# Patient Record
Sex: Female | Born: 1950 | Race: White | Hispanic: No | Marital: Married | Smoking: Former smoker
Health system: Southern US, Community
[De-identification: ages and names within clinical notes are randomized; demographics above are authoritative.]

## PROBLEM LIST (undated history)

## (undated) DIAGNOSIS — T4145XA Adverse effect of unspecified anesthetic, initial encounter: Secondary | ICD-10-CM

## (undated) DIAGNOSIS — F419 Anxiety disorder, unspecified: Secondary | ICD-10-CM

## (undated) DIAGNOSIS — M7580 Other shoulder lesions, unspecified shoulder: Secondary | ICD-10-CM

## (undated) DIAGNOSIS — F32A Depression, unspecified: Secondary | ICD-10-CM

## (undated) DIAGNOSIS — M1990 Unspecified osteoarthritis, unspecified site: Secondary | ICD-10-CM

## (undated) DIAGNOSIS — N6019 Diffuse cystic mastopathy of unspecified breast: Secondary | ICD-10-CM

## (undated) DIAGNOSIS — G4730 Sleep apnea, unspecified: Secondary | ICD-10-CM

## (undated) DIAGNOSIS — H269 Unspecified cataract: Secondary | ICD-10-CM

## (undated) DIAGNOSIS — F329 Major depressive disorder, single episode, unspecified: Secondary | ICD-10-CM

## (undated) DIAGNOSIS — G4733 Obstructive sleep apnea (adult) (pediatric): Secondary | ICD-10-CM

## (undated) DIAGNOSIS — T7840XA Allergy, unspecified, initial encounter: Secondary | ICD-10-CM

## (undated) DIAGNOSIS — T8859XA Other complications of anesthesia, initial encounter: Secondary | ICD-10-CM

## (undated) DIAGNOSIS — K279 Peptic ulcer, site unspecified, unspecified as acute or chronic, without hemorrhage or perforation: Secondary | ICD-10-CM

## (undated) DIAGNOSIS — D649 Anemia, unspecified: Secondary | ICD-10-CM

## (undated) DIAGNOSIS — K219 Gastro-esophageal reflux disease without esophagitis: Secondary | ICD-10-CM

## (undated) DIAGNOSIS — M2669 Other specified disorders of temporomandibular joint: Secondary | ICD-10-CM

## (undated) HISTORY — DX: Unspecified osteoarthritis, unspecified site: M19.90

## (undated) HISTORY — PX: EYE SURGERY: SHX253

## (undated) HISTORY — PX: HAMMER TOE SURGERY: SHX385

## (undated) HISTORY — DX: Peptic ulcer, site unspecified, unspecified as acute or chronic, without hemorrhage or perforation: K27.9

## (undated) HISTORY — PX: SHOULDER SURGERY: SHX246

## (undated) HISTORY — DX: Unspecified cataract: H26.9

## (undated) HISTORY — DX: Major depressive disorder, single episode, unspecified: F32.9

## (undated) HISTORY — PX: DILATION AND CURETTAGE OF UTERUS: SHX78

## (undated) HISTORY — DX: Allergy, unspecified, initial encounter: T78.40XA

## (undated) HISTORY — PX: PUBOVAGINAL SLING: SHX1035

## (undated) HISTORY — DX: Anxiety disorder, unspecified: F41.9

## (undated) HISTORY — PX: WRIST SURGERY: SHX841

## (undated) HISTORY — DX: Other specified disorders of temporomandibular joint: M26.69

## (undated) HISTORY — DX: Obstructive sleep apnea (adult) (pediatric): G47.33

## (undated) HISTORY — PX: ADENOIDECTOMY: SUR15

## (undated) HISTORY — DX: Diffuse cystic mastopathy of unspecified breast: N60.19

## (undated) HISTORY — DX: Sleep apnea, unspecified: G47.30

## (undated) HISTORY — DX: Gastro-esophageal reflux disease without esophagitis: K21.9

## (undated) HISTORY — PX: TONSILLECTOMY: SUR1361

## (undated) HISTORY — PX: TUBAL LIGATION: SHX77

## (undated) HISTORY — DX: Depression, unspecified: F32.A

## (undated) HISTORY — PX: BREAST CYST ASPIRATION: SHX578

## (undated) HISTORY — DX: Other shoulder lesions, unspecified shoulder: M75.80

---

## 1982-04-21 HISTORY — PX: RHINOPLASTY: SUR1284

## 1995-04-22 HISTORY — PX: TURBINATE REDUCTION: SHX6157

## 2001-02-05 ENCOUNTER — Other Ambulatory Visit: Admission: RE | Admit: 2001-02-05 | Discharge: 2001-02-05 | Payer: Self-pay | Admitting: *Deleted

## 2001-04-01 ENCOUNTER — Ambulatory Visit (HOSPITAL_COMMUNITY): Admission: RE | Admit: 2001-04-01 | Discharge: 2001-04-01 | Payer: Self-pay | Admitting: *Deleted

## 2001-04-01 ENCOUNTER — Encounter (INDEPENDENT_AMBULATORY_CARE_PROVIDER_SITE_OTHER): Payer: Self-pay

## 2001-04-21 HISTORY — PX: ABDOMINAL HYSTERECTOMY: SHX81

## 2001-05-19 ENCOUNTER — Ambulatory Visit (HOSPITAL_COMMUNITY): Admission: RE | Admit: 2001-05-19 | Discharge: 2001-05-19 | Payer: Self-pay | Admitting: *Deleted

## 2001-05-19 ENCOUNTER — Encounter: Payer: Self-pay | Admitting: *Deleted

## 2001-10-27 ENCOUNTER — Observation Stay (HOSPITAL_COMMUNITY): Admission: RE | Admit: 2001-10-27 | Discharge: 2001-10-28 | Payer: Self-pay | Admitting: Obstetrics and Gynecology

## 2001-10-27 ENCOUNTER — Encounter (INDEPENDENT_AMBULATORY_CARE_PROVIDER_SITE_OTHER): Payer: Self-pay

## 2002-05-25 ENCOUNTER — Encounter: Payer: Self-pay | Admitting: Obstetrics and Gynecology

## 2002-05-25 ENCOUNTER — Ambulatory Visit (HOSPITAL_COMMUNITY): Admission: RE | Admit: 2002-05-25 | Discharge: 2002-05-25 | Payer: Self-pay | Admitting: Obstetrics and Gynecology

## 2003-01-29 ENCOUNTER — Emergency Department (HOSPITAL_COMMUNITY): Admission: EM | Admit: 2003-01-29 | Discharge: 2003-01-29 | Payer: Self-pay | Admitting: Emergency Medicine

## 2003-01-29 ENCOUNTER — Encounter: Payer: Self-pay | Admitting: Emergency Medicine

## 2003-04-05 ENCOUNTER — Encounter: Payer: Self-pay | Admitting: Gastroenterology

## 2003-05-29 ENCOUNTER — Ambulatory Visit (HOSPITAL_COMMUNITY): Admission: RE | Admit: 2003-05-29 | Discharge: 2003-05-29 | Payer: Self-pay | Admitting: Obstetrics and Gynecology

## 2003-06-02 ENCOUNTER — Encounter: Admission: RE | Admit: 2003-06-02 | Discharge: 2003-06-02 | Payer: Self-pay | Admitting: Obstetrics and Gynecology

## 2004-06-25 ENCOUNTER — Encounter: Admission: RE | Admit: 2004-06-25 | Discharge: 2004-06-25 | Payer: Self-pay | Admitting: Obstetrics and Gynecology

## 2004-12-09 ENCOUNTER — Encounter: Payer: Self-pay | Admitting: Internal Medicine

## 2005-01-20 ENCOUNTER — Encounter: Payer: Self-pay | Admitting: Internal Medicine

## 2005-03-11 ENCOUNTER — Encounter: Payer: Self-pay | Admitting: Internal Medicine

## 2005-05-05 ENCOUNTER — Encounter: Payer: Self-pay | Admitting: Internal Medicine

## 2005-05-09 ENCOUNTER — Encounter: Payer: Self-pay | Admitting: Internal Medicine

## 2005-05-26 ENCOUNTER — Encounter: Payer: Self-pay | Admitting: Internal Medicine

## 2005-10-06 ENCOUNTER — Encounter: Payer: Self-pay | Admitting: Internal Medicine

## 2006-02-17 ENCOUNTER — Encounter: Payer: Self-pay | Admitting: Internal Medicine

## 2006-07-31 ENCOUNTER — Encounter: Payer: Self-pay | Admitting: Internal Medicine

## 2006-08-04 ENCOUNTER — Encounter: Payer: Self-pay | Admitting: Internal Medicine

## 2006-08-06 ENCOUNTER — Encounter: Payer: Self-pay | Admitting: Internal Medicine

## 2006-08-19 ENCOUNTER — Encounter: Payer: Self-pay | Admitting: Internal Medicine

## 2006-08-26 ENCOUNTER — Encounter: Payer: Self-pay | Admitting: Internal Medicine

## 2006-10-22 ENCOUNTER — Encounter: Payer: Self-pay | Admitting: Internal Medicine

## 2006-12-25 ENCOUNTER — Ambulatory Visit: Payer: Self-pay | Admitting: Internal Medicine

## 2007-03-01 ENCOUNTER — Encounter: Payer: Self-pay | Admitting: Internal Medicine

## 2007-03-03 ENCOUNTER — Encounter: Payer: Self-pay | Admitting: Internal Medicine

## 2007-03-10 HISTORY — PX: COLONOSCOPY: SHX174

## 2007-04-05 ENCOUNTER — Encounter: Payer: Self-pay | Admitting: Internal Medicine

## 2007-04-12 ENCOUNTER — Encounter: Payer: Self-pay | Admitting: Internal Medicine

## 2007-05-07 ENCOUNTER — Encounter: Payer: Self-pay | Admitting: Internal Medicine

## 2007-05-13 ENCOUNTER — Encounter: Payer: Self-pay | Admitting: Internal Medicine

## 2007-06-09 ENCOUNTER — Encounter: Admission: RE | Admit: 2007-06-09 | Discharge: 2007-06-09 | Payer: Self-pay | Admitting: Obstetrics and Gynecology

## 2007-07-12 ENCOUNTER — Telehealth (INDEPENDENT_AMBULATORY_CARE_PROVIDER_SITE_OTHER): Payer: Self-pay | Admitting: *Deleted

## 2007-07-12 ENCOUNTER — Ambulatory Visit: Payer: Self-pay | Admitting: Endocrinology

## 2007-07-12 DIAGNOSIS — B029 Zoster without complications: Secondary | ICD-10-CM | POA: Insufficient documentation

## 2007-09-21 ENCOUNTER — Telehealth (INDEPENDENT_AMBULATORY_CARE_PROVIDER_SITE_OTHER): Payer: Self-pay | Admitting: *Deleted

## 2007-10-05 ENCOUNTER — Ambulatory Visit: Payer: Self-pay | Admitting: Internal Medicine

## 2007-10-05 DIAGNOSIS — F32A Depression, unspecified: Secondary | ICD-10-CM | POA: Insufficient documentation

## 2007-10-05 DIAGNOSIS — K279 Peptic ulcer, site unspecified, unspecified as acute or chronic, without hemorrhage or perforation: Secondary | ICD-10-CM | POA: Insufficient documentation

## 2007-10-05 DIAGNOSIS — F329 Major depressive disorder, single episode, unspecified: Secondary | ICD-10-CM | POA: Insufficient documentation

## 2007-10-05 DIAGNOSIS — J45909 Unspecified asthma, uncomplicated: Secondary | ICD-10-CM | POA: Insufficient documentation

## 2007-10-05 DIAGNOSIS — J301 Allergic rhinitis due to pollen: Secondary | ICD-10-CM | POA: Insufficient documentation

## 2007-10-05 DIAGNOSIS — F419 Anxiety disorder, unspecified: Secondary | ICD-10-CM | POA: Insufficient documentation

## 2007-12-26 ENCOUNTER — Encounter: Payer: Self-pay | Admitting: Internal Medicine

## 2008-06-12 ENCOUNTER — Encounter: Admission: RE | Admit: 2008-06-12 | Discharge: 2008-06-12 | Payer: Self-pay | Admitting: Obstetrics and Gynecology

## 2008-08-16 ENCOUNTER — Ambulatory Visit: Payer: Self-pay | Admitting: Internal Medicine

## 2008-08-23 ENCOUNTER — Telehealth (INDEPENDENT_AMBULATORY_CARE_PROVIDER_SITE_OTHER): Payer: Self-pay | Admitting: *Deleted

## 2008-08-24 ENCOUNTER — Ambulatory Visit: Payer: Self-pay | Admitting: Internal Medicine

## 2009-01-23 ENCOUNTER — Encounter: Payer: Self-pay | Admitting: Internal Medicine

## 2009-02-23 ENCOUNTER — Ambulatory Visit: Payer: Self-pay | Admitting: Internal Medicine

## 2009-02-23 DIAGNOSIS — G44219 Episodic tension-type headache, not intractable: Secondary | ICD-10-CM | POA: Insufficient documentation

## 2009-03-19 ENCOUNTER — Ambulatory Visit: Payer: Self-pay | Admitting: Internal Medicine

## 2009-03-26 ENCOUNTER — Ambulatory Visit: Payer: Self-pay | Admitting: Internal Medicine

## 2009-06-14 ENCOUNTER — Encounter: Admission: RE | Admit: 2009-06-14 | Discharge: 2009-06-14 | Payer: Self-pay | Admitting: Obstetrics and Gynecology

## 2009-06-16 ENCOUNTER — Encounter: Admission: RE | Admit: 2009-06-16 | Discharge: 2009-06-16 | Payer: Self-pay | Admitting: Orthopaedic Surgery

## 2009-06-20 ENCOUNTER — Encounter: Admission: RE | Admit: 2009-06-20 | Discharge: 2009-06-20 | Payer: Self-pay | Admitting: Orthopaedic Surgery

## 2009-10-10 ENCOUNTER — Telehealth (INDEPENDENT_AMBULATORY_CARE_PROVIDER_SITE_OTHER): Payer: Self-pay | Admitting: *Deleted

## 2009-10-31 ENCOUNTER — Ambulatory Visit: Payer: Self-pay | Admitting: Internal Medicine

## 2009-10-31 LAB — CONVERTED CEMR LAB
ALT: 21 units/L (ref 0–35)
Albumin: 4.1 g/dL (ref 3.5–5.2)
Basophils Absolute: 0 10*3/uL (ref 0.0–0.1)
Bilirubin Urine: NEGATIVE
Bilirubin, Direct: 0.1 mg/dL (ref 0.0–0.3)
CO2: 30 meq/L (ref 19–32)
Chloride: 105 meq/L (ref 96–112)
Creatinine, Ser: 0.7 mg/dL (ref 0.4–1.2)
Eosinophils Relative: 2 % (ref 0.0–5.0)
Glucose, Bld: 98 mg/dL (ref 70–99)
Hemoglobin, Urine: NEGATIVE
Iron: 50 ug/dL (ref 42–145)
Ketones, ur: NEGATIVE mg/dL
LDL Cholesterol: 133 mg/dL — ABNORMAL HIGH (ref 0–99)
Leukocytes, UA: NEGATIVE
MCV: 79.9 fL (ref 78.0–100.0)
Monocytes Relative: 7.4 % (ref 3.0–12.0)
Neutro Abs: 3.2 10*3/uL (ref 1.4–7.7)
Neutrophils Relative %: 58.6 % (ref 43.0–77.0)
Nitrite: NEGATIVE
Platelets: 285 10*3/uL (ref 150.0–400.0)
Potassium: 4.5 meq/L (ref 3.5–5.1)
Sodium: 143 meq/L (ref 135–145)
TSH: 2.07 microintl units/mL (ref 0.35–5.50)
Total Bilirubin: 0.7 mg/dL (ref 0.3–1.2)
Total CHOL/HDL Ratio: 5
Total Protein, Urine: NEGATIVE mg/dL
Transferrin: 293.9 mg/dL (ref 212.0–360.0)
Urine Glucose: NEGATIVE mg/dL
Urobilinogen, UA: 0.2 (ref 0.0–1.0)
VLDL: 24 mg/dL (ref 0.0–40.0)
Vitamin B-12: 557 pg/mL (ref 211–911)
WBC: 5.5 10*3/uL (ref 4.5–10.5)

## 2009-11-01 ENCOUNTER — Encounter: Payer: Self-pay | Admitting: Internal Medicine

## 2009-11-01 LAB — CONVERTED CEMR LAB: Retic Ct Pct: 1.1 % (ref 0.4–3.1)

## 2009-11-05 ENCOUNTER — Encounter: Payer: Self-pay | Admitting: Internal Medicine

## 2009-12-25 ENCOUNTER — Telehealth: Payer: Self-pay | Admitting: Internal Medicine

## 2010-01-10 ENCOUNTER — Ambulatory Visit: Payer: Self-pay | Admitting: Internal Medicine

## 2010-01-10 DIAGNOSIS — D649 Anemia, unspecified: Secondary | ICD-10-CM | POA: Insufficient documentation

## 2010-04-21 HISTORY — PX: ROTATOR CUFF REPAIR: SHX139

## 2010-05-20 ENCOUNTER — Other Ambulatory Visit: Payer: Self-pay | Admitting: Obstetrics & Gynecology

## 2010-05-20 DIAGNOSIS — Z1239 Encounter for other screening for malignant neoplasm of breast: Secondary | ICD-10-CM

## 2010-05-21 NOTE — Assessment & Plan Note (Signed)
Summary: PHYSICAL---STC   Vital Signs:  Patient profile:   60 year old female Height:      68 inches Weight:      195 pounds BMI:     29.76 O2 Sat:      98 % on Room air Temp:     98.5 degrees F oral Pulse rate:   70 / minute BP sitting:   128 / 88  (left arm) Cuff size:   large  Vitals Entered By: Bill Salinas CMA (January 10, 2010 1:50 PM)  O2 Flow:  Room air CC: cpx/ ab Comments Pt will get flu shot today/ ab   Primary Care Provider:  Norins  CC:  cpx/ ab.  History of Present Illness: Patient presents for general medical follow-up. She is having a problem with alopecia.   She recently lost her mother to small-cell cancer lung. Diagnosed in 04/05/23 and died in December 06, 2022. This has been very hard to bear.  She is current with her gynecologist and is up to date with mammograms and bone density studies. She has chronic anemia currently with hGB 11.4, % Fe sat of 14%. She is current with colonoscopy with last study in '08. she has intermittently taken iron but finds it constipating.   Current Medications (verified): 1)  Multivitamins   Tabs (Multiple Vitamin) .... Take 1 Tablet By Mouth Once A Day 2)  Caltrate 600+d 600-400 Mg-Unit  Tabs (Calcium Carbonate-Vitamin D) .... Take 1 Tablet By Mouth Once A Day 3)  Fluticasone Propionate 50 Mcg/act  Susp (Fluticasone Propionate) .Marland Kitchen.. 1 Spray/nares Once Daily 4)  Effexor Xr 75 Mg Xr24h-Cap (Venlafaxine Hcl) .... Take 3 By Mouth Qd 5)  Allegra 60 Mg Tabs (Fexofenadine Hcl) .Marland Kitchen.. 1 Tab Daily 6)  Ambien Cr 12.5 Mg Cr-Tabs (Zolpidem Tartrate) .Marland Kitchen.. 1 Tablet At Bedtime As Needed  Allergies (verified): 1)  ! Pcn  Past History:  Past Medical History: Last updated: 10/05/2007 UCD Allergic rhinitis Asthma Peptic ulcer disease shoulder spurs - left w/ chronic pain, w/ limitation ROM Anxiety/depression fibrocystic breast disease    Physician Roster:               Gyn - Dickstein               Ortho  - Whifield                GI -  Medoff               Psych - Plovsky, therapist - Ms. Newman Pies  Past Surgical History: Last updated: 10/05/2007 Tonsillectomy x 2 Adenoidectomy  x 3 Hysterectomy '03 due to fibroids Tubal ligation Pubo-vaginal sling for incontinence '90's Turbinate reductions '97 Rhinoplasty '84   G6 P2 2 SAB,2 TAB (one late due to [facility])  Family History: Last updated: 10/05/2007 father-1925: CAD, Lipids, Prostate cancer, testicular cancer, lipids mother - 1932: AVR '94, osteoporosis, fibroids Neg- breast cancer, colon cancer PGF, MGM - DM MGF, PGM - CAD  Social History: Last updated: 03/19/2009 [employer] 2 years married '82 2 daughters - '86, '92 work: Designer, 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    Physical Exam  General:  WNWD white female in no physical distress but emotionally distraught Head:  normocephalic, atraumatic, and no abnormalities observed.   Eyes:  vision grossly intact, pupils equal, pupils round, corneas and lenses clear, and no injection.   Ears:  External ear exam shows no significant lesions or deformities.  Otoscopic examination reveals clear canals, tympanic membranes are intact bilaterally without bulging, retraction, inflammation or discharge. Hearing is grossly normal bilaterally. Nose:  no external deformity and no external erythema.   Mouth:  Oral mucosa and oropharynx without lesions or exudates.  Teeth  in good repair. Neck:  supple, full ROM, no thyromegaly, and no carotid bruits.   Chest Wall:  no deformities.   Breasts:  deferred to gyn Lungs:  Normal respiratory effort, chest expands symmetrically. Lungs are clear to auscultation, no crackles or wheezes. Heart:  Normal rate and regular rhythm. S1 and S2 normal without gallop, murmur, click, rub or other extra sounds. Abdomen:  soft, non-tender, and normal bowel sounds.   Genitalia:  deferred to gyn Msk:  normal ROM, no joint tenderness, no joint swelling, no joint warmth, and no joint deformities.   Pulses:  2+ radial and DP pulses Extremities:  No clubbing, cyanosis, edema, or deformity noted with normal full range of motion of all joints.   Neurologic:  alert & oriented X3, cranial nerves II-XII intact, gait normal, and DTRs symmetrical and normal.   Skin:  turgor normal, color normal, and no suspicious lesions.   Cervical Nodes:  no anterior cervical adenopathy and no posterior cervical adenopathy.   Psych:  Oriented X3, memory intact for recent and remote, normally interactive, good eye contact, and tearful.     Impression & Recommendations:  Problem # 1:  ANXIETY DEPRESSION (ICD-300.4) Patient has recently lost her mother and was her primary care-give through a difficult illness and death. She is in the throes of a normal grief reaction. She reports that she is not seeing a therapist. She is continuing to take Effexor.  Plan - grief and loss counseling. She will contact me if she needs help finding a therapist to help her.   Problem # 2:  ASTHMA (ICD-493.90) [place].  Problem # 3:  ANEMIA (ICD-285.9) Mild anemia with Hgb 11.4. Retic count is normal. B12 is normal. Total iron is normal at 50 but percent iron saturation is low at 12.2% with normal being greater than 20%. She had a normal colonsocopy in '08  Plan - iron rich diet - provided reference to [email or web address].  Problem # 4:  Preventive Health Care  (ICD-V70.0) Current with her gynecologist. Her limited exam is normal. Lab results are normal except for mild anemia and and LDL 3 points above goal of 130. Cardiac risk calculator gives a 10 year risk of cardicac event of 2%. Current with colonoscopy and mammography. Immunizations are brought up to date.  In summary - a very nice woman who appears generally healthy. We did discuss weight management as a critical factor in her health. Reveiwed the principles of smart food choices, PORTION SIZE CONTROL, regular exercise. Set a target weight of 160 labs and a goal of loosing 1 lb/month. "Weight Watchers" is endorsed if she prefers a more structured but similar approach.  She will contact me if assistance is needed in locating a grief and loss counselor. She will otherwise return as needed or 1 year.   Complete Medication List: 1)  Multivitamins Tabs (  Multiple vitamin) .... Take 1 tablet by mouth once a day 2)  Caltrate 600+d 600-400 Mg-unit Tabs (Calcium carbonate-vitamin d) .... Take 1 tablet by mouth once a day 3)  Fluticasone Propionate 50 Mcg/act Susp (Fluticasone propionate) .Marland Kitchen.. 1 spray/nares once daily 4)  Effexor Xr 75 Mg Xr24h-cap (Venlafaxine hcl) .... Take 3 by mouth qd 5)  Allegra 60 Mg Tabs (Fexofenadine hcl) .Marland Kitchen.. 1 tab daily 6)  Ambien Cr 12.5 Mg Cr-tabs (Zolpidem tartrate) .Marland Kitchen.. 1 tablet at bedtime as needed  Other Orders: Admin 1st Vaccine (04540) Flu Vaccine 54yrs + (98119)   Patient: Chelsey Jackson Note: All result statuses are Final unless otherwise noted.  Tests: (1) BMP (METABOL)   Sodium                    143 mEq/L                   135-145   Potassium                 4.5 mEq/L                   3.5-5.1   Chloride                  105 mEq/L                   96-112   Carbon Dioxide            30 mEq/L                    19-32   Glucose                   98 mg/dL                    14-78   BUN                       21 mg/dL                    2-95   Creatinine                 0.7 mg/dL                   6.2-1.3   Calcium                   9.0 mg/dL                   0.8-65.7   GFR                       87.37 mL/min                >60  Tests: (2) Lipid Panel (LIPID)   Cholesterol               196 mg/dL                   8-469     ATP III Classification            Desirable:  < 200 mg/dL                    Borderline High:  200 - 239 mg/dL  High:  > = 240 mg/dL   Triglycerides             120.0 mg/dL                 4.7-829.5     Normal:  <150 mg/dL     Borderline High:  621 - 199 mg/dL   HDL                       30.86 mg/dL                 >57.84   VLDL Cholesterol          24.0 mg/dL                  6.9-62.9   LDL Cholesterol      [H]  528 mg/dL                   4-13  CHO/HDL Ratio:  CHD Risk                             5                    Men          Women     1/2 Average Risk     3.4          3.3     Average Risk          5.0          4.4     2X Average Risk          9.6          7.1     3X Average Risk          15.0          11.0                           Tests: (3) CBC Platelet w/Diff (CBCD)   White Cell Count          5.5 K/uL                    4.5-10.5   Red Cell Count            4.20 Mil/uL                 3.87-5.11   Hemoglobin           [L]  11.4 g/dL                   24.4-01.0   Hematocrit           [L]  33.5 %                      36.0-46.0   MCV                       79.9 fl                     78.0-100.0   MCHC                      34.0 g/dL  30.0-36.0   RDW                  [H]  14.8 %                      11.5-14.6   Platelet Count            285.0 K/uL                  150.0-400.0   Neutrophil %              58.6 %                      43.0-77.0   Lymphocyte %              31.8 %                      12.0-46.0   Monocyte %                7.4 %                       3.0-12.0   Eosinophils%              2.0 %                       0.0-5.0   Basophils %               0.2 %                        0.0-3.0   Neutrophill Absolute      3.2 K/uL                    1.4-7.7   Lymphocyte Absolute       1.7 K/uL                    0.7-4.0   Monocyte Absolute         0.4 K/uL                    0.1-1.0  Eosinophils, Absolute                             0.1 K/uL                    0.0-0.7   Basophils Absolute        0.0 K/uL                    0.0-0.1  Tests: (4) Hepatic/Liver Function Panel (HEPATIC)   Total Bilirubin           0.7 mg/dL                   1.6-1.0   Direct Bilirubin          0.1 mg/dL                   9.6-0.4   Alkaline Phosphatase      45 U/L                      39-117   AST  21 U/L                      0-37   ALT                       21 U/L                      0-35   Total Protein             6.7 g/dL                    8.1-1.9   Albumin                   4.1 g/dL                    1.4-7.8  Tests: (5) TSH (TSH)   FastTSH                   2.07 uIU/mL                 0.35-5.50  Tests: (6) UDip Only (UDIP)   Color                     YELLOW       RANGE:  Yellow;Lt. Yellow   Clarity                   CLEAR                       Clear   Specific Gravity          1.025                       1.000 - 1.030   Urine Ph                  6.5                         5.0-8.0   Protein                   NEGATIVE                    Negative   Urine Glucose             NEGATIVE                    Negative   Ketones                   NEGATIVE                    Negative   Urine Bilirubin           NEGATIVE                    Negative   Blood                     NEGATIVE                    Negative   Urobilinogen              0.2  0.0 - 1.0   Leukocyte Esterace        NEGATIVE                    Negative   Nitrite                   NEGATIVE                    Negative  Tests: (7) T4, Free (FT4R)   Free T4                   0.89 ng/dL                  0.60-1.60  Tests: (8) T3 Uptake (T3UP)   T3 Uptake                 36.4 %                       22.5-37.0  Tests: (9) IBC Panel (IBC)   Iron                      50 ug/dL                    16-109   Transferrin               293.9 mg/dL                 604.5-409.8   Iron Saturation      [L]  12.2 %                      20.0-50.0  Tests: (10) B12 Serum - Total ONLY (B12)   Vitamin B12               557 pg/mL                   211-911 Tests: (1) Reticulocyte Count (RETIC) (10050)   % Retic                   1.1 %                       0.4-3.1   RBC                       4.29 MIL/uL                 3.87-5.11   ABS Retic                 47.2 K/uL                   19.0-186.0 Prevention & Chronic Care Immunizations   Influenza vaccine: Fluvax 3+  (01/10/2010)    Tetanus booster: 03/26/2009: Tdap    Pneumococcal vaccine: Not documented  Colorectal Screening   Hemoccult: Not documented    Colonoscopy: Normal  (03/10/2007)  Other Screening   Pap smear: Normal  (05/07/2007)    Mammogram: Normal Bilateral  (06/09/2007)   Smoking status: never  (08/16/2008)  Lipids   Total Cholesterol: 196  (10/31/2009)   LDL: 133  (10/31/2009)   LDL Direct: Not documented   HDL: 39.10  (10/31/2009)   Triglycerides: 120.0  (10/31/2009)  Flu Vaccine Consent Questions     Do you have a history of  severe allergic reactions to this vaccine? no    Any prior history of allergic reactions to egg and/or gelatin? no    Do you have a sensitivity to the preservative Thimersol? no    Do you have a past history of Guillan-Barre Syndrome? no    Do you currently have an acute febrile illness? no    Have you ever had a severe reaction to latex? no    Vaccine information given and explained to patient? yes    Are you currently pregnant? no    Lot Number:[id number]   Exp Date:10/19/2010   Site Given  Left Deltoid IM.lbflu

## 2010-05-21 NOTE — Progress Notes (Signed)
Summary: LABS?   Phone Note Call from Patient Call back at Home Phone [phone] Call back at [facility] VM ON HM #   Summary of Call: Patient is requesting to know if she needs labs?  Initial call taken by: Lamar Sprinkles, CMA,  December 25, 2009 3:06 PM  Follow-up for Phone Call        Just had labs in July. She was supposed to have CPX in July. Was sent a letter about lab results. She was to reschedule cpx Follow-up by: Jacques Navy MD,  December 25, 2009 5:42 PM  Additional Follow-up for Phone Call Additional follow up Details #1::        Pt is scheduled for office visit 9/22 - will need to keep apt, no labs prior.  Additional Follow-up by: Lamar Sprinkles, CMA,  December 25, 2009 5:44 PM    Additional Follow-up for Phone Call Additional follow up Details #2::    Pt informed  Follow-up by: Lamar Sprinkles, CMA,  December 27, 2009 10:08 AM

## 2010-05-21 NOTE — Letter (Signed)
[facility] [address] [place], [place]  [address] Phone: [phone]      November 05, 2009   Chelsey Jackson [address] [place], [place] [address]  RE:  LAB RESULTS  Dear  Chelsey Jackson,  The following is an interpretation of your most recent lab tests.  Please take note of any instructions provided or changes to medications that have resulted from your lab work.  ELECTROLYTES:  Good - no changes needed  KIDNEY FUNCTION TESTS:  Good - no changes needed  LIVER FUNCTION TESTS:  Good - no changes needed  LIPID PANEL:  Stable - no changes needed Triglyceride: 120.0   Cholesterol: 196   LDL: 133   HDL: 39.10   Chol/HDL%:  5  THYROID STUDIES:  Thyroid studies normal TSH: 2.07     DIABETIC STUDIES:  Excellent - no changes needed Blood Glucose: 98    CBC:  Stable - no changes needed  B12 was normal. Retic count is normal.   Lab results look OK. I hope that you will reschedule your appointment for your medical exam.  Call or e-mail me if you have questions ([email or web address]).   Sincerely Yours,    Jacques Navy MD  Patient: Chelsey Jackson Note: All result statuses are Final unless otherwise noted.  Tests: (1) BMP (METABOL)   Sodium                    143 mEq/L                   135-145   Potassium                 4.5 mEq/L                   3.5-5.1   Chloride                  105 mEq/L                   96-112   Carbon Dioxide            30 mEq/L                    19-32   Glucose                   98 mg/dL                    46-96   BUN                       21 mg/dL                    2-95   Creatinine                0.7 mg/dL                   2.8-4.1   Calcium                   9.0 mg/dL                   3.2-44.0   GFR                       87.37 mL/min                >  60  Tests: (2) Lipid Panel (LIPID)   Cholesterol               196 mg/dL                   1-610     ATP III Classification            Desirable:  < 200 mg/dL                     Borderline High:  200 - 239 mg/dL               High:  > = 240 mg/dL   Triglycerides             120.0 mg/dL                 9.6-045.4     Normal:  <150 mg/dL     Borderline High:  098 - 199 mg/dL   HDL                       11.91 mg/dL                 >47.82   VLDL Cholesterol          24.0 mg/dL                  9.5-62.1   LDL Cholesterol      [H]  308 mg/dL                   6-57  CHO/HDL Ratio:  CHD Risk                             5                    Men          Women     1/2 Average Risk     3.4          3.3     Average Risk          5.0          4.4     2X Average Risk          9.6          7.1     3X Average Risk          15.0          11.0                           Tests: (3) CBC Platelet w/Diff (CBCD)   White Cell Count          5.5 K/uL                    4.5-10.5   Red Cell Count            4.20 Mil/uL                 3.87-5.11   Hemoglobin           [L]  11.4 g/dL                   84.6-96.2   Hematocrit           [L]  33.5 %  36.0-46.0   MCV                       79.9 fl                     78.0-100.0   MCHC                      34.0 g/dL                   19.1-47.8   RDW                  [H]  14.8 %                      11.5-14.6   Platelet Count            285.0 K/uL                  150.0-400.0   Neutrophil %              58.6 %                      43.0-77.0   Lymphocyte %              31.8 %                      12.0-46.0   Monocyte %                7.4 %                       3.0-12.0   Eosinophils%              2.0 %                       0.0-5.0   Basophils %               0.2 %                       0.0-3.0   Neutrophill Absolute      3.2 K/uL                    1.4-7.7   Lymphocyte Absolute       1.7 K/uL                    0.7-4.0   Monocyte Absolute         0.4 K/uL                    0.1-1.0  Eosinophils, Absolute                             0.1 K/uL                    0.0-0.7   Basophils Absolute        0.0 K/uL                     0.0-0.1  Tests: (4) Hepatic/Liver Function Panel (HEPATIC)   Total Bilirubin           0.7 mg/dL  0.3-1.2   Direct Bilirubin          0.1 mg/dL                   8.2-9.5   Alkaline Phosphatase      45 U/L                      39-117   AST                       21 U/L                      0-37   ALT                       21 U/L                      0-35   Total Protein             6.7 g/dL                    6.2-1.3   Albumin                   4.1 g/dL                    0.8-6.5  Tests: (5) TSH (TSH)   FastTSH                   2.07 uIU/mL                 0.35-5.50  Tests: (6) UDip Only (UDIP)   Color                     YELLOW       RANGE:  Yellow;Lt. Yellow   Clarity                   CLEAR                       Clear   Specific Gravity          1.025                       1.000 - 1.030   Urine Ph                  6.5                         5.0-8.0   Protein                   NEGATIVE                    Negative   Urine Glucose             NEGATIVE                    Negative   Ketones                   NEGATIVE                    Negative   Urine Bilirubin           NEGATIVE  Negative   Blood                     NEGATIVE                    Negative   Urobilinogen              0.2                         0.0 - 1.0   Leukocyte Esterace        NEGATIVE                    Negative   Nitrite                   NEGATIVE                    Negative  Tests: (7) T4, Free (FT4R)   Free T4                   0.89 ng/dL                  0.60-1.60  Tests: (8) T3 Uptake (T3UP)   T3 Uptake                 36.4 %                      22.5-37.0  Tests: (9) IBC Panel (IBC)   Iron                      50 ug/dL                    09-811   Transferrin               293.9 mg/dL                 914.7-829.5   Iron Saturation      [L]  12.2 %                      20.0-50.0  Tests: (10) B12 Serum - Total ONLY (B12)   Vitamin B12               557  pg/mL                   211-911 Tests: (1) Reticulocyte Count (RETIC) (10050)   % Retic                   1.1 %                       0.4-3.1   RBC                       4.29 MIL/uL                 3.87-5.11   ABS Retic                 47.2 K/uL                   19.0-186.0

## 2010-05-21 NOTE — Progress Notes (Signed)
Summary: Low iron/CPX needed  Phone Note Call from Patient Call back at Work Phone [phone]   Summary of Call: Spoke with patient and she has been spending quite some time with her mother and is afraid that some family issues are related to her issues, Patient also notes that she has a terribly low iron level per the [facility] and is due for physical. Patient was advised to schedule CPX and transferred to scheduling. Initial call taken by: Lucious Groves,  October 10, 2009 10:06 AM  Follow-up for Phone Call        Pt requesting labs "full" thyroid bloodwork, will CPX labs include all thyroid labs? Pt coming in July for CPX and labs, please advise. Follow-up by: Verdell Face,  October 10, 2009 10:08 AM  Additional Follow-up for Phone Call Additional follow up Details #1::        routine cpx labs plus: iron panel, B12, retic count 285.9; FT4, T3U - 780.9 Thanks Additional Follow-up by: Jacques Navy MD,  October 10, 2009 10:24 AM    Additional Follow-up for Phone Call Additional follow up Details #2::    labs stated above added to lab orders in IDX. Follow-up by: Verdell Face,  October 10, 2009 10:52 AM

## 2010-06-13 ENCOUNTER — Encounter: Payer: Self-pay | Admitting: Internal Medicine

## 2010-06-20 ENCOUNTER — Ambulatory Visit
Admission: RE | Admit: 2010-06-20 | Discharge: 2010-06-20 | Disposition: A | Discharge status: Home / Self Care | Payer: BC Managed Care – PPO | Source: Ambulatory Visit | Attending: Obstetrics & Gynecology | Admitting: Obstetrics & Gynecology

## 2010-06-20 DIAGNOSIS — Z1239 Encounter for other screening for malignant neoplasm of breast: Secondary | ICD-10-CM

## 2010-07-02 NOTE — Letter (Signed)
Summary: [facility]  [facility]   Imported By: Sherian Rein 06/25/2010 14:24:44  _____________________________________________________________________  External Attachment:    Type:   Image     Comment:   External Document

## 2010-07-15 ENCOUNTER — Ambulatory Visit (INDEPENDENT_AMBULATORY_CARE_PROVIDER_SITE_OTHER)
Admission: RE | Admit: 2010-07-15 | Discharge: 2010-07-15 | Disposition: A | Discharge status: Home / Self Care | Payer: BC Managed Care – PPO | Source: Ambulatory Visit | Attending: Internal Medicine | Admitting: Internal Medicine

## 2010-07-15 ENCOUNTER — Ambulatory Visit (INDEPENDENT_AMBULATORY_CARE_PROVIDER_SITE_OTHER): Payer: BC Managed Care – PPO | Admitting: Internal Medicine

## 2010-07-15 ENCOUNTER — Encounter: Payer: Self-pay | Admitting: Internal Medicine

## 2010-07-15 VITALS — BP 110/72 | HR 86 | Temp 97.9°F

## 2010-07-15 DIAGNOSIS — J209 Acute bronchitis, unspecified: Secondary | ICD-10-CM

## 2010-07-15 DIAGNOSIS — R05 Cough: Secondary | ICD-10-CM

## 2010-07-15 DIAGNOSIS — R059 Cough, unspecified: Secondary | ICD-10-CM

## 2010-07-15 MED ORDER — PSEUDOEPH-CHLORPHEN-HYDROCOD 60-4-5 MG/5ML PO SOLN: 5.0000 mL | Freq: Four times a day (QID) | ORAL | Status: DC | PRN

## 2010-07-15 MED ORDER — MOXIFLOXACIN HCL 400 MG PO TABS: 400.0000 mg | ORAL_TABLET | Freq: Every day | ORAL | Status: AC

## 2010-07-15 NOTE — Progress Notes (Signed)
Subjective:     Chelsey Jackson is a 60 y.o. female who presents for evaluation of symptoms of a URI, possible sinusitis. Symptoms include congestion, facial pain, low grade fever, productive cough with  yellow colored sputum, purulent nasal discharge, sinus pressure and sore throat. Onset of symptoms was 4 days ago, and has been unchanged since that time. Treatment to date: nasal steroids.  The following portions of the patient's history were reviewed and updated as appropriate: allergies, current medications, past family history, past medical history, past social history, past surgical history and problem list.  Review of Systems Constitutional: positive for chills, fevers and malaise, negative for night sweats and weight loss Ears, nose, mouth, throat, and face: positive for nasal congestion and sore mouth, negative for ear drainage, earaches, epistaxis, facial trauma, hearing loss, hoarseness and tinnitus Respiratory: positive for cough, negative for dyspnea on exertion, hemoptysis, pleurisy/chest pain, stridor and wheezing   Objective:    BP 110/72  Pulse 86  Temp(Src) 97.9 F (36.6 C) (Oral)  SpO2 97%  General Appearance:    Alert, cooperative, no distress, appears stated age  Head:    Normocephalic, without obvious abnormality, atraumatic  Eyes:    PERRL, conjunctiva/corneas clear, EOM's intact, fundi    benign, both eyes  Ears:    Normal TM's and external ear canals, both ears  Nose:   Nares normal, septum midline, mucosa normal, no drainage    or sinus tenderness  Throat:   Lips, mucosa, and tongue normal; teeth and gums normal  Neck:   Supple, symmetrical, trachea midline, no adenopathy;    thyroid:  no enlargement/tenderness/nodules; no carotid   bruit or JVD  Back:     Symmetric, no curvature, ROM normal, no CVA tenderness  Lungs:     Clear to auscultation bilaterally, respirations unlabored  Chest Wall:    No tenderness or deformity   Heart:    Regular rate and rhythm,  S1 and S2 normal, no murmur, rub   or gallop  Breast Exam:    No tenderness, masses, or nipple abnormality  Abdomen:     Soft, non-tender, bowel sounds active all four quadrants,    no masses, no organomegaly        Extremities:   Extremities normal, atraumatic, no cyanosis or edema  Pulses:   2+ and symmetric all extremities  Skin:   Skin color, texture, turgor normal, no rashes or lesions  Lymph nodes:   Cervical, supraclavicular, and axillary nodes normal  Neurologic:   CNII-XII intact, normal strength, sensation and reflexes    throughout     Assessment:    bronchitis and viral upper respiratory illness   Plan:  Start avelox, control symptoms, get rest, report any new or worsening symptoms

## 2010-07-15 NOTE — Patient Instructions (Signed)
Acute Bronchitis You have acute bronchitis. This means you have a chest cold. The airways in your lungs are inflamed (red and sore). Acute means it is sudden onset. Bronchitis is most often caused by a virus. In smokers, people with chronic lung problems, and elderly patients, treatment with antibiotics for bacterial infection may be needed. Exposure to cigarette smoke or irritating chemicals will make bronchitis worse. Allergies and asthma can also make bronchitis worse. Repeated episodes of bronchitis may cause long standing lung problems. Acute bronchitis is usually treated with rest, fluids, and medicines for relief of fever or cough. Bronchodilator medicines from metered inhalers or a nebulizer may be used to help open up the small airways. This reduces shortness of breath and helps control cough. Antibiotics can be prescribed if you are more seriously ill or at risk. A cool air vaporizer may help thin bronchial secretions and make it easier to clear your chest. Increased fluids may also help. You must avoid smoking, even second hand exposure. If you are a cigarette smoker, consider using nicotine gum or skin patches to help control withdrawal symptoms. Recovery from bronchitis is often slow, but you should start feeling better after 2-3 days. Cough from bronchitis frequently lasts for 3-4 weeks.  SEEK IMMEDIATE MEDICAL CARE IF YOU DEVELOP:  Increased fever, chills, or chest pain.   Severe shortness of breath or bloody sputum.   Dehydration, fainting, repeated vomiting, severe headache.   No improvement after one week of proper treatment.  MAKE SURE YOU:   Understand these instructions.   Will watch your condition.   Will get help right away if you are not doing well or get worse.  Document Released: 05/15/2004 Document Re-Released: 03/20/2008 [employer] Patient Information 2011 [employer]. 

## 2010-08-02 ENCOUNTER — Telehealth: Payer: Self-pay | Admitting: *Deleted

## 2010-08-02 DIAGNOSIS — J069 Acute upper respiratory infection, unspecified: Secondary | ICD-10-CM

## 2010-08-02 MED ORDER — MOXIFLOXACIN HCL 400 MG PO TABS: 400.0000 mg | ORAL_TABLET | Freq: Every day | ORAL | Status: AC

## 2010-08-02 NOTE — Telephone Encounter (Signed)
Pt was given abx recently and scheduled f/u ov next week. However, she feels that she is "slipping back" into getting sick again and is afraid to wait thru the weekend w/o additional antibiotics.

## 2010-08-02 NOTE — Telephone Encounter (Signed)
Pt aware of rx

## 2010-08-02 NOTE — Telephone Encounter (Signed)
done

## 2010-08-08 ENCOUNTER — Other Ambulatory Visit (INDEPENDENT_AMBULATORY_CARE_PROVIDER_SITE_OTHER): Payer: BC Managed Care – PPO

## 2010-08-08 ENCOUNTER — Ambulatory Visit (INDEPENDENT_AMBULATORY_CARE_PROVIDER_SITE_OTHER): Payer: BC Managed Care – PPO | Admitting: Internal Medicine

## 2010-08-08 VITALS — BP 110/72 | HR 79 | Temp 98.0°F

## 2010-08-08 DIAGNOSIS — R197 Diarrhea, unspecified: Secondary | ICD-10-CM

## 2010-08-08 LAB — CBC WITH DIFFERENTIAL/PLATELET
Basophils Relative: 0.1 % (ref 0.0–3.0)
HCT: 36.8 % (ref 36.0–46.0)
Hemoglobin: 12.6 g/dL (ref 12.0–15.0)
Monocytes Relative: 7.9 % (ref 3.0–12.0)
Neutro Abs: 2.9 10*3/uL (ref 1.4–7.7)
WBC: 4.6 10*3/uL (ref 4.5–10.5)

## 2010-08-08 NOTE — Progress Notes (Signed)
  Subjective:    Patient ID: Chelsey Jackson, female    DOB: 1950/11/02, 60 y.o.   MRN: [id number]  HPIPatient recently seen by Dr. Yetta Barre and diagnosed with bronchitis after negative chest x-ray. Notes and x-ray reviewed. She was treated with avelox 400mg  qd x 10, cod syrup. She felt better but then started to feel worse. Another round of avelox was called in but she continues to feel bad: feels feverish, has a cough, generalized sense of illness. ON questioning admits to loose watery stools, sometimes with mucus and mild abdominal pain.   PMH, FamHx and SocHx reviewed for any changes and relevance.     Review of Systems     Objective:   Physical Exam WNWD white female in no distress HEENT - TMs normal, throat clear. Chest - CTAP, no egophony or petriloquy, no wheezing or rales. Abd - hypoactive BS, tender to deep palpation LLQ.       Assessment & Plan:  1. Bronchitis - resolved. 2. Post-infectious cought  Plan - continue cough syrup and soothing things  3. Diarrhea - concern for anti-biotic related c.diff.  Plan - CBC, stool for C. Diff toxin. If positive - Flagyl.

## 2010-08-08 NOTE — Patient Instructions (Signed)
Respiratory infection - no evidence of on-going respiratory infection: Ears, throat and lungs all normal. No large lymph-nodes. Stop the Avelox. OK to use cough syrup of choice for a post-infectious cough. Also soothing things: hot teas, cough drops, etc.  Stool change, fever and abdominal tenderness all raise concern for possible anti-biotic associated colon infection = C. Diff colitis Plan - lab work and stool analysis. If positive for c. Diff will call in Rx for Flagyl.  Clostridium Difficile Diarrhea (CDD)   Clostridium difficile (C. diff) is a bacteria found in the intestinal tract (bowel). Under certain conditions, it causes diarrhea and sometimes severe disease. The severe form of the disease is known as pseudomembranous colitis (often called C.diff colitis).   CAUSES Your colon normally has many different bacteria, including C. diff. The balance of bacteria in your colon can change during illness. This is especially true when you take antibiotics. Taking antibiotics may allow the C. diff to grow, multiply excessively and make a toxin that then causes the illness.   SYMPTOMS  Watery diarrhea.   Fever.   Loss of appetite.   Nausea.   Cramping.   Abdominal pain/tenderness.   Sudden need to have a bowel movement.   Bloody diarrhea.  DIAGNOSIS   Your description of diarrhea may make your caregiver suspicious of a C. diff illness. This is especially true if you have used antibiotics in the preceding weeks. However, there are many causes of diarrhea, and treatments differ greatly. There are only 2 ways to know whether or not you have C.diff or C.diff colitis. Your caregiver will help determine if these tests are necessary:  A laboratory test that finds the toxin in your stool.   A specific appearance of an abnormality (pseudomembrane) in your colon. This can only be seen by doing a sigmoidoscopy or colonoscopy (an instrument is passed through your rectum to look at the inside of your  colon).  PREVENTION   C.diff and C.diff colitis are almost always related to antibiotic use. Careful hand washing by you and care providers is important to prevent the spread of infection. You will notice in the hospital that your caregivers may also put on gowns and gloves to prevent the spread of the C. diff bacteria. In addition, your room is regularly cleaned with a hospital grade disinfectant.   PROGNOSIS   Most people with C.diff recover with the right treatment. Relapses following treatment do occur.   This can be a deadly disease if untreated and if you also have other severe diseases at the same time.   RISKS AND COMPLICATIONS   Dehydration and electrolyte imbalance can occur with ongoing diarrhea. Rarely, the colon can develop a hole (perforate) with this disease. A condition called toxic megacolon is a very severe complication.   TREATMENT  Most people are successfully treated with 1 of 2 specific antibiotics usually given by mouth. Other antibiotics you are receiving are stopped if possible.   IV fluids and correction of electrolyte imbalance may be necessary.   Medications to slow down or stop the diarrhea are not used.   If this illness becomes persistent or very recurrent, treatment can be difficult. Rarely, bacteria from a healthy donor is inserted into the colon for treatment.  HOME CARE INSTRUCTIONS  Drink plenty of fluids. AVOID milk, caffeine, and alcohol.   Eat a BRAT diet to try to slow the diarrhea (banana, rice, applesauce, toast). Small, frequent meals may be better than large meals.   Take antibiotic medication  as prescribed. DO NOT use medications to slow the diarrhea. This could delay healing or cause complications.   Wash your hands thoroughly after using the toilet.  SEEK MEDICAL CARE IF:  Diarrhea persists longer than expected or recurs after completing your course of antibiotic treatment.   You have trouble staying hydrated.  SEEK IMMEDIATE MEDICAL CARE  IF:  You develop a new fever.   You develop increasing abdominal pain or tenderness.   You develop blood in the stool, or the stool is dark black and tar-like.   You cannot hold down food or liquids.  MAKE SURE YOU:    Understand these instructions.   Will watch your condition.   Will get help right away if you are not doing well or get worse.  Document Released: 01/15/2005 Document Re-Released: 08/24/2008 [facility] Patient Information 2011 [place], [place].

## 2010-08-09 ENCOUNTER — Telehealth: Payer: Self-pay | Admitting: Internal Medicine

## 2010-08-09 NOTE — Telephone Encounter (Signed)
Called pt and informed her of results. Pt states she feel much better since stopping the antibiotic. She has not done a stool specimen because she has been unable to use the restroom

## 2010-08-09 NOTE — Telephone Encounter (Signed)
Please call patient - CBC is normal, no evidence of active respiratory infection. Stool for c. Diff is still pending. I hope she feels better.

## 2010-08-12 ENCOUNTER — Other Ambulatory Visit: Payer: BC Managed Care – PPO

## 2010-08-12 DIAGNOSIS — R197 Diarrhea, unspecified: Secondary | ICD-10-CM

## 2010-08-16 ENCOUNTER — Telehealth: Payer: Self-pay | Admitting: Internal Medicine

## 2010-08-16 DIAGNOSIS — R197 Diarrhea, unspecified: Secondary | ICD-10-CM

## 2010-08-16 NOTE — Telephone Encounter (Signed)
Please call patient - stool negative for c. Diff.

## 2010-08-19 NOTE — Telephone Encounter (Signed)
Pt states she still has diarrhea, she is going to the bathroom 3 to 4 times a day. She states she gets bouts of chill and feels very fatigued.

## 2010-08-20 ENCOUNTER — Ambulatory Visit (INDEPENDENT_AMBULATORY_CARE_PROVIDER_SITE_OTHER): Payer: BC Managed Care – PPO | Admitting: Internal Medicine

## 2010-08-20 ENCOUNTER — Other Ambulatory Visit: Payer: BC Managed Care – PPO

## 2010-08-20 VITALS — BP 110/72 | HR 67 | Temp 98.7°F

## 2010-08-20 DIAGNOSIS — R197 Diarrhea, unspecified: Secondary | ICD-10-CM

## 2010-08-20 MED ORDER — METRONIDAZOLE 500 MG PO TABS: 500.0000 mg | ORAL_TABLET | Freq: Three times a day (TID) | ORAL | Status: AC

## 2010-08-20 NOTE — Telephone Encounter (Signed)
Repeat stool culutre for C.Diff.

## 2010-08-20 NOTE — Progress Notes (Signed)
  Subjective:    Patient ID: Chelsey Jackson, female    DOB: 1950-08-10, 60 y.o.   MRN: [id number]  HPI Chelsey Jackson continues to have 3-4 loose, greenish stools per day. She has a heightened gastro-colic reflex and subsequently has cut way back on eating. She is have light-headness that comes in waves and she is having sweats. No documented fever, no significant abdominal pain. NO  Blood or mucus in the stool. She had one stool specimen negative for c.diff. She did have two courses of Avelox prior to diarrhea.  I have reviewed the patient's medical history in detail and updated the computerized patient record.  Review of Systems Review of Systems  Constitutional:  Negative for fever, chills, activity change and unexpected weight change.  HENT:  Negative for hearing loss, ear pain, congestion, neck stiffness and postnasal drip.   Eyes: Negative for pain, discharge and visual disturbance.  Respiratory: Negative for chest tightness and wheezing.   Cardiovascular: Negative for chest pain and palpitations.       [No decreased exercise tolerance Gastrointestinal: [No change in bowel habit. No bloating or gas. No reflux or indigestion Genitourinary: Negative for urgency, frequency, flank pain and difficulty urinating.  Musculoskeletal: Negative for myalgias, back pain, arthralgias and gait problem.  Neurological: Negative for dizziness, tremors, weakness and headaches.  Hematological: Negative for adenopathy.  Psychiatric/Behavioral: Negative for behavioral problems and dysphoric mood.      Objective:   Physical Exam WNWD white woman in no acute distress Orthostatic vitals: 118/78 supine; 116/80 sitting; 116/80 standing Abdomen - soft, no guarding or rebound, no marked tenderness        Assessment & Plan:  1. Diarrhea - persistent diarrhea. Working diagnosis remains c. Diff despite a single negative stool.  Plan - 2nd stool culture for d.diff           Flagyl 500mg  tid x 10 days       Probiotics.

## 2010-08-26 ENCOUNTER — Other Ambulatory Visit: Payer: BC Managed Care – PPO

## 2010-08-26 DIAGNOSIS — R197 Diarrhea, unspecified: Secondary | ICD-10-CM

## 2010-08-30 LAB — CLOSTRIDIUM DIFFICILE EIA: CDIFTX: NEGATIVE

## 2010-09-02 ENCOUNTER — Telehealth: Payer: Self-pay | Admitting: *Deleted

## 2010-09-02 MED ORDER — DIPHENOXYLATE-ATROPINE 2.5-0.025 MG PO TABS: 1.0000 | ORAL_TABLET | Freq: Four times a day (QID) | ORAL | Status: AC | PRN

## 2010-09-02 NOTE — Telephone Encounter (Signed)
Both negative for C.diff. May start taking lomotil 1 after each loose stool with limit of 4 doses in 24 hrs. Rx sent in to pharmacy

## 2010-09-02 NOTE — Telephone Encounter (Signed)
Patient requesting results of second stool culture. She continues to have "bowel issues". Results are neg correct?

## 2010-09-02 NOTE — Telephone Encounter (Signed)
Called in RX, Patient informed

## 2010-09-06 NOTE — Op Note (Signed)
[facility]  Patient:    Chelsey Jackson, Chelsey Jackson Visit Number: [id number] MRN: [id number]          Service Type: DSU Location: [facility] Attending Physician:  Ardeen Fillers Dictated by:   Sung Amabile. Roslyn Smiling, M.D. Proc. Date: 04/01/01 Admit Date:  04/01/2001                             Operative Report  INDICATIONS:                  A 60 year old woman with significant menorrhagia and endometrial mass on ultrasound admitted for operative hysteroscopy.  PREOPERATIVE DIAGNOSES:       Menorrhagia, endometrial mass on ultrasound.  POSTOPERATIVE DIAGNOSES:      Menorrhagia, endometrial mass.  PROCEDURE:                    Operative hysteroscopy and dilatation and curettage.  SURGEON:                      Sung Amabile. Roslyn Smiling, M.D.  ANESTHESIA:                   General anesthesia via LMA and paracervical block.  ESTIMATED BLOOD LOSS:         50 cc.  TUBES AND DRAINS:             None.  COMPLICATIONS:                None.  FINDINGS:                     Uterus top normal size, anteverted, sounded to 10 cm.  Posterior wall broad based endometrial mass, resected.  SPECIMEN:                     Hysteroscopic endometrial biopsies and endometrial curettings to pathology.  PROCEDURE:                    After the establishment of general anesthesia the patient was placed in the dorsal lithotomy position.  The perineum and vagina were prepped with Betadine solution.  The bladder was evacuated with straight catheterization.  The patient was draped.  Examination under anesthesia was carried out for the above findings.  Graves speculum was inserted in the vagina.  The cervix was reprepped with Betadine solution.  The anterior cervical lip was infiltrated with 1% Nesacaine.  It was then grasped with a single tooth tenaculum.  Paracervical block was placed in the usual fashion using 1% Nesacaine (20 cc).  The uterus was sounded to 10 cm.  Pratt dilators were used to  serially dilate the cervix to a #35 [place].  The hysteroscope was introduced easily into the endometrial cavity. Photographs were taken.  The above findings were noted.  Sorbitol was the distending medium.  Instillation pressure ranged between 50-80 mmHg during the case.  Using a 90 degree double loop hysteroscopic resecting loop with settings of 190 and 110, cutting and coagulation respectively, the posterior broad based endometrial mass was resected.  Pieces were removed individually. Photographs were taken.  After the mass was resected the scope was removed and gentle sharp curettage was performed.  Instruments were completely removed and hemostasis was noted.  The patient was returned to the supine position, extubated without difficulty, and transported to the recovery room in satisfactory condition.  Sorbitol deficit at the end of the case was 70 cc. Dictated by:   Sung Amabile Roslyn Smiling, M.D. Attending Physician:  Ardeen Fillers DD:  04/01/01 TD:  04/01/01 Job: [id number]

## 2010-09-06 NOTE — H&P (Signed)
[facility]  Patient:    Chelsey Jackson, Chelsey Jackson Visit Number: [id number] MRN: [id number]          Service Type: Attending:  Sung Amabile. Roslyn Smiling, M.D. Dictated by:   Sung Amabile Roslyn Smiling, M.D. Adm. Date:  04/01/01                           History and Physical  DATE OF BIRTH:                1950/07/02  CHIEF COMPLAINT:              Menorrhagia, endometrial mass on ultrasound.  HISTORY OF PRESENT ILLNESS:   A 60 year old woman gravida 6, para 2-0-4-2, status post tubal ligation in 1992, who was first seen at [facility] on February 05, 2001, with complaints of flooding q.month of bleeding more than seven days for the past two or so years.  The patient has been anemic in the past, but takes iron.  Pelvic ultrasound was performed on March 04, 2001, and revealed multiple fibroids and endometrial thickness of 1.8 cm with a hyperechoic mass measuring 1.8 x 1.1.  Because of menorrhagia and ultrasound findings, hysteroscopy and D&C have been recommended.  PAST MEDICAL HISTORY:         Asthma and depression.  OBSTETRICAL:                  Vaginal deliveries in 1992 and 1986.  PAST SURGICAL HISTORY:        Tubal ligation in 1992, pubovaginal sling, adenoidectomy in 1997, laparoscopy age 61.  ALLERGIES:                    PENICILLIN causes anaphylaxis.  MEDICATIONS:                  Allegra, Singulair, Zoloft, multivitamin, iron, vitamin E, and calcium.  FAMILY HISTORY:               Father status post MI, history of testicular cancer, and colon polyps.  Mother abdominal aortic aneurysm.  Sister with gastrointestinal reflux.  Child with asthma and allergies.  SOCIAL HISTORY:               Married.  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  Former smoker (30-pack-years history).  Stopped 11 years ago.  Uses alcohol socially.  PHYSICAL EXAMINATION:  GENERAL:                      Intelligent woman, moderately overweight.  VITAL SIGNS:                   Blood pressure 110/80.  HEENT:                        Within normal limits.  NECK:                         Without thyromegaly.  CHEST:                        Clear.  COR:                          Regular rate and rhythm, S1 and S2 normal.  BREASTS:  Without mass, tenderness, axillary or supraclavicular nodes.  ABDOMEN:                      Soft and nontender without organomegaly, mass, or hernia.  Well-healed subumbilical scar.  BACK:                         Without CVAT.  GU:                           External genitalia BUS.  Vagina without lesions. Cervix without lesion.  Uterus anteverted, top normal size, nontender, and mobile.  Adnexa normal to palpation.  Rectovaginal exam confirmatory.  SKIN:                         Without lesions.  NEUROLOGICAL:                 Grossly intact.  No lymphadenopathy.  EXTREMITIES:                  Without CCE.  LABORATORY DATA:              Pap smear on February 05, 2001, showed benign reactive ________ changes.  ASSESSMENT:                   1. Menorrhagia - rule out endometrial pathology.                               2. Endometrial mass on ultrasound - rule out                                  endometrial abnormality.                               3. Fibroids.  PLAN:                         Operative hysteroscopy and D&C.  No antibiotic prophylaxis given.  Anaphylaxis to penicillin.  The patient has been counseled regarding the benefits, risks, options, and expected outcome of the procedure prior to surgery.  Questions have been answered. Dictated by:   Sung Amabile Roslyn Smiling, M.D. Attending:  Sung Amabile. Roslyn Smiling, M.D. DD:  03/29/01 TD:  03/29/01 Job: [id number]

## 2010-09-06 NOTE — Op Note (Signed)
[facility]  Patient:    Chelsey Jackson, Chelsey Jackson Visit Number: [id number] MRN: [id number]          Service Type: DSU Location: [id number] Attending Physician:  Morene Antu Dictated by:   Sherry A. Rosalio Macadamia, M.D. Proc. Date: 10/27/01 Admit Date:  10/27/2001 Discharge Date: 10/28/2001                             Operative Report  PREOPERATIVE DIAGNOSIS:       Menorrhagia, fibroid uterus.  POSTOPERATIVE DIAGNOSES:      Menorrhagia, fibroid uterus.  PROCEDURE:                    Laparoscopically assisted vaginal hysterectomy.  SURGEON:                      Sherry A. Rosalio Macadamia, M.D.  ANESTHESIA:                   General  ASSISTANT:                    Marina Gravel, M.D.  INDICATIONS:                  This is a 60 year old G2, P2-0-0-2 woman who has her menstrual periods every 26 to 36 days, with excessively heavy flow.  The patient changes a pad and tampon every one to 1-1/2 hr.  The patients periods last approximately seven days.  The patient has had a previous D&C/hysteroscopy, with resectoscopic excision of an endometrial polyp, with simple hyperplasia present.  The patient is treated with progesterone; however, her periods did not improve.  Repeat endometrial biopsy in April showed benign endometrium.  The patient has known fibroid uterus, that has increased in size.  She was attempted to be treated with birth control pills; however, this also did not decrease her flow.  Because of these problems, the patient is brought to the operating room for an LAVH.  FINDINGS:                     12+ week size uterus, with multiple fibroids present.  Normal fallopian tubes and ovaries.  DESCRIPTION OF PROCEDURE:     The patient was brought into the operating room and given adequate general anesthesia.  She was placed in the dorsal lithotomy position.  Her abdomen and vagina were washed with Hibiclens; a Foley catheter was placed in the  bladder.  A speculum was placed within the vagina.  A single-tooth Hulka tenaculum  was placed in the endocervix.  The patient was draped in the usual sterile fashion.  A subumbilical incision was made after infiltrating with 0.25% Marcaine.  The incision was brought down to the fascia.  The fascia was grasped with Hulka clamps and the fascia was incised. The peritoneum was identified and opened.  A pursestring stitch was placed in the fascia with 0 Vicryl, and a Hasson trocar was introduced into the peritoneal cavity and cinched down around with the pursestring stitch.  The peritoneal cavity was insufflated with carbon dioxide.  Under direct visualization two lateral mid abdominal incisions were made, after placing 0.25% Marcaine.  Then 5 mm trocars were introduced under direct visualization.  The pelvis was inspected; it was felt that a LAVH could be performed.  The utero-ovarian ligaments were identified and they were cauterized in three spaces and  cut between for each cauterized area.  The round ligaments were cauterized and cut.  The tissues were brought down along the lateral walls of the uterus bilaterally.  The bladder peritoneum was identified and a small incision made.  Using ________ it was hydrodissected and the anterior leaf of the broad ligament across the bladder peritoneum was incised.  Small bleeders were cauterized.  Some of the adventitial tissue was dissected gently off of the lower uterine segment and cervix.  The cautery was performed to just above the uterine arteries.  The ureters had been well visualized well below the cauterized area.  There was minimal bleeding through this part of the surgery.  The vaginal portion of the surgery was then performed.  Weighted speculum was placed within the vagina.  The cervix was grasped with two Brooke Pace tenaculums, after having been removed by Hulka tenaculum.  The cervix was infiltrated with 1% Xylocaine with epinephrine  circumferentially. The posterior cul-de-sac was identified and it was entered sharply.  The vaginal mucosa was incised circumferentially around the cervix.  Uterosacral ligaments were clamped, cut and suture ligated with 0 Vicryl Ligasure.  The cardinal ligaments were clamped with cautery (Quasar); this was used in several different areas.  However, it was felt it was not adequately cauterizing, therefore the instruments were switched to the Ligasure. Ligasure was used on alternating sides along the cardinal ligaments.  The anterior tissues were dissected off of the cervix and the peritoneal space was identified.  The remaining cardinal ligaments were able to be clamped with the Ligasure cauterizing and cut along the left side of the uterus.  However, it was very difficult to get up the right side of the uterus.  Therefore, the decision was made to morcellate the cervix with uterus, and remove the fibroids.  The cervix was incised and excised; approximately four fibroids were removed by performing a vaginal myomectomy.  Once this was done, the remaining portion of the right cardinal ligament could be identified.  It was grasped with a Ligasure, cauterized and cut.  Any bleeders that were present along the cardinal ligaments were identified and closed with 0 Vicryl figure-of-eight stitches.  The posterior portion of the vaginal cuff was incised in a V-shape along the vaginal mucosa, removing some excess tissue. The posterior vaginal edges were then closed with 0 Vicryl in a running locked whipped stitch.  Once adequate hemostasis was obtained along the vaginal edges, the vagina was closed in a perpendicular fashion using 0 Vicryl figure-of-eight stitches.  Adequate hemostasis was present.  The surgeons gloves were changed, and the laparoscope as re-introduced into the peritoneal cavity.  Pictures were obtained.  The abdominal cavity was irrigated with warm saline.  There was no significant  bleeding present. Pictures were obtained.  All irrigation was removed.  All carbon dioxide was allowed to escape.  The trocars were removed under direct visualization.  The  laparoscope and Hasson sleeve were removed under direct visualization.  The fascia was then closed with a pursestring stitch.  The skin incision and subumbilical area was closed with 4-0 Monocryl in a subcuticular stitch.  All incisions were closed with Dermabond.  The patient was taken out of the dorsal lithotomy position.  She was awakened and extubated.  She was removed from the operating table to the stretcher in stable condition.  No complications.  ESTIMATED BLOOD LOSS:         400 cc. Dictated by:   Sherry A. Rosalio Macadamia, M.D. Attending Physician:  Rosalio Macadamia,  Aleen Campi DD:  10/29/01 TD:  11/01/01 Job: [id number]

## 2011-04-22 HISTORY — PX: ROTATOR CUFF REPAIR: SHX139

## 2011-05-13 ENCOUNTER — Ambulatory Visit (INDEPENDENT_AMBULATORY_CARE_PROVIDER_SITE_OTHER): Payer: BC Managed Care – PPO

## 2011-05-13 DIAGNOSIS — N390 Urinary tract infection, site not specified: Secondary | ICD-10-CM

## 2011-06-16 ENCOUNTER — Other Ambulatory Visit (INDEPENDENT_AMBULATORY_CARE_PROVIDER_SITE_OTHER): Payer: BC Managed Care – PPO

## 2011-06-16 ENCOUNTER — Encounter: Payer: Self-pay | Admitting: Internal Medicine

## 2011-06-16 ENCOUNTER — Ambulatory Visit (INDEPENDENT_AMBULATORY_CARE_PROVIDER_SITE_OTHER): Payer: BC Managed Care – PPO | Admitting: Internal Medicine

## 2011-06-16 VITALS — BP 104/70 | HR 91 | Temp 98.2°F | Resp 16 | Ht 67.5 in

## 2011-06-16 DIAGNOSIS — D649 Anemia, unspecified: Secondary | ICD-10-CM

## 2011-06-16 DIAGNOSIS — E041 Nontoxic single thyroid nodule: Secondary | ICD-10-CM

## 2011-06-16 DIAGNOSIS — F341 Dysthymic disorder: Secondary | ICD-10-CM

## 2011-06-16 DIAGNOSIS — Z0000 Encounter for general adult medical examination without abnormal findings: Secondary | ICD-10-CM

## 2011-06-16 DIAGNOSIS — K279 Peptic ulcer, site unspecified, unspecified as acute or chronic, without hemorrhage or perforation: Secondary | ICD-10-CM

## 2011-06-16 DIAGNOSIS — J45909 Unspecified asthma, uncomplicated: Secondary | ICD-10-CM

## 2011-06-16 LAB — COMPREHENSIVE METABOLIC PANEL
AST: 29 U/L (ref 0–37)
Alkaline Phosphatase: 68 U/L (ref 39–117)
BUN: 21 mg/dL (ref 6–23)
Creatinine, Ser: 0.9 mg/dL (ref 0.4–1.2)
Glucose, Bld: 100 mg/dL — ABNORMAL HIGH (ref 70–99)
Total Bilirubin: 0.3 mg/dL (ref 0.3–1.2)

## 2011-06-16 LAB — CBC WITH DIFFERENTIAL/PLATELET
Basophils Relative: 0.5 % (ref 0.0–3.0)
Eosinophils Relative: 1.8 % (ref 0.0–5.0)
HCT: 33.3 % — ABNORMAL LOW (ref 36.0–46.0)
Lymphs Abs: 1.6 10*3/uL (ref 0.7–4.0)
MCV: 75.6 fl — ABNORMAL LOW (ref 78.0–100.0)
Monocytes Absolute: 0.6 10*3/uL (ref 0.1–1.0)
Monocytes Relative: 8.7 % (ref 3.0–12.0)
RBC: 4.41 Mil/uL (ref 3.87–5.11)
WBC: 6.5 10*3/uL (ref 4.5–10.5)

## 2011-06-16 LAB — TSH: TSH: 1.36 u[IU]/mL (ref 0.35–5.50)
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
## 2011-06-17 DIAGNOSIS — Z0000 Encounter for general adult medical examination without abnormal findings: Secondary | ICD-10-CM | POA: Insufficient documentation

## 2011-06-17 NOTE — Assessment & Plan Note (Signed)
Prior Hgb 12.3 now 10.7. No sign of blood loss. This is listed as a chronic problem. Had normal colonoscopy in '09.  Plan - follow up lab in 3 months: Hgb/Hct, iron studies, B12 studies, retic count

## 2011-06-17 NOTE — Assessment & Plan Note (Signed)
Stable with no active symptoms. 

## 2011-06-17 NOTE — Assessment & Plan Note (Signed)
Interval medical history - benign. Physical exam, sans breast and pelvic, normal. Lab is fine. Current with colorectal and breast cancer screening. Current with her gynecologist. Immunization is up to date.  In summary - a very nice woman who is medically stable. She will return as needed or in 18-24 months for complete physical.

## 2011-06-17 NOTE — Assessment & Plan Note (Signed)
Stable with no complaint of GI pain.
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
## 2011-06-19 ENCOUNTER — Ambulatory Visit
Admission: RE | Admit: 2011-06-19 | Discharge: 2011-06-19 | Disposition: A | Discharge status: Home / Self Care | Payer: BC Managed Care – PPO | Source: Ambulatory Visit | Attending: Internal Medicine | Admitting: Internal Medicine

## 2011-06-19 DIAGNOSIS — E041 Nontoxic single thyroid nodule: Secondary | ICD-10-CM

## 2011-06-21 ENCOUNTER — Ambulatory Visit (INDEPENDENT_AMBULATORY_CARE_PROVIDER_SITE_OTHER): Payer: BC Managed Care – PPO | Admitting: Internal Medicine

## 2011-06-21 VITALS — BP 133/77 | HR 81 | Temp 98.4°F | Resp 16 | Ht 67.5 in | Wt 188.0 lb

## 2011-06-21 DIAGNOSIS — K121 Other forms of stomatitis: Secondary | ICD-10-CM

## 2011-06-21 DIAGNOSIS — J029 Acute pharyngitis, unspecified: Secondary | ICD-10-CM

## 2011-06-21 MED ORDER — LIDOCAINE VISCOUS 2 % MT SOLN: OROMUCOSAL | Status: DC

## 2011-06-21 MED ORDER — CEPHALEXIN 250 MG PO CAPS: 250.0000 mg | ORAL_CAPSULE | Freq: Three times a day (TID) | ORAL | Status: AC

## 2011-06-21 NOTE — Progress Notes (Signed)
  Subjective:    Patient ID: Chelsey Jackson, female    DOB: 28-Jun-1950, 61 y.o.   MRN: [id number]  HPI3 day history of severe sore throat No fever cough or runny nose    Review of SystemsSee chart/nothing new to add      Objective:   Physical ExamVital signs stable TMs clear/nares clear Oral pharynx reveals several ulcers on the uvula and posterior pharynx Right a.c. Node is 2+ and tender Lungs are clear        Assessment & Plan:  Problem #1 pharyngitis Throat culture sent Viscous Xylocaine 1 teaspoon gargle every 2 hours as needed Keflex 250 3 times a day #30 pending throat culture Will call with results and plan

## 2011-06-23 ENCOUNTER — Other Ambulatory Visit: Payer: Self-pay | Admitting: Obstetrics & Gynecology

## 2011-06-23 ENCOUNTER — Other Ambulatory Visit: Payer: Self-pay | Admitting: Internal Medicine

## 2011-06-23 DIAGNOSIS — Z1231 Encounter for screening mammogram for malignant neoplasm of breast: Secondary | ICD-10-CM

## 2011-06-23 DIAGNOSIS — E042 Nontoxic multinodular goiter: Secondary | ICD-10-CM

## 2011-06-23 LAB — CULTURE, GROUP A STREP: Organism ID, Bacteria: NORMAL

## 2011-06-26 ENCOUNTER — Telehealth: Payer: Self-pay | Admitting: Family Medicine

## 2011-06-26 ENCOUNTER — Ambulatory Visit (INDEPENDENT_AMBULATORY_CARE_PROVIDER_SITE_OTHER): Payer: BC Managed Care – PPO | Admitting: Internal Medicine

## 2011-06-26 ENCOUNTER — Other Ambulatory Visit: Payer: Self-pay | Admitting: Physician Assistant

## 2011-06-26 VITALS — BP 128/75 | HR 80 | Temp 98.8°F | Resp 16 | Ht 67.5 in | Wt 189.8 lb

## 2011-06-26 DIAGNOSIS — J029 Acute pharyngitis, unspecified: Secondary | ICD-10-CM

## 2011-06-26 DIAGNOSIS — R070 Pain in throat: Secondary | ICD-10-CM

## 2011-06-26 MED ORDER — HYDROCODONE-ACETAMINOPHEN 7.5-500 MG/15ML PO SOLN: 5.0000 mL | Freq: Four times a day (QID) | ORAL | Status: AC | PRN

## 2011-06-26 MED ORDER — MAGIC MOUTHWASH W/LIDOCAINE: ORAL | Status: DC

## 2011-06-26 NOTE — Progress Notes (Signed)
  Subjective:    Patient ID: Chelsey Jackson, female    DOB: January 15, 1951, 61 y.o.   MRN: [id number]  HPI  ST persists, no fever and no other sxs.  Review of Systems     Objective:   Physical Exam  Throat appears normal, does have none tender thyroid cysts      Assessment & Plan:   Add Dukes Gargle and       Lortab elixir 6 oz q4hr prn

## 2011-06-26 NOTE — Telephone Encounter (Signed)
Documented Dukes Magic MW from last OV.

## 2011-06-26 NOTE — Telephone Encounter (Signed)
Rx's (Lortab elixir and Dukes Mouthwash) called into [facility] per MD.

## 2011-07-03 ENCOUNTER — Ambulatory Visit: Payer: BC Managed Care – PPO

## 2011-07-03 ENCOUNTER — Encounter (HOSPITAL_COMMUNITY)
Admission: RE | Admit: 2011-07-03 | Discharge: 2011-07-03 | Disposition: A | Discharge status: Home / Self Care | Payer: BC Managed Care – PPO | Source: Ambulatory Visit | Attending: Internal Medicine | Admitting: Internal Medicine

## 2011-07-03 DIAGNOSIS — E042 Nontoxic multinodular goiter: Secondary | ICD-10-CM

## 2011-07-03 DIAGNOSIS — E041 Nontoxic single thyroid nodule: Secondary | ICD-10-CM | POA: Insufficient documentation

## 2011-07-04 ENCOUNTER — Encounter (HOSPITAL_COMMUNITY)
Admission: RE | Admit: 2011-07-04 | Discharge: 2011-07-04 | Disposition: A | Discharge status: Home / Self Care | Payer: BC Managed Care – PPO | Source: Ambulatory Visit | Attending: Internal Medicine | Admitting: Internal Medicine

## 2011-07-04 MED ORDER — SODIUM PERTECHNETATE TC 99M INJECTION
10.9000 | Freq: Once | INTRAVENOUS | Status: AC | PRN
  Administered 2011-07-04: 10.9 via INTRAVENOUS

## 2011-07-04 MED ORDER — SODIUM IODIDE I 131 CAPSULE
9.4000 | Freq: Once | INTRAVENOUS | Status: AC | PRN
  Administered 2011-07-04: 9.4 via ORAL

## 2011-07-06 ENCOUNTER — Other Ambulatory Visit: Payer: Self-pay | Admitting: Internal Medicine

## 2011-07-06 DIAGNOSIS — E041 Nontoxic single thyroid nodule: Secondary | ICD-10-CM

## 2011-07-07 ENCOUNTER — Ambulatory Visit
Admission: RE | Admit: 2011-07-07 | Discharge: 2011-07-07 | Disposition: A | Discharge status: Home / Self Care | Payer: BC Managed Care – PPO | Source: Ambulatory Visit | Attending: Obstetrics & Gynecology | Admitting: Obstetrics & Gynecology

## 2011-07-07 DIAGNOSIS — Z1231 Encounter for screening mammogram for malignant neoplasm of breast: Secondary | ICD-10-CM

## 2011-07-09 ENCOUNTER — Ambulatory Visit
Admission: RE | Admit: 2011-07-09 | Discharge: 2011-07-09 | Disposition: A | Discharge status: Home / Self Care | Payer: BC Managed Care – PPO | Source: Ambulatory Visit | Attending: Internal Medicine | Admitting: Internal Medicine

## 2011-07-09 ENCOUNTER — Other Ambulatory Visit (HOSPITAL_COMMUNITY)
Admission: RE | Admit: 2011-07-09 | Discharge: 2011-07-09 | Disposition: A | Discharge status: Home / Self Care | Payer: BC Managed Care – PPO | Source: Ambulatory Visit | Attending: Interventional Radiology | Admitting: Interventional Radiology

## 2011-07-09 DIAGNOSIS — E041 Nontoxic single thyroid nodule: Secondary | ICD-10-CM

## 2011-07-09 DIAGNOSIS — E049 Nontoxic goiter, unspecified: Secondary | ICD-10-CM | POA: Insufficient documentation

## 2011-09-11 ENCOUNTER — Telehealth: Payer: Self-pay | Admitting: Internal Medicine

## 2011-09-11 NOTE — Telephone Encounter (Signed)
Received 10 pages from Dr. Ritta Slot, sent to Dr. Debby Bud. 09/11/11 sd.

## 2011-09-12 ENCOUNTER — Telehealth: Payer: Self-pay | Admitting: Internal Medicine

## 2011-09-12 NOTE — Telephone Encounter (Signed)
Received 10 pages of medical records from Dr. Ritta Slot, sent to Dr. Debby Bud. sd 09/12/11

## 2012-04-08 ENCOUNTER — Telehealth: Payer: Self-pay | Admitting: Internal Medicine

## 2012-04-08 NOTE — Telephone Encounter (Signed)
Forward 4 pages from [facility] to Dr. Illene Regulus for review on 04-08-12 ym

## 2012-05-31 ENCOUNTER — Other Ambulatory Visit (HOSPITAL_COMMUNITY): Payer: Self-pay | Admitting: Obstetrics & Gynecology

## 2012-05-31 DIAGNOSIS — Z1231 Encounter for screening mammogram for malignant neoplasm of breast: Secondary | ICD-10-CM

## 2012-07-07 ENCOUNTER — Ambulatory Visit
Admission: RE | Admit: 2012-07-07 | Discharge: 2012-07-07 | Disposition: A | Discharge status: Home / Self Care | Payer: BC Managed Care – PPO | Source: Ambulatory Visit | Attending: Obstetrics & Gynecology | Admitting: Obstetrics & Gynecology

## 2012-07-07 ENCOUNTER — Other Ambulatory Visit: Payer: Self-pay | Admitting: Obstetrics & Gynecology

## 2012-07-07 ENCOUNTER — Ambulatory Visit (HOSPITAL_COMMUNITY): Payer: BC Managed Care – PPO | Attending: Obstetrics & Gynecology

## 2012-07-07 DIAGNOSIS — Z1231 Encounter for screening mammogram for malignant neoplasm of breast: Secondary | ICD-10-CM

## 2012-07-09 ENCOUNTER — Other Ambulatory Visit: Payer: Self-pay | Admitting: Obstetrics & Gynecology

## 2012-07-09 DIAGNOSIS — R928 Other abnormal and inconclusive findings on diagnostic imaging of breast: Secondary | ICD-10-CM

## 2012-07-15 ENCOUNTER — Encounter: Payer: Self-pay | Admitting: Internal Medicine

## 2012-07-15 ENCOUNTER — Ambulatory Visit (INDEPENDENT_AMBULATORY_CARE_PROVIDER_SITE_OTHER): Payer: BC Managed Care – PPO | Admitting: Internal Medicine

## 2012-07-15 VITALS — BP 128/80 | HR 69 | Temp 99.2°F | Resp 12 | Ht 68.0 in

## 2012-07-15 DIAGNOSIS — E041 Nontoxic single thyroid nodule: Secondary | ICD-10-CM | POA: Insufficient documentation

## 2012-07-15 MED ORDER — AZITHROMYCIN 250 MG PO TABS: ORAL_TABLET | ORAL | Status: AC

## 2012-07-15 NOTE — Progress Notes (Signed)
Subjective:    Patient ID: Chelsey Jackson, female    DOB: 09-23-1950, 62 y.o.   MRN: [id number]  HPI Mrs. Roston presents to discuss appropriate follow up of a cold nodule that was biopsied and read out as a benign follicular nodule with adenomatous change. She has not noted any change in her neck nor any symptoms to suggest thyroid disease.  She had a recent mammogram, 3D, with a question of a possible mass in the right breast. She is for U/S follow up 3/28  She has had a minor sinus infection with pressure, drainage of purulent mucus and low grade fever.  Past Medical History  Diagnosis Date  . Allergy   . Depression   . GERD (gastroesophageal reflux disease)   . Asthma   . Anxiety   . Peptic ulcer disease   . Fibrocystic breast disease   . AC (acromioclavicular) joint bone spurs     left shoulder with chronic pain, w/limitation ROM   Past Surgical History  Procedure Laterality Date  . Tonsillectomy      x2  . Adenoidectomy      x3  . Abdominal hysterectomy  2003    due to fibroids  . Tubal ligation    . Rhinoplasty  1984  . Turbinate reduction  1997  . Pubovaginal sling  '90's    for incontinence  . Colonoscopy  03/10/2007    results-normal   Family History  Problem Relation Age of Onset  . Osteoporosis Mother   . Fibroids Mother   . Other Mother     aortic valve replacement 1994  . Heart disease Father   . Cancer Father     prostate/testicular  . Diabetes Maternal Grandmother   . Heart disease Maternal Grandfather   . Heart disease Paternal Grandmother   . Diabetes Paternal Grandfather    History   Social History  . Marital Status: Married    Spouse Name: N/A    Number of Children: N/A  . Years of Education: N/A   Occupational History  . Not on file.   Social History Main Topics  . Smoking status: Former Games developer  . Smokeless tobacco: Not on file  . Alcohol Use: Yes     Comment: maybe a glass of wine on the weekend  . Drug Use: No  . Sexually  Active: Not on file   Other Topics Concern  . 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 albuterol (PROVENTIL,VENTOLIN) 90 MCG/ACT inhaler Inhale 2 puffs into the lungs every 6 (six) hours as needed.        . ARIPiprazole (ABILIFY PO) Take by mouth daily. Dosage Unknown.      . Calcium Carbonate-Vitamin D (CALTRATE 600+D) 600-400 MG-UNIT per tablet Take 1 tablet by mouth daily.        . fexofenadine (ALLEGRA) 60 MG tablet Take 60 mg by mouth daily.        . Multiple Vitamin (MULTIVITAMIN) tablet Take 1 tablet by mouth daily.        Marland Kitchen venlafaxine (EFFEXOR-XR) 75 MG 24 hr capsule Take 75 mg by mouth daily. Take 3 tabs by mouth daily       . zolpidem (AMBIEN CR) 12.5 MG CR tablet  Take 12.5 mg by mouth at bedtime as needed.         No current facility-administered medications on file prior to visit.      Review of Systems System review is negative for any constitutional, cardiac, pulmonary, GI or neuro symptoms or complaints other than as described in the HPI.     Objective:   Physical Exam Filed Vitals:   07/15/12 1543  BP: 128/80  Pulse: 69  Temp: 99.2 F (37.3 C)  Resp: 12   gen'l- WNWD white woman in no distress HEENT - mild sinus tenderness to percussion. TM's normal, throat clear Neck- supple, easily palpated thyroid with enlargement or palpable nodules. Nodes - negative in the cervical and supraclavicular regions Cor- 2+ radial, RRR Pulm - normal respirations. Neuro - A&O x 3       Assessment & Plan:  Sinus infection - mild Plan Z-pak  Decongestant - sudafed (generic) 30 mg twice a day  Continue allegra  Take mucinex 1200 mg twice a day  Nasal saline spray continue that  Vitamin C 1500 mg daily  Ecchinacea if you are a believer.

## 2012-07-15 NOTE — Patient Instructions (Addendum)
1. Thyroid nodule - pathology was a follicular nodule with adenomatous change - NOT malignant Plan- follow up thyroid U/S  2. MAmmography - a suggestion, not a definitive call, of a mass right breast. U/S tomorrow  3. Sinus infection - mild Plan Z-pak  Decongestant - sudafed (generic) 30 mg twice a day  Continue allegra  Take mucinex 1200 mg twice a day  Nasal saline spray continue that  Vitamin C 1500 mg daily  Ecchinacea if you are a believer.

## 2012-07-16 ENCOUNTER — Ambulatory Visit
Admission: RE | Admit: 2012-07-16 | Discharge: 2012-07-16 | Disposition: A | Discharge status: Home / Self Care | Payer: BC Managed Care – PPO | Source: Ambulatory Visit | Attending: Obstetrics & Gynecology | Admitting: Obstetrics & Gynecology

## 2012-07-16 DIAGNOSIS — R928 Other abnormal and inconclusive findings on diagnostic imaging of breast: Secondary | ICD-10-CM

## 2012-07-18 NOTE — Assessment & Plan Note (Signed)
H/o adenomatous thyroid nodule with FNA in March '13. Per UpToDate recommendation is follow up U/S at 6-18 months, if > 20% increase in any dimension - repeat FNA.  Plan Schedule Thyroid u/s

## 2012-07-19 ENCOUNTER — Ambulatory Visit
Admission: RE | Admit: 2012-07-19 | Discharge: 2012-07-19 | Disposition: A | Discharge status: Home / Self Care | Payer: BC Managed Care – PPO | Source: Ambulatory Visit | Attending: Internal Medicine | Admitting: Internal Medicine

## 2012-07-19 DIAGNOSIS — E041 Nontoxic single thyroid nodule: Secondary | ICD-10-CM

## 2012-07-29 ENCOUNTER — Other Ambulatory Visit: Payer: Self-pay | Admitting: Obstetrics & Gynecology

## 2012-07-29 DIAGNOSIS — Z780 Asymptomatic menopausal state: Secondary | ICD-10-CM

## 2012-08-05 ENCOUNTER — Ambulatory Visit
Admission: RE | Admit: 2012-08-05 | Discharge: 2012-08-05 | Disposition: A | Discharge status: Home / Self Care | Payer: BC Managed Care – PPO | Source: Ambulatory Visit | Attending: Obstetrics & Gynecology | Admitting: Obstetrics & Gynecology

## 2012-08-05 DIAGNOSIS — Z780 Asymptomatic menopausal state: Secondary | ICD-10-CM

## 2013-03-09 ENCOUNTER — Encounter: Payer: Self-pay | Admitting: Nurse Practitioner

## 2013-03-09 ENCOUNTER — Ambulatory Visit (INDEPENDENT_AMBULATORY_CARE_PROVIDER_SITE_OTHER): Payer: BC Managed Care – PPO | Admitting: Nurse Practitioner

## 2013-03-09 VITALS — BP 110/64 | HR 74 | Temp 98.5°F | Ht 68.0 in

## 2013-03-09 DIAGNOSIS — J069 Acute upper respiratory infection, unspecified: Secondary | ICD-10-CM

## 2013-03-09 MED ORDER — BENZONATATE 100 MG PO CAPS: 100.0000 mg | ORAL_CAPSULE | Freq: Two times a day (BID) | ORAL | Status: DC | PRN

## 2013-03-09 MED ORDER — AZITHROMYCIN 250 MG PO TABS: ORAL_TABLET | ORAL | Status: DC

## 2013-03-09 NOTE — Patient Instructions (Signed)
I think you havae a viral respiratory infection. Likely you will be better 10 days without treatment. However, you may use benzonatate capsules to decrease cough and Lloyd Huger med sinus rinse daily to decrease post-nasal drip. If you develop chest pain with inspiration or fever, please start antibiotic. Call us if no improvement by next week. Feel better!  Bronchitis Bronchitis is the body's way of reacting to injury and/or infection (inflammation) of the bronchi. Bronchi are the air tubes that extend from the windpipe into the lungs. If the inflammation becomes severe, it may cause shortness of breath. CAUSES  Inflammation may be caused by:  A virus.  Germs (bacteria).  Dust.  Allergens.  Pollutants and many other irritants. The cells lining the bronchial tree are covered with tiny hairs (cilia). These constantly beat upward, away from the lungs, toward the mouth. This keeps the lungs free of pollutants. When these cells become too irritated and are unable to do their job, mucus begins to develop. This causes the characteristic cough of bronchitis. The cough clears the lungs when the cilia are unable to do their job. Without either of these protective mechanisms, the mucus would settle in the lungs. Then you would develop pneumonia. Smoking is a common cause of bronchitis and can contribute to pneumonia. Stopping this habit is the single most important thing you can do to help yourself. TREATMENT   Your caregiver may prescribe an antibiotic if the cough is caused by bacteria. Also, medicines that open up your airways make it easier to breathe. Your caregiver may also recommend or prescribe an expectorant. It will loosen the mucus to be coughed up. Only take over-the-counter or prescription medicines for pain, discomfort, or fever as directed by your caregiver.  Removing whatever causes the problem (smoking, for example) is critical to preventing the problem from getting worse.  Cough suppressants  may be prescribed for relief of cough symptoms.  Inhaled medicines may be prescribed to help with symptoms now and to help prevent problems from returning.  For those with recurrent (chronic) bronchitis, there may be a need for steroid medicines. SEEK IMMEDIATE MEDICAL CARE IF:   During treatment, you develop more pus-like mucus (purulent sputum).  You have a fever.  You become progressively more ill.  You have increased difficulty breathing, wheezing, or shortness of breath. It is necessary to seek immediate medical care if you are elderly or sick from any other disease. MAKE SURE YOU:   Understand these instructions.  Will watch your condition.  Will get help right away if you are not doing well or get worse. Document Released: 04/07/2005 Document Revised: 12/08/2012 Document Reviewed: 11/30/2012 [facility] Patient Information 2014 [place], [place].

## 2013-03-09 NOTE — Progress Notes (Signed)
  Subjective:    Patient ID: Chelsey Jackson, female    DOB: 04-02-1951, 62 y.o.   MRN: [id number]  Cough This is a new problem. The current episode started 1 to 4 weeks ago (2 weeks). The problem has been gradually worsening. The problem occurs every few minutes. The cough is productive of sputum. Associated symptoms include chest pain (sternal cp w/coughing), nasal congestion and postnasal drip. Pertinent negatives include no chills, ear congestion, ear pain, eye redness, fever, headaches, rash, sore throat, shortness of breath or wheezing. Associated symptoms comments: No inhaler use . Treatments tried: allergy meds. The treatment provided no relief. Her past medical history is significant for asthma and environmental allergies.      Review of Systems  Constitutional: Negative for fever, chills, activity change and appetite change.  HENT: Positive for congestion and postnasal drip. Negative for ear pain, sinus pressure, sore throat and voice change.   Eyes: Negative for redness.  Respiratory: Positive for cough. Negative for chest tightness, shortness of breath and wheezing.   Cardiovascular: Positive for chest pain (sternal cp w/coughing).  Gastrointestinal: Negative for nausea, abdominal pain and diarrhea.  Musculoskeletal: Negative for back pain.  Skin: Negative for rash.  Allergic/Immunologic: Positive for environmental allergies.  Neurological: Negative for headaches.  Hematological: Negative for adenopathy.       Objective:   Physical Exam  Vitals reviewed. Constitutional: She is oriented to person, place, and time. She appears well-developed and well-nourished. No distress.  HENT:  Head: Normocephalic and atraumatic.  Right Ear: External ear normal.  Left Ear: External ear normal.  Mouth/Throat: Oropharynx is clear and moist. No oropharyngeal exudate.  Eyes: Conjunctivae are normal. Right eye exhibits no discharge. Left eye exhibits no discharge.  Neck: Normal range of  motion. Neck supple. No thyromegaly present.  Cardiovascular: Normal rate, regular rhythm and normal heart sounds.   No murmur heard. Pulmonary/Chest: Effort normal and breath sounds normal. No respiratory distress. She has no wheezes.  Lymphadenopathy:    She has no cervical adenopathy.  Neurological: She is alert and oriented to person, place, and time.  Skin: Skin is warm and dry.  Psychiatric: She has a normal mood and affect. Her behavior is normal. Thought content normal.          Assessment & Plan:  1. Acute upper respiratory infections of unspecified site Likely viral bronchitis. 2 weeks duration. - benzonatate (TESSALON) 100 MG capsule; Take 1 capsule (100 mg total) by mouth 2 (two) times daily as needed for cough.  Dispense: 20 capsule; Refill: 0 - azithromycin (ZITHROMAX) 250 MG tablet; Take 2 capsules on day 1, then 1 capsule on days 2-5.  Dispense: 6 tablet; Refill: 0  See instructions.

## 2013-03-09 NOTE — Progress Notes (Signed)
Pre visit review using our clinic review tool, if applicable. No additional management support is needed unless otherwise documented below in the visit note. 

## 2013-05-26 ENCOUNTER — Encounter: Payer: Self-pay | Admitting: Internal Medicine

## 2013-06-20 ENCOUNTER — Ambulatory Visit: Payer: BC Managed Care – PPO | Admitting: Internal Medicine

## 2013-07-11 ENCOUNTER — Encounter: Payer: Self-pay | Admitting: Internal Medicine

## 2013-07-18 ENCOUNTER — Ambulatory Visit
Admission: RE | Admit: 2013-07-18 | Discharge: 2013-07-18 | Disposition: A | Discharge status: Home / Self Care | Payer: Self-pay | Source: Ambulatory Visit | Attending: Obstetrics & Gynecology | Admitting: Obstetrics & Gynecology

## 2013-07-18 DIAGNOSIS — Z1231 Encounter for screening mammogram for malignant neoplasm of breast: Secondary | ICD-10-CM

## 2013-07-29 ENCOUNTER — Encounter: Payer: Self-pay | Admitting: Internal Medicine

## 2013-07-29 ENCOUNTER — Ambulatory Visit (INDEPENDENT_AMBULATORY_CARE_PROVIDER_SITE_OTHER): Payer: BC Managed Care – PPO | Admitting: Internal Medicine

## 2013-07-29 VITALS — BP 140/82 | HR 64 | Temp 98.3°F | Ht 68.0 in | Wt 186.4 lb

## 2013-07-29 DIAGNOSIS — Z23 Encounter for immunization: Secondary | ICD-10-CM

## 2013-07-29 DIAGNOSIS — R0989 Other specified symptoms and signs involving the circulatory and respiratory systems: Secondary | ICD-10-CM

## 2013-07-29 DIAGNOSIS — Z0000 Encounter for general adult medical examination without abnormal findings: Secondary | ICD-10-CM

## 2013-07-29 DIAGNOSIS — R0609 Other forms of dyspnea: Secondary | ICD-10-CM

## 2013-07-29 DIAGNOSIS — E041 Nontoxic single thyroid nodule: Secondary | ICD-10-CM

## 2013-07-29 DIAGNOSIS — F341 Dysthymic disorder: Secondary | ICD-10-CM

## 2013-07-29 DIAGNOSIS — R0683 Snoring: Secondary | ICD-10-CM

## 2013-07-29 NOTE — Patient Instructions (Addendum)
It was good to see you today.  We have reviewed your prior records including labs and tests today  Health Maintenance reviewed - Pneumonia vaccine updated today -all other recommended immunizations and age-appropriate screenings are up-to-date.  Test(s) ordered today. Return next week when you're fasting. Your results will be released to [place] (or called to you) after review, usually within 72hours after test completion. If any changes need to be made, you will be notified at that same time.  we'll make referral to sleep specialist and pulmonary division for evaluation of snoring. Also for repeat ultrasound of your thyroid. Our office will contact you regarding appointment(s) once made.  Medications reviewed and updated, no changes recommended at this time.  Please schedule followup in 12 months for annual exam and labs, call sooner if problems.   Health Maintenance, Female A healthy lifestyle and preventative care can promote health and wellness.  Maintain regular health, dental, and eye exams.  Eat a healthy diet. Foods like vegetables, fruits, whole grains, low-fat dairy products, and lean protein foods contain the nutrients you need without too many calories. Decrease your intake of foods high in solid fats, added sugars, and salt. Get information about a proper diet from your caregiver, if necessary.  Regular physical exercise is one of the most important things you can do for your health. Most adults should get at least 150 minutes of moderate-intensity exercise (any activity that increases your heart rate and causes you to sweat) each week. In addition, most adults need muscle-strengthening exercises on 2 or more days a week.   Maintain a healthy weight. The body mass index (BMI) is a screening tool to identify possible weight problems. It provides an estimate of body fat based on height and weight. Your caregiver can help determine your BMI, and can help you achieve or maintain a  healthy weight. For adults 20 years and older:  A BMI below 18.5 is considered underweight.  A BMI of 18.5 to 24.9 is normal.  A BMI of 25 to 29.9 is considered overweight.  A BMI of 30 and above is considered obese.  Maintain normal blood lipids and cholesterol by exercising and minimizing your intake of saturated fat. Eat a balanced diet with plenty of fruits and vegetables. Blood tests for lipids and cholesterol should begin at age 43 and be repeated every 5 years. If your lipid or cholesterol levels are high, you are over 50, or you are a high risk for heart disease, you may need your cholesterol levels checked more frequently.Ongoing high lipid and cholesterol levels should be treated with medicines if diet and exercise are not effective.  If you smoke, find out from your caregiver how to quit. If you do not use tobacco, do not start.  Lung cancer screening is recommended for adults aged 41 80 years who are at high risk for developing lung cancer because of a history of smoking. Yearly low-dose computed tomography (CT) is recommended for people who have at least a 30-pack-year history of smoking and are a current smoker or have quit within the past 15 years. A pack year of smoking is smoking an average of 1 pack of cigarettes a day for 1 year (for example: 1 pack a day for 30 years or 2 packs a day for 15 years). Yearly screening should continue until the smoker has stopped smoking for at least 15 years. Yearly screening should also be stopped for people who develop a health problem that would prevent them from  having lung cancer treatment.  If you are pregnant, do not drink alcohol. If you are breastfeeding, be very cautious about drinking alcohol. If you are not pregnant and choose to drink alcohol, do not exceed 1 drink per day. One drink is considered to be 12 ounces (355 mL) of beer, 5 ounces (148 mL) of wine, or 1.5 ounces (44 mL) of liquor.  Avoid use of street drugs. Do not share  needles with anyone. Ask for help if you need support or instructions about stopping the use of drugs.  High blood pressure causes heart disease and increases the risk of stroke. Blood pressure should be checked at least every 1 to 2 years. Ongoing high blood pressure should be treated with medicines, if weight loss and exercise are not effective.  If you are 69 to 62 years old, ask your caregiver if you should take aspirin to prevent strokes.  Diabetes screening involves taking a blood sample to check your fasting blood sugar level. This should be done once every 3 years, after age 77, if you are within normal weight and without risk factors for diabetes. Testing should be considered at a younger age or be carried out more frequently if you are overweight and have at least 1 risk factor for diabetes.  Breast cancer screening is essential preventative care for women. You should practice "breast self-awareness." This means understanding the normal appearance and feel of your breasts and may include breast self-examination. Any changes detected, no matter how small, should be reported to a caregiver. Women in their 9s and 30s should have a clinical breast exam (CBE) by a caregiver as part of a regular health exam every 1 to 3 years. After age 69, women should have a CBE every year. Starting at age 33, women should consider having a mammogram (breast X-ray) every year. Women who have a family history of breast cancer should talk to their caregiver about genetic screening. Women at a high risk of breast cancer should talk to their caregiver about having an MRI and a mammogram every year.  Breast cancer gene (BRCA)-related cancer risk assessment is recommended for women who have family members with BRCA-related cancers. BRCA-related cancers include breast, ovarian, tubal, and peritoneal cancers. Having family members with these cancers may be associated with an increased risk for harmful changes (mutations) in  the breast cancer genes BRCA1 and BRCA2. Results of the assessment will determine the need for genetic counseling and BRCA1 and BRCA2 testing.  The Pap test is a screening test for cervical cancer. Women should have a Pap test starting at age 98. Between ages 2 and 39, Pap tests should be repeated every 2 years. Beginning at age 71, you should have a Pap test every 3 years as long as the past 3 Pap tests have been normal. If you had a hysterectomy for a problem that was not cancer or a condition that could lead to cancer, then you no longer need Pap tests. If you are between ages 29 and 45, and you have had normal Pap tests going back 10 years, you no longer need Pap tests. If you have had past treatment for cervical cancer or a condition that could lead to cancer, you need Pap tests and screening for cancer for at least 20 years after your treatment. If Pap tests have been discontinued, risk factors (such as a new sexual partner) need to be reassessed to determine if screening should be resumed. Some women have medical problems that increase  the chance of getting cervical cancer. In these cases, your caregiver may recommend more frequent screening and Pap tests.  The human papillomavirus (HPV) test is an additional test that may be used for cervical cancer screening. The HPV test looks for the virus that can cause the cell changes on the cervix. The cells collected during the Pap test can be tested for HPV. The HPV test could be used to screen women aged 55 years and older, and should be used in women of any age who have unclear Pap test results. After the age of 45, women should have HPV testing at the same frequency as a Pap test.  Colorectal cancer can be detected and often prevented. Most routine colorectal cancer screening begins at the age of 10 and continues through age 66. However, your caregiver may recommend screening at an earlier age if you have risk factors for colon cancer. On a yearly basis,  your caregiver may provide home test kits to check for hidden blood in the stool. Use of a small camera at the end of a tube, to directly examine the colon (sigmoidoscopy or colonoscopy), can detect the earliest forms of colorectal cancer. Talk to your caregiver about this at age 20, when routine screening begins. Direct examination of the colon should be repeated every 5 to 10 years through age 16, unless early forms of pre-cancerous polyps or small growths are found.  Hepatitis C blood testing is recommended for all people born from [birth year] through 1963 and any individual with known risks for hepatitis C.  Practice safe sex. Use condoms and avoid high-risk sexual practices to reduce the spread of sexually transmitted infections (STIs). Sexually active women aged 8 and younger should be checked for Chlamydia, which is a common sexually transmitted infection. Older women with new or multiple partners should also be tested for Chlamydia. Testing for other STIs is recommended if you are sexually active and at increased risk.  Osteoporosis is a disease in which the bones lose minerals and strength with aging. This can result in serious bone fractures. The risk of osteoporosis can be identified using a bone density scan. Women ages 54 and over and women at risk for fractures or osteoporosis should discuss screening with their caregivers. Ask your caregiver whether you should be taking a calcium supplement or vitamin D to reduce the rate of osteoporosis.  Menopause can be associated with physical symptoms and risks. Hormone replacement therapy is available to decrease symptoms and risks. You should talk to your caregiver about whether hormone replacement therapy is right for you.  Use sunscreen. Apply sunscreen liberally and repeatedly throughout the day. You should seek shade when your shadow is shorter than you. Protect yourself by wearing long sleeves, pants, a wide-brimmed hat, and sunglasses year round,  whenever you are outdoors.  Notify your caregiver of new moles or changes in moles, especially if there is a change in shape or color. Also notify your caregiver if a mole is larger than the size of a pencil eraser.  Stay current with your immunizations. Document Released: 10/21/2010 Document Revised: 08/02/2012 Document Reviewed: 10/21/2010 [facility] Patient Information 2014 [place].
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
## 2013-07-29 NOTE — Progress Notes (Signed)
Pre visit review using our clinic review tool, if applicable. No additional management support is needed unless otherwise documented below in the visit note. 

## 2013-07-31 DIAGNOSIS — G4733 Obstructive sleep apnea (adult) (pediatric): Secondary | ICD-10-CM | POA: Insufficient documentation

## 2013-07-31 HISTORY — DX: Obstructive sleep apnea (adult) (pediatric): G47.33

## 2013-07-31 NOTE — Assessment & Plan Note (Signed)
H/o adenomatous thyroid nodule with FNA in March '13. Per UpToDate recommendation is follow up U/S at 6-18 months, if > 20% increase in any dimension - repeat FNA.  Plan Schedule Thyroid u/s now

## 2013-07-31 NOTE — Assessment & Plan Note (Signed)
Denies daytime fatigue but reports friend concerned about potential for OSA Refer to sleep for further eval/tx of same
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
## 2013-08-02 NOTE — Addendum Note (Signed)
Addended by: Earnstine Regal on: 08/02/2013 11:51 AM   Modules accepted: Orders

## 2013-08-03 ENCOUNTER — Other Ambulatory Visit (INDEPENDENT_AMBULATORY_CARE_PROVIDER_SITE_OTHER): Payer: BC Managed Care – PPO

## 2013-08-03 DIAGNOSIS — Z0000 Encounter for general adult medical examination without abnormal findings: Secondary | ICD-10-CM

## 2013-08-03 LAB — BASIC METABOLIC PANEL
BUN: 23 mg/dL (ref 6–23)
CHLORIDE: 106 meq/L (ref 96–112)
CO2: 29 meq/L (ref 19–32)
Calcium: 9.4 mg/dL (ref 8.4–10.5)
Creatinine, Ser: 0.8 mg/dL (ref 0.4–1.2)
GFR: 77.6 mL/min (ref >60.00)
Glucose, Bld: 98 mg/dL (ref 70–99)
Potassium: 4.4 mEq/L (ref 3.5–5.1)
SODIUM: 141 meq/L (ref 135–145)

## 2013-08-03 LAB — LIPID PANEL
CHOL/HDL RATIO: 5
Cholesterol: 199 mg/dL (ref 0–200)
HDL: 36.2 mg/dL — AB (ref >39.00)
LDL Cholesterol: 142 mg/dL — ABNORMAL HIGH (ref 0–99)
Triglycerides: 102 mg/dL (ref 0.0–149.0)
VLDL: 20.4 mg/dL (ref 0.0–40.0)

## 2013-08-03 LAB — CBC WITH DIFFERENTIAL/PLATELET
BASOS PCT: 0.5 % (ref 0.0–3.0)
Basophils Absolute: 0 10*3/uL (ref 0.0–0.1)
EOS ABS: 0.1 10*3/uL (ref 0.0–0.7)
EOS PCT: 2 % (ref 0.0–5.0)
HCT: 34.3 % — ABNORMAL LOW (ref 36.0–46.0)
HEMOGLOBIN: 11.6 g/dL — AB (ref 12.0–15.0)
LYMPHS PCT: 19.9 % (ref 12.0–46.0)
Lymphs Abs: 0.9 10*3/uL (ref 0.7–4.0)
MCHC: 33.6 g/dL (ref 30.0–36.0)
MCV: 77.8 fl — AB (ref 78.0–100.0)
MONO ABS: 0.4 10*3/uL (ref 0.1–1.0)
Monocytes Relative: 8.3 % (ref 3.0–12.0)
NEUTROS ABS: 3.1 10*3/uL (ref 1.4–7.7)
Neutrophils Relative %: 69.3 % (ref 43.0–77.0)
Platelets: 251 10*3/uL (ref 150.0–400.0)
RBC: 4.42 Mil/uL (ref 3.87–5.11)
RDW: 14.8 % — ABNORMAL HIGH (ref 11.5–14.6)
WBC: 4.5 10*3/uL (ref 4.5–10.5)

## 2013-08-03 LAB — URINALYSIS, ROUTINE W REFLEX MICROSCOPIC
BILIRUBIN URINE: NEGATIVE
Hgb urine dipstick: NEGATIVE
Ketones, ur: NEGATIVE
LEUKOCYTES UA: NEGATIVE
NITRITE: NEGATIVE
PH: 6 (ref 5.0–8.0)
SPECIFIC GRAVITY, URINE: 1.025 (ref 1.000–1.030)
TOTAL PROTEIN, URINE-UPE24: NEGATIVE
Urine Glucose: NEGATIVE
Urobilinogen, UA: 0.2 (ref 0.0–1.0)

## 2013-08-03 LAB — HEPATIC FUNCTION PANEL
ALBUMIN: 4.2 g/dL (ref 3.5–5.2)
ALK PHOS: 53 U/L (ref 39–117)
ALT: 25 U/L (ref 0–35)
AST: 22 U/L (ref 0–37)
BILIRUBIN DIRECT: 0.1 mg/dL (ref 0.0–0.3)
TOTAL PROTEIN: 7.3 g/dL (ref 6.0–8.3)
Total Bilirubin: 0.5 mg/dL (ref 0.3–1.2)

## 2013-08-03 LAB — TSH: TSH: 2.92 u[IU]/mL (ref 0.35–5.50)

## 2013-08-09 ENCOUNTER — Ambulatory Visit
Admission: RE | Admit: 2013-08-09 | Discharge: 2013-08-09 | Disposition: A | Discharge status: Home / Self Care | Payer: BC Managed Care – PPO | Source: Ambulatory Visit | Attending: Internal Medicine | Admitting: Internal Medicine

## 2013-08-09 DIAGNOSIS — E041 Nontoxic single thyroid nodule: Secondary | ICD-10-CM

## 2013-08-18 ENCOUNTER — Encounter: Payer: Self-pay | Admitting: Pulmonary Disease

## 2013-08-18 ENCOUNTER — Ambulatory Visit (INDEPENDENT_AMBULATORY_CARE_PROVIDER_SITE_OTHER): Payer: BC Managed Care – PPO | Admitting: Pulmonary Disease

## 2013-08-18 VITALS — BP 118/78 | HR 64 | Ht 68.0 in | Wt 187.0 lb

## 2013-08-18 DIAGNOSIS — G4700 Insomnia, unspecified: Secondary | ICD-10-CM

## 2013-08-18 DIAGNOSIS — R0989 Other specified symptoms and signs involving the circulatory and respiratory systems: Secondary | ICD-10-CM

## 2013-08-18 DIAGNOSIS — G4733 Obstructive sleep apnea (adult) (pediatric): Secondary | ICD-10-CM

## 2013-08-18 DIAGNOSIS — R0683 Snoring: Secondary | ICD-10-CM

## 2013-08-18 DIAGNOSIS — R0609 Other forms of dyspnea: Secondary | ICD-10-CM

## 2013-08-18 NOTE — Assessment & Plan Note (Signed)
She is to continue [place] for now.  Explained how obstructive sleep apnea symptoms can sometimes be confused with insomnia in relation to trouble falling or staying asleep.  Will re-address her need for sleep aide after review of her sleep study.

## 2013-08-18 NOTE — Assessment & Plan Note (Addendum)
She has snoring, sleep disruption, witnessed apnea, and daytime sleepiness.  She has history of depression.  I am concerned she could have sleep apnea.  We discussed how sleep apnea can affect various health problems including risks for hypertension, cardiovascular disease, and diabetes.  We also discussed how sleep disruption can increase risks for accident, such as while driving.  Weight loss as a means of improving sleep apnea was also reviewed.  Additional treatment options discussed were CPAP therapy, oral appliance, and surgical intervention.  If she does have sleep apnea, then she might be a good candidate for an oral appliance.

## 2013-08-18 NOTE — Progress Notes (Signed)
   Subjective:    Patient ID: Chelsey Jackson, female    DOB: 1950/08/21, 63 y.o.   MRN: [id number]  HPI    Review of Systems  Constitutional: Negative for fever, chills, diaphoresis, activity change, appetite change, fatigue and unexpected weight change.  HENT: Positive for congestion. Negative for dental problem, ear discharge, ear pain, facial swelling, hearing loss, mouth sores, nosebleeds, postnasal drip, rhinorrhea, sinus pressure, sneezing, sore throat, tinnitus, trouble swallowing and voice change.   Eyes: Negative for photophobia, discharge, itching and visual disturbance.  Respiratory: Negative for apnea, cough, choking, chest tightness, shortness of breath, wheezing and stridor.   Cardiovascular: Negative for chest pain, palpitations and leg swelling.  Gastrointestinal: Negative for nausea, vomiting, abdominal pain, constipation, blood in stool and abdominal distention.  Genitourinary: Negative for dysuria, urgency, frequency, hematuria, flank pain, decreased urine volume and difficulty urinating.  Musculoskeletal: Negative for arthralgias, back pain, gait problem, joint swelling, myalgias, neck pain and neck stiffness.  Skin: Negative for color change, pallor and rash.  Neurological: Negative for dizziness, tremors, seizures, syncope, speech difficulty, weakness, light-headedness, numbness and headaches.  Hematological: Negative for adenopathy. Does not bruise/bleed easily.  Psychiatric/Behavioral: Positive for dysphoric mood. Negative for confusion, sleep disturbance and agitation. The patient is not nervous/anxious.        Objective:   Physical Exam        Assessment & Plan:

## 2013-08-18 NOTE — Patient Instructions (Signed)
Will arrange for sleep study Will call to arrange for follow up after sleep study reviewed 

## 2013-08-18 NOTE — Progress Notes (Signed)
Chief Complaint  Patient presents with  . Sleep Consult    referred by Dr. Asa Lente for snoring. Epworth Score: 1.    History of Present Illness: Chelsey Jackson is a 63 y.o. female for evaluation of sleep problems.  She has been on [place] for years for insomnia.  She is not aware of having a problem with her sleep.  Her husband has told her that she snores, and this has been getting worse.  She was also told by a family friend that she gasps while asleep.  Her snoring is worse on her back.  She goes to sleep at 10 pm after taking ambien.  She falls asleep after 30 minutes.  She wakes up one time to use the bathroom.  She gets out of bed at 615 am.  She feels tire in the morning.  She denies morning headache.  She drinks two cups of coffee in the morning.  She denies sleep walking, sleep talking, bruxism, or nightmares.  There is no history of restless legs.  She denies sleep hallucinations, sleep paralysis, or cataplexy.  She works as an Database administrator.  She will get sleepy at work if she is not active.  She has history of allergies and is on allergy shots with Dr. Harold Hedge.  She also had multiple sinus surgeries.    The Epworth score is 2 out of 24.   Chelsey Jackson  has a past medical history of Allergy; Depression; GERD (gastroesophageal reflux disease); Asthma; Anxiety; Peptic ulcer disease; Fibrocystic breast disease; and AC (acromioclavicular) joint bone spurs.  Chelsey Jackson  has past surgical history that includes Tonsillectomy; Adenoidectomy; Abdominal hysterectomy (2003); Tubal ligation; Rhinoplasty (1984); Turbinate reduction (1997); Pubovaginal sling ('90's); Colonoscopy (03/10/2007); Rotator cuff repair (Right, 2013); and Rotator cuff repair (Left, 2012).  Prior to Admission medications   Medication Sig Start Date End Date Taking? Authorizing Provider  albuterol (PROVENTIL,VENTOLIN) 90 MCG/ACT inhaler Inhale 2 puffs into the lungs every 6 (six) hours as  needed.     Yes Historical Provider, MD  buPROPion (WELLBUTRIN XL) 300 MG 24 hr tablet Take 300 mg by mouth daily.   Yes Historical Provider, MD  Calcium Carbonate-Vitamin D (CALTRATE 600+D) 600-400 MG-UNIT per tablet Take 1 tablet by mouth daily.     Yes Historical Provider, MD  fexofenadine (ALLEGRA) 180 MG tablet Take 180 mg by mouth daily.   Yes Historical Provider, MD  Multiple Vitamin (MULTIVITAMIN) tablet Take 1 tablet by mouth daily.     Yes Historical Provider, MD  sertraline (ZOLOFT) 100 MG tablet Take 100 mg by mouth daily.   Yes Historical Provider, MD  zolpidem (AMBIEN CR) 12.5 MG CR tablet Take 12.5 mg by mouth at bedtime.    Yes Historical Provider, MD    Allergies  Allergen Reactions  . Penicillins     REACTION: stop breathing    Her family history includes Diabetes in her maternal grandmother and paternal grandfather; Fibroids in her mother; Heart disease in her father, maternal grandfather, and paternal grandmother; Lung cancer (age of onset: 44) in her mother; Osteoporosis in her mother; Other in her mother; Prostate cancer (age of onset: 52) in her father; Testicular cancer (age of onset: 68) in her father.  She  reports that she quit smoking about 23 years ago. Her smoking use included Cigarettes. She has a 30 pack-year smoking history. She does not have any smokeless tobacco history on file. She reports that she drinks alcohol. She reports that she  does not use illicit drugs.  Review of Systems  Constitutional: Negative for fever, chills, diaphoresis, activity change, appetite change, fatigue and unexpected weight change.  HENT: Positive for congestion. Negative for dental problem, ear discharge, ear pain, facial swelling, hearing loss, mouth sores, nosebleeds, postnasal drip, rhinorrhea, sinus pressure, sneezing, sore throat, tinnitus, trouble swallowing and voice change.   Eyes: Negative for photophobia, discharge, itching and visual disturbance.  Respiratory: Negative  for apnea, cough, choking, chest tightness, shortness of breath, wheezing and stridor.   Cardiovascular: Negative for chest pain, palpitations and leg swelling.  Gastrointestinal: Negative for nausea, vomiting, abdominal pain, constipation, blood in stool and abdominal distention.  Genitourinary: Negative for dysuria, urgency, frequency, hematuria, flank pain, decreased urine volume and difficulty urinating.  Musculoskeletal: Negative for arthralgias, back pain, gait problem, joint swelling, myalgias, neck pain and neck stiffness.  Skin: Negative for color change, pallor and rash.  Neurological: Negative for dizziness, tremors, seizures, syncope, speech difficulty, weakness, light-headedness, numbness and headaches.  Hematological: Negative for adenopathy. Does not bruise/bleed easily.  Psychiatric/Behavioral: Positive for dysphoric mood. Negative for confusion, sleep disturbance and agitation. The patient is not nervous/anxious.    Physical Exam:  General - No distress ENT - No sinus tenderness, no oral exudate, no LAN, no thyromegaly, TM clear, pupils equal/reactive, MP 4, enlarged tongue Cardiac - s1s2 regular, no murmur, pulses symmetric Chest - No wheeze/rales/dullness, good air entry, normal respiratory excursion Back - No focal tenderness Abd - Soft, non-tender, no organomegaly, + bowel sounds Ext - No edema Neuro - Normal strength, cranial nerves intact Skin - No rashes Psych - Normal mood, and behavior  Assessment/plan:  [name], M.D. Pager [phone]

## 2013-08-31 ENCOUNTER — Encounter: Payer: Self-pay | Admitting: Internal Medicine

## 2013-09-15 ENCOUNTER — Encounter: Payer: Self-pay | Admitting: Internal Medicine

## 2013-09-19 ENCOUNTER — Telehealth: Payer: Self-pay | Admitting: *Deleted

## 2013-09-19 MED ORDER — SCOPOLAMINE 1 MG/3DAYS TD PT72: 1.0000 | MEDICATED_PATCH | TRANSDERMAL | Status: DC

## 2013-09-19 NOTE — Telephone Encounter (Signed)
I have sent her a reply to MyChart - but please call pt to let her know this has been done - thanks!

## 2013-09-19 NOTE — Telephone Encounter (Signed)
Pt called to check up on this request, pt stated that she is leaving this Thursday and she is trying to get this done for 2 weeks now. Please advise. Please call [phone].

## 2013-09-19 NOTE — Telephone Encounter (Signed)
Pt sent my-chart msg already. MD has replied to msg...Johny Chess

## 2013-10-05 ENCOUNTER — Encounter: Payer: Self-pay | Admitting: Internal Medicine

## 2013-10-05 ENCOUNTER — Encounter (HOSPITAL_BASED_OUTPATIENT_CLINIC_OR_DEPARTMENT_OTHER): Payer: BC Managed Care – PPO

## 2013-10-11 ENCOUNTER — Ambulatory Visit (HOSPITAL_BASED_OUTPATIENT_CLINIC_OR_DEPARTMENT_OTHER): Payer: BC Managed Care – PPO | Attending: Pulmonary Disease | Admitting: Radiology

## 2013-10-11 VITALS — Ht 68.0 in | Wt 180.0 lb

## 2013-10-11 DIAGNOSIS — G4733 Obstructive sleep apnea (adult) (pediatric): Secondary | ICD-10-CM | POA: Insufficient documentation

## 2013-10-19 ENCOUNTER — Telehealth: Payer: Self-pay | Admitting: Pulmonary Disease

## 2013-10-19 DIAGNOSIS — G4733 Obstructive sleep apnea (adult) (pediatric): Secondary | ICD-10-CM

## 2013-10-19 NOTE — Sleep Study (Signed)
[place]  NAME: Chelsey Jackson DATE OF BIRTH:  December 14, 1950 MEDICAL RECORD NUMBER [id number]  LOCATION: [facility]  PHYSICIAN: [name], M.D. DATE OF STUDY: 10/11/2013  SLEEP STUDY TYPE: Split night protocol               REFERRING PHYSICIAN: [name], MD  INDICATION FOR STUDY:  Chelsey Jackson is a 63 y.o. female who presents to the sleep lab for evaluation of hypersomnia with obstructive sleep apnea.  She reports snoring, sleep disruption, apnea, and daytime sleepiness.  EPWORTH SLEEPINESS SCORE: 2. HEIGHT: 5\' 8"  (172.7 cm)  WEIGHT: 180 lb (81.647 kg)    Body mass index is 27.38 kg/(m^2).  NECK SIZE: 14 in.  MEDICATIONS:  Current Outpatient Prescriptions on File Prior to Visit  Medication Sig Dispense Refill  . albuterol (PROVENTIL,VENTOLIN) 90 MCG/ACT inhaler Inhale 2 puffs into the lungs every 6 (six) hours as needed.        Marland Kitchen buPROPion (WELLBUTRIN XL) 300 MG 24 hr tablet Take 300 mg by mouth daily.      . Calcium Carbonate-Vitamin D (CALTRATE 600+D) 600-400 MG-UNIT per tablet Take 1 tablet by mouth daily.        . fexofenadine (ALLEGRA) 180 MG tablet Take 180 mg by mouth daily.      . Multiple Vitamin (MULTIVITAMIN) tablet Take 1 tablet by mouth daily.        Marland Kitchen scopolamine (TRANSDERM-SCOP) 1 MG/3DAYS Place 1 patch (1.5 mg total) onto the skin every 3 (three) days.  10 patch  0  . sertraline (ZOLOFT) 100 MG tablet Take 100 mg by mouth daily.      Marland Kitchen zolpidem (AMBIEN CR) 12.5 MG CR tablet Take 12.5 mg by mouth at bedtime.        No current facility-administered medications on file prior to visit.    SLEEP ARCHITECTURE:  Diagnostic portion: Total recording time: 183.5 minutes.  Total sleep time was: 121 minutes.  Sleep efficiency: 65.9%.  Sleep latency: 60 minutes.  REM latency: N/A minutes.  Stage N1: 7.9%.  Stage N2: 92.1%.  Stage N3: 0%.  Stage R:  0%.  Supine sleep: 35.5 minutes.  Non-supine sleep: 85.5  minutes.  Titration portion: Total recording time: 256.5 minutes.  Total sleep time was: 214 minutes.  Sleep efficiency: 83.4%.  Sleep latency: 4 minutes.  REM latency: 45.5 minutes.  Stage N1: 14.7%.  Stage N2: 71.7%.  Stage N3: 0%.  Stage R:  13.6%.  Supine sleep: 112 minutes.  Non-supine sleep: 102 minutes.  CARDIAC DATA:  Average heart rate: 70 beats per minute. Rhythm strip: sinus rhythm with PAC's.  RESPIRATORY DATA: Diagnostic portion: Average respiratory rate: 19. Snoring: loud. Average AHI: 38.7.   Apnea index: 10.4.  Hypopnea index: 28.3. Obstructive apnea index: 10.4.  Central apnea index: 0.  Mixed apnea index: 0. REM AHI: N/A.  NREM AHI: 38.7. Supine AHI: 126.8. Non-supine AHI: 2.1.  Titration portion: She was started on CPAP 5 and increased to CPAP 16 cm H2O.  With CPAP at 15 cm H2O her AHI was reduced to 0, and she was observed in REM and supine sleep.  MOVEMENT/PARASOMNIA:  Diagnostic portion: Periodic limb movement: 160.2.  Period limb movements with arousals: 6.  Titration portion: Periodic limb movement: 0.  Period limb movements with arousals: 0.  Restroom trips: one.  OXYGEN DATA:  Baseline oxygenation: 93%. Lowest SaO2: 74%. Time spent below SaO2 90%: 27.7 minutes. Supplemental oxygen used: none.  IMPRESSION/  RECOMMENDATION:   This study shows severe obstructive sleep apnea with an AHI of 38.7 and SaO2 low of 74%.  She had a significant position effect to her sleep apnea.  She improved with CPAP 15 cm H2O.  She did continue to have oxygen desaturation most prominently in REM sleep even with adequate control of her apnea.  This is suggestive of REM related hypoventilation.  She fitted with a [employer] small size Simplus full face mask.  I would recommend the patient be started on CPAP 15 cm H2O.  She should then have an overnight oximetry with CPAP and room air.  Depending this result, she may the need supplemental oxygen with CPAP versus  transition to BiPAP therapy.  [name], M.D. Diplomate, Tax adviser of Sleep Medicine  ELECTRONICALLY SIGNED ON:  10/19/2013, 9:06 AM [place] PH: [phone]   FX: [phone] [place]

## 2013-10-19 NOTE — Telephone Encounter (Signed)
PSG 10/11/13 >> AHI 38.7, SaO2 low 74%.  CPAP 15 >> AHI 0, +R, +S.  Decreased SaO2 during REM.  Will have my nurse inform pt that sleep study shows severe sleep apnea.  She will need ROV to discuss treatment options further.

## 2013-10-24 ENCOUNTER — Encounter: Payer: Self-pay | Admitting: Pulmonary Disease

## 2013-10-24 NOTE — Telephone Encounter (Signed)
Pt is requesting the results of her sleep study that was done on June 23.  VS please advise if you have reviewed these. thanks

## 2013-10-25 NOTE — Telephone Encounter (Signed)
LMOM x 1 

## 2013-10-25 NOTE — Telephone Encounter (Signed)
Pt calling again a/b results of sleep study and was wanting to know if youm could post them in my chart please advise.Chelsey Jackson

## 2013-10-25 NOTE — Telephone Encounter (Signed)
Pt returned call

## 2013-10-26 NOTE — Telephone Encounter (Signed)
Results have been explained to patient, pt expressed understanding. Pt scheduled for OV with Dr Halford Chessman 11/11/13 at 4pm Nothing further needed.

## 2013-11-10 ENCOUNTER — Other Ambulatory Visit: Payer: Self-pay | Admitting: Orthopaedic Surgery

## 2013-11-10 DIAGNOSIS — M25532 Pain in left wrist: Secondary | ICD-10-CM

## 2013-11-11 ENCOUNTER — Ambulatory Visit (INDEPENDENT_AMBULATORY_CARE_PROVIDER_SITE_OTHER): Payer: BC Managed Care – PPO | Admitting: Pulmonary Disease

## 2013-11-11 ENCOUNTER — Encounter: Payer: Self-pay | Admitting: Pulmonary Disease

## 2013-11-11 VITALS — BP 126/72 | HR 77 | Temp 97.8°F

## 2013-11-11 DIAGNOSIS — G4733 Obstructive sleep apnea (adult) (pediatric): Secondary | ICD-10-CM

## 2013-11-11 DIAGNOSIS — G4700 Insomnia, unspecified: Secondary | ICD-10-CM

## 2013-11-11 NOTE — Assessment & Plan Note (Signed)
Will re-assess her need for sleep aide medication after her sleep apnea is better controlled.

## 2013-11-11 NOTE — Assessment & Plan Note (Signed)
She has severe sleep apnea.  I have reviewed the recent sleep study results with the patient.  We discussed how sleep apnea can affect various health problems including risks for hypertension, cardiovascular disease, and diabetes.  We also discussed how sleep disruption can increase risks for accident, such as while driving.  Weight loss as a means of improving sleep apnea was also reviewed.  Additional treatment options discussed were CPAP therapy, oral appliance, and surgical intervention.  She would prefer to try oral appliance.  Will arrange for referral to Dr. Oneal Grout to assess for oral appliance to treat her obstructive sleep apnea.

## 2013-11-11 NOTE — Patient Instructions (Signed)
Will arrange for referral to Dr. Mark Katz to assess for oral appliance to treat obstructive sleep apnea  Follow up in 6 months 

## 2013-11-11 NOTE — Progress Notes (Signed)
Chief Complaint  Patient presents with  . Follow-up    Discuss sleep study.    History of Present Illness: Chelsey Jackson is a 63 y.o. female with OSA.  She is here to review her sleep study.  This showed severe sleep apnea.  She does not think she could use CPAP long term.   TESTS: PSG 10/11/13 >> AHI 38.7, SaO2 low 74%.  CPAP 15 >> AHI 0, +R, +S.  Decreased SaO2 during REM.   Past medical hx, Past surgical hx, Medications, Allergies, Family hx, Social hx all reviewed.   Physical Exam:  General - No distress ENT - No sinus tenderness, no oral exudate, no LAN, MP 4 Cardiac - s1s2 regular, no murmur Chest - No wheeze/rales/dullness Back - No focal tenderness Abd - Soft, non-tender Ext - No edema Neuro - Normal strength Skin - No rashes Psych - normal mood, and behavior   Assessment/Plan:  Chelsey Mires, MD [place] [facility]/Sleep Pager:  [phone]

## 2013-11-25 ENCOUNTER — Ambulatory Visit
Admission: RE | Admit: 2013-11-25 | Discharge: 2013-11-25 | Disposition: A | Discharge status: Home / Self Care | Payer: BC Managed Care – PPO | Source: Ambulatory Visit | Attending: Orthopaedic Surgery | Admitting: Orthopaedic Surgery

## 2013-11-25 DIAGNOSIS — M25532 Pain in left wrist: Secondary | ICD-10-CM

## 2013-11-25 MED ORDER — IOHEXOL 180 MG/ML  SOLN
2.0000 mL | Freq: Once | INTRAMUSCULAR | Status: AC | PRN
  Administered 2013-11-25: 2 mL via INTRA_ARTICULAR

## 2014-06-06 ENCOUNTER — Encounter: Payer: Self-pay | Admitting: Pulmonary Disease

## 2014-06-14 ENCOUNTER — Encounter: Payer: Self-pay | Admitting: Pulmonary Disease

## 2014-06-14 ENCOUNTER — Ambulatory Visit (INDEPENDENT_AMBULATORY_CARE_PROVIDER_SITE_OTHER): Payer: BLUE CROSS/BLUE SHIELD | Admitting: Pulmonary Disease

## 2014-06-14 VITALS — BP 118/82 | HR 68 | Temp 98.0°F | Wt 174.0 lb

## 2014-06-14 DIAGNOSIS — G4733 Obstructive sleep apnea (adult) (pediatric): Secondary | ICD-10-CM

## 2014-06-14 NOTE — Progress Notes (Signed)
Chief Complaint  Patient presents with  . Follow-up    OSA; states ortho device is causing her jaw to lock and jaw pain; she is wanting to be set up on CPAP; sleeps 7-8hrs nightly    History of Present Illness: Chelsey Jackson is a 64 y.o. female with OSA.  She was tried on oral appliance.  This controlled her sleep apnea, but she started getting terrible pain in her right TMJ.  She snores and stops breathing if she doesn't use oral appliance.  She is more tired during the day w/o oral appliance.  She wants to try CPAP.  TESTS: PSG 10/11/13 >> AHI 38.7, SaO2 low 74%.  CPAP 15 >> AHI 0, +R, +S.  Decreased SaO2 during REM.   Past medical hx >> Allergies, Depression, Anxiety, Asthma, GERD, PUD, TMJ  Past surgical hx, Medications, Allergies, Family hx, Social hx all reviewed.   Physical Exam: Blood pressure 118/82, pulse 68, temperature 98 F (36.7 C), temperature source Oral, weight 174 lb (78.926 kg), SpO2 99 %. Body mass index is 26.46 kg/(m^2).  General - No distress ENT - No sinus tenderness, no oral exudate, no LAN, MP 4 Cardiac - s1s2 regular, no murmur Chest - No wheeze/rales/dullness Back - No focal tenderness Abd - Soft, non-tender Ext - No edema Neuro - Normal strength Skin - No rashes Psych - normal mood, and behavior   Assessment/Plan:  Severe obstructive sleep apnea. She is not able to use oral appliance due to TMJ. Plan: - will arrange for auto CPAP set up   [name], MD [place] [facility]/Critical Care/Sleep Pager:  [phone]

## 2014-06-14 NOTE — Patient Instructions (Signed)
Will arrange for CPAP set up  Follow up in 2 months after CPAP set up 

## 2014-06-16 ENCOUNTER — Other Ambulatory Visit: Payer: Self-pay

## 2014-06-16 DIAGNOSIS — Z1231 Encounter for screening mammogram for malignant neoplasm of breast: Secondary | ICD-10-CM

## 2014-07-20 ENCOUNTER — Ambulatory Visit
Admission: RE | Admit: 2014-07-20 | Discharge: 2014-07-20 | Disposition: A | Discharge status: Home / Self Care | Payer: BLUE CROSS/BLUE SHIELD | Source: Ambulatory Visit

## 2014-07-20 DIAGNOSIS — Z1231 Encounter for screening mammogram for malignant neoplasm of breast: Secondary | ICD-10-CM

## 2014-07-21 ENCOUNTER — Telehealth: Payer: Self-pay | Admitting: Internal Medicine

## 2014-07-21 NOTE — Telephone Encounter (Signed)
Pt request to transfer from [employer] [facility] to [facility]. Please advise, pt does want to make an appt right now but just wanted to change PCP.

## 2014-07-21 NOTE — Telephone Encounter (Signed)
That's fine, no need to make early appointment.

## 2014-08-01 ENCOUNTER — Encounter: Payer: Self-pay | Admitting: Pulmonary Disease

## 2014-08-15 ENCOUNTER — Ambulatory Visit: Payer: BLUE CROSS/BLUE SHIELD | Admitting: Pulmonary Disease

## 2014-09-20 ENCOUNTER — Encounter: Payer: Self-pay | Admitting: Pulmonary Disease

## 2014-09-20 ENCOUNTER — Ambulatory Visit (INDEPENDENT_AMBULATORY_CARE_PROVIDER_SITE_OTHER): Payer: BLUE CROSS/BLUE SHIELD | Admitting: Pulmonary Disease

## 2014-09-20 VITALS — BP 104/64 | HR 63 | Temp 97.3°F | Ht 68.0 in | Wt 170.0 lb

## 2014-09-20 DIAGNOSIS — G4733 Obstructive sleep apnea (adult) (pediatric): Secondary | ICD-10-CM

## 2014-09-20 NOTE — Progress Notes (Signed)
Chief Complaint  Patient presents with  . Follow-up    Pt uses CPAP for 6-8 hours nightly. Mask fits fine. Pt states no new compliants.     History of Present Illness: Chelsey Jackson is a 64 y.o. female with OSA.  She has been doing well with CPAP.  She uses nasal pillows.  Her only concern is that the straps leave a mark on her face.  She feels that 5 to 6 hours of sleep is sufficient for her.  TESTS: PSG 10/11/13 >> AHI 38.7, SaO2 low 74%.  CPAP 15 >> AHI 0, +R, +S.  Decreased SaO2 during REM. Auto CPAP 08/20/14 to 09/18/14 >> used on 30 of 30 nights with average 5 hrs and 25 min.  Average AHI is 6.7 with median CPAP 6 cm H2O and 95 th percentile CPAP 10 cm H20.  Past medical hx >> Allergies, Depression, Anxiety, Asthma, GERD, PUD, TMJ  Past surgical hx, Medications, Allergies, Family hx, Social hx all reviewed.   Physical Exam: Blood pressure 104/64, pulse 63, temperature 97.3 F (36.3 C), temperature source Oral, height 5\' 8"  (1.727 m), weight 170 lb (77.111 kg), SpO2 97 %. Body mass index is 25.85 kg/(m^2).  General - No distress ENT - No sinus tenderness, no oral exudate, no LAN, MP 4 Cardiac - s1s2 regular, no murmur Chest - No wheeze/rales/dullness Back - No focal tenderness Abd - Soft, non-tender Ext - No edema Neuro - Normal strength Skin - No rashes Psych - normal mood, and behavior   Assessment/Plan:  Severe obstructive sleep apnea. She is compliant with CPAP and reports benefit. Plan: - continue auto CPAP - she will review CPAP mask options on line and call if she finds alternative she would like to try   Chelsey Mires, MD [place] Pager:  [phone]

## 2014-09-20 NOTE — Patient Instructions (Signed)
Can check following company websites for CPAP mask options: [employer], [employer], [employer], [employer]  Follow up in 1 year

## 2014-11-03 ENCOUNTER — Ambulatory Visit (INDEPENDENT_AMBULATORY_CARE_PROVIDER_SITE_OTHER): Payer: BLUE CROSS/BLUE SHIELD | Admitting: Internal Medicine

## 2014-11-03 ENCOUNTER — Encounter: Payer: Self-pay | Admitting: Internal Medicine

## 2014-11-03 VITALS — BP 116/60 | HR 78 | Temp 97.7°F | Resp 18 | Ht 68.0 in | Wt 172.0 lb

## 2014-11-03 DIAGNOSIS — J4520 Mild intermittent asthma, uncomplicated: Secondary | ICD-10-CM

## 2014-11-03 DIAGNOSIS — Z0000 Encounter for general adult medical examination without abnormal findings: Secondary | ICD-10-CM

## 2014-11-03 DIAGNOSIS — E041 Nontoxic single thyroid nodule: Secondary | ICD-10-CM

## 2014-11-03 NOTE — Patient Instructions (Signed)
We have ordered the ultrasound of the neck. If all the nodules are stable we will change that to every 2 years.   We are checking blood work and you can come some morning before you eat. The lab opens at 7:30 AM daily M-F.   Come back in about 1 year and call us if you have any problems or questions before then.   Health Maintenance Adopting a healthy lifestyle and getting preventive care can go a long way to promote health and wellness. Talk with your health care provider about what schedule of regular examinations is right for you. This is a good chance for you to check in with your provider about disease prevention and staying healthy. In between checkups, there are plenty of things you can do on your own. Experts have done a lot of research about which lifestyle changes and preventive measures are most likely to keep you healthy. Ask your health care provider for more information. WEIGHT AND DIET  Eat a healthy diet  Be sure to include plenty of vegetables, fruits, low-fat dairy products, and lean protein.  Do not eat a lot of foods high in solid fats, added sugars, or salt.  Get regular exercise. This is one of the most important things you can do for your health.  Most adults should exercise for at least 150 minutes each week. The exercise should increase your heart rate and make you sweat (moderate-intensity exercise).  Most adults should also do strengthening exercises at least twice a week. This is in addition to the moderate-intensity exercise.  Maintain a healthy weight  Body mass index (BMI) is a measurement that can be used to identify possible weight problems. It estimates body fat based on height and weight. Your health care provider can help determine your BMI and help you achieve or maintain a healthy weight.  For females 64 years of age and older:   A BMI below 18.5 is considered underweight.  A BMI of 18.5 to 24.9 is normal.  A BMI of 25 to 29.9 is considered  overweight.  A BMI of 30 and above is considered obese.  Watch levels of cholesterol and blood lipids  You should start having your blood tested for lipids and cholesterol at 62 years of age, then have this test every 5 years.  You may need to have your cholesterol levels checked more often if:  Your lipid or cholesterol levels are high.  You are older than 62 years of age.  You are at high risk for heart disease.  CANCER SCREENING   Lung Cancer  Lung cancer screening is recommended for adults 59-72 years old who are at high risk for lung cancer because of a history of smoking.  A yearly low-dose CT scan of the lungs is recommended for people who:  Currently smoke.  Have quit within the past 15 years.  Have at least a 30-pack-year history of smoking. A pack year is smoking an average of one pack of cigarettes a day for 1 year.  Yearly screening should continue until it has been 15 years since you quit.  Yearly screening should stop if you develop a health problem that would prevent you from having lung cancer treatment.  Breast Cancer  Practice breast self-awareness. This means understanding how your breasts normally appear and feel.  It also means doing regular breast self-exams. Let your health care provider know about any changes, no matter how small.  If you are in your 72s or  71s, you should have a clinical breast exam (CBE) by a health care provider every 1-3 years as part of a regular health exam.  If you are 66 or older, have a CBE every year. Also consider having a breast X-ray (mammogram) every year.  If you have a family history of breast cancer, talk to your health care provider about genetic screening.  If you are at high risk for breast cancer, talk to your health care provider about having an MRI and a mammogram every year.  Breast cancer gene (BRCA) assessment is recommended for women who have family members with BRCA-related cancers. BRCA-related  cancers include:  Breast.  Ovarian.  Tubal.  Peritoneal cancers.  Results of the assessment will determine the need for genetic counseling and BRCA1 and BRCA2 testing. Cervical Cancer Routine pelvic examinations to screen for cervical cancer are no longer recommended for nonpregnant women who are considered low risk for cancer of the pelvic organs (ovaries, uterus, and vagina) and who do not have symptoms. A pelvic examination may be necessary if you have symptoms including those associated with pelvic infections. Ask your health care provider if a screening pelvic exam is right for you.   The Pap test is the screening test for cervical cancer for women who are considered at risk.  If you had a hysterectomy for a problem that was not cancer or a condition that could lead to cancer, then you no longer need Pap tests.  If you are older than 65 years, and you have had normal Pap tests for the past 10 years, you no longer need to have Pap tests.  If you have had past treatment for cervical cancer or a condition that could lead to cancer, you need Pap tests and screening for cancer for at least 20 years after your treatment.  If you no longer get a Pap test, assess your risk factors if they change (such as having a new sexual partner). This can affect whether you should start being screened again.  Some women have medical problems that increase their chance of getting cervical cancer. If this is the case for you, your health care provider may recommend more frequent screening and Pap tests.  The human papillomavirus (HPV) test is another test that may be used for cervical cancer screening. The HPV test looks for the virus that can cause cell changes in the cervix. The cells collected during the Pap test can be tested for HPV.  The HPV test can be used to screen women 81 years of age and older. Getting tested for HPV can extend the interval between normal Pap tests from three to five  years.  An HPV test also should be used to screen women of any age who have unclear Pap test results.  After 62 years of age, women should have HPV testing as often as Pap tests.  Colorectal Cancer  This type of cancer can be detected and often prevented.  Routine colorectal cancer screening usually begins at 62 years of age and continues through 62 years of age.  Your health care provider may recommend screening at an earlier age if you have risk factors for colon cancer.  Your health care provider may also recommend using home test kits to check for hidden blood in the stool.  A small camera at the end of a tube can be used to examine your colon directly (sigmoidoscopy or colonoscopy). This is done to check for the earliest forms of colorectal cancer.  Routine screening usually begins at age 32.  Direct examination of the colon should be repeated every 5-10 years through 62 years of age. However, you may need to be screened more often if early forms of precancerous polyps or small growths are found. Skin Cancer  Check your skin from head to toe regularly.  Tell your health care provider about any new moles or changes in moles, especially if there is a change in a mole's shape or color.  Also tell your health care provider if you have a mole that is larger than the size of a pencil eraser.  Always use sunscreen. Apply sunscreen liberally and repeatedly throughout the day.  Protect yourself by wearing long sleeves, pants, a wide-brimmed hat, and sunglasses whenever you are outside. HEART DISEASE, DIABETES, AND HIGH BLOOD PRESSURE   Have your blood pressure checked at least every 1-2 years. High blood pressure causes heart disease and increases the risk of stroke.  If you are between 50 years and 39 years old, ask your health care provider if you should take aspirin to prevent strokes.  Have regular diabetes screenings. This involves taking a blood sample to check your fasting  blood sugar level.  If you are at a normal weight and have a low risk for diabetes, have this test once every three years after 62 years of age.  If you are overweight and have a high risk for diabetes, consider being tested at a younger age or more often. PREVENTING INFECTION  Hepatitis B  If you have a higher risk for hepatitis B, you should be screened for this virus. You are considered at high risk for hepatitis B if:  You were born in a country where hepatitis B is common. Ask your health care provider which countries are considered high risk.  Your parents were born in a high-risk country, and you have not been immunized against hepatitis B (hepatitis B vaccine).  You have HIV or AIDS.  You use needles to inject street drugs.  You live with someone who has hepatitis B.  You have had sex with someone who has hepatitis B.  You get hemodialysis treatment.  You take certain medicines for conditions, including cancer, organ transplantation, and autoimmune conditions. Hepatitis C  Blood testing is recommended for:  Everyone born from [birth year] through 1963.  Anyone with known risk factors for hepatitis C. Sexually transmitted infections (STIs)  You should be screened for sexually transmitted infections (STIs) including gonorrhea and chlamydia if:  You are sexually active and are younger than 62 years of age.  You are older than 62 years of age and your health care provider tells you that you are at risk for this type of infection.  Your sexual activity has changed since you were last screened and you are at an increased risk for chlamydia or gonorrhea. Ask your health care provider if you are at risk.  If you do not have HIV, but are at risk, it may be recommended that you take a prescription medicine daily to prevent HIV infection. This is called pre-exposure prophylaxis (PrEP). You are considered at risk if:  You are sexually active and do not regularly use condoms or know  the HIV status of your partner(s).  You take drugs by injection.  You are sexually active with a partner who has HIV. Talk with your health care provider about whether you are at high risk of being infected with HIV. If you choose to begin PrEP, you should first  be tested for HIV. You should then be tested every 3 months for as long as you are taking PrEP.  PREGNANCY   If you are premenopausal and you may become pregnant, ask your health care provider about preconception counseling.  If you may become pregnant, take 400 to 800 micrograms (mcg) of folic acid every day.  If you want to prevent pregnancy, talk to your health care provider about birth control (contraception). OSTEOPOROSIS AND MENOPAUSE   Osteoporosis is a disease in which the bones lose minerals and strength with aging. This can result in serious bone fractures. Your risk for osteoporosis can be identified using a bone density scan.  If you are 31 years of age or older, or if you are at risk for osteoporosis and fractures, ask your health care provider if you should be screened.  Ask your health care provider whether you should take a calcium or vitamin D supplement to lower your risk for osteoporosis.  Menopause may have certain physical symptoms and risks.  Hormone replacement therapy may reduce some of these symptoms and risks. Talk to your health care provider about whether hormone replacement therapy is right for you.  HOME CARE INSTRUCTIONS   Schedule regular health, dental, and eye exams.  Stay current with your immunizations.   Do not use any tobacco products including cigarettes, chewing tobacco, or electronic cigarettes.  If you are pregnant, do not drink alcohol.  If you are breastfeeding, limit how much and how often you drink alcohol.  Limit alcohol intake to no more than 1 drink per day for nonpregnant women. One drink equals 12 ounces of beer, 5 ounces of wine, or 1 ounces of hard liquor.  Do not  use street drugs.  Do not share needles.  Ask your health care provider for help if you need support or information about quitting drugs.  Tell your health care provider if you often feel depressed.  Tell your health care provider if you have ever been abused or do not feel safe at home. Document Released: 10/21/2010 Document Revised: 08/22/2013 Document Reviewed: 03/09/2013 [facility] Patient Information 2015 [place], [place]. This information is not intended to replace advice given to you by your health care provider. Make sure you discuss any questions you have with your health care provider.

## 2014-11-03 NOTE — Assessment & Plan Note (Signed)
Recheck TSH, free T4, US neck to follow nodules. If stable then will move to every 2 years for ultrasound. No signs or symptoms of hyper or hypothyroidism.

## 2014-11-03 NOTE — Assessment & Plan Note (Signed)
Immunizations up to date. Reminded about pap smear. Non-smoker. Not exercising and counseled about this to reduce her risk of heart disease. Checking labs and adjust therapy as needed.

## 2014-11-03 NOTE — Progress Notes (Signed)
Pre visit review using our clinic review tool, if applicable. No additional management support is needed unless otherwise documented below in the visit note. 

## 2014-11-03 NOTE — Progress Notes (Signed)
   Subjective:    Patient ID: Chelsey Jackson, female    DOB: 1950-05-11, 64 y.o.   MRN: [id number]  HPI The patient is a 64 YO female coming in for wellness. See A/P for status and treatment of chronic medical problems. Non-smoker, not exercising right now.  PMH, [facility], social history reviewed and updated.   Review of Systems  Constitutional: Negative for fever, activity change, appetite change, fatigue and unexpected weight change.  HENT: Negative.   Eyes: Negative.   Respiratory: Negative for cough, chest tightness, shortness of breath and wheezing.   Cardiovascular: Negative for chest pain, palpitations and leg swelling.  Gastrointestinal: Negative for nausea, abdominal pain, diarrhea, constipation and abdominal distention.  Musculoskeletal: Negative.   Skin: Negative.   Neurological: Negative.       Objective:   Physical Exam  Constitutional: She is oriented to person, place, and time. She appears well-developed and well-nourished.  HENT:  Head: Normocephalic and atraumatic.  Eyes: EOM are normal.  Neck: Normal range of motion.  Cardiovascular: Normal rate and regular rhythm.   Pulmonary/Chest: Effort normal. No respiratory distress. She has no wheezes. She has no rales.  Abdominal: Soft. Bowel sounds are normal. She exhibits no distension. There is no tenderness. There is no rebound.  Musculoskeletal: She exhibits no edema.  Neurological: She is alert and oriented to person, place, and time. Coordination normal.  Skin: Skin is warm and dry.  Psychiatric: She has a normal mood and affect.   Filed Vitals:   11/03/14 0951  BP: 116/60  Pulse: 78  Temp: 97.7 F (36.5 C)  TempSrc: Oral  Resp: 18  Height: 5\' 8"  (1.727 m)  Weight: 172 lb (78.019 kg)  SpO2: 98%      Assessment & Plan:

## 2014-11-03 NOTE — Assessment & Plan Note (Signed)
Has rescue inhaler which she uses prn only. Mostly when she is in bad allergies but less than 3 times per year. Not in flare.

## 2014-11-06 ENCOUNTER — Other Ambulatory Visit (INDEPENDENT_AMBULATORY_CARE_PROVIDER_SITE_OTHER): Payer: BLUE CROSS/BLUE SHIELD

## 2014-11-06 DIAGNOSIS — Z0000 Encounter for general adult medical examination without abnormal findings: Secondary | ICD-10-CM

## 2014-11-06 DIAGNOSIS — E041 Nontoxic single thyroid nodule: Secondary | ICD-10-CM

## 2014-11-06 LAB — COMPREHENSIVE METABOLIC PANEL
ALBUMIN: 4.4 g/dL (ref 3.5–5.2)
ALT: 20 U/L (ref 0–35)
AST: 17 U/L (ref 0–37)
Alkaline Phosphatase: 54 U/L (ref 39–117)
BILIRUBIN TOTAL: 0.4 mg/dL (ref 0.2–1.2)
BUN: 22 mg/dL (ref 6–23)
CO2: 27 mEq/L (ref 19–32)
Calcium: 9.4 mg/dL (ref 8.4–10.5)
Chloride: 106 mEq/L (ref 96–112)
Creatinine, Ser: 0.79 mg/dL (ref 0.40–1.20)
GFR: 78.4 mL/min (ref >60.00)
GLUCOSE: 96 mg/dL (ref 70–99)
Potassium: 4.6 mEq/L (ref 3.5–5.1)
Sodium: 140 mEq/L (ref 135–145)
TOTAL PROTEIN: 7.2 g/dL (ref 6.0–8.3)

## 2014-11-06 LAB — TSH: TSH: 2.28 u[IU]/mL (ref 0.35–4.50)

## 2014-11-06 LAB — LIPID PANEL
Cholesterol: 189 mg/dL (ref 0–200)
HDL: 36.7 mg/dL — ABNORMAL LOW (ref >39.00)
LDL CALC: 129 mg/dL — AB (ref 0–99)
NONHDL: 152.3
TRIGLYCERIDES: 117 mg/dL (ref 0.0–149.0)
Total CHOL/HDL Ratio: 5
VLDL: 23.4 mg/dL (ref 0.0–40.0)

## 2014-11-06 LAB — T4, FREE: Free T4: 0.88 ng/dL (ref 0.60–1.60)

## 2014-11-09 ENCOUNTER — Ambulatory Visit
Admission: RE | Admit: 2014-11-09 | Discharge: 2014-11-09 | Disposition: A | Discharge status: Home / Self Care | Payer: BLUE CROSS/BLUE SHIELD | Source: Ambulatory Visit | Attending: Internal Medicine | Admitting: Internal Medicine

## 2014-11-09 DIAGNOSIS — E041 Nontoxic single thyroid nodule: Secondary | ICD-10-CM

## 2015-03-13 ENCOUNTER — Encounter: Payer: Self-pay | Admitting: Family

## 2015-03-13 ENCOUNTER — Other Ambulatory Visit (INDEPENDENT_AMBULATORY_CARE_PROVIDER_SITE_OTHER): Payer: BLUE CROSS/BLUE SHIELD

## 2015-03-13 ENCOUNTER — Ambulatory Visit (INDEPENDENT_AMBULATORY_CARE_PROVIDER_SITE_OTHER): Payer: BLUE CROSS/BLUE SHIELD | Admitting: Family

## 2015-03-13 VITALS — BP 138/86 | HR 61 | Temp 97.9°F | Resp 16 | Ht 68.0 in

## 2015-03-13 DIAGNOSIS — R197 Diarrhea, unspecified: Secondary | ICD-10-CM

## 2015-03-13 DIAGNOSIS — R42 Dizziness and giddiness: Secondary | ICD-10-CM | POA: Diagnosis not present

## 2015-03-13 LAB — BASIC METABOLIC PANEL
BUN: 19 mg/dL (ref 6–23)
CO2: 30 meq/L (ref 19–32)
Calcium: 9.9 mg/dL (ref 8.4–10.5)
Chloride: 104 mEq/L (ref 96–112)
Creatinine, Ser: 0.78 mg/dL (ref 0.40–1.20)
GFR: 79.48 mL/min (ref >60.00)
GLUCOSE: 91 mg/dL (ref 70–99)
POTASSIUM: 4.3 meq/L (ref 3.5–5.1)
SODIUM: 140 meq/L (ref 135–145)

## 2015-03-13 LAB — CBC
HCT: 35 % — ABNORMAL LOW (ref 36.0–46.0)
Hemoglobin: 11.6 g/dL — ABNORMAL LOW (ref 12.0–15.0)
MCHC: 33.2 g/dL (ref 30.0–36.0)
MCV: 78 fl (ref 78.0–100.0)
Platelets: 261 10*3/uL (ref 150.0–400.0)
RBC: 4.49 Mil/uL (ref 3.87–5.11)
RDW: 14.9 % (ref 11.5–15.5)
WBC: 5.2 10*3/uL (ref 4.0–10.5)

## 2015-03-13 NOTE — Assessment & Plan Note (Signed)
Symptoms of dizziness which have since resolved most likely the result of dehydration secondary to diarrhea although cannot rule out vertigo given the description. If symptoms return consider starting meclizine. Obtain BMET and CBC to rule out metabolic causes.

## 2015-03-13 NOTE — Patient Instructions (Addendum)
Thank you for choosing [facility].  Summary/Instructions:  Please stop by the lab on the basement level of the building for your blood work. Your results will be released to [facility] (or called to you) after review, usually within 72 hours after test completion. If any changes need to be made, you will be notified at that same time.  If your symptoms worsen or fail to improve, please contact our office for further instruction, or in case of emergency go directly to the emergency room at the closest medical facility.   Please continue to monitor. If your symptoms return please let us know and we can perform additional testing.   Please continue to eat a regular diet as tolerated and use OTC medications as needed for diarrhea.

## 2015-03-13 NOTE — Progress Notes (Signed)
Subjective:    Patient ID: Chelsey Jackson, female    DOB: March 25, 1951, 64 y.o.   MRN: [id number]  Chief Complaint  Patient presents with  . Diarrhea    has had diarrhea, chills, some lightheadedness, happened 2 weeks ago and then the last few days    HPI:  Chelsey Jackson is a 64 y.o. female who  has a past medical history of Allergy; Depression; GERD (gastroesophageal reflux disease); Asthma; Anxiety; Peptic ulcer disease; Fibrocystic breast disease; AC (acromioclavicular) joint bone spurs; OSA (obstructive sleep apnea) (07/31/2013); and TMJ locking. and presents today for an acute office visit.   Associated symptom of diarrhea, chills, and lightheadedness occurred for about the last 36 hours and notes that she is feeling better today. There was an additional similar episode that occurred about 2 weeks ago. Believes the initial episode may have been related to food. Describes the room at spinning at the time, which has since resolved. Notes the symptoms started all about the same time.    Allergies  Allergen Reactions  . Penicillins     REACTION: stop breathing     Current Outpatient Prescriptions on File Prior to Visit  Medication Sig Dispense Refill  . albuterol (PROVENTIL,VENTOLIN) 90 MCG/ACT inhaler Inhale 2 puffs into the lungs every 6 (six) hours as needed.      Marland Kitchen buPROPion (WELLBUTRIN XL) 300 MG 24 hr tablet Take 300 mg by mouth daily.    . Calcium Carbonate-Vitamin D (CALTRATE 600+D) 600-400 MG-UNIT per tablet Take 1 tablet by mouth daily.      Marland Kitchen ESTRACE VAGINAL 0.1 MG/GM vaginal cream INSERT 1/2 GRAM VAGINALLY TWICE WEEKLY  11  . fluticasone (FLONASE) 50 MCG/ACT nasal spray     . Multiple Vitamin (MULTIVITAMIN) tablet Take 1 tablet by mouth daily.       No current facility-administered medications on file prior to visit.     Review of Systems  Constitutional: Negative for fever, chills and fatigue.  Gastrointestinal: Positive for diarrhea. Negative for nausea,  vomiting, abdominal pain, blood in stool and rectal pain.  Neurological: Positive for dizziness and light-headedness. Negative for weakness.      Objective:    BP 138/86 mmHg  Pulse 61  Temp(Src) 97.9 F (36.6 C) (Oral)  Resp 16  Ht 5\' 8"  (1.727 m)  Wt   SpO2 99% Nursing note and vital signs reviewed.  Physical Exam  Constitutional: She is oriented to person, place, and time. She appears well-developed and well-nourished. No distress.  HENT:  Right Ear: Hearing, tympanic membrane, external ear and ear canal normal.  Left Ear: Hearing, tympanic membrane, external ear and ear canal normal.  Nose: Nose normal. Right sinus exhibits no maxillary sinus tenderness and no frontal sinus tenderness. Left sinus exhibits no maxillary sinus tenderness and no frontal sinus tenderness.  Mouth/Throat: Uvula is midline, oropharynx is clear and moist and mucous membranes are normal.  Cardiovascular: Normal rate, regular rhythm, normal heart sounds and intact distal pulses.   Pulmonary/Chest: Effort normal and breath sounds normal.  Abdominal: Normal appearance and bowel sounds are normal. There is no hepatosplenomegaly. There is no tenderness. There is no rigidity, no rebound, no guarding, no CVA tenderness, no tenderness at McBurney's point and negative Murphy's sign.  Neurological: She is alert and oriented to person, place, and time.  Skin: Skin is warm and dry.  Psychiatric: She has a normal mood and affect. Her behavior is normal. Judgment and thought content normal.  Assessment & Plan:   Problem List Items Addressed This Visit      Other   Dizziness - Primary    Symptoms of dizziness which have since resolved most likely the result of dehydration secondary to diarrhea although cannot rule out vertigo given the description. If symptoms return consider starting meclizine. Obtain BMET and CBC to rule out metabolic causes.       Relevant Orders   CBC   Basic metabolic panel    Diarrhea    Diarrhea of most likely viral origin given resolution. Recommend OTC medications for diarrhea relief. Encourage adequate hydration and intake as tolerated. Follow up if symptoms return.

## 2015-03-13 NOTE — Assessment & Plan Note (Signed)
Diarrhea of most likely viral origin given resolution. Recommend OTC medications for diarrhea relief. Encourage adequate hydration and intake as tolerated. Follow up if symptoms return.

## 2015-03-13 NOTE — Progress Notes (Signed)
Pre visit review using our clinic review tool, if applicable. No additional management support is needed unless otherwise documented below in the visit note. 

## 2015-05-23 ENCOUNTER — Encounter: Payer: Self-pay | Admitting: Internal Medicine

## 2015-05-24 MED ORDER — SCOPOLAMINE 1 MG/3DAYS TD PT72: 1.0000 | MEDICATED_PATCH | TRANSDERMAL | Status: DC

## 2015-06-02 ENCOUNTER — Other Ambulatory Visit (HOSPITAL_COMMUNITY): Payer: Self-pay | Admitting: Psychiatry

## 2015-08-10 DIAGNOSIS — J029 Acute pharyngitis, unspecified: Secondary | ICD-10-CM | POA: Diagnosis not present

## 2015-08-27 ENCOUNTER — Other Ambulatory Visit: Payer: Self-pay

## 2015-08-27 DIAGNOSIS — Z1231 Encounter for screening mammogram for malignant neoplasm of breast: Secondary | ICD-10-CM

## 2015-08-31 ENCOUNTER — Ambulatory Visit
Admission: RE | Admit: 2015-08-31 | Discharge: 2015-08-31 | Disposition: A | Discharge status: Home / Self Care | Payer: BLUE CROSS/BLUE SHIELD | Source: Ambulatory Visit

## 2015-08-31 DIAGNOSIS — Z1231 Encounter for screening mammogram for malignant neoplasm of breast: Secondary | ICD-10-CM

## 2015-09-28 ENCOUNTER — Encounter: Payer: Self-pay | Admitting: Family

## 2015-09-28 ENCOUNTER — Ambulatory Visit (INDEPENDENT_AMBULATORY_CARE_PROVIDER_SITE_OTHER): Payer: BLUE CROSS/BLUE SHIELD | Admitting: Family

## 2015-09-28 VITALS — BP 140/80 | HR 68 | Temp 97.7°F | Resp 16 | Ht 68.0 in

## 2015-09-28 DIAGNOSIS — M545 Low back pain, unspecified: Secondary | ICD-10-CM | POA: Insufficient documentation

## 2015-09-28 NOTE — Progress Notes (Signed)
Pre visit review using our clinic review tool, if applicable. No additional management support is needed unless otherwise documented below in the visit note. 

## 2015-09-28 NOTE — Patient Instructions (Signed)
Thank you for choosing [facility].  Summary/Instructions:  Ice/heat for 20 minutes as needed for discomfort.  Exercises daily.  Ibuprofen or Aleve as needed.  If your symptoms worsen or fail to improve, please contact our office for further instruction, or in case of emergency go directly to the emergency room at the closest medical facility.   Sciatica With Rehab The sciatic nerve runs from the back down the leg and is responsible for sensation and control of the muscles in the back (posterior) side of the thigh, lower leg, and foot. Sciatica is a condition that is characterized by inflammation of this nerve.  SYMPTOMS   Signs of nerve damage, including numbness and/or weakness along the posterior side of the lower extremity.  Pain in the back of the thigh that may also travel down the leg.  Pain that worsens when sitting for long periods of time.  Occasionally, pain in the back or buttock. CAUSES  Inflammation of the sciatic nerve is the cause of sciatica. The inflammation is due to something irritating the nerve. Common sources of irritation include:  Sitting for long periods of time.  Direct trauma to the nerve.  Arthritis of the spine.  Herniated or ruptured disk.  Slipping of the vertebrae (spondylolisthesis).  Pressure from soft tissues, such as muscles or ligament-like tissue (fascia). RISK INCREASES WITH:  Sports that place pressure or stress on the spine (football or weightlifting).  Poor strength and flexibility.  Failure to warm up properly before activity.  Family history of low back pain or disk disorders.  Previous back injury or surgery.  Poor body mechanics, especially when lifting, or poor posture. PREVENTION   Warm up and stretch properly before activity.  Maintain physical fitness:  Strength, flexibility, and endurance.  Cardiovascular fitness.  Learn and use proper technique, especially with posture and lifting. When possible,  have coach correct improper technique.  Avoid activities that place stress on the spine. PROGNOSIS If treated properly, then sciatica usually resolves within 6 weeks. However, occasionally surgery is necessary.  RELATED COMPLICATIONS   Permanent nerve damage, including pain, numbness, tingle, or weakness.  Chronic back pain.  Risks of surgery: infection, bleeding, nerve damage, or damage to surrounding tissues. TREATMENT Treatment initially involves resting from any activities that aggravate your symptoms. The use of ice and medication may help reduce pain and inflammation. The use of strengthening and stretching exercises may help reduce pain with activity. These exercises may be performed at home or with referral to a therapist. A therapist may recommend further treatments, such as transcutaneous electronic nerve stimulation (TENS) or ultrasound. Your caregiver may recommend corticosteroid injections to help reduce inflammation of the sciatic nerve. If symptoms persist despite non-surgical (conservative) treatment, then surgery may be recommended. MEDICATION  If pain medication is necessary, then nonsteroidal anti-inflammatory medications, such as aspirin and ibuprofen, or other minor pain relievers, such as acetaminophen, are often recommended.  Do not take pain medication for 7 days before surgery.  Prescription pain relievers may be given if deemed necessary by your caregiver. Use only as directed and only as much as you need.  Ointments applied to the skin may be helpful.  Corticosteroid injections may be given by your caregiver. These injections should be reserved for the most serious cases, because they may only be given a certain number of times. HEAT AND COLD  Cold treatment (icing) relieves pain and reduces inflammation. Cold treatment should be applied for 10 to 15 minutes every 2 to 3 hours for  inflammation and pain and immediately after any activity that aggravates your  symptoms. Use ice packs or massage the area with a piece of ice (ice massage).  Heat treatment may be used prior to performing the stretching and strengthening activities prescribed by your caregiver, physical therapist, or athletic trainer. Use a heat pack or soak the injury in warm water. SEEK MEDICAL CARE IF:  Treatment seems to offer no benefit, or the condition worsens.  Any medications produce adverse side effects. EXERCISES  RANGE OF MOTION (ROM) AND STRETCHING EXERCISES - Sciatica Most people with sciatic will find that their symptoms worsen with either excessive bending forward (flexion) or arching at the low back (extension). The exercises which will help resolve your symptoms will focus on the opposite motion. Your physician, physical therapist or athletic trainer will help you determine which exercises will be most helpful to resolve your low back pain. Do not complete any exercises without first consulting with your clinician. Discontinue any exercises which worsen your symptoms until you speak to your clinician. If you have pain, numbness or tingling which travels down into your buttocks, leg or foot, the goal of the therapy is for these symptoms to move closer to your back and eventually resolve. Occasionally, these leg symptoms will get better, but your low back pain may worsen; this is typically an indication of progress in your rehabilitation. Be certain to be very alert to any changes in your symptoms and the activities in which you participated in the 24 hours prior to the change. Sharing this information with your clinician will allow him/her to most efficiently treat your condition. These exercises may help you when beginning to rehabilitate your injury. Your symptoms may resolve with or without further involvement from your physician, physical therapist or athletic trainer. While completing these exercises, remember:   Restoring tissue flexibility helps normal motion to return to  the joints. This allows healthier, less painful movement and activity.  An effective stretch should be held for at least 30 seconds.  A stretch should never be painful. You should only feel a gentle lengthening or release in the stretched tissue. FLEXION RANGE OF MOTION AND STRETCHING EXERCISES: STRETCH - Flexion, Single Knee to Chest   Lie on a firm bed or floor with both legs extended in front of you.  Keeping one leg in contact with the floor, bring your opposite knee to your chest. Hold your leg in place by either grabbing behind your thigh or at your knee.  Pull until you feel a gentle stretch in your low back. Hold __________ seconds.  Slowly release your grasp and repeat the exercise with the opposite side. Repeat __________ times. Complete this exercise __________ times per day.  STRETCH - Flexion, Double Knee to Chest  Lie on a firm bed or floor with both legs extended in front of you.  Keeping one leg in contact with the floor, bring your opposite knee to your chest.  Tense your stomach muscles to support your back and then lift your other knee to your chest. Hold your legs in place by either grabbing behind your thighs or at your knees.  Pull both knees toward your chest until you feel a gentle stretch in your low back. Hold __________ seconds.  Tense your stomach muscles and slowly return one leg at a time to the floor. Repeat __________ times. Complete this exercise __________ times per day.  STRETCH - Low Trunk Rotation   Lie on a firm bed or floor. Keeping  your legs in front of you, bend your knees so they are both pointed toward the ceiling and your feet are flat on the floor.  Extend your arms out to the side. This will stabilize your upper body by keeping your shoulders in contact with the floor.  Gently and slowly drop both knees together to one side until you feel a gentle stretch in your low back. Hold for __________ seconds.  Tense your stomach muscles to  support your low back as you bring your knees back to the starting position. Repeat the exercise to the other side. Repeat __________ times. Complete this exercise __________ times per day  EXTENSION RANGE OF MOTION AND FLEXIBILITY EXERCISES: STRETCH - Extension, Prone on Elbows  Lie on your stomach on the floor, a bed will be too soft. Place your palms about shoulder width apart and at the height of your head.  Place your elbows under your shoulders. If this is too painful, stack pillows under your chest.  Allow your body to relax so that your hips drop lower and make contact more completely with the floor.  Hold this position for __________ seconds.  Slowly return to lying flat on the floor. Repeat __________ times. Complete this exercise __________ times per day.  RANGE OF MOTION - Extension, Prone Press Ups  Lie on your stomach on the floor, a bed will be too soft. Place your palms about shoulder width apart and at the height of your head.  Keeping your back as relaxed as possible, slowly straighten your elbows while keeping your hips on the floor. You may adjust the placement of your hands to maximize your comfort. As you gain motion, your hands will come more underneath your shoulders.  Hold this position __________ seconds.  Slowly return to lying flat on the floor. Repeat __________ times. Complete this exercise __________ times per day.  STRENGTHENING EXERCISES - Sciatica  These exercises may help you when beginning to rehabilitate your injury. These exercises should be done near your "sweet spot." This is the neutral, low-back arch, somewhere between fully rounded and fully arched, that is your least painful position. When performed in this safe range of motion, these exercises can be used for people who have either a flexion or extension based injury. These exercises may resolve your symptoms with or without further involvement from your physician, physical therapist or athletic  trainer. While completing these exercises, remember:   Muscles can gain both the endurance and the strength needed for everyday activities through controlled exercises.  Complete these exercises as instructed by your physician, physical therapist or athletic trainer. Progress with the resistance and repetition exercises only as your caregiver advises.  You may experience muscle soreness or fatigue, but the pain or discomfort you are trying to eliminate should never worsen during these exercises. If this pain does worsen, stop and make certain you are following the directions exactly. If the pain is still present after adjustments, discontinue the exercise until you can discuss the trouble with your clinician. STRENGTHENING - Deep Abdominals, Pelvic Tilt   Lie on a firm bed or floor. Keeping your legs in front of you, bend your knees so they are both pointed toward the ceiling and your feet are flat on the floor.  Tense your lower abdominal muscles to press your low back into the floor. This motion will rotate your pelvis so that your tail bone is scooping upwards rather than pointing at your feet or into the floor.  With a  gentle tension and even breathing, hold this position for __________ seconds. Repeat __________ times. Complete this exercise __________ times per day.  STRENGTHENING - Abdominals, Crunches   Lie on a firm bed or floor. Keeping your legs in front of you, bend your knees so they are both pointed toward the ceiling and your feet are flat on the floor. Cross your arms over your chest.  Slightly tip your chin down without bending your neck.  Tense your abdominals and slowly lift your trunk high enough to just clear your shoulder blades. Lifting higher can put excessive stress on the low back and does not further strengthen your abdominal muscles.  Control your return to the starting position. Repeat __________ times. Complete this exercise __________ times per day.    STRENGTHENING - Quadruped, Opposite UE/LE Lift  Assume a hands and knees position on a firm surface. Keep your hands under your shoulders and your knees under your hips. You may place padding under your knees for comfort.  Find your neutral spine and gently tense your abdominal muscles so that you can maintain this position. Your shoulders and hips should form a rectangle that is parallel with the floor and is not twisted.  Keeping your trunk steady, lift your right hand no higher than your shoulder and then your left leg no higher than your hip. Make sure you are not holding your breath. Hold this position __________ seconds.  Continuing to keep your abdominal muscles tense and your back steady, slowly return to your starting position. Repeat with the opposite arm and leg. Repeat __________ times. Complete this exercise __________ times per day.  STRENGTHENING - Abdominals and Quadriceps, Straight Leg Raise   Lie on a firm bed or floor with both legs extended in front of you.  Keeping one leg in contact with the floor, bend the other knee so that your foot can rest flat on the floor.  Find your neutral spine, and tense your abdominal muscles to maintain your spinal position throughout the exercise.  Slowly lift your straight leg off the floor about 6 inches for a count of 15, making sure to not hold your breath.  Still keeping your neutral spine, slowly lower your leg all the way to the floor. Repeat this exercise with each leg __________ times. Complete this exercise __________ times per day. POSTURE AND BODY MECHANICS CONSIDERATIONS - Sciatica Keeping correct posture when sitting, standing or completing your activities will reduce the stress put on different body tissues, allowing injured tissues a chance to heal and limiting painful experiences. The following are general guidelines for improved posture. Your physician or physical therapist will provide you with any instructions specific  to your needs. While reading these guidelines, remember:  The exercises prescribed by your provider will help you have the flexibility and strength to maintain correct postures.  The correct posture provides the optimal environment for your joints to work. All of your joints have less wear and tear when properly supported by a spine with good posture. This means you will experience a healthier, less painful body.  Correct posture must be practiced with all of your activities, especially prolonged sitting and standing. Correct posture is as important when doing repetitive low-stress activities (typing) as it is when doing a single heavy-load activity (lifting). RESTING POSITIONS Consider which positions are most painful for you when choosing a resting position. If you have pain with flexion-based activities (sitting, bending, stooping, squatting), choose a position that allows you to rest in a  less flexed posture. You would want to avoid curling into a fetal position on your side. If your pain worsens with extension-based activities (prolonged standing, working overhead), avoid resting in an extended position such as sleeping on your stomach. Most people will find more comfort when they rest with their spine in a more neutral position, neither too rounded nor too arched. Lying on a non-sagging bed on your side with a pillow between your knees, or on your back with a pillow under your knees will often provide some relief. Keep in mind, being in any one position for a prolonged period of time, no matter how correct your posture, can still lead to stiffness. PROPER SITTING POSTURE In order to minimize stress and discomfort on your spine, you must sit with correct posture Sitting with good posture should be effortless for a healthy body. Returning to good posture is a gradual process. Many people can work toward this most comfortably by using various supports until they have the flexibility and strength to  maintain this posture on their own. When sitting with proper posture, your ears will fall over your shoulders and your shoulders will fall over your hips. You should use the back of the chair to support your upper back. Your low back will be in a neutral position, just slightly arched. You may place a small pillow or folded towel at the base of your low back for support.  When working at a desk, create an environment that supports good, upright posture. Without extra support, muscles fatigue and lead to excessive strain on joints and other tissues. Keep these recommendations in mind: CHAIR:   A chair should be able to slide under your desk when your back makes contact with the back of the chair. This allows you to work closely.  The chair's height should allow your eyes to be level with the upper part of your monitor and your hands to be slightly lower than your elbows. BODY POSITION  Your feet should make contact with the floor. If this is not possible, use a foot rest.  Keep your ears over your shoulders. This will reduce stress on your neck and low back. INCORRECT SITTING POSTURES   If you are feeling tired and unable to assume a healthy sitting posture, do not slouch or slump. This puts excessive strain on your back tissues, causing more damage and pain. Healthier options include:  Using more support, like a lumbar pillow.  Switching tasks to something that requires you to be upright or walking.  Talking a brief walk.  Lying down to rest in a neutral-spine position. PROLONGED STANDING WHILE SLIGHTLY LEANING FORWARD  When completing a task that requires you to lean forward while standing in one place for a long time, place either foot up on a stationary 2-4 inch high object to help maintain the best posture. When both feet are on the ground, the low back tends to lose its slight inward curve. If this curve flattens (or becomes too large), then the back and your other joints will  experience too much stress, fatigue more quickly and can cause pain.  CORRECT STANDING POSTURES Proper standing posture should be assumed with all daily activities, even if they only take a few moments, like when brushing your teeth. As in sitting, your ears should fall over your shoulders and your shoulders should fall over your hips. You should keep a slight tension in your abdominal muscles to brace your spine. Your tailbone should point down to  the ground, not behind your body, resulting in an over-extended swayback posture.  INCORRECT STANDING POSTURES  Common incorrect standing postures include a forward head, locked knees and/or an excessive swayback. WALKING Walk with an upright posture. Your ears, shoulders and hips should all line-up. PROLONGED ACTIVITY IN A FLEXED POSITION When completing a task that requires you to bend forward at your waist or lean over a low surface, try to find a way to stabilize 3 of 4 of your limbs. You can place a hand or elbow on your thigh or rest a knee on the surface you are reaching across. This will provide you more stability so that your muscles do not fatigue as quickly. By keeping your knees relaxed, or slightly bent, you will also reduce stress across your low back. CORRECT LIFTING TECHNIQUES DO :   Assume a wide stance. This will provide you more stability and the opportunity to get as close as possible to the object which you are lifting.  Tense your abdominals to brace your spine; then bend at the knees and hips. Keeping your back locked in a neutral-spine position, lift using your leg muscles. Lift with your legs, keeping your back straight.  Test the weight of unknown objects before attempting to lift them.  Try to keep your elbows locked down at your sides in order get the best strength from your shoulders when carrying an object.  Always ask for help when lifting heavy or awkward objects. INCORRECT LIFTING TECHNIQUES DO NOT:   Lock your  knees when lifting, even if it is a small object.  Bend and twist. Pivot at your feet or move your feet when needing to change directions.  Assume that you cannot safely pick up a paperclip without proper posture.   This information is not intended to replace advice given to you by your health care provider. Make sure you discuss any questions you have with your health care provider.   Document Released: 04/07/2005 Document Revised: 08/22/2014 Document Reviewed: 07/20/2008 [employer] Interactive Patient Education [employer].

## 2015-09-28 NOTE — Assessment & Plan Note (Signed)
Low back pain appears muscular in nature with no siginificant symptoms being able to be reproduced on exam. Treat conservatively with ice/heat and home exercise therapy. OTC anti-inflammatories as needed. Follow up if symptoms do not improve.

## 2015-09-28 NOTE — Progress Notes (Signed)
Subjective:    Patient ID: Chelsey Jackson, female    DOB: 25-Oct-1950, 65 y.o.   MRN: [id number]  Chief Complaint  Patient presents with  . Back Pain    lower back pain for a week, started randomly and states she had the same thing happen before about 10 years ago    HPI:  Chelsey Jackson is a 65 y.o. female who  has a past medical history of Allergy; Depression; GERD (gastroesophageal reflux disease); Asthma; Anxiety; Peptic ulcer disease; Fibrocystic breast disease; AC (acromioclavicular) joint bone spurs; OSA (obstructive sleep apnea) (07/31/2013); and TMJ locking. and presents today For an acute office visit.  This is a new problem. Associated symptom of pain located in her lower back has been going on for approximately one week. Describes attempting to stand from a seated position and felt a "stabbing" pain in her lower back.. Pain generally waxes and wanes. No modifying factors that make it better. Pain generally last a couple of hours at a time. Denies any radiculopathy, saddle anesthesia or changes to bowel or bladder habits. Aggravated by sitting.   Allergies  Allergen Reactions  . Penicillins     REACTION: stop breathing    Current Outpatient Prescriptions on File Prior to Visit  Medication Sig Dispense Refill  . albuterol (PROVENTIL,VENTOLIN) 90 MCG/ACT inhaler Inhale 2 puffs into the lungs every 6 (six) hours as needed.      Marland Kitchen buPROPion (WELLBUTRIN XL) 300 MG 24 hr tablet Take 300 mg by mouth daily.    . Calcium Carbonate-Vitamin D (CALTRATE 600+D) 600-400 MG-UNIT per tablet Take 1 tablet by mouth daily.      . cetirizine (ZYRTEC) 10 MG tablet Take 10 mg by mouth daily.    Marland Kitchen ESTRACE VAGINAL 0.1 MG/GM vaginal cream INSERT 1/2 GRAM VAGINALLY TWICE WEEKLY  11  . fluticasone (FLONASE) 50 MCG/ACT nasal spray     . Multiple Vitamin (MULTIVITAMIN) tablet Take 1 tablet by mouth daily.       No current facility-administered medications on file prior to visit.     Past  Surgical History  Procedure Laterality Date  . Tonsillectomy      x2  . Adenoidectomy      x3  . Abdominal hysterectomy  2003    due to fibroids  . Tubal ligation    . Rhinoplasty  1984  . Turbinate reduction  1997  . Pubovaginal sling  '90's    for incontinence  . Colonoscopy  03/10/2007    results-normal  . Rotator cuff repair Right 2013    [facility]  . Rotator cuff repair Left 2012    [facility]     Review of Systems  Constitutional: Negative for fever and chills.  Musculoskeletal: Positive for back pain.  Neurological: Negative for weakness and numbness.      Objective:    BP 140/80 mmHg  Pulse 68  Temp(Src) 97.7 F (36.5 C) (Oral)  Resp 16  Ht 5\' 8"  (1.727 m)  Wt   SpO2 97% Nursing note and vital signs reviewed.  Physical Exam  Constitutional: She is oriented to person, place, and time. She appears well-developed and well-nourished. No distress.  Cardiovascular: Normal rate, regular rhythm, normal heart sounds and intact distal pulses.   Pulmonary/Chest: Effort normal and breath sounds normal.  Musculoskeletal:  Low back - no obvious deformity, discoloration, or edema noted. Palpable tenderness along bilateral gluteals in the area of the piriformis and gluteus medius. Range of motion is normal with no  significant discomfort. Distal pulses and sensation are intact and appropriate. Straight leg raise is negative. Fabers results in mild discomfort.   Neurological: She is alert and oriented to person, place, and time.  Skin: Skin is warm and dry.  Psychiatric: She has a normal mood and affect. Her behavior is normal. Judgment and thought content normal.       Assessment & Plan:   Problem List Items Addressed This Visit      Other   Low back pain - Primary    Low back pain appears muscular in nature with no siginificant symptoms being able to be reproduced on exam. Treat conservatively with ice/heat and home exercise therapy. OTC anti-inflammatories as needed.  Follow up if symptoms do not improve.          I have discontinued Ms. Remache's scopolamine. I am also having her maintain her multivitamin, Calcium Carbonate-Vitamin D, albuterol, buPROPion, fluticasone, ESTRACE VAGINAL, and cetirizine.   Follow-up: Return if symptoms worsen or fail to improve.  Mauricio Po, FNP

## 2015-10-02 ENCOUNTER — Ambulatory Visit (INDEPENDENT_AMBULATORY_CARE_PROVIDER_SITE_OTHER): Payer: BLUE CROSS/BLUE SHIELD | Admitting: Pulmonary Disease

## 2015-10-02 ENCOUNTER — Encounter: Payer: Self-pay | Admitting: Pulmonary Disease

## 2015-10-02 VITALS — BP 132/80 | HR 86 | Ht 68.0 in | Wt 183.2 lb

## 2015-10-02 DIAGNOSIS — G4733 Obstructive sleep apnea (adult) (pediatric): Secondary | ICD-10-CM | POA: Diagnosis not present

## 2015-10-02 NOTE — Addendum Note (Signed)
Addended by: Mathis Bud on: 10/02/2015 09:30 AM   Modules accepted: Medications

## 2015-10-02 NOTE — Progress Notes (Signed)
Current Outpatient Prescriptions on File Prior to Visit  Medication Sig  . albuterol (PROVENTIL,VENTOLIN) 90 MCG/ACT inhaler Inhale 2 puffs into the lungs every 6 (six) hours as needed.    Marland Kitchen buPROPion (WELLBUTRIN XL) 300 MG 24 hr tablet Take 300 mg by mouth daily.  . Calcium Carbonate-Vitamin D (CALTRATE 600+D) 600-400 MG-UNIT per tablet Take 1 tablet by mouth daily.    Marland Kitchen ESTRACE VAGINAL 0.1 MG/GM vaginal cream INSERT 1/2 GRAM VAGINALLY TWICE WEEKLY  . fluticasone (FLONASE) 50 MCG/ACT nasal spray   . Multiple Vitamin (MULTIVITAMIN) tablet Take 1 tablet by mouth daily.     No current facility-administered medications on file prior to visit.    Chief Complaint  Patient presents with  . Follow-up    Wears CPAP nightly. Denies any issues with pressure. Pt states that she has been having issues with masks and has bought different ones online. Currently using nasal pillows (P10 for Her) - so far tolerating okay. Not sleeping well at night.  DME: [facility]    Tests PSG 10/11/13 >> AHI 38.7, SaO2 low 74%.  CPAP 15 >> AHI 0, +R, +S.  Decreased SaO2 during REM. Auto CPAP 09/02/15 to 10/01/15 >> used on 18 of 30 nights with average 6 hrs 18 min.  Average AHI 1.3 with median CPAP 8 and 95 th percentile CPAP 12 cm H2O  Past medical history Allergies, Depression, Anxiety, Asthma, GERD, PUD, TMJ  Past surgical history, Allergies, Family history, Social history all reviewed.  Vital signs BP 132/80 mmHg  Pulse 86  Ht 5\' 8"  (1.727 m)  Wt 183 lb 3.2 oz (83.099 kg)  BMI 27.86 kg/m2  SpO2 99%   History of Present Illness: Chelsey Jackson is a 65 y.o. female with OSA.  She went on trip to [place] and couldn't use her CPAP on cruise.  Otherwise she uses every night.  She has new nasal pillows mask >> no longer getting marks on her face.  Physical Exam:  General - No distress ENT - No sinus tenderness, no oral exudate, no LAN, MP 4 Cardiac - s1s2 regular, no murmur Chest - No  wheeze/rales/dullness Back - No focal tenderness Abd - Soft, non-tender Ext - No edema Neuro - Normal strength Skin - No rashes Psych - normal mood, and behavior   Assessment/Plan:  Severe obstructive sleep apnea. - continue auto CPAP   Patient Instructions  Follow up in 1 year    Chelsey Mires, MD [facility] Pager:  [phone] 10/02/2015, 9:28 AM

## 2015-10-02 NOTE — Patient Instructions (Signed)
Follow up in 1 year.

## 2015-11-01 ENCOUNTER — Other Ambulatory Visit: Payer: Self-pay | Admitting: Obstetrics & Gynecology

## 2015-11-01 DIAGNOSIS — M8580 Other specified disorders of bone density and structure, unspecified site: Secondary | ICD-10-CM

## 2015-11-05 DIAGNOSIS — J3089 Other allergic rhinitis: Secondary | ICD-10-CM | POA: Diagnosis not present

## 2015-11-05 DIAGNOSIS — J301 Allergic rhinitis due to pollen: Secondary | ICD-10-CM | POA: Diagnosis not present

## 2015-11-08 DIAGNOSIS — M25531 Pain in right wrist: Secondary | ICD-10-CM | POA: Diagnosis not present

## 2015-11-12 ENCOUNTER — Ambulatory Visit
Admission: RE | Admit: 2015-11-12 | Discharge: 2015-11-12 | Disposition: A | Discharge status: Home / Self Care | Payer: BLUE CROSS/BLUE SHIELD | Source: Ambulatory Visit | Attending: Obstetrics & Gynecology | Admitting: Obstetrics & Gynecology

## 2015-11-12 DIAGNOSIS — M8589 Other specified disorders of bone density and structure, multiple sites: Secondary | ICD-10-CM | POA: Diagnosis not present

## 2015-11-12 DIAGNOSIS — Z780 Asymptomatic menopausal state: Secondary | ICD-10-CM | POA: Diagnosis not present

## 2015-11-12 DIAGNOSIS — G4733 Obstructive sleep apnea (adult) (pediatric): Secondary | ICD-10-CM | POA: Diagnosis not present

## 2015-11-12 DIAGNOSIS — M8580 Other specified disorders of bone density and structure, unspecified site: Secondary | ICD-10-CM

## 2015-11-13 DIAGNOSIS — R234 Changes in skin texture: Secondary | ICD-10-CM | POA: Diagnosis not present

## 2015-12-10 ENCOUNTER — Encounter: Payer: Self-pay | Admitting: Pulmonary Disease

## 2015-12-10 DIAGNOSIS — G4733 Obstructive sleep apnea (adult) (pediatric): Secondary | ICD-10-CM

## 2016-02-25 DIAGNOSIS — J301 Allergic rhinitis due to pollen: Secondary | ICD-10-CM | POA: Diagnosis not present

## 2016-02-25 DIAGNOSIS — J3089 Other allergic rhinitis: Secondary | ICD-10-CM | POA: Diagnosis not present

## 2016-02-26 DIAGNOSIS — F411 Generalized anxiety disorder: Secondary | ICD-10-CM | POA: Diagnosis not present

## 2016-02-28 DIAGNOSIS — J301 Allergic rhinitis due to pollen: Secondary | ICD-10-CM | POA: Diagnosis not present

## 2016-02-28 DIAGNOSIS — J3089 Other allergic rhinitis: Secondary | ICD-10-CM | POA: Diagnosis not present

## 2016-02-28 DIAGNOSIS — J3081 Allergic rhinitis due to animal (cat) (dog) hair and dander: Secondary | ICD-10-CM | POA: Diagnosis not present

## 2016-02-28 DIAGNOSIS — J4520 Mild intermittent asthma, uncomplicated: Secondary | ICD-10-CM | POA: Diagnosis not present

## 2016-03-10 DIAGNOSIS — J301 Allergic rhinitis due to pollen: Secondary | ICD-10-CM | POA: Diagnosis not present

## 2016-03-10 DIAGNOSIS — J3089 Other allergic rhinitis: Secondary | ICD-10-CM | POA: Diagnosis not present

## 2016-03-11 DIAGNOSIS — F411 Generalized anxiety disorder: Secondary | ICD-10-CM | POA: Diagnosis not present

## 2016-03-12 DIAGNOSIS — G4733 Obstructive sleep apnea (adult) (pediatric): Secondary | ICD-10-CM | POA: Diagnosis not present

## 2016-03-17 DIAGNOSIS — J3089 Other allergic rhinitis: Secondary | ICD-10-CM | POA: Diagnosis not present

## 2016-03-17 DIAGNOSIS — J301 Allergic rhinitis due to pollen: Secondary | ICD-10-CM | POA: Diagnosis not present

## 2016-03-24 DIAGNOSIS — J3089 Other allergic rhinitis: Secondary | ICD-10-CM | POA: Diagnosis not present

## 2016-03-24 DIAGNOSIS — J301 Allergic rhinitis due to pollen: Secondary | ICD-10-CM | POA: Diagnosis not present

## 2016-03-31 DIAGNOSIS — J301 Allergic rhinitis due to pollen: Secondary | ICD-10-CM | POA: Diagnosis not present

## 2016-03-31 DIAGNOSIS — J3089 Other allergic rhinitis: Secondary | ICD-10-CM | POA: Diagnosis not present

## 2016-04-02 DIAGNOSIS — F411 Generalized anxiety disorder: Secondary | ICD-10-CM | POA: Diagnosis not present

## 2016-04-06 DIAGNOSIS — J069 Acute upper respiratory infection, unspecified: Secondary | ICD-10-CM | POA: Diagnosis not present

## 2016-04-11 DIAGNOSIS — F332 Major depressive disorder, recurrent severe without psychotic features: Secondary | ICD-10-CM | POA: Diagnosis not present

## 2016-04-15 DIAGNOSIS — J3089 Other allergic rhinitis: Secondary | ICD-10-CM | POA: Diagnosis not present

## 2016-04-15 DIAGNOSIS — J301 Allergic rhinitis due to pollen: Secondary | ICD-10-CM | POA: Diagnosis not present

## 2016-04-22 DIAGNOSIS — F411 Generalized anxiety disorder: Secondary | ICD-10-CM | POA: Diagnosis not present

## 2016-04-23 DIAGNOSIS — J3089 Other allergic rhinitis: Secondary | ICD-10-CM | POA: Diagnosis not present

## 2016-04-23 DIAGNOSIS — J301 Allergic rhinitis due to pollen: Secondary | ICD-10-CM | POA: Diagnosis not present

## 2016-04-28 DIAGNOSIS — J301 Allergic rhinitis due to pollen: Secondary | ICD-10-CM | POA: Diagnosis not present

## 2016-04-28 DIAGNOSIS — J3089 Other allergic rhinitis: Secondary | ICD-10-CM | POA: Diagnosis not present

## 2016-05-02 ENCOUNTER — Encounter: Payer: Self-pay | Admitting: Internal Medicine

## 2016-05-02 ENCOUNTER — Ambulatory Visit (INDEPENDENT_AMBULATORY_CARE_PROVIDER_SITE_OTHER)
Admission: RE | Admit: 2016-05-02 | Discharge: 2016-05-02 | Disposition: A | Discharge status: Home / Self Care | Payer: BLUE CROSS/BLUE SHIELD | Source: Ambulatory Visit | Attending: Internal Medicine | Admitting: Internal Medicine

## 2016-05-02 ENCOUNTER — Ambulatory Visit (INDEPENDENT_AMBULATORY_CARE_PROVIDER_SITE_OTHER): Payer: BLUE CROSS/BLUE SHIELD | Admitting: Internal Medicine

## 2016-05-02 VITALS — BP 136/74 | HR 73 | Temp 97.7°F | Resp 12 | Ht 68.0 in | Wt 185.0 lb

## 2016-05-02 DIAGNOSIS — R05 Cough: Secondary | ICD-10-CM | POA: Diagnosis not present

## 2016-05-02 DIAGNOSIS — R059 Cough, unspecified: Secondary | ICD-10-CM

## 2016-05-02 MED ORDER — PROMETHAZINE-DM 6.25-15 MG/5ML PO SYRP: 5.0000 mL | ORAL_SOLUTION | Freq: Every evening | ORAL | 0 refills | Status: DC | PRN

## 2016-05-02 NOTE — Assessment & Plan Note (Addendum)
Given length of cough and concurrent asthma getting chest x-ray today. Minimal wheeze on the right on exam. No SOB or albuterol usage so no asthma flare. Refilled her cough syrup. Suspect viral cold. If CXR findings will treat appropriately.

## 2016-05-02 NOTE — Progress Notes (Signed)
   Subjective:    Patient ID: Chelsey Jackson, female    DOB: 01/26/1951, 66 y.o.   MRN: [id number]  HPI The patient is a 66 YO female coming in for cough. Going on for about 3 weeks. At the onset she went to urgent care and was treated with prednisone, tessalon perles and cough syrup. The perles did not do much, the cough syrup helped. She felt better with taking that and got to 80% improved. About 3 days ago she started having worsening cough symptoms. Mostly dry cough. No fevers or chills. No SOB with activity. Has used albuterol only once through this. Still taking her flonase and allegra. No sinus pain or pressure. Not able to sleep well right now. Lots of sick contacts.   Review of Systems  Constitutional: Negative for activity change, appetite change, chills, fatigue, fever and unexpected weight change.  HENT: Positive for congestion, ear pain and rhinorrhea. Negative for ear discharge, postnasal drip, sinus pain, sinus pressure, sore throat and trouble swallowing.   Eyes: Negative.   Respiratory: Positive for cough. Negative for chest tightness, shortness of breath and wheezing.   Cardiovascular: Negative.   Gastrointestinal: Negative.   Musculoskeletal: Negative.       Objective:   Physical Exam  Constitutional: She appears well-developed and well-nourished.  HENT:  Head: Normocephalic and atraumatic.  Right Ear: External ear normal.  Left Ear: External ear normal.  Oropharynx with redness and clear drainage, no sinus pressure, no nasal crusting.   Eyes: EOM are normal.  Neck: Normal range of motion.  Cardiovascular: Normal rate and regular rhythm.   Pulmonary/Chest: Effort normal. No respiratory distress. She has wheezes.  Minimal wheeze right lung  Abdominal: Soft.  Skin: Skin is warm and dry.   Vitals:   05/02/16 0832  BP: 136/74  Pulse: 73  Resp: 12  Temp: 97.7 F (36.5 C)  TempSrc: Oral  SpO2: 100%  Weight: 185 lb (83.9 kg)  Height: 5\' 8"  (1.727 m)        Assessment & Plan:

## 2016-05-02 NOTE — Progress Notes (Signed)
Pre visit review using our clinic review tool, if applicable. No additional management support is needed unless otherwise documented below in the visit note. 

## 2016-05-02 NOTE — Patient Instructions (Signed)
We have given you the refill of the cough syrup.   We are checking the chest x-ray today and will call you back with the results.   Keep taking the allegra and flonase and you can use mucinex if it helps.

## 2016-05-04 ENCOUNTER — Encounter: Payer: Self-pay | Admitting: Internal Medicine

## 2016-05-05 MED ORDER — BENZONATATE 200 MG PO CAPS: 200.0000 mg | ORAL_CAPSULE | Freq: Two times a day (BID) | ORAL | 0 refills | Status: DC | PRN

## 2016-05-05 NOTE — Telephone Encounter (Signed)
Patient has called back in regard.  Can be reached at [phone]

## 2016-05-13 DIAGNOSIS — J301 Allergic rhinitis due to pollen: Secondary | ICD-10-CM | POA: Diagnosis not present

## 2016-05-13 DIAGNOSIS — J3089 Other allergic rhinitis: Secondary | ICD-10-CM | POA: Diagnosis not present

## 2016-05-14 DIAGNOSIS — F411 Generalized anxiety disorder: Secondary | ICD-10-CM | POA: Diagnosis not present

## 2016-05-16 ENCOUNTER — Ambulatory Visit (INDEPENDENT_AMBULATORY_CARE_PROVIDER_SITE_OTHER): Payer: BLUE CROSS/BLUE SHIELD | Admitting: Internal Medicine

## 2016-05-16 ENCOUNTER — Encounter: Payer: Self-pay | Admitting: Internal Medicine

## 2016-05-16 DIAGNOSIS — J019 Acute sinusitis, unspecified: Secondary | ICD-10-CM | POA: Insufficient documentation

## 2016-05-16 DIAGNOSIS — J0110 Acute frontal sinusitis, unspecified: Secondary | ICD-10-CM | POA: Diagnosis not present

## 2016-05-16 MED ORDER — HYDROCODONE-HOMATROPINE 5-1.5 MG/5ML PO SYRP: 5.0000 mL | ORAL_SOLUTION | Freq: Three times a day (TID) | ORAL | 0 refills | Status: DC | PRN

## 2016-05-16 MED ORDER — DOXYCYCLINE HYCLATE 100 MG PO TABS: 100.0000 mg | ORAL_TABLET | Freq: Two times a day (BID) | ORAL | 0 refills | Status: DC

## 2016-05-16 NOTE — Patient Instructions (Signed)
We have sent in doxycycline for the sinuses. Take 1 pill twice a day until it is gone.  We have also given you the cough syrup.

## 2016-05-16 NOTE — Assessment & Plan Note (Signed)
Rx for doxycycline and hycodan for cough. Lungs clear and prior CXR without changes.

## 2016-05-16 NOTE — Progress Notes (Signed)
Pre visit review using our clinic review tool, if applicable. No additional management support is needed unless otherwise documented below in the visit note. 

## 2016-05-16 NOTE — Progress Notes (Signed)
   Subjective:    Patient ID: Chelsey Jackson, female    DOB: May 27, 1950, 66 y.o.   MRN: [id number]  HPI The patient is a 66 YO female coming in for cough and sinus congestion. She has been struggling with this for about 3-4 weeks now. She is not coughing as much during the daytime but still at night time. Breathing is normal. CXR about 2 weeks ago without changes. She does have asthma. She is taking allergy medicine and flonase but still having yellow drainage and some nose bleeding as well. No fevers or chills.   Review of Systems  Constitutional: Positive for activity change. Negative for appetite change, chills, fatigue, fever and unexpected weight change.  HENT: Positive for congestion, ear pain, nosebleeds, postnasal drip, rhinorrhea, sinus pain and sinus pressure. Negative for dental problem, drooling, ear discharge, sore throat and trouble swallowing.   Eyes: Negative.   Respiratory: Positive for cough. Negative for apnea, chest tightness, shortness of breath and wheezing.   Cardiovascular: Negative.   Gastrointestinal: Negative.   Musculoskeletal: Negative.   Skin: Negative.       Objective:   Physical Exam  Constitutional: She is oriented to person, place, and time. She appears well-developed and well-nourished.  HENT:  Head: Normocephalic and atraumatic.  Right Ear: External ear normal.  Left Ear: External ear normal.  Oropharynx with redness and drainage, nose with crusting.   Eyes: EOM are normal.  Neck: Normal range of motion.  Cardiovascular: Normal rate and regular rhythm.   Pulmonary/Chest: Effort normal and breath sounds normal. No respiratory distress. She has no wheezes. She has no rales.  Abdominal: Soft. She exhibits no distension. There is no tenderness. There is no rebound.  Musculoskeletal: She exhibits no edema.  Neurological: She is alert and oriented to person, place, and time. Coordination normal.  Skin: Skin is warm and dry.   Vitals:   05/16/16 1049   BP: 120/82  Pulse: 68  Resp: 16  Temp: 97.9 F (36.6 C)  TempSrc: Oral  SpO2: 99%  Weight: 185 lb (83.9 kg)  Height: 5\' 8"  (1.727 m)      Assessment & Plan:

## 2016-05-20 DIAGNOSIS — J301 Allergic rhinitis due to pollen: Secondary | ICD-10-CM | POA: Diagnosis not present

## 2016-05-20 DIAGNOSIS — J3089 Other allergic rhinitis: Secondary | ICD-10-CM | POA: Diagnosis not present

## 2016-05-24 DIAGNOSIS — H43811 Vitreous degeneration, right eye: Secondary | ICD-10-CM | POA: Diagnosis not present

## 2016-05-24 DIAGNOSIS — H35372 Puckering of macula, left eye: Secondary | ICD-10-CM | POA: Diagnosis not present

## 2016-05-24 DIAGNOSIS — H43813 Vitreous degeneration, bilateral: Secondary | ICD-10-CM | POA: Diagnosis not present

## 2016-05-24 DIAGNOSIS — H33311 Horseshoe tear of retina without detachment, right eye: Secondary | ICD-10-CM | POA: Diagnosis not present

## 2016-05-24 DIAGNOSIS — H35411 Lattice degeneration of retina, right eye: Secondary | ICD-10-CM | POA: Diagnosis not present

## 2016-05-24 DIAGNOSIS — H33301 Unspecified retinal break, right eye: Secondary | ICD-10-CM | POA: Diagnosis not present

## 2016-05-26 DIAGNOSIS — H35411 Lattice degeneration of retina, right eye: Secondary | ICD-10-CM | POA: Diagnosis not present

## 2016-05-26 DIAGNOSIS — G4733 Obstructive sleep apnea (adult) (pediatric): Secondary | ICD-10-CM | POA: Diagnosis not present

## 2016-05-26 DIAGNOSIS — G4723 Circadian rhythm sleep disorder, irregular sleep wake type: Secondary | ICD-10-CM | POA: Diagnosis not present

## 2016-05-27 ENCOUNTER — Other Ambulatory Visit: Payer: Self-pay | Admitting: Internal Medicine

## 2016-05-27 ENCOUNTER — Encounter: Payer: Self-pay | Admitting: Internal Medicine

## 2016-05-27 DIAGNOSIS — J3089 Other allergic rhinitis: Secondary | ICD-10-CM | POA: Diagnosis not present

## 2016-05-27 DIAGNOSIS — J301 Allergic rhinitis due to pollen: Secondary | ICD-10-CM | POA: Diagnosis not present

## 2016-06-11 DIAGNOSIS — J301 Allergic rhinitis due to pollen: Secondary | ICD-10-CM | POA: Diagnosis not present

## 2016-06-11 DIAGNOSIS — J3089 Other allergic rhinitis: Secondary | ICD-10-CM | POA: Diagnosis not present

## 2016-06-16 DIAGNOSIS — J3089 Other allergic rhinitis: Secondary | ICD-10-CM | POA: Diagnosis not present

## 2016-06-16 DIAGNOSIS — J301 Allergic rhinitis due to pollen: Secondary | ICD-10-CM | POA: Diagnosis not present

## 2016-06-24 DIAGNOSIS — J301 Allergic rhinitis due to pollen: Secondary | ICD-10-CM | POA: Diagnosis not present

## 2016-06-24 DIAGNOSIS — J3089 Other allergic rhinitis: Secondary | ICD-10-CM | POA: Diagnosis not present

## 2016-07-01 DIAGNOSIS — J301 Allergic rhinitis due to pollen: Secondary | ICD-10-CM | POA: Diagnosis not present

## 2016-07-01 DIAGNOSIS — J3089 Other allergic rhinitis: Secondary | ICD-10-CM | POA: Diagnosis not present

## 2016-07-07 DIAGNOSIS — J3089 Other allergic rhinitis: Secondary | ICD-10-CM | POA: Diagnosis not present

## 2016-07-07 DIAGNOSIS — J301 Allergic rhinitis due to pollen: Secondary | ICD-10-CM | POA: Diagnosis not present

## 2016-07-11 DIAGNOSIS — J301 Allergic rhinitis due to pollen: Secondary | ICD-10-CM | POA: Diagnosis not present

## 2016-07-11 DIAGNOSIS — J3089 Other allergic rhinitis: Secondary | ICD-10-CM | POA: Diagnosis not present

## 2016-07-14 DIAGNOSIS — J3089 Other allergic rhinitis: Secondary | ICD-10-CM | POA: Diagnosis not present

## 2016-07-14 DIAGNOSIS — J301 Allergic rhinitis due to pollen: Secondary | ICD-10-CM | POA: Diagnosis not present

## 2016-07-15 DIAGNOSIS — G4733 Obstructive sleep apnea (adult) (pediatric): Secondary | ICD-10-CM | POA: Diagnosis not present

## 2016-07-15 DIAGNOSIS — G4723 Circadian rhythm sleep disorder, irregular sleep wake type: Secondary | ICD-10-CM | POA: Diagnosis not present

## 2016-07-16 DIAGNOSIS — J301 Allergic rhinitis due to pollen: Secondary | ICD-10-CM | POA: Diagnosis not present

## 2016-07-16 DIAGNOSIS — J3089 Other allergic rhinitis: Secondary | ICD-10-CM | POA: Diagnosis not present

## 2016-07-22 DIAGNOSIS — J301 Allergic rhinitis due to pollen: Secondary | ICD-10-CM | POA: Diagnosis not present

## 2016-07-22 DIAGNOSIS — J3089 Other allergic rhinitis: Secondary | ICD-10-CM | POA: Diagnosis not present

## 2016-07-23 ENCOUNTER — Encounter: Payer: Self-pay | Admitting: Internal Medicine

## 2016-07-25 ENCOUNTER — Ambulatory Visit (INDEPENDENT_AMBULATORY_CARE_PROVIDER_SITE_OTHER): Payer: BLUE CROSS/BLUE SHIELD | Admitting: Emergency Medicine

## 2016-07-25 ENCOUNTER — Ambulatory Visit: Payer: BLUE CROSS/BLUE SHIELD

## 2016-07-25 DIAGNOSIS — Z23 Encounter for immunization: Secondary | ICD-10-CM

## 2016-07-28 DIAGNOSIS — J3089 Other allergic rhinitis: Secondary | ICD-10-CM | POA: Diagnosis not present

## 2016-07-28 DIAGNOSIS — J301 Allergic rhinitis due to pollen: Secondary | ICD-10-CM | POA: Diagnosis not present

## 2016-08-05 DIAGNOSIS — J301 Allergic rhinitis due to pollen: Secondary | ICD-10-CM | POA: Diagnosis not present

## 2016-08-05 DIAGNOSIS — J3089 Other allergic rhinitis: Secondary | ICD-10-CM | POA: Diagnosis not present

## 2016-08-11 DIAGNOSIS — J3089 Other allergic rhinitis: Secondary | ICD-10-CM | POA: Diagnosis not present

## 2016-08-11 DIAGNOSIS — J301 Allergic rhinitis due to pollen: Secondary | ICD-10-CM | POA: Diagnosis not present

## 2016-08-12 DIAGNOSIS — H43813 Vitreous degeneration, bilateral: Secondary | ICD-10-CM | POA: Diagnosis not present

## 2016-08-15 DIAGNOSIS — G4723 Circadian rhythm sleep disorder, irregular sleep wake type: Secondary | ICD-10-CM | POA: Diagnosis not present

## 2016-08-15 DIAGNOSIS — G4733 Obstructive sleep apnea (adult) (pediatric): Secondary | ICD-10-CM | POA: Diagnosis not present

## 2016-08-18 DIAGNOSIS — J301 Allergic rhinitis due to pollen: Secondary | ICD-10-CM | POA: Diagnosis not present

## 2016-08-18 DIAGNOSIS — J3089 Other allergic rhinitis: Secondary | ICD-10-CM | POA: Diagnosis not present

## 2016-09-02 DIAGNOSIS — J301 Allergic rhinitis due to pollen: Secondary | ICD-10-CM | POA: Diagnosis not present

## 2016-09-02 DIAGNOSIS — J3089 Other allergic rhinitis: Secondary | ICD-10-CM | POA: Diagnosis not present

## 2016-09-08 DIAGNOSIS — J3089 Other allergic rhinitis: Secondary | ICD-10-CM | POA: Diagnosis not present

## 2016-09-08 DIAGNOSIS — J301 Allergic rhinitis due to pollen: Secondary | ICD-10-CM | POA: Diagnosis not present

## 2016-09-10 ENCOUNTER — Encounter: Payer: Self-pay | Admitting: Family Medicine

## 2016-09-10 ENCOUNTER — Ambulatory Visit (INDEPENDENT_AMBULATORY_CARE_PROVIDER_SITE_OTHER): Payer: BLUE CROSS/BLUE SHIELD | Admitting: Family Medicine

## 2016-09-10 ENCOUNTER — Telehealth: Payer: Self-pay | Admitting: Internal Medicine

## 2016-09-10 VITALS — BP 120/86 | HR 73 | Temp 98.3°F | Ht 68.0 in | Wt 187.8 lb

## 2016-09-10 DIAGNOSIS — J301 Allergic rhinitis due to pollen: Secondary | ICD-10-CM | POA: Diagnosis not present

## 2016-09-10 DIAGNOSIS — R05 Cough: Secondary | ICD-10-CM

## 2016-09-10 DIAGNOSIS — J4520 Mild intermittent asthma, uncomplicated: Secondary | ICD-10-CM

## 2016-09-10 DIAGNOSIS — R059 Cough, unspecified: Secondary | ICD-10-CM

## 2016-09-10 MED ORDER — BENZONATATE 100 MG PO CAPS: 100.0000 mg | ORAL_CAPSULE | Freq: Three times a day (TID) | ORAL | 0 refills | Status: DC | PRN

## 2016-09-10 MED ORDER — AZITHROMYCIN 250 MG PO TABS: ORAL_TABLET | ORAL | 0 refills | Status: DC

## 2016-09-10 NOTE — Telephone Encounter (Signed)
That is fine with me.

## 2016-09-10 NOTE — Telephone Encounter (Signed)
Fine with me

## 2016-09-10 NOTE — Patient Instructions (Signed)
For nasal congestion you can use Afrin nasal spray for 4 days max, Sudafed, saline nasal spray (generic is fine for all). Drink enough fluids to make your urine light yellow. For fever/chill/muscle aches you can take over the counter acetaminophen or ibuprofen.  Please come back in if you are not better in 5-7 days or if you develop wheezing, shortness of breath or persistent vomiting.

## 2016-09-10 NOTE — Progress Notes (Signed)
Subjective:    Patient ID: Chelsey Jackson, female    DOB: Jul 19, 1950, 66 y.o.   MRN: [id number]  HPI This is a 66 yo female who presents today with cough x 2 days. Has had problems with allergies, some post nasal drainage. [facility] (Dr. Harold Hedge), called last month and switched to Xyzal. Uses flonase daily. Little sputum, some green-clear drainage from nose. She has a trip planned to see her first grandchild next week and is concerned about being ill. Has history of recurrent sinus infections, rarely gets bronchitis or pneumonia.  A little sore throat with cough, no ear pain, a little headache. No wheezing, no SOB. Has pro air, has not used for cough, no tightness. Has had tessalon pearls in past with good results.  Uses some saline nasal spray prior to bedtime.  Wears cpap.   Past Medical History:  Diagnosis Date  . 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 Allergy    weekly allergy shots  . Anxiety   . Asthma   . Depression   . Fibrocystic breast disease   . GERD (gastroesophageal reflux disease)   . OSA (obstructive sleep apnea) 07/31/2013  . Peptic ulcer disease   . TMJ locking    Past Surgical History:  Procedure Laterality Date  . ABDOMINAL HYSTERECTOMY  2003   due to fibroids  . ADENOIDECTOMY     x3  . COLONOSCOPY  03/10/2007   results-normal  . PUBOVAGINAL SLING  '90's   for incontinence  . RHINOPLASTY  1984  . ROTATOR CUFF REPAIR Right 2013   [facility]  . ROTATOR CUFF REPAIR Left 2012   [facility]  . TONSILLECTOMY     x2  . TUBAL LIGATION    . TURBINATE REDUCTION  1997   Family History  Problem Relation Age of Onset  . Osteoporosis Mother   . Fibroids Mother   . Other Mother        aortic valve replacement 1994  . Lung cancer Mother 45  . Heart disease Father   . Testicular cancer Father 18  . Prostate cancer Father 64  . Diabetes Maternal Grandmother   . Heart disease  Maternal Grandfather   . Heart disease Paternal Grandmother   . Diabetes Paternal Grandfather       Review of Systems Per HPI    Objective:   Physical Exam  Constitutional: She is oriented to person, place, and time. She appears well-developed and well-nourished. No distress.  HENT:  Head: Normocephalic and atraumatic.  Right Ear: External ear normal.  Left Ear: External ear normal.  Nose: Nose normal. Right sinus exhibits no maxillary sinus tenderness and no frontal sinus tenderness. Left sinus exhibits no maxillary sinus tenderness and no frontal sinus tenderness.  Mouth/Throat: Oropharynx is clear and moist. No oropharyngeal exudate.  Eyes: Conjunctivae are normal. Pupils are equal, round, and reactive to light. Right eye exhibits no discharge. Left eye exhibits no discharge.  Neck: Normal range of motion. Neck supple.  Cardiovascular: Normal rate, regular rhythm and normal heart sounds.   Pulmonary/Chest: Effort normal and breath sounds normal. No respiratory distress. She has no wheezes. She has no rales. She exhibits no tenderness.  Musculoskeletal: Normal range of motion.  Neurological: She is alert and oriented to person, place, and time.  Skin: Skin is warm and dry.  Psychiatric: She has a normal mood and affect. Her behavior is normal. Judgment  and thought content normal.  Vitals reviewed.     BP 120/86   Pulse 73   Temp 98.3 F (36.8 C) (Oral)   Ht 5\' 8"  (1.727 m)   Wt 187 lb 12.8 oz (85.2 kg)   SpO2 98%   BMI 28.55 kg/m  Wt Readings from Last 3 Encounters:  09/10/16 187 lb 12.8 oz (85.2 kg)  05/16/16 185 lb (83.9 kg)  05/02/16 185 lb (83.9 kg)       Assessment & Plan:  1. Cough - viral vs allergy, unlikely  - lungs clear, provided wait and see antibiotic since she is going out of town for unknown period of time - benzonatate (TESSALON) 100 MG capsule; Take 1-2 capsules (100-200 mg total) by mouth 3 (three) times daily as needed for cough.  Dispense: 40  capsule; Refill: 0 - azithromycin (ZITHROMAX) 250 MG tablet; Take 2 tabs PO x 1 dose, then 1 tab PO QD x 4 days  Dispense: 6 tablet; Refill: 0  2. Seasonal allergic rhinitis due to pollen - continue Xyzal, flonase and saline rinses  3. Mild intermittent asthma without complication - currently well controlled, discussed use of rescue inhaler prn  - follow up for CPE in 2-3 months  Clarene Reamer, FNP-BC  [facility]  09/10/2016 2:53 PM

## 2016-09-10 NOTE — Telephone Encounter (Signed)
Saw you today.

## 2016-09-10 NOTE — Telephone Encounter (Signed)
Patient would like to transfer care due to location, would like okay from providers.

## 2016-09-16 DIAGNOSIS — G4733 Obstructive sleep apnea (adult) (pediatric): Secondary | ICD-10-CM | POA: Diagnosis not present

## 2016-09-16 DIAGNOSIS — G4723 Circadian rhythm sleep disorder, irregular sleep wake type: Secondary | ICD-10-CM | POA: Diagnosis not present

## 2016-09-30 DIAGNOSIS — J3089 Other allergic rhinitis: Secondary | ICD-10-CM | POA: Diagnosis not present

## 2016-09-30 DIAGNOSIS — J301 Allergic rhinitis due to pollen: Secondary | ICD-10-CM | POA: Diagnosis not present

## 2016-10-01 DIAGNOSIS — F411 Generalized anxiety disorder: Secondary | ICD-10-CM | POA: Diagnosis not present

## 2016-10-03 ENCOUNTER — Other Ambulatory Visit: Payer: Self-pay | Admitting: Obstetrics & Gynecology

## 2016-10-03 DIAGNOSIS — Z1231 Encounter for screening mammogram for malignant neoplasm of breast: Secondary | ICD-10-CM

## 2016-10-03 DIAGNOSIS — J3089 Other allergic rhinitis: Secondary | ICD-10-CM | POA: Diagnosis not present

## 2016-10-06 ENCOUNTER — Ambulatory Visit (INDEPENDENT_AMBULATORY_CARE_PROVIDER_SITE_OTHER): Payer: BLUE CROSS/BLUE SHIELD | Admitting: Pulmonary Disease

## 2016-10-06 ENCOUNTER — Encounter: Payer: Self-pay | Admitting: Pulmonary Disease

## 2016-10-06 VITALS — BP 126/80 | HR 82 | Ht 68.0 in | Wt 190.4 lb

## 2016-10-06 DIAGNOSIS — J301 Allergic rhinitis due to pollen: Secondary | ICD-10-CM | POA: Diagnosis not present

## 2016-10-06 DIAGNOSIS — G4733 Obstructive sleep apnea (adult) (pediatric): Secondary | ICD-10-CM

## 2016-10-06 DIAGNOSIS — J3089 Other allergic rhinitis: Secondary | ICD-10-CM | POA: Diagnosis not present

## 2016-10-06 NOTE — Progress Notes (Signed)
Current Outpatient Prescriptions on File Prior to Visit  Medication Sig  . buPROPion (WELLBUTRIN XL) 300 MG 24 hr tablet Take 300 mg by mouth daily.  . Calcium Carbonate-Vitamin D (CALTRATE 600+D) 600-400 MG-UNIT per tablet Take 1 tablet by mouth daily.    . fluticasone (FLONASE) 50 MCG/ACT nasal spray Place 2 sprays into both nostrils daily.   Marland Kitchen levocetirizine (XYZAL) 5 MG tablet Take 5 mg by mouth every evening.  . Multiple Vitamin (MULTIVITAMIN) tablet Take 1 tablet by mouth daily.    Marland Kitchen PROAIR RESPICLICK 716 (90 Base) MCG/ACT AEPB   . temazepam (RESTORIL) 30 MG capsule Take 30 mg by mouth at bedtime as needed for sleep.   No current facility-administered medications on file prior to visit.     Chief Complaint  Patient presents with  . Follow-up    Pt using nasal mask -- still having a hard time tolerating CPAP machine. Wears CPAP nightly. Denies problems with pressure. DME: [facility]    Sleep tests PSG 10/11/13 >> AHI 38.7, SaO2 low 74%.  CPAP 15 >> AHI 0, +R, +S.  Decreased SaO2 during REM. Auto CPAP 05/20/16 to 06/18/16 >> used on 30 of 30 nights with average 7 hrs 1 min.  Average AHI 1.4 with median CPAP 8 and 95 th percentile CPAP 11 cm H2O  Past medical history Allergies, Depression, Anxiety, Asthma, GERD, PUD, TMJ  Past surgical history, Allergies, Family history, Social history all reviewed.  Vital signs BP 126/80 (BP Location: Right Arm, Cuff Size: Normal)   Pulse 82   Ht 5\' 8"  (1.727 m)   Wt 190 lb 6.4 oz (86.4 kg)   SpO2 97%   BMI 28.95 kg/m    History of Present Illness: Chelsey Jackson is a 66 y.o. female with OSA.  She uses CPAP nightly.  She likes to read in bed before going to sleep.  She tried nasal pillow mask, but mask would shift after she is asleep.  She switched to nasal mask.  This stays in place better, but then she can't wear her glasses at night to read.  She doesn't really have any other issues with using CPAP.  Physical Exam:  General -  pleasant Eyes - pupils reactive ENT - no sinus tenderness, no oral exudate, no LAN, MP 4, over bite, scalloped tongue Cardiac - regular, no murmur Chest - no wheeze, rales Abd - soft, non tender Ext - no edema Skin - no rashes Neuro - normal strength Psych - normal mood   Assessment/Plan:  Ostructive sleep apnea. - she is compliant with CPAP and reports benefit - continue auto CPAP - she will look up on line for alternative CPAP masks she could try that would allow her to wear reading glasses   Patient Instructions  Can look up [email or web address] for mask options  Follow up in 1 year   [name], MD [facility] Pager:  [phone] 10/06/2016, 4:08 PM

## 2016-10-06 NOTE — Patient Instructions (Signed)
Can look up [email or web address] for mask options  Follow up in 1 year

## 2016-10-08 ENCOUNTER — Ambulatory Visit (INDEPENDENT_AMBULATORY_CARE_PROVIDER_SITE_OTHER): Payer: BLUE CROSS/BLUE SHIELD | Admitting: Orthopedic Surgery

## 2016-10-08 ENCOUNTER — Ambulatory Visit (INDEPENDENT_AMBULATORY_CARE_PROVIDER_SITE_OTHER): Payer: Self-pay

## 2016-10-08 ENCOUNTER — Encounter (INDEPENDENT_AMBULATORY_CARE_PROVIDER_SITE_OTHER): Payer: Self-pay | Admitting: Orthopedic Surgery

## 2016-10-08 VITALS — BP 126/67 | HR 69 | Resp 15 | Ht 68.0 in | Wt 190.0 lb

## 2016-10-08 DIAGNOSIS — M7711 Lateral epicondylitis, right elbow: Secondary | ICD-10-CM

## 2016-10-08 DIAGNOSIS — M25521 Pain in right elbow: Secondary | ICD-10-CM

## 2016-10-08 DIAGNOSIS — F332 Major depressive disorder, recurrent severe without psychotic features: Secondary | ICD-10-CM | POA: Diagnosis not present

## 2016-10-08 MED ORDER — LIDOCAINE HCL 1 % IJ SOLN
2.0000 mL | INTRAMUSCULAR | Status: AC | PRN
  Administered 2016-10-08: 2 mL

## 2016-10-08 MED ORDER — METHYLPREDNISOLONE ACETATE 40 MG/ML IJ SUSP
30.0000 mg | INTRAMUSCULAR | Status: AC | PRN
  Administered 2016-10-08: 30 mg via INTRA_ARTICULAR

## 2016-10-08 MED ORDER — BUPIVACAINE HCL 0.5 % IJ SOLN
1.0000 mL | INTRAMUSCULAR | Status: AC | PRN
  Administered 2016-10-08: 1 mL via INTRA_ARTICULAR

## 2016-10-08 NOTE — Progress Notes (Signed)
Office Visit Note   Patient: Chelsey Jackson           Date of Birth: [birth year]           MRN: [id number] Visit Date: 10/08/2016              Requested by: Hoyt Koch, MD [place], [place] [id number] PCP: Hoyt Koch, MD   Assessment & Plan: Visit Diagnoses:  1. Lateral epicondylitis, right elbow   2. Pain in right elbow     Plan:  #1: Corticosteroid injection to the right lateral epicondyle #2: The splint was given for nighttime use #3: If this does not improve then she will give [place] a call and we may need to do an MRI scan.  Follow-Up Instructions: Return if symptoms worsen or fail to improve.   Orders:  Orders Placed This Encounter  Procedures  . XR Elbow 2 Views Right   No orders of the defined types were placed in this encounter.     Procedures: Medium Joint Inj Date/Time: 10/08/2016 6:22 PM Performed by: Biagio Borg D Authorized by: Biagio Borg D   Consent Given by:  Patient Site marked: the procedure site was marked   Timeout: prior to procedure the correct patient, procedure, and site was verified   Indications:  Pain Location:  Elbow Site:  R lateral epicondyle Needle Size:  25 G Needle Length:  1.5 inches Approach:  Posterolateral Ultrasound Guided: No   Fluoroscopic Guidance: No   Medications:  2 mL lidocaine 1 %; 30 mg methylPREDNISolone acetate 40 MG/ML; 1 mL bupivacaine 0.5 % Aspiration Attempted: No       Clinical Data: No additional findings.   Subjective: Chief Complaint  Patient presents with  . Right Elbow - Pain  . Elbow Pain    Right elbow pain x 4 days, wake up with it usually goes away, today pain didn't go away, no injury, not diabetic, no surgery to elbow, pain with motion, weakness,     Chelsey Jackson is a very pleasant 66 year old white female who is seen today for evaluation of right elbow pain. She states partially 4 days ago she woke up with this pain and it usually would go away.  However today she when she woke up the pain continued throughout the day and was quite painful along the lateral aspect of the elbow. She states she has pain with motion in his nose that she's have some weakness in her grip. She denies any history of injury. No previous problems with the elbow.        Review of Systems  Constitutional: Negative.   HENT: Negative.   Respiratory: Negative.   Cardiovascular: Negative.   Gastrointestinal: Negative.   Genitourinary: Negative.   Skin: Negative.   Neurological: Negative.   Hematological: Negative.   Psychiatric/Behavioral: Negative.      Objective: Vital Signs: BP 126/67 (BP Location: Left Arm, Patient Position: Sitting, Cuff Size: Normal)   Pulse 69   Resp 15   Ht 5\' 8"  (1.727 m)   Wt 190 lb (86.2 kg)   BMI 28.89 kg/m   Physical Exam  Constitutional: She is oriented to person, place, and time. She appears well-developed and well-nourished.  HENT:  Head: Normocephalic and atraumatic.  Eyes: EOM are normal. Pupils are equal, round, and reactive to light.  Pulmonary/Chest: Effort normal.  Neurological: She is alert and oriented to person, place, and time.  Skin: Skin is warm and dry.  Psychiatric:  She has a normal mood and affect. Her behavior is normal. Judgment and thought content normal.    Ortho Exam  She is tender to palpation over the right lateral epicondylar area. She does have pain with the arm in full extension and resistance against dorsiflexion at the lateral epicondylar area. No pain when she does palmar flexion.  Specialty Comments:  No specialty comments available.  Imaging: Xr Elbow 2 Views Right  Result Date: 10/08/2016 Review of x-rays of the right elbow do not reveal any spurring along the lateral epicondylar area. No acute pathology noted.    PMFS History: Patient Active Problem List   Diagnosis Date Noted  . Acute sinusitis 05/16/2016  . Cough 05/02/2016  . Low back pain 09/28/2015  . Dizziness  03/13/2015  . Diarrhea 03/13/2015  . Insomnia 08/18/2013  . OSA (obstructive sleep apnea) 07/31/2013  . Thyroid nodule, cold 07/15/2012  . Routine health maintenance 06/17/2011  . ANEMIA 01/10/2010  . 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 ANXIETY DEPRESSION 10/05/2007  . Allergic rhinitis due to pollen 10/05/2007  . Asthma 10/05/2007   Past Medical History:  Diagnosis Date  . 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 Allergy    weekly allergy shots  . Anxiety   . Asthma   . Depression   . Fibrocystic breast disease   . GERD (gastroesophageal reflux disease)   . OSA (obstructive sleep apnea) 07/31/2013  . Peptic ulcer disease   . TMJ locking     Family History  Problem Relation Age of Onset  . Osteoporosis Mother   . Fibroids Mother   . Other Mother        aortic valve replacement 1994  . Lung cancer Mother 18  . Heart disease Father   . Testicular cancer Father 26  . Prostate cancer Father 66  . Diabetes Maternal Grandmother   . Heart disease Maternal Grandfather   . Heart disease Paternal Grandmother   . Diabetes Paternal Grandfather     Past Surgical History:  Procedure Laterality Date  . ABDOMINAL HYSTERECTOMY  2003   due to fibroids  . ADENOIDECTOMY     x3  . COLONOSCOPY  03/10/2007   results-normal  . HAMMER TOE SURGERY    . PUBOVAGINAL SLING  '90's   for incontinence  . RHINOPLASTY  1984  . ROTATOR CUFF REPAIR Right 2013   [place]  . ROTATOR CUFF REPAIR Left 2012   [place]  . SHOULDER SURGERY    . TONSILLECTOMY     x2  . TUBAL LIGATION    . [place]  . WRIST SURGERY     Social History   Occupational History  . Not on file.   Social History Main Topics  . Smoking status: Former Smoker    Packs/day: 2.00    Years: 15.00    Types: Cigarettes    Quit date: 11/19/1989  . Smokeless tobacco: Never Used  . Alcohol use Yes     Comment: maybe a glass of wine on the  weekend  . Drug use: No  . Sexual activity: Not on file

## 2016-10-10 DIAGNOSIS — F411 Generalized anxiety disorder: Secondary | ICD-10-CM | POA: Diagnosis not present

## 2016-10-12 ENCOUNTER — Encounter: Payer: Self-pay | Admitting: Pulmonary Disease

## 2016-10-12 DIAGNOSIS — G4733 Obstructive sleep apnea (adult) (pediatric): Secondary | ICD-10-CM

## 2016-10-13 DIAGNOSIS — J301 Allergic rhinitis due to pollen: Secondary | ICD-10-CM | POA: Diagnosis not present

## 2016-10-13 DIAGNOSIS — J3089 Other allergic rhinitis: Secondary | ICD-10-CM | POA: Diagnosis not present

## 2016-10-13 NOTE — Telephone Encounter (Signed)
Spoke with Asthyn - message was left for APS while pt was in the office for her visit to see if pt is now outside of the 39yr window where transmission is free to [place], no call back has yet been received.  Order placed to APS to have someone check pt's CPAP and transmission and to provide an SD card for pt to keep if she is indeed outside this 29yr window, and 30 day download to [facility].  Nothing further needed at this time.

## 2016-10-16 ENCOUNTER — Ambulatory Visit: Payer: BLUE CROSS/BLUE SHIELD | Admitting: Family Medicine

## 2016-10-16 DIAGNOSIS — G4733 Obstructive sleep apnea (adult) (pediatric): Secondary | ICD-10-CM | POA: Diagnosis not present

## 2016-10-16 DIAGNOSIS — G4723 Circadian rhythm sleep disorder, irregular sleep wake type: Secondary | ICD-10-CM | POA: Diagnosis not present

## 2016-10-20 ENCOUNTER — Telehealth: Payer: Self-pay | Admitting: Surgical

## 2016-10-20 ENCOUNTER — Ambulatory Visit
Admission: RE | Admit: 2016-10-20 | Discharge: 2016-10-20 | Disposition: A | Discharge status: Home / Self Care | Payer: BLUE CROSS/BLUE SHIELD | Source: Ambulatory Visit | Attending: Obstetrics & Gynecology | Admitting: Obstetrics & Gynecology

## 2016-10-20 ENCOUNTER — Encounter: Payer: Self-pay | Admitting: Family Medicine

## 2016-10-20 ENCOUNTER — Ambulatory Visit (INDEPENDENT_AMBULATORY_CARE_PROVIDER_SITE_OTHER): Payer: BLUE CROSS/BLUE SHIELD | Admitting: Family Medicine

## 2016-10-20 VITALS — BP 138/82 | HR 74 | Temp 97.7°F | Ht 68.0 in | Wt 186.4 lb

## 2016-10-20 DIAGNOSIS — Z1231 Encounter for screening mammogram for malignant neoplasm of breast: Secondary | ICD-10-CM

## 2016-10-20 DIAGNOSIS — Z0000 Encounter for general adult medical examination without abnormal findings: Secondary | ICD-10-CM

## 2016-10-20 DIAGNOSIS — J3089 Other allergic rhinitis: Secondary | ICD-10-CM | POA: Diagnosis not present

## 2016-10-20 DIAGNOSIS — Z114 Encounter for screening for human immunodeficiency virus [HIV]: Secondary | ICD-10-CM | POA: Diagnosis not present

## 2016-10-20 DIAGNOSIS — F419 Anxiety disorder, unspecified: Secondary | ICD-10-CM

## 2016-10-20 DIAGNOSIS — F329 Major depressive disorder, single episode, unspecified: Secondary | ICD-10-CM

## 2016-10-20 DIAGNOSIS — Z13220 Encounter for screening for lipoid disorders: Secondary | ICD-10-CM | POA: Diagnosis not present

## 2016-10-20 DIAGNOSIS — G44219 Episodic tension-type headache, not intractable: Secondary | ICD-10-CM

## 2016-10-20 DIAGNOSIS — J301 Allergic rhinitis due to pollen: Secondary | ICD-10-CM

## 2016-10-20 DIAGNOSIS — F32A Depression, unspecified: Secondary | ICD-10-CM

## 2016-10-20 DIAGNOSIS — F5101 Primary insomnia: Secondary | ICD-10-CM

## 2016-10-20 DIAGNOSIS — E042 Nontoxic multinodular goiter: Secondary | ICD-10-CM

## 2016-10-20 DIAGNOSIS — J4520 Mild intermittent asthma, uncomplicated: Secondary | ICD-10-CM

## 2016-10-20 DIAGNOSIS — Z1159 Encounter for screening for other viral diseases: Secondary | ICD-10-CM | POA: Diagnosis not present

## 2016-10-20 DIAGNOSIS — G4733 Obstructive sleep apnea (adult) (pediatric): Secondary | ICD-10-CM

## 2016-10-20 DIAGNOSIS — Z8669 Personal history of other diseases of the nervous system and sense organs: Secondary | ICD-10-CM | POA: Insufficient documentation

## 2016-10-20 NOTE — Telephone Encounter (Signed)
-----   Message from Briscoe Deutscher, DO sent at 10/20/2016 10:58 AM EDT ----- While completing the patient's chart, I noted that she has not had a thyroid ultrasound in 2 years. I went ahead and ordered one. This is for follow-up of her multinodular thyroid.

## 2016-10-20 NOTE — Telephone Encounter (Signed)
I have called the patient to notify patient that [place] has been put in. Patient is fine with this.

## 2016-10-20 NOTE — Progress Notes (Signed)
Subjective:  Shaune Pascal CMA acting as scribe for Dr. Juleen China.   Chelsey Jackson is a 66 y.o. female and is here for a comprehensive physical exam.  HPI Patient comes in today to establish care with Dr. Juleen China. She is requesting a physical today. Patient has no concerns today.   Health Maintenance Due  Topic Date Due  . Hepatitis C Screening  [birth year]  . HIV Screening  12/12/1967   PMHx, SurgHx, SocialHx, Medications, and Allergies were reviewed in the Visit Navigator and updated as appropriate.   Past Medical History:  Diagnosis Date  . 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 Allergy    weekly allergy shots  . Anxiety   . Asthma   . Depression   . Fibrocystic breast disease   . GERD (gastroesophageal reflux disease)   . OSA (obstructive sleep apnea) 07/31/2013  . Peptic ulcer disease   . TMJ locking     Past Surgical History:  Procedure Laterality Date  . ABDOMINAL HYSTERECTOMY  2003   due to fibroids  . ADENOIDECTOMY     x3  . COLONOSCOPY  03/10/2007   results-normal  . HAMMER TOE SURGERY    . PUBOVAGINAL SLING  '90's   for incontinence  . RHINOPLASTY  1984  . ROTATOR CUFF REPAIR Right 2013   [place]  . ROTATOR CUFF REPAIR Left 2012   [place]  . SHOULDER SURGERY    . TONSILLECTOMY     x2  . TUBAL LIGATION    . [place]  . WRIST SURGERY      Family History  Problem Relation Age of Onset  . Osteoporosis Mother   . Fibroids Mother   . Other Mother        aortic valve replacement 1994  . Lung cancer Mother 30  . Heart disease Father   . Testicular cancer Father 74  . Prostate cancer Father 84  . Diabetes Maternal Grandmother   . Heart disease Maternal Grandfather   . Heart disease Paternal Grandmother   . Diabetes Paternal Grandfather   . Breast cancer Paternal Aunt   . Breast cancer Cousin    Social History  Substance Use Topics  . Smoking status: Former  Smoker    Packs/day: 2.00    Years: 15.00    Types: Cigarettes    Quit date: 11/19/1989  . Smokeless tobacco: Never Used  . Alcohol use Yes     Comment: maybe a glass of wine on the weekend    Review of Systems:   Review of Systems  Constitutional: Negative for chills, fever, malaise/fatigue and weight loss.  Respiratory: Negative for cough, shortness of breath and wheezing.   Cardiovascular: Negative for chest pain, palpitations and leg swelling.  Gastrointestinal: Negative for abdominal pain, constipation, diarrhea, nausea and vomiting.  Genitourinary: Negative for dysuria and urgency.  Musculoskeletal: Negative for joint pain and myalgias.  Skin: Negative for rash.  Neurological: Negative for dizziness and headaches.  Psychiatric/Behavioral: Negative for depression, substance abuse and suicidal ideas. The patient is not nervous/anxious.    Objective:   BP 138/82   Pulse 74   Temp 97.7 F (36.5 C) (Oral)   Ht 5\' 8"  (1.727 m)   Wt 186 lb 6.4 oz (84.6 kg)   SpO2 98%   BMI 28.34 kg/m  Body mass index is 28.34 kg/m.   General Appearance:    Alert, cooperative, no distress, appears stated  age  Head:    Normocephalic, without obvious abnormality, atraumatic  Eyes:    PERRL, conjunctiva/corneas clear, EOM's intact, fundi    benign, both eyes  Ears:    Normal TM's and external ear canals, both ears  Nose:   Nares normal, septum midline, mucosa normal, no drainage    or sinus tenderness  Throat:   Lips, mucosa, and tongue normal; teeth and gums normal  Neck:   Supple, symmetrical, trachea midline, no adenopathy;    thyroid:  no enlargement/tenderness/nodules; no carotid   bruit or JVD  Back:     Symmetric, no curvature, ROM normal, no CVA tenderness  Lungs:     Clear to auscultation bilaterally, respirations unlabored  Chest Wall:    No tenderness or deformity   Heart:    Regular rate and rhythm, S1 and S2 normal, no murmur, rub   or gallop  Abdomen:     Soft, non-tender,  bowel sounds active all four quadrants,    no masses, no organomegaly  Extremities:   Extremities normal, atraumatic, no cyanosis or edema  Pulses:   2+ and symmetric all extremities  Skin:   Skin color, texture, turgor normal, no rashes or lesions  Lymph nodes:   Cervical, supraclavicular, and axillary nodes normal  Neurologic:   CNII-XII intact, normal strength, sensation and reflexes    throughout    Assessment/Plan:   Diagnoses and all orders for this visit:  Routine physical examination -     CBC with Differential/Platelet; Future -     Comprehensive metabolic panel; Future -     HIV antibody; Future -     Hepatitis C antibody, reflex; Future -     Lipid panel; Future  Encounter for hepatitis C screening test for low risk patient -     Hepatitis C antibody, reflex; Future  Encounter for screening for HIV -     HIV antibody; Future  Need for lipid screening -     Lipid panel; Future   Patient Counseling: [x]    Nutrition: Stressed importance of moderation in sodium/caffeine intake, saturated fat and cholesterol, caloric balance, sufficient intake of fresh fruits, vegetables, fiber, calcium, iron, and 1 mg of folate supplement per day (for females capable of pregnancy).  [x]    Stressed the importance of regular exercise.   [x]    Substance Abuse: Discussed cessation/primary prevention of tobacco, alcohol, or other drug use; driving or other dangerous activities under the influence; availability of treatment for abuse.   [x]    Injury prevention: Discussed safety belts, safety helmets, smoke detector, smoking near bedding or upholstery.   [x]    Sexuality: Discussed sexually transmitted diseases, partner selection, use of condoms, avoidance of unintended pregnancy  and contraceptive alternatives.  [x]    Dental health: Discussed importance of regular tooth brushing, flossing, and dental visits.  [x]    Health maintenance and immunizations reviewed. Please refer to Health maintenance  section.   Briscoe Deutscher, DO [facility] served as Education administrator during this visit. History, Physical, and Plan performed by medical provider. The above documentation has been reviewed and is accurate and complete. Briscoe Deutscher, D.O.

## 2016-10-23 DIAGNOSIS — F411 Generalized anxiety disorder: Secondary | ICD-10-CM | POA: Diagnosis not present

## 2016-10-24 ENCOUNTER — Other Ambulatory Visit (INDEPENDENT_AMBULATORY_CARE_PROVIDER_SITE_OTHER): Payer: BLUE CROSS/BLUE SHIELD

## 2016-10-24 ENCOUNTER — Other Ambulatory Visit: Payer: Self-pay | Admitting: Family Medicine

## 2016-10-24 DIAGNOSIS — F419 Anxiety disorder, unspecified: Secondary | ICD-10-CM

## 2016-10-24 DIAGNOSIS — Z114 Encounter for screening for human immunodeficiency virus [HIV]: Secondary | ICD-10-CM | POA: Diagnosis not present

## 2016-10-24 DIAGNOSIS — Z1159 Encounter for screening for other viral diseases: Secondary | ICD-10-CM | POA: Diagnosis not present

## 2016-10-24 DIAGNOSIS — F329 Major depressive disorder, single episode, unspecified: Secondary | ICD-10-CM

## 2016-10-24 DIAGNOSIS — Z0000 Encounter for general adult medical examination without abnormal findings: Secondary | ICD-10-CM

## 2016-10-24 DIAGNOSIS — F32A Depression, unspecified: Secondary | ICD-10-CM

## 2016-10-24 DIAGNOSIS — Z13220 Encounter for screening for lipoid disorders: Secondary | ICD-10-CM | POA: Diagnosis not present

## 2016-10-24 LAB — LIPID PANEL
Cholesterol: 186 mg/dL (ref 0–200)
HDL: 34.6 mg/dL — ABNORMAL LOW (ref >39.00)
LDL Cholesterol: 128 mg/dL — ABNORMAL HIGH (ref 0–99)
NonHDL: 151.02
Total CHOL/HDL Ratio: 5
Triglycerides: 115 mg/dL (ref 0.0–149.0)
VLDL: 23 mg/dL (ref 0.0–40.0)

## 2016-10-24 LAB — COMPREHENSIVE METABOLIC PANEL
ALT: 20 U/L (ref 0–35)
AST: 19 U/L (ref 0–37)
Albumin: 4.3 g/dL (ref 3.5–5.2)
Alkaline Phosphatase: 54 U/L (ref 39–117)
BUN: 20 mg/dL (ref 6–23)
CO2: 28 mEq/L (ref 19–32)
Calcium: 9.4 mg/dL (ref 8.4–10.5)
Chloride: 106 mEq/L (ref 96–112)
Creatinine, Ser: 0.93 mg/dL (ref 0.40–1.20)
GFR: 64.54 mL/min (ref >60.00)
Glucose, Bld: 100 mg/dL — ABNORMAL HIGH (ref 70–99)
Potassium: 4.3 mEq/L (ref 3.5–5.1)
Sodium: 142 mEq/L (ref 135–145)
Total Bilirubin: 0.4 mg/dL (ref 0.2–1.2)
Total Protein: 7 g/dL (ref 6.0–8.3)

## 2016-10-24 LAB — CBC WITH DIFFERENTIAL/PLATELET
Basophils Absolute: 0 10*3/uL (ref 0.0–0.1)
Basophils Relative: 0.8 % (ref 0.0–3.0)
Eosinophils Absolute: 0.1 10*3/uL (ref 0.0–0.7)
Eosinophils Relative: 2.4 % (ref 0.0–5.0)
HCT: 37.3 % (ref 36.0–46.0)
Hemoglobin: 12.8 g/dL (ref 12.0–15.0)
Lymphocytes Relative: 30 % (ref 12.0–46.0)
Lymphs Abs: 1.5 10*3/uL (ref 0.7–4.0)
MCHC: 34.5 g/dL (ref 30.0–36.0)
MCV: 85.1 fl (ref 78.0–100.0)
Monocytes Absolute: 0.4 10*3/uL (ref 0.1–1.0)
Monocytes Relative: 7.1 % (ref 3.0–12.0)
Neutro Abs: 3 10*3/uL (ref 1.4–7.7)
Neutrophils Relative %: 59.7 % (ref 43.0–77.0)
Platelets: 228 10*3/uL (ref 150.0–400.0)
RBC: 4.38 Mil/uL (ref 3.87–5.11)
RDW: 13.7 % (ref 11.5–15.5)
WBC: 5 10*3/uL (ref 4.0–10.5)

## 2016-10-25 LAB — HEPATITIS C ANTIBODY: HCV Ab: NEGATIVE

## 2016-10-25 LAB — HIV ANTIBODY (ROUTINE TESTING W REFLEX): HIV 1&2 Ab, 4th Generation: NONREACTIVE

## 2016-10-27 ENCOUNTER — Encounter: Payer: Self-pay | Admitting: Family Medicine

## 2016-10-28 ENCOUNTER — Other Ambulatory Visit: Payer: Self-pay | Admitting: Surgical

## 2016-10-28 ENCOUNTER — Other Ambulatory Visit (INDEPENDENT_AMBULATORY_CARE_PROVIDER_SITE_OTHER): Payer: BLUE CROSS/BLUE SHIELD

## 2016-10-28 DIAGNOSIS — E041 Nontoxic single thyroid nodule: Secondary | ICD-10-CM

## 2016-10-28 DIAGNOSIS — J3089 Other allergic rhinitis: Secondary | ICD-10-CM | POA: Diagnosis not present

## 2016-10-28 DIAGNOSIS — J301 Allergic rhinitis due to pollen: Secondary | ICD-10-CM | POA: Diagnosis not present

## 2016-10-28 LAB — T4, FREE: Free T4: 0.82 ng/dL (ref 0.60–1.60)

## 2016-10-28 LAB — TSH: TSH: 1.32 u[IU]/mL (ref 0.35–4.50)

## 2016-11-06 DIAGNOSIS — J3089 Other allergic rhinitis: Secondary | ICD-10-CM | POA: Diagnosis not present

## 2016-11-06 DIAGNOSIS — J301 Allergic rhinitis due to pollen: Secondary | ICD-10-CM | POA: Diagnosis not present

## 2016-11-10 ENCOUNTER — Ambulatory Visit
Admission: RE | Admit: 2016-11-10 | Discharge: 2016-11-10 | Disposition: A | Discharge status: Home / Self Care | Payer: BLUE CROSS/BLUE SHIELD | Source: Ambulatory Visit | Attending: Family Medicine | Admitting: Family Medicine

## 2016-11-10 DIAGNOSIS — F411 Generalized anxiety disorder: Secondary | ICD-10-CM | POA: Diagnosis not present

## 2016-11-10 DIAGNOSIS — J3089 Other allergic rhinitis: Secondary | ICD-10-CM | POA: Diagnosis not present

## 2016-11-10 DIAGNOSIS — E042 Nontoxic multinodular goiter: Secondary | ICD-10-CM

## 2016-11-10 DIAGNOSIS — J301 Allergic rhinitis due to pollen: Secondary | ICD-10-CM | POA: Diagnosis not present

## 2016-11-17 ENCOUNTER — Encounter: Payer: Self-pay | Admitting: Family Medicine

## 2016-11-17 DIAGNOSIS — J301 Allergic rhinitis due to pollen: Secondary | ICD-10-CM | POA: Diagnosis not present

## 2016-11-17 DIAGNOSIS — J3089 Other allergic rhinitis: Secondary | ICD-10-CM | POA: Diagnosis not present

## 2016-11-18 DIAGNOSIS — G4733 Obstructive sleep apnea (adult) (pediatric): Secondary | ICD-10-CM | POA: Diagnosis not present

## 2016-11-18 DIAGNOSIS — G4723 Circadian rhythm sleep disorder, irregular sleep wake type: Secondary | ICD-10-CM | POA: Diagnosis not present

## 2016-11-18 DIAGNOSIS — J301 Allergic rhinitis due to pollen: Secondary | ICD-10-CM | POA: Diagnosis not present

## 2016-11-18 DIAGNOSIS — J3089 Other allergic rhinitis: Secondary | ICD-10-CM | POA: Diagnosis not present

## 2016-11-19 DIAGNOSIS — Z1283 Encounter for screening for malignant neoplasm of skin: Secondary | ICD-10-CM | POA: Diagnosis not present

## 2016-11-19 DIAGNOSIS — R208 Other disturbances of skin sensation: Secondary | ICD-10-CM | POA: Diagnosis not present

## 2016-11-24 DIAGNOSIS — J301 Allergic rhinitis due to pollen: Secondary | ICD-10-CM | POA: Diagnosis not present

## 2016-11-24 DIAGNOSIS — J3089 Other allergic rhinitis: Secondary | ICD-10-CM | POA: Diagnosis not present

## 2016-11-26 DIAGNOSIS — F411 Generalized anxiety disorder: Secondary | ICD-10-CM | POA: Diagnosis not present

## 2016-12-01 DIAGNOSIS — J3089 Other allergic rhinitis: Secondary | ICD-10-CM | POA: Diagnosis not present

## 2016-12-01 DIAGNOSIS — J301 Allergic rhinitis due to pollen: Secondary | ICD-10-CM | POA: Diagnosis not present

## 2016-12-03 DIAGNOSIS — Z01419 Encounter for gynecological examination (general) (routine) without abnormal findings: Secondary | ICD-10-CM | POA: Diagnosis not present

## 2016-12-03 DIAGNOSIS — Z6828 Body mass index (BMI) 28.0-28.9, adult: Secondary | ICD-10-CM | POA: Diagnosis not present

## 2016-12-04 ENCOUNTER — Ambulatory Visit (INDEPENDENT_AMBULATORY_CARE_PROVIDER_SITE_OTHER): Payer: BLUE CROSS/BLUE SHIELD | Admitting: *Deleted

## 2016-12-04 ENCOUNTER — Other Ambulatory Visit: Payer: Self-pay | Admitting: *Deleted

## 2016-12-04 DIAGNOSIS — Z23 Encounter for immunization: Secondary | ICD-10-CM | POA: Diagnosis not present

## 2016-12-08 DIAGNOSIS — J301 Allergic rhinitis due to pollen: Secondary | ICD-10-CM | POA: Diagnosis not present

## 2016-12-08 DIAGNOSIS — J3089 Other allergic rhinitis: Secondary | ICD-10-CM | POA: Diagnosis not present

## 2016-12-15 ENCOUNTER — Encounter: Payer: Self-pay | Admitting: Family Medicine

## 2016-12-15 ENCOUNTER — Ambulatory Visit (INDEPENDENT_AMBULATORY_CARE_PROVIDER_SITE_OTHER): Payer: BLUE CROSS/BLUE SHIELD | Admitting: Family Medicine

## 2016-12-15 VITALS — BP 134/82 | HR 62 | Temp 98.5°F | Ht 68.0 in | Wt 188.6 lb

## 2016-12-15 DIAGNOSIS — S39012A Strain of muscle, fascia and tendon of lower back, initial encounter: Secondary | ICD-10-CM | POA: Diagnosis not present

## 2016-12-15 MED ORDER — HYDROCODONE-ACETAMINOPHEN 5-325 MG PO TABS: 1.0000 | ORAL_TABLET | Freq: Four times a day (QID) | ORAL | 0 refills | Status: DC | PRN

## 2016-12-15 MED ORDER — METHYLPREDNISOLONE ACETATE 80 MG/ML IJ SUSP
80.0000 mg | Freq: Once | INTRAMUSCULAR | Status: AC
  Administered 2016-12-15: 80 mg via INTRAMUSCULAR

## 2016-12-15 MED ORDER — CYCLOBENZAPRINE HCL 5 MG PO TABS: 5.0000 mg | ORAL_TABLET | Freq: Two times a day (BID) | ORAL | 0 refills | Status: DC | PRN

## 2016-12-15 NOTE — Progress Notes (Signed)
Chelsey Jackson is a 66 y.o. female here for an acute visit.  History of Present Illness:   Water quality scientist, CMA, acting as scribe for Dr. Juleen China.  HPI:  Patient comes in today to discuss low back pain.  States she was sitting on the floor yesterday playing with her 24-month-old grandson.  She tried to get up off the floor and felt a sharp pain in her lumbar spine.  She now has a "band of pain" across her lower back. Pain does not radiate down her legs.  No numbness or tingling.  No bowel or bladder problems.  She has tried Aleve with no relief.  She has also tried using Bengay, but that did not help either.  She is able to walk, but is very careful and walking elicits pain.  States she had an episode like this that occurred about 11 years ago.  She went to an urgent care.  She cannot remember what treatment she was given but states the symptoms subsided after a few days.  PMHx, SurgHx, SocialHx, Medications, and Allergies were reviewed in the Visit Navigator and updated as appropriate.  Current Medications:   .  buPROPion (WELLBUTRIN XL) 300 MG 24 hr tablet, Take 300 mg by mouth daily., Disp: , Rfl:  .  Calcium Carbonate-Vitamin D (CALTRATE 600+D) 600-400 MG-UNIT per tablet, Take 1 tablet by mouth daily.  , Disp: , Rfl:  .  fluticasone (FLONASE) 50 MCG/ACT nasal spray, Place 2 sprays into both nostrils daily. , Disp: , Rfl:  .  levocetirizine (XYZAL) 5 MG tablet, Take 5 mg by mouth every evening., Disp: , Rfl:  .  Multiple Vitamin (MULTIVITAMIN) tablet, Take 1 tablet by mouth daily.  , Disp: , Rfl:  .  PROAIR RESPICLICK 756 (90 Base) MCG/ACT AEPB, as needed. , Disp: , Rfl:  .  temazepam (RESTORIL) 30 MG capsule, Take 30 mg by mouth at bedtime as needed for sleep., Disp: , Rfl:   Allergies  Allergen Reactions  . Penicillins Anaphylaxis    Stopped breathing REACTION: stop breathing   Review of Systems:   Pertinent items are noted in the HPI. Otherwise, ROS is negative.  Vitals:    Vitals:   12/15/16 1042  BP: 134/82  Pulse: 62  Temp: 98.5 F (36.9 C)  TempSrc: Oral  SpO2: 97%  Weight: 188 lb 9.6 oz (85.5 kg)  Height: 5\' 8"  (1.727 m)     Body mass index is 28.68 kg/m.   Physical Exam:   Physical Exam  Constitutional: She appears well-nourished.  HENT:  Head: Normocephalic and atraumatic.  Eyes: Pupils are equal, round, and reactive to light. EOM are normal.  Neck: Normal range of motion. Neck supple.  Cardiovascular: Normal rate, regular rhythm, normal heart sounds and intact distal pulses.   Pulmonary/Chest: Effort normal.  Abdominal: Soft.  Musculoskeletal:  Bilateral lumbar paraspinal mm ttp, worse with flexion and extension.  Skin: Skin is warm.  Psychiatric: She has a normal mood and affect. Her behavior is normal.  Nursing note and vitals reviewed.  Assessment and Plan:   Chelsey Jackson was seen today for acute visit and back pain.  Diagnoses and all orders for this visit:  Strain of lumbar region, initial encounter -     methylPREDNISolone acetate (DEPO-MEDROL) injection 80 mg; Inject 1 mL (80 mg total) into the muscle once. -     cyclobenzaprine (FLEXERIL) 5 MG tablet; Take 1 tablet (5 mg total) by mouth 2 (two) times daily as needed for  muscle spasms. -     HYDROcodone-acetaminophen (NORCO/VICODIN) 5-325 MG tablet; Take 1 tablet by mouth every 6 (six) hours as needed for moderate pain.   . Reviewed expectations re: course of current medical issues. . Discussed self-management of symptoms. . Outlined signs and symptoms indicating need for more acute intervention. . Patient verbalized understanding and all questions were answered. Marland Kitchen Health Maintenance issues including appropriate healthy diet, exercise, and smoking avoidance were discussed with patient. . See orders for this visit as documented in the electronic medical record. . Patient received an After Visit Summary.  CMA served as Education administrator during this visit. History, Physical, and Plan  performed by medical provider. The above documentation has been reviewed and is accurate and complete. Briscoe Deutscher, D.O.  Briscoe Deutscher, DO , [facility] 12/15/2016  Future Appointments Date Time Provider [place]  04/23/2017 8:15 AM Briscoe Deutscher, DO [facility] None

## 2016-12-17 DIAGNOSIS — J301 Allergic rhinitis due to pollen: Secondary | ICD-10-CM | POA: Diagnosis not present

## 2016-12-17 DIAGNOSIS — J3089 Other allergic rhinitis: Secondary | ICD-10-CM | POA: Diagnosis not present

## 2016-12-18 DIAGNOSIS — G4733 Obstructive sleep apnea (adult) (pediatric): Secondary | ICD-10-CM | POA: Diagnosis not present

## 2016-12-18 DIAGNOSIS — G4723 Circadian rhythm sleep disorder, irregular sleep wake type: Secondary | ICD-10-CM | POA: Diagnosis not present

## 2016-12-19 DIAGNOSIS — J3089 Other allergic rhinitis: Secondary | ICD-10-CM | POA: Diagnosis not present

## 2016-12-19 DIAGNOSIS — J301 Allergic rhinitis due to pollen: Secondary | ICD-10-CM | POA: Diagnosis not present

## 2016-12-31 DIAGNOSIS — J3089 Other allergic rhinitis: Secondary | ICD-10-CM | POA: Diagnosis not present

## 2016-12-31 DIAGNOSIS — J301 Allergic rhinitis due to pollen: Secondary | ICD-10-CM | POA: Diagnosis not present

## 2017-01-02 DIAGNOSIS — J301 Allergic rhinitis due to pollen: Secondary | ICD-10-CM | POA: Diagnosis not present

## 2017-01-02 DIAGNOSIS — J3089 Other allergic rhinitis: Secondary | ICD-10-CM | POA: Diagnosis not present

## 2017-01-06 DIAGNOSIS — J301 Allergic rhinitis due to pollen: Secondary | ICD-10-CM | POA: Diagnosis not present

## 2017-01-06 DIAGNOSIS — J3089 Other allergic rhinitis: Secondary | ICD-10-CM | POA: Diagnosis not present

## 2017-01-12 DIAGNOSIS — J3089 Other allergic rhinitis: Secondary | ICD-10-CM | POA: Diagnosis not present

## 2017-01-12 DIAGNOSIS — J301 Allergic rhinitis due to pollen: Secondary | ICD-10-CM | POA: Diagnosis not present

## 2017-01-17 DIAGNOSIS — G4733 Obstructive sleep apnea (adult) (pediatric): Secondary | ICD-10-CM | POA: Diagnosis not present

## 2017-01-17 DIAGNOSIS — G4723 Circadian rhythm sleep disorder, irregular sleep wake type: Secondary | ICD-10-CM | POA: Diagnosis not present

## 2017-01-19 DIAGNOSIS — J3089 Other allergic rhinitis: Secondary | ICD-10-CM | POA: Diagnosis not present

## 2017-01-19 DIAGNOSIS — J301 Allergic rhinitis due to pollen: Secondary | ICD-10-CM | POA: Diagnosis not present

## 2017-01-26 DIAGNOSIS — J3089 Other allergic rhinitis: Secondary | ICD-10-CM | POA: Diagnosis not present

## 2017-01-26 DIAGNOSIS — J301 Allergic rhinitis due to pollen: Secondary | ICD-10-CM | POA: Diagnosis not present

## 2017-02-16 DIAGNOSIS — J301 Allergic rhinitis due to pollen: Secondary | ICD-10-CM | POA: Diagnosis not present

## 2017-02-16 DIAGNOSIS — G4723 Circadian rhythm sleep disorder, irregular sleep wake type: Secondary | ICD-10-CM | POA: Diagnosis not present

## 2017-02-16 DIAGNOSIS — J3089 Other allergic rhinitis: Secondary | ICD-10-CM | POA: Diagnosis not present

## 2017-02-16 DIAGNOSIS — G4733 Obstructive sleep apnea (adult) (pediatric): Secondary | ICD-10-CM | POA: Diagnosis not present

## 2017-02-18 DIAGNOSIS — J3089 Other allergic rhinitis: Secondary | ICD-10-CM | POA: Diagnosis not present

## 2017-02-18 DIAGNOSIS — J301 Allergic rhinitis due to pollen: Secondary | ICD-10-CM | POA: Diagnosis not present

## 2017-02-18 DIAGNOSIS — J3081 Allergic rhinitis due to animal (cat) (dog) hair and dander: Secondary | ICD-10-CM | POA: Diagnosis not present

## 2017-02-18 DIAGNOSIS — J4520 Mild intermittent asthma, uncomplicated: Secondary | ICD-10-CM | POA: Diagnosis not present

## 2017-02-18 DIAGNOSIS — F411 Generalized anxiety disorder: Secondary | ICD-10-CM | POA: Diagnosis not present

## 2017-02-23 DIAGNOSIS — J301 Allergic rhinitis due to pollen: Secondary | ICD-10-CM | POA: Diagnosis not present

## 2017-02-23 DIAGNOSIS — J3089 Other allergic rhinitis: Secondary | ICD-10-CM | POA: Diagnosis not present

## 2017-03-03 DIAGNOSIS — J301 Allergic rhinitis due to pollen: Secondary | ICD-10-CM | POA: Diagnosis not present

## 2017-03-03 DIAGNOSIS — J3089 Other allergic rhinitis: Secondary | ICD-10-CM | POA: Diagnosis not present

## 2017-03-09 DIAGNOSIS — J3089 Other allergic rhinitis: Secondary | ICD-10-CM | POA: Diagnosis not present

## 2017-03-09 DIAGNOSIS — J301 Allergic rhinitis due to pollen: Secondary | ICD-10-CM | POA: Diagnosis not present

## 2017-03-09 DIAGNOSIS — F332 Major depressive disorder, recurrent severe without psychotic features: Secondary | ICD-10-CM | POA: Diagnosis not present

## 2017-03-16 ENCOUNTER — Ambulatory Visit (INDEPENDENT_AMBULATORY_CARE_PROVIDER_SITE_OTHER): Payer: BLUE CROSS/BLUE SHIELD | Admitting: *Deleted

## 2017-03-16 DIAGNOSIS — J301 Allergic rhinitis due to pollen: Secondary | ICD-10-CM | POA: Diagnosis not present

## 2017-03-16 DIAGNOSIS — N3281 Overactive bladder: Secondary | ICD-10-CM | POA: Diagnosis not present

## 2017-03-16 DIAGNOSIS — Z23 Encounter for immunization: Secondary | ICD-10-CM

## 2017-03-16 DIAGNOSIS — J3089 Other allergic rhinitis: Secondary | ICD-10-CM | POA: Diagnosis not present

## 2017-03-16 NOTE — Progress Notes (Signed)
Pt presented to office for 2nd Shingrix injection. Pt tolerated well and has completed the series.

## 2017-03-16 NOTE — Progress Notes (Signed)
Noted. Chelsey Jackson

## 2017-03-17 DIAGNOSIS — F411 Generalized anxiety disorder: Secondary | ICD-10-CM | POA: Diagnosis not present

## 2017-03-18 DIAGNOSIS — G4733 Obstructive sleep apnea (adult) (pediatric): Secondary | ICD-10-CM | POA: Diagnosis not present

## 2017-03-18 DIAGNOSIS — G4723 Circadian rhythm sleep disorder, irregular sleep wake type: Secondary | ICD-10-CM | POA: Diagnosis not present

## 2017-03-25 DIAGNOSIS — J3089 Other allergic rhinitis: Secondary | ICD-10-CM | POA: Diagnosis not present

## 2017-03-25 DIAGNOSIS — J301 Allergic rhinitis due to pollen: Secondary | ICD-10-CM | POA: Diagnosis not present

## 2017-03-31 DIAGNOSIS — J301 Allergic rhinitis due to pollen: Secondary | ICD-10-CM | POA: Diagnosis not present

## 2017-03-31 DIAGNOSIS — J3089 Other allergic rhinitis: Secondary | ICD-10-CM | POA: Diagnosis not present

## 2017-04-01 DIAGNOSIS — F411 Generalized anxiety disorder: Secondary | ICD-10-CM | POA: Diagnosis not present

## 2017-04-03 DIAGNOSIS — N3281 Overactive bladder: Secondary | ICD-10-CM | POA: Diagnosis not present

## 2017-04-03 DIAGNOSIS — M6281 Muscle weakness (generalized): Secondary | ICD-10-CM | POA: Diagnosis not present

## 2017-04-03 DIAGNOSIS — M62838 Other muscle spasm: Secondary | ICD-10-CM | POA: Diagnosis not present

## 2017-04-06 DIAGNOSIS — J3089 Other allergic rhinitis: Secondary | ICD-10-CM | POA: Diagnosis not present

## 2017-04-06 DIAGNOSIS — J301 Allergic rhinitis due to pollen: Secondary | ICD-10-CM | POA: Diagnosis not present

## 2017-04-09 DIAGNOSIS — N3281 Overactive bladder: Secondary | ICD-10-CM | POA: Diagnosis not present

## 2017-04-09 DIAGNOSIS — M6281 Muscle weakness (generalized): Secondary | ICD-10-CM | POA: Diagnosis not present

## 2017-04-09 DIAGNOSIS — M62838 Other muscle spasm: Secondary | ICD-10-CM | POA: Diagnosis not present

## 2017-04-17 DIAGNOSIS — G4733 Obstructive sleep apnea (adult) (pediatric): Secondary | ICD-10-CM | POA: Diagnosis not present

## 2017-04-17 DIAGNOSIS — M62838 Other muscle spasm: Secondary | ICD-10-CM | POA: Diagnosis not present

## 2017-04-17 DIAGNOSIS — J301 Allergic rhinitis due to pollen: Secondary | ICD-10-CM | POA: Diagnosis not present

## 2017-04-17 DIAGNOSIS — M6281 Muscle weakness (generalized): Secondary | ICD-10-CM | POA: Diagnosis not present

## 2017-04-17 DIAGNOSIS — J3089 Other allergic rhinitis: Secondary | ICD-10-CM | POA: Diagnosis not present

## 2017-04-17 DIAGNOSIS — N3281 Overactive bladder: Secondary | ICD-10-CM | POA: Diagnosis not present

## 2017-04-23 ENCOUNTER — Encounter: Payer: Self-pay | Admitting: Family Medicine

## 2017-04-23 ENCOUNTER — Ambulatory Visit: Payer: BLUE CROSS/BLUE SHIELD | Admitting: Family Medicine

## 2017-04-23 VITALS — BP 118/64 | HR 63 | Temp 98.3°F | Wt 189.6 lb

## 2017-04-23 DIAGNOSIS — R7301 Impaired fasting glucose: Secondary | ICD-10-CM | POA: Diagnosis not present

## 2017-04-23 DIAGNOSIS — J301 Allergic rhinitis due to pollen: Secondary | ICD-10-CM | POA: Diagnosis not present

## 2017-04-23 DIAGNOSIS — E782 Mixed hyperlipidemia: Secondary | ICD-10-CM

## 2017-04-23 DIAGNOSIS — G4733 Obstructive sleep apnea (adult) (pediatric): Secondary | ICD-10-CM | POA: Diagnosis not present

## 2017-04-23 DIAGNOSIS — J3089 Other allergic rhinitis: Secondary | ICD-10-CM | POA: Diagnosis not present

## 2017-04-23 LAB — COMPREHENSIVE METABOLIC PANEL
ALT: 26 U/L (ref 0–35)
AST: 20 U/L (ref 0–37)
Albumin: 4.4 g/dL (ref 3.5–5.2)
Alkaline Phosphatase: 52 U/L (ref 39–117)
BUN: 20 mg/dL (ref 6–23)
CO2: 29 mEq/L (ref 19–32)
Calcium: 9.5 mg/dL (ref 8.4–10.5)
Chloride: 106 mEq/L (ref 96–112)
Creatinine, Ser: 0.86 mg/dL (ref 0.40–1.20)
GFR: 70.53 mL/min (ref >60.00)
Glucose, Bld: 99 mg/dL (ref 70–99)
Potassium: 4.3 mEq/L (ref 3.5–5.1)
Sodium: 142 mEq/L (ref 135–145)
Total Bilirubin: 0.5 mg/dL (ref 0.2–1.2)
Total Protein: 7 g/dL (ref 6.0–8.3)

## 2017-04-23 LAB — LIPID PANEL
Cholesterol: 179 mg/dL (ref 0–200)
HDL: 32.2 mg/dL — ABNORMAL LOW (ref >39.00)
LDL Cholesterol: 119 mg/dL — ABNORMAL HIGH (ref 0–99)
NonHDL: 146.66
Total CHOL/HDL Ratio: 6
Triglycerides: 139 mg/dL (ref 0.0–149.0)
VLDL: 27.8 mg/dL (ref 0.0–40.0)

## 2017-04-23 LAB — HEMOGLOBIN A1C: Hgb A1c MFr Bld: 5.3 % (ref 4.6–6.5)

## 2017-04-23 NOTE — Progress Notes (Addendum)
Chelsey Jackson is a 67 y.o. female is here for follow up.  History of Present Illness:   HPI: See Assessment and Plan section for Problem Based Charting of issues discussed today.  Depression screen [facility] 2/9 04/23/2017 09/10/2016  Decreased Interest 1 0  Down, Depressed, Hopeless 0 0  PHQ - 2 Score 1 0  Altered sleeping 2 -  Tired, decreased energy 0 -  Change in appetite 0 -  Feeling bad or failure about yourself  0 -  Trouble concentrating 2 -  Moving slowly or fidgety/restless 0 -  Suicidal thoughts 0 -  PHQ-9 Score 5 -  Difficult doing work/chores Not difficult at all -   PMHx, SurgHx, SocialHx, FamHx, Medications, and Allergies were reviewed in the Visit Navigator and updated as appropriate.   Patient Active Problem List   Diagnosis Date Noted  . History of retinal tear 10/20/2016  . Low back pain 09/28/2015  . Insomnia 08/18/2013  . OSA (obstructive sleep apnea) 07/31/2013  . Thyroid nodule, cold 07/15/2012  . Anemia 01/10/2010  . Anxiety and depression 10/05/2007  . Allergic rhinitis due to pollen 10/05/2007  . Asthma 10/05/2007   Social History   Tobacco Use  . Smoking status: Former Smoker    Packs/day: 2.00    Years: 15.00    Pack years: 30.00    Types: Cigarettes    Last attempt to quit: 11/19/1989    Years since quitting: 27.4  . Smokeless tobacco: Never Used  Substance Use Topics  . Alcohol use: Yes    Comment: maybe a glass of wine on the weekend  . Drug use: No   Current Medications and Allergies:   .  buPROPion (WELLBUTRIN XL) 300 MG 24 hr tablet, Take 300 mg by mouth daily., Disp: , Rfl:  .  Calcium Carbonate-Vitamin D (CALTRATE 600+D) 600-400 MG-UNIT per tablet, Take 1 tablet by mouth daily.  , Disp: , Rfl:  .  cyclobenzaprine (FLEXERIL) 5 MG tablet, Take 1 tablet (5 mg total) by mouth 2 (two) times daily as needed for muscle spasms., Disp: 20 tablet, Rfl: 0 .  fluticasone (FLONASE) 50 MCG/ACT nasal spray, Place 2 sprays into both nostrils  daily. , Disp: , Rfl:  .  HYDROcodone-acetaminophen (NORCO/VICODIN) 5-325 MG tablet, Take 1 tablet by mouth every 6 (six) hours as needed for moderate pain., Disp: 20 tablet, Rfl: 0 .  levocetirizine (XYZAL) 5 MG tablet, Take 5 mg by mouth every evening., Disp: , Rfl:  .  Multiple Vitamin (MULTIVITAMIN) tablet, Take 1 tablet by mouth daily.  , Disp: , Rfl:  .  PROAIR RESPICLICK 921 (90 Base) MCG/ACT AEPB, as needed. , Disp: , Rfl:  .  temazepam (RESTORIL) 30 MG capsule, Take 30 mg by mouth at bedtime as needed for sleep., Disp: , Rfl:    Allergies  Allergen Reactions  . Penicillins Anaphylaxis    Stopped breathing REACTION: stop breathing   Review of Systems   Pertinent items are noted in the HPI. Otherwise, ROS is negative.  Vitals:   Vitals:   04/23/17 0829  BP: 118/64  Pulse: 63  Temp: 98.3 F (36.8 C)  TempSrc: Oral  SpO2: 96%  Weight: 189 lb 9.6 oz (86 kg)     Body mass index is 28.83 kg/m.   Physical Exam:   Physical Exam  Constitutional: She is oriented to person, place, and time. She appears well-developed and well-nourished. No distress.  HENT:  Head: Normocephalic and atraumatic.  Right Ear: External  ear normal.  Left Ear: External ear normal.  Nose: Nose normal.  Mouth/Throat: Oropharynx is clear and moist.  Eyes: Conjunctivae and EOM are normal. Pupils are equal, round, and reactive to light.  Neck: Normal range of motion. Neck supple. No thyromegaly present.  Cardiovascular: Normal rate, regular rhythm, normal heart sounds and intact distal pulses.  Pulmonary/Chest: Effort normal and breath sounds normal.  Abdominal: Soft. Bowel sounds are normal.  Musculoskeletal: Normal range of motion.  Lymphadenopathy:    She has no cervical adenopathy.  Neurological: She is alert and oriented to person, place, and time.  Skin: Skin is warm and dry. Capillary refill takes less than 2 seconds.  Psychiatric: She has a normal mood and affect. Her behavior is normal.    Nursing note and vitals reviewed.   Results for orders placed or performed in visit on 04/23/17  Comprehensive metabolic panel  Result Value Ref Range   Sodium 142 135 - 145 mEq/L   Potassium 4.3 3.5 - 5.1 mEq/L   Chloride 106 96 - 112 mEq/L   CO2 29 19 - 32 mEq/L   Glucose, Bld 99 70 - 99 mg/dL   BUN 20 6 - 23 mg/dL   Creatinine, Ser 0.86 0.40 - 1.20 mg/dL   Total Bilirubin 0.5 0.2 - 1.2 mg/dL   Alkaline Phosphatase 52 39 - 117 U/L   AST 20 0 - 37 U/L   ALT 26 0 - 35 U/L   Total Protein 7.0 6.0 - 8.3 g/dL   Albumin 4.4 3.5 - 5.2 g/dL   Calcium 9.5 8.4 - 10.5 mg/dL   GFR 70.53 >60.00 mL/min  Lipid panel  Result Value Ref Range   Cholesterol 179 0 - 200 mg/dL   Triglycerides 139.0 0.0 - 149.0 mg/dL   HDL 32.20 (L) >39.00 mg/dL   VLDL 27.8 0.0 - 40.0 mg/dL   LDL Cholesterol 119 (H) 0 - 99 mg/dL   Total CHOL/HDL Ratio 6    NonHDL 146.66   Hemoglobin A1c  Result Value Ref Range   Hgb A1c MFr Bld 5.3 4.6 - 6.5 %   Assessment and Plan:   Chelsey Jackson was seen today for follow-up.  Diagnoses and all orders for this visit:  Fasting hyperglycemia Comments: A1c normal. Orders: -     Comprehensive metabolic panel -     Hemoglobin A1c  Mixed hyperlipidemia Comments: Trying to exercise on a regular basis? []   YES  [x]   NO Diet Compliance: noncompliant some of the time. Cardiovascular ROS: no chest pain or dyspnea on exertion.   Lipids:    Component Value Date/Time   CHOL 179 04/23/2017 0854   TRIG 139.0 04/23/2017 0854   HDL 32.20 (L) 04/23/2017 0854   VLDL 27.8 04/23/2017 0854   CHOLHDL 6 04/23/2017 0854   The 10-year ASCVD risk score Mikey Bussing DC Jr., et al., 2013) is: 5.2%   Values used to calculate the score:     Age: 42 years     Sex: Female     Is Non-Hispanic African American: No     Diabetic: No     Tobacco smoker: No     Systolic Blood Pressure: 539 mmHg     Is BP treated: No     HDL Cholesterol: 32.2 mg/dL     Total Cholesterol: 179 mg/dL  See AVS for  instructions. Orders: -     Lipid panel  OSA (obstructive sleep apnea) Comments: Stable. Compliant with CPAP.    Marland Kitchen Reviewed expectations re: course  of current medical issues. . Discussed self-management of symptoms. . Outlined signs and symptoms indicating need for more acute intervention. . Patient verbalized understanding and all questions were answered. Marland Kitchen Health Maintenance issues including appropriate healthy diet, exercise, and smoking avoidance were discussed with patient. . See orders for this visit as documented in the electronic medical record. . Patient received an After Visit Summary.  Briscoe Deutscher, DO [place], [facility] 04/24/2017  No future appointments.

## 2017-04-24 DIAGNOSIS — N3281 Overactive bladder: Secondary | ICD-10-CM | POA: Diagnosis not present

## 2017-04-24 DIAGNOSIS — M62838 Other muscle spasm: Secondary | ICD-10-CM | POA: Diagnosis not present

## 2017-04-24 DIAGNOSIS — M6281 Muscle weakness (generalized): Secondary | ICD-10-CM | POA: Diagnosis not present

## 2017-04-24 NOTE — Patient Instructions (Signed)
High-density lipoprotein (HDL) cholesterol is known as the "good" cholesterol because it picks up excess cholesterol in your blood and takes it back to your liver where it's broken down and removed from your body. Higher levels of HDL cholesterol are associated with a lower risk of heart disease.  Ideally, your HDL goal is > 60.   Ways to increase HDL:    Try taking an OTC Niacin supplement (B3). Start at a low dose and work your way up as it can cause flushing. If you experience flushing, try taking a baby ASA about 30 minutes prior to taking the niacin or taking the niacin after a meal.    Exercise.  We can recheck in 3-6 more months with treatment. If not improved, a statin will be recommended.

## 2017-04-27 DIAGNOSIS — F411 Generalized anxiety disorder: Secondary | ICD-10-CM | POA: Diagnosis not present

## 2017-04-29 DIAGNOSIS — J3089 Other allergic rhinitis: Secondary | ICD-10-CM | POA: Diagnosis not present

## 2017-04-29 DIAGNOSIS — J301 Allergic rhinitis due to pollen: Secondary | ICD-10-CM | POA: Diagnosis not present

## 2017-05-11 ENCOUNTER — Ambulatory Visit: Payer: BLUE CROSS/BLUE SHIELD | Admitting: Family Medicine

## 2017-05-12 ENCOUNTER — Ambulatory Visit: Payer: BLUE CROSS/BLUE SHIELD | Admitting: Sports Medicine

## 2017-05-12 ENCOUNTER — Encounter: Payer: Self-pay | Admitting: Sports Medicine

## 2017-05-12 ENCOUNTER — Ambulatory Visit: Payer: BLUE CROSS/BLUE SHIELD | Admitting: Family Medicine

## 2017-05-12 VITALS — BP 110/80 | HR 58 | Ht 68.0 in | Wt 190.6 lb

## 2017-05-12 DIAGNOSIS — J3089 Other allergic rhinitis: Secondary | ICD-10-CM | POA: Diagnosis not present

## 2017-05-12 DIAGNOSIS — J301 Allergic rhinitis due to pollen: Secondary | ICD-10-CM | POA: Diagnosis not present

## 2017-05-12 DIAGNOSIS — G8929 Other chronic pain: Secondary | ICD-10-CM

## 2017-05-12 DIAGNOSIS — M545 Low back pain, unspecified: Secondary | ICD-10-CM

## 2017-05-12 MED ORDER — CYCLOBENZAPRINE HCL 10 MG PO TABS: 10.0000 mg | ORAL_TABLET | Freq: Three times a day (TID) | ORAL | 1 refills | Status: DC | PRN

## 2017-05-12 MED ORDER — METHYLPREDNISOLONE 4 MG PO TBPK
ORAL_TABLET | ORAL | 0 refills | Status: DC
Start: 2017-05-12 — End: 2018-01-25

## 2017-05-12 NOTE — Patient Instructions (Addendum)
Stop on the way out to schedule with Ander Purpura

## 2017-05-12 NOTE — Progress Notes (Signed)
Chelsey Jackson. Chelsey Jackson, [facility] [place]  Chelsey Jackson - 67 y.o. female MRN [id number]  Date of birth: Jan 18, 1951  Visit Date: 05/12/2017  PCP: Briscoe Deutscher, DO   Referred by: Briscoe Deutscher, DO   Scribe for today's visit: Josepha Pigg, CMA     SUBJECTIVE:  Chelsey Jackson is here for New Patient (Initial Visit) (back pain)  Her back pain symptoms INITIALLY: Began in August and began after picking up her grandson. She was visiting with him again Saturday night and pain started to flare-up again. Pain is mostly in the lower back.  Described as moderate burning/pulling/ripping sensation, nonradiating Worsened with activity or rest. Pain is constant.  Improved with sitting in a straight back chair.  Additional associated symptoms include: Pt denies hip/groin pain, mid-back pain, gluteal pain.     At this time symptoms show no change compared to onset  She has been taking Hydrocodone-APAP 5-325 (1.5 tablets) with some relief. She has tried using heat with no relief.    ROS Reports night time disturbances. Denies fevers, chills, or night sweats. Denies unexplained weight loss. Denies personal history of cancer. Denies changes in bowel or bladder habits. Denies recent unreported falls. Denies new or worsening dyspnea or wheezing. Reports headaches or dizziness, since starting Myrbetriq.  Denies numbness, tingling or weakness  In the extremities.  Denies dizziness or presyncopal episodes Denies lower extremity edema     HISTORY & PERTINENT PRIOR DATA:  Prior History reviewed and updated per electronic medical record.  Significant history, findings, studies and interim changes include:  reports that she quit smoking about 27 years ago. Her smoking use included cigarettes. She has a 30.00 pack-year smoking history. she has never used smokeless tobacco. Recent Labs    04/23/17 0854  HGBA1C 5.3   No specialty  comments available. Problem  Low Back Pain    OBJECTIVE:  VS:  HT:5\' 8"  (172.7 cm)   WT:190 lb 9.6 oz (86.5 kg)  BMI:28.99    BP:110/80  HR:(!) 58bpm  TEMP: ( )  RESP:97 %   PHYSICAL EXAM: Constitutional: WDWN, Non-toxic appearing. Psychiatric: Alert & appropriately interactive.  Not depressed or anxious appearing. Respiratory: No increased work of breathing.  Trachea Midline Eyes: Pupils are equal.  EOM intact without nystagmus.  No scleral icterus  NEUROVASCULAR exam: No clubbing or cyanosis appreciated No significant venous stasis changes Capillary Refill: normal, less than 2 seconds   Back & Lower Extremities:  Generalized tightness straight leg raise bilaterally.    No significant midline tenderness.    Bilateral paraspinal muscle spasms.  Good internal and external rotation of the hips.  Patient is able to heel and toe walk without significant difficulty.  Manual muscle testing is 5+/5 in BLE myotomes without focality  Lower extremity DTRs 2+/4 diffusely and symmetric   No additional findings.   ASSESSMENT & PLAN:   1. Chronic low back pain without sciatica, unspecified back pain laterality    PLAN:>50% of this 30 minute visit spent in direct patient counseling and/or coordination of care.  Discussion was focused on education regarding the in discussing the pathoetiology and anticipated clinical course of the above condition. Low back pain Functional low back pain.  No significant red flag symptoms.  Discussed multiple options including referral to physical therapy We will start Medrol Dosepak as well as a muscle relaxer and follow-up in 4 weeks to ensure clinical resolution   ++++++++++++++++++++++++++++++++++++++++++++ Orders & Meds:  Orders Placed This Encounter  Procedures  . Ambulatory referral to Physical Therapy    Meds ordered this encounter  Medications  . methylPREDNISolone (MEDROL DOSEPAK) 4 MG TBPK tablet    Sig: Take by mouth as directed.  Take 6 tablets on the first day prescribed then as directed.    Dispense:  21 tablet    Refill:  0  . cyclobenzaprine (FLEXERIL) 10 MG tablet    Sig: Take 1 tablet (10 mg total) by mouth 3 (three) times daily as needed for muscle spasms.    Dispense:  30 tablet    Refill:  1    ++++++++++++++++++++++++++++++++++++++++++++ Follow-up: Return in about 6 weeks (around 06/23/2017).   Pertinent documentation may be included in additional procedure notes, imaging studies, problem based documentation and patient instructions. Please see these sections of the encounter for additional information regarding this visit. CMA/ATC served as Education administrator during this visit. History, Physical, and Plan performed by medical provider. Documentation and orders reviewed and attested to.      Gerda Diss, [facility] Physician

## 2017-05-13 DIAGNOSIS — M62838 Other muscle spasm: Secondary | ICD-10-CM | POA: Diagnosis not present

## 2017-05-13 DIAGNOSIS — N3281 Overactive bladder: Secondary | ICD-10-CM | POA: Diagnosis not present

## 2017-05-13 DIAGNOSIS — M6281 Muscle weakness (generalized): Secondary | ICD-10-CM | POA: Diagnosis not present

## 2017-05-13 DIAGNOSIS — F411 Generalized anxiety disorder: Secondary | ICD-10-CM | POA: Diagnosis not present

## 2017-05-14 ENCOUNTER — Encounter: Payer: Self-pay | Admitting: Physical Therapy

## 2017-05-14 ENCOUNTER — Ambulatory Visit: Payer: BLUE CROSS/BLUE SHIELD | Admitting: Physical Therapy

## 2017-05-14 DIAGNOSIS — M545 Low back pain, unspecified: Secondary | ICD-10-CM

## 2017-05-14 NOTE — Therapy (Signed)
[facility] [address] [place], [place], [address] Phone: [phone]   Fax:  [phone]  Physical Therapy Evaluation  Patient Details  Name: Chelsey Jackson MRN: [id number] Date of Birth: 07-29-1950 Referring Provider: Paulla Fore   Encounter Date: 05/14/2017  PT End of Session - 05/14/17 9758    Visit Number  1    Number of Visits  12    Date for PT Re-Evaluation  06/25/17    PT Start Time  1430    PT Stop Time  1518    PT Time Calculation (min)  48 min    Activity Tolerance  Patient tolerated treatment well;Patient limited by pain       Past Medical History:  Diagnosis Date  . 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 Allergy    weekly allergy shots  . Anxiety   . Asthma   . Depression   . Fibrocystic breast disease   . GERD (gastroesophageal reflux disease)   . OSA (obstructive sleep apnea) 07/31/2013  . Peptic ulcer disease   . TMJ locking     Past Surgical History:  Procedure Laterality Date  . ABDOMINAL HYSTERECTOMY  2003   due to fibroids  . ADENOIDECTOMY     x3  . COLONOSCOPY  03/10/2007   results-normal  . HAMMER TOE SURGERY    . PUBOVAGINAL SLING  '90's   for incontinence  . RHINOPLASTY  1984  . ROTATOR CUFF REPAIR Right 2013   [place]  . ROTATOR CUFF REPAIR Left 2012   [place]  . SHOULDER SURGERY    . TONSILLECTOMY     x2  . TUBAL LIGATION    . [place]  . WRIST SURGERY      There were no vitals filed for this visit.   Subjective Assessment - 05/14/17 1429    Subjective  Pt states increased pain in low back, increased this past weekend with lifting grandchild. Pt works as a Engineer, manufacturing systems. She also had pain in September of same nature , which resolved. She states difficulty finding comfortable position and pain with standing and transfers. She has not had previous or chronic back pain in past.     Limitations   Sitting;Lifting;Standing;Walking;House hold activities    Patient Stated Goals  Decreased pain, improved mobility     Currently in Pain?  Yes    Pain Score  4     Pain Location  Back    Pain Orientation  Left    Pain Onset  In the past 7 days    Pain Frequency  Intermittent    Aggravating Factors   Transfers, standing, working.          [facility] PT Assessment - 05/14/17 0001      Assessment   Medical Diagnosis  Low Back Pain    Referring Provider  Paulla Fore    Prior Therapy  No      Precautions   Precautions  None      Balance Screen   Has the patient fallen in the past 6 months  No      Prior Function   Level of Independence  Independent      Cognition   Overall Cognitive Status  Within Functional Limits for tasks assessed      ROM / Strength   AROM / PROM / Strength  AROM;Strength      AROM   Overall AROM Comments  hips/knees: WNL    AROM Assessment Site  Lumbar    Lumbar Flexion  Mild limitation    Lumbar Extension  Mild limitation/pain    Lumbar - Right Side Bend  mild limitation    Lumbar - Left Side Bend  mild limitation/pain      Strength   Overall Strength Comments  Hips: 4/5, Core: 3/5      Palpation   Palpation comment  Painful L SI with spring, mild soreness in Lumbar paraspinal, minimal pain in glute or piriformis.  Probably L posterior rotation of pelvis: short to long test + on L.       Transfers   Transfers  -- Painful Transfers              Objective measurements completed on examination: See above findings.      [facility] Adult PT Treatment/Exercise - 05/14/17 0001      Exercises   Exercises  Lumbar;Knee/Hip      Lumbar Exercises: Stretches   Active Hamstring Stretch  2 reps;30 seconds    Active Hamstring Stretch Limitations  seated    Single Knee to Chest Stretch  3 reps;30 seconds    Figure 4 Stretch  3 reps;30 seconds    Other Lumbar Stretch Exercise  Self MET to increase L anterior rotation 5 sec x5       Modalities   Modalities   Moist Heat;Electrical Stimulation      Moist Heat Therapy   Number Minutes Moist Heat  10 Minutes    Moist Heat Location  Lumbar Spine      Electrical Stimulation   Electrical Stimulation Location  L Lumbar    Electrical Stimulation Parameters  PreMod x10 min      Manual Therapy   Manual Therapy  Muscle Energy Technique;Joint mobilization    Joint Mobilization  anterior rotation of pelvis, in R s/l     Muscle Energy Technique  To increase L anterior rotation             PT Education - 05/14/17 1632    Education provided  Yes    Education Details  HEP    Person(s) Educated  Patient    Methods  Explanation;Demonstration;Verbal cues;Handout    Comprehension  Verbalized understanding;Verbal cues required       PT Short Term Goals - 05/14/17 1637      PT SHORT TERM GOAL #1   Title  Pt to be independent with initial HEP     Time  2    Period  Weeks    Status  New    Target Date  05/28/17      PT SHORT TERM GOAL #2   Title  Pt to report decreased pain to 3/10 with transfers and activity     Time  2    Period  Weeks    Status  New    Target Date  05/28/17        PT Long Term Goals - 05/14/17 1638      PT LONG TERM GOAL #1   Title  Pt to report decreased pain to 0-2/10 with transfers, and standing activity     Time  6    Period  Weeks    Status  New    Target Date  06/25/17      PT LONG TERM GOAL #2   Title  Pt to demo improved lumbar ROM to be WNL and pain free, to improve ability for IADLs  Time  6    Period  Weeks    Status  New    Target Date  06/25/17      PT LONG TERM GOAL #3   Title  Pt to demo proper body mechanics with lifting and squatting , to decrease risk for re-injury.     Time  6    Period  Weeks    Status  New    Target Date  06/25/17             Plan - 05/14/17 1636  Clinical Impression:  Pt presents with primary complaint of increased pain in L low lumbar and SI region. She has significant soreness to palpate L SI, and  mild tightness into surrounding lumbar musculature. She has noted L posterior pelvic rotation. She has much difficulty with transfers and bending due to pain. Pt also with difficulty with ambulation and work duties due to pain. Pt with deficits that are effecting ability to perform ADLs, IADLs, and functional activity. Pto benefit from skilled physical therapy to address above deficits and return to PLOF without pain. Prognosis for recovery is good. Home exercise program issued today.     Clinical Presentation  Evolving    Clinical Decision Making  Low    Rehab Potential  Good    PT Frequency  2x / week    PT Duration  6 weeks    PT Treatment/Interventions  ADLs/Self Care Home Management;Cryotherapy;Electrical Stimulation;Moist Heat;Therapeutic exercise;Therapeutic activities;Functional mobility training;Stair training;Gait training;Ultrasound;Balance training;Neuromuscular re-education;Patient/family education;Orthotic Fit/Training;Manual techniques;Taping;Dry needling;Passive range of motion       Patient will benefit from skilled therapeutic intervention in order to improve the following deficits and impairments:  Abnormal gait, Decreased endurance, Decreased strength, Pain, Decreased mobility, Difficulty walking, Decreased range of motion, Impaired flexibility  Visit Diagnosis: Acute left-sided low back pain without sciatica - Plan: PT plan of care cert/re-cert     Problem List Patient Active Problem List   Diagnosis Date Noted  . History of retinal tear 10/20/2016  . Low back pain 09/28/2015  . Insomnia 08/18/2013  . OSA (obstructive sleep apnea) 07/31/2013  . Thyroid nodule, cold 07/15/2012  . Anemia 01/10/2010  . Anxiety and depression 10/05/2007  . Allergic rhinitis due to pollen 10/05/2007  . Asthma 10/05/2007   Lyndee Hensen, PT, DPT 4:47 PM  05/14/17    [facility] [place], [place], [address] Phone:  [phone]   Fax:  [phone]  Name: Chelsey Jackson MRN: [id number] Date of Birth: 08/01/1950

## 2017-05-18 DIAGNOSIS — J301 Allergic rhinitis due to pollen: Secondary | ICD-10-CM | POA: Diagnosis not present

## 2017-05-18 DIAGNOSIS — J3089 Other allergic rhinitis: Secondary | ICD-10-CM | POA: Diagnosis not present

## 2017-05-20 ENCOUNTER — Encounter: Payer: Self-pay | Admitting: Physical Therapy

## 2017-05-20 ENCOUNTER — Ambulatory Visit: Payer: BLUE CROSS/BLUE SHIELD | Admitting: Physical Therapy

## 2017-05-20 DIAGNOSIS — M545 Low back pain, unspecified: Secondary | ICD-10-CM

## 2017-05-20 NOTE — Therapy (Signed)
[facility] [address] [place], [place], [address] Phone: [phone]   Fax:  [phone]  Physical Therapy Treatment  Patient Details  Name: Chelsey Jackson MRN: [id number] Date of Birth: [birth year] Referring Provider: Paulla Fore   Encounter Date: 05/20/2017  PT End of Session - 05/20/17 1652    Visit Number  2    Number of Visits  12    Date for PT Re-Evaluation  06/25/17    PT Start Time  1600    PT Stop Time  1650    PT Time Calculation (min)  50 min    Activity Tolerance  Patient tolerated treatment well;Patient limited by pain       Past Medical History:  Diagnosis Date  . 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 Allergy    weekly allergy shots  . Anxiety   . Asthma   . Depression   . Fibrocystic breast disease   . GERD (gastroesophageal reflux disease)   . OSA (obstructive sleep apnea) 07/31/2013  . Peptic ulcer disease   . TMJ locking     Past Surgical History:  Procedure Laterality Date  . ABDOMINAL HYSTERECTOMY  2003   due to fibroids  . ADENOIDECTOMY     x3  . COLONOSCOPY  03/10/2007   results-normal  . HAMMER TOE SURGERY    . PUBOVAGINAL SLING  '90's   for incontinence  . RHINOPLASTY  1984  . ROTATOR CUFF REPAIR Right 2013   [place]  . ROTATOR CUFF REPAIR Left 2012   [place]  . SHOULDER SURGERY    . TONSILLECTOMY     x2  . TUBAL LIGATION    . [place]  . WRIST SURGERY      There were no vitals filed for this visit.  Subjective Assessment - 05/20/17 1648    Subjective  Pt with improved pain from earlier this week. Has been doing HEP.     Currently in Pain?  Yes    Pain Score  2     Pain Location  Back    Pain Orientation  Left;Lower    Pain Descriptors / Indicators  Sharp;Aching    Pain Type  Acute pain    Pain Onset  1 to 4 weeks ago    Pain Frequency  Intermittent                      [facility]  Adult PT Treatment/Exercise - 05/20/17 1603      Transfers   Transfers  -- Painful Transfers       Exercises   Exercises  Lumbar;Knee/Hip      Lumbar Exercises: Stretches   Active Hamstring Stretch  2 reps;30 seconds    Active Hamstring Stretch Limitations  seated    Single Knee to Chest Stretch  3 reps;30 seconds    Lower Trunk Rotation  -- x10    Figure 4 Stretch  3 reps;30 seconds      Lumbar Exercises: Supine   Bridge  10 reps    Other Supine Lumbar Exercises  TA with hip ER, alternating x20      Knee/Hip Exercises: Standing   Hip Abduction  20 reps;Both      Knee/Hip Exercises: Supine   Hip Adduction Isometric  20 reps    Hip Adduction Isometric Limitations  Ball squeeze    Other Supine Knee/Hip Exercises  Clam Shell with  GTB x20      Modalities   Modalities  Moist Heat;Electrical Stimulation      Moist Heat Therapy   Number Minutes Moist Heat  10 Minutes    Moist Heat Location  Lumbar Spine      Electrical Stimulation   Electrical Stimulation Location  L Lumbar    Electrical Stimulation Parameters  Premod x 10 min      Manual Therapy   Manual Therapy  Soft tissue mobilization    Joint Mobilization  SI mobs gr 3    Soft tissue mobilization  STM to L SI and glute musculature     Muscle Energy Technique  --             PT Education - 05/20/17 1652    Education provided  Yes    Education Details  mechanics for HEP, activity modifications     Person(s) Educated  Patient    Methods  Explanation    Comprehension  Verbalized understanding;Verbal cues required       PT Short Term Goals - 05/14/17 1637      PT SHORT TERM GOAL #1   Title  Pt to be independent with initial HEP     Time  2    Period  Weeks    Status  New    Target Date  05/28/17      PT SHORT TERM GOAL #2   Title  Pt to report decreased pain to 3/10 with transfers and activity     Time  2    Period  Weeks    Status  New    Target Date  05/28/17        PT Long Term Goals -  05/14/17 1638      PT LONG TERM GOAL #1   Title  Pt to report decreased pain to 0-2/10 with transfers, and standing activity     Time  6    Period  Weeks    Status  New    Target Date  06/25/17      PT LONG TERM GOAL #2   Title  Pt to demo improved lumbar ROM to be WNL and pain free, to improve ability for IADLs     Time  6    Period  Weeks    Status  New    Target Date  06/25/17      PT LONG TERM GOAL #3   Title  Pt to demo proper body mechanics with lifting and squatting , to decrease risk for re-injury.     Time  6    Period  Weeks    Status  New    Target Date  06/25/17            Plan - 05/20/17 1652    Clinical Impression Statement Pt with tenderness at L SI with STM today, but much decreased since early this week. Pt able to perform light strengthening and stabilization today, without increased pain. E-stim continued for pain today. Plan to progress as tolerated.    Rehab Potential  Good    PT Frequency  2x / week    PT Duration  6 weeks    PT Treatment/Interventions  ADLs/Self Care Home Management;Cryotherapy;Electrical Stimulation;Moist Heat;Therapeutic exercise;Therapeutic activities;Functional mobility training;Stair training;Gait training;Ultrasound;Balance training;Neuromuscular re-education;Patient/family education;Orthotic Fit/Training;Manual techniques;Taping;Dry needling;Passive range of motion       Patient will benefit from skilled therapeutic intervention in order to improve the following deficits and impairments:  Abnormal gait, Decreased  endurance, Decreased strength, Pain, Decreased mobility, Difficulty walking, Decreased range of motion, Impaired flexibility  Visit Diagnosis: Acute left-sided low back pain without sciatica     Problem List Patient Active Problem List   Diagnosis Date Noted  . History of retinal tear 10/20/2016  . Low back pain 09/28/2015  . Insomnia 08/18/2013  . OSA (obstructive sleep apnea) 07/31/2013  . Thyroid nodule,  cold 07/15/2012  . Anemia 01/10/2010  . Anxiety and depression 10/05/2007  . Allergic rhinitis due to pollen 10/05/2007  . Asthma 10/05/2007   Lyndee Hensen, PT, DPT 5:00 PM  05/20/17    [facility] [place], [place], [address] Phone: [phone]   Fax:  [phone]  Name: Chelsey Jackson MRN: [id number] Date of Birth: May 31, 1950

## 2017-05-25 DIAGNOSIS — J301 Allergic rhinitis due to pollen: Secondary | ICD-10-CM | POA: Diagnosis not present

## 2017-05-25 DIAGNOSIS — J3089 Other allergic rhinitis: Secondary | ICD-10-CM | POA: Diagnosis not present

## 2017-05-26 ENCOUNTER — Ambulatory Visit: Payer: BLUE CROSS/BLUE SHIELD | Admitting: Physical Therapy

## 2017-05-26 ENCOUNTER — Encounter: Payer: Self-pay | Admitting: Physical Therapy

## 2017-05-26 DIAGNOSIS — M545 Low back pain, unspecified: Secondary | ICD-10-CM

## 2017-05-26 NOTE — Therapy (Signed)
[facility] [address] [place], [place], [address] Phone: [phone]   Fax:  [phone]  Physical Therapy Treatment  Patient Details  Name: Chelsey Jackson MRN: [id number] Date of Birth: 1950/05/21 Referring Provider: Paulla Fore   Encounter Date: 05/26/2017  PT End of Session - 05/26/17 1252    Visit Number  3    Number of Visits  12    Date for PT Re-Evaluation  06/25/17    PT Start Time  8756    PT Stop Time  1135    PT Time Calculation (min)  55 min    Activity Tolerance  Patient tolerated treatment well;Patient limited by pain       Past Medical History:  Diagnosis Date  . 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 Allergy    weekly allergy shots  . Anxiety   . Asthma   . Depression   . Fibrocystic breast disease   . GERD (gastroesophageal reflux disease)   . OSA (obstructive sleep apnea) 07/31/2013  . Peptic ulcer disease   . TMJ locking     Past Surgical History:  Procedure Laterality Date  . ABDOMINAL HYSTERECTOMY  2003   due to fibroids  . ADENOIDECTOMY     x3  . COLONOSCOPY  03/10/2007   results-normal  . HAMMER TOE SURGERY    . PUBOVAGINAL SLING  '90's   for incontinence  . RHINOPLASTY  1984  . ROTATOR CUFF REPAIR Right 2013   [place]  . ROTATOR CUFF REPAIR Left 2012   [place]  . SHOULDER SURGERY    . TONSILLECTOMY     x2  . TUBAL LIGATION    . [place]  . WRIST SURGERY      There were no vitals filed for this visit.  Subjective Assessment - 05/26/17 1251    Subjective  Pt states minimal/no pain over the weekend. Has been doing HEP     Pain Score  1     Pain Location  Back    Pain Orientation  Left;Lower    Pain Descriptors / Indicators  Aching;Tightness    Pain Type  Acute pain    Pain Onset  1 to 4 weeks ago    Pain Frequency  Intermittent                      [facility] Adult PT Treatment/Exercise -  05/26/17 1042      Transfers   Transfers  --      Exercises   Exercises  Lumbar;Knee/Hip      Lumbar Exercises: Stretches   Active Hamstring Stretch  --    Active Hamstring Stretch Limitations  --    Single Knee to Chest Stretch  --    Lower Trunk Rotation  -- x10    Piriformis Stretch  3 reps;30 seconds    Piriformis Stretch Limitations  Seated    Figure 4 Stretch  --    Other Lumbar Stretch Exercise  Standing QL stretch 30 sec x2 bil      Lumbar Exercises: Aerobic   Stationary Bike  L1 x 8 min      Lumbar Exercises: Supine   Clam  20 reps    Clam Limitations  GTB alternating    Bridge  20 reps    Straight Leg Raise  20 reps    Other Supine Lumbar Exercises  --  Knee/Hip Exercises: Standing   Hip Abduction  20 reps;Both    Hip Extension  20 reps;Both      Knee/Hip Exercises: Supine   Hip Adduction Isometric  20 reps    Hip Adduction Isometric Limitations  Ball squeeze    Other Supine Knee/Hip Exercises  --      Knee/Hip Exercises: Sidelying   Hip ABduction  2 sets;10 reps;Both      Modalities   Modalities  Moist Heat;Electrical Stimulation      Moist Heat Therapy   Number Minutes Moist Heat  12 Minutes    Moist Heat Location  Lumbar Spine      Electrical Stimulation   Electrical Stimulation Location  L Lumbar    Electrical Stimulation Parameters  PreMod x12 min      Manual Therapy   Manual Therapy  Soft tissue mobilization    Joint Mobilization  SI mobs gr 3    Soft tissue mobilization  STM to L SI and glute musculature              PT Education - 05/26/17 1251    Education provided  Yes    Education Details  HEP    Person(s) Educated  Patient    Methods  Explanation    Comprehension  Verbalized understanding       PT Short Term Goals - 05/14/17 1637      PT SHORT TERM GOAL #1   Title  Pt to be independent with initial HEP     Time  2    Period  Weeks    Status  New    Target Date  05/28/17      PT SHORT TERM GOAL #2   Title  Pt  to report decreased pain to 3/10 with transfers and activity     Time  2    Period  Weeks    Status  New    Target Date  05/28/17        PT Long Term Goals - 05/14/17 1638      PT LONG TERM GOAL #1   Title  Pt to report decreased pain to 0-2/10 with transfers, and standing activity     Time  6    Period  Weeks    Status  New    Target Date  06/25/17      PT LONG TERM GOAL #2   Title  Pt to demo improved lumbar ROM to be WNL and pain free, to improve ability for IADLs     Time  6    Period  Weeks    Status  New    Target Date  06/25/17      PT LONG TERM GOAL #3   Title  Pt to demo proper body mechanics with lifting and squatting , to decrease risk for re-injury.     Time  6    Period  Weeks    Status  New    Target Date  06/25/17            Plan - 05/26/17 1253    Clinical Impression Statement  Pt with improving pain and improving ability for Hip and lumbar movements. She has no increased pain with exercises today, and was able to progress light strengthening. She does have tenderness to palpate glute med and R SI. STM for pain and muscle tightness, and e-stim continued for pain.     Rehab Potential  Good    PT Frequency  2x / week    PT Duration  6 weeks    PT Treatment/Interventions  ADLs/Self Care Home Management;Cryotherapy;Electrical Stimulation;Moist Heat;Therapeutic exercise;Therapeutic activities;Functional mobility training;Stair training;Gait training;Ultrasound;Balance training;Neuromuscular re-education;Patient/family education;Orthotic Fit/Training;Manual techniques;Taping;Dry needling;Passive range of motion       Patient will benefit from skilled therapeutic intervention in order to improve the following deficits and impairments:  Abnormal gait, Decreased endurance, Decreased strength, Pain, Decreased mobility, Difficulty walking, Decreased range of motion, Impaired flexibility  Visit Diagnosis: Acute left-sided low back pain without  sciatica     Problem List Patient Active Problem List   Diagnosis Date Noted  . History of retinal tear 10/20/2016  . Low back pain 09/28/2015  . Insomnia 08/18/2013  . OSA (obstructive sleep apnea) 07/31/2013  . Thyroid nodule, cold 07/15/2012  . Anemia 01/10/2010  . Anxiety and depression 10/05/2007  . Allergic rhinitis due to pollen 10/05/2007  . Asthma 10/05/2007   Lyndee Hensen, PT, DPT 12:55 PM  05/26/17    [facility] [place], [place], [address] Phone: [phone]   Fax:  [phone]  Name: ROYALTI SCHAUF MRN: [id number] Date of Birth: [birth year]

## 2017-05-28 DIAGNOSIS — M6281 Muscle weakness (generalized): Secondary | ICD-10-CM | POA: Diagnosis not present

## 2017-05-28 DIAGNOSIS — M62838 Other muscle spasm: Secondary | ICD-10-CM | POA: Diagnosis not present

## 2017-05-28 DIAGNOSIS — N3281 Overactive bladder: Secondary | ICD-10-CM | POA: Diagnosis not present

## 2017-05-28 DIAGNOSIS — J301 Allergic rhinitis due to pollen: Secondary | ICD-10-CM | POA: Diagnosis not present

## 2017-05-28 DIAGNOSIS — J3081 Allergic rhinitis due to animal (cat) (dog) hair and dander: Secondary | ICD-10-CM | POA: Diagnosis not present

## 2017-06-01 ENCOUNTER — Encounter: Payer: Self-pay | Admitting: Physical Therapy

## 2017-06-01 ENCOUNTER — Ambulatory Visit: Payer: BLUE CROSS/BLUE SHIELD | Admitting: Physical Therapy

## 2017-06-01 DIAGNOSIS — M545 Low back pain, unspecified: Secondary | ICD-10-CM

## 2017-06-01 DIAGNOSIS — J301 Allergic rhinitis due to pollen: Secondary | ICD-10-CM | POA: Diagnosis not present

## 2017-06-01 NOTE — Therapy (Addendum)
[facility] [address] [place], [place], [address] Phone: [phone]   Fax:  [phone]  Physical Therapy Treatment  Patient Details  Name: Chelsey Jackson MRN: [id number] Date of Birth: 1951-03-05 Referring Provider: Paulla Fore   Encounter Date: 06/01/2017  PT End of Session - 06/01/17 1325    Visit Number  4    Number of Visits  12    Date for PT Re-Evaluation  06/25/17    PT Start Time  1307    PT Stop Time  7824    PT Time Calculation (min)  40 min    Activity Tolerance  Patient tolerated treatment well;Patient limited by pain       Past Medical History:  Diagnosis Date  . 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 Allergy    weekly allergy shots  . Anxiety   . Asthma   . Depression   . Fibrocystic breast disease   . GERD (gastroesophageal reflux disease)   . OSA (obstructive sleep apnea) 07/31/2013  . Peptic ulcer disease   . TMJ locking     Past Surgical History:  Procedure Laterality Date  . ABDOMINAL HYSTERECTOMY  2003   due to fibroids  . ADENOIDECTOMY     x3  . COLONOSCOPY  03/10/2007   results-normal  . HAMMER TOE SURGERY    . PUBOVAGINAL SLING  '90's   for incontinence  . RHINOPLASTY  1984  . ROTATOR CUFF REPAIR Right 2013   [place]  . ROTATOR CUFF REPAIR Left 2012   [facility]  . SHOULDER SURGERY    . TONSILLECTOMY     x2  . TUBAL LIGATION    . [place]  . WRIST SURGERY      There were no vitals filed for this visit.  Subjective Assessment - 06/01/17 1406    Subjective  Pt has had no pain . She reports doing all regular activities at home without pain, and is doing HEP.     Currently in Pain?  No/denies    Pain Score  0-No pain         [facility] PT Assessment - 06/01/17 0001      AROM   Lumbar Flexion  wfl    Lumbar Extension  wfl    Lumbar - Right Side Bend  wfl    Lumbar - Left Side Bend  wfl      Strength   Overall Strength Comments  hips: 4+/5 ,       Palpation   Palpation comment  no pain      Transfers   Transfers  -- WNL, no pain                  [facility] Adult PT Treatment/Exercise - 06/01/17 1316      Exercises   Exercises  Lumbar;Knee/Hip      Lumbar Exercises: Stretches   Lower Trunk Rotation  -- x10    Piriformis Stretch  --    Piriformis Stretch Limitations  --    Figure 4 Stretch  3 reps;30 seconds      Lumbar Exercises: Aerobic   Stationary Bike  L1 x 8 min      Lumbar Exercises: Supine   Clam  20 reps    Clam Limitations  GTB alternating    Bridge  20 reps    Straight Leg Raise  --    Other Supine Lumbar  Exercises  Modified crunch x20      Knee/Hip Exercises: Standing   Hip Flexion  20 reps    Hip Flexion Limitations  marching     Hip Abduction  20 reps;Both    Hip Extension  20 reps;Both      Knee/Hip Exercises: Supine   Hip Adduction Isometric  --    Hip Adduction Isometric Limitations  --      Knee/Hip Exercises: Sidelying   Hip ABduction  2 sets;10 reps;Both      Modalities   Modalities  Moist Heat;Electrical Stimulation      Moist Heat Therapy   Moist Heat Location  Lumbar Spine      Electrical Stimulation   Electrical Stimulation Location  L Lumbar      Manual Therapy   Manual Therapy  Soft tissue mobilization    Joint Mobilization  SI mobs gr 3    Soft tissue mobilization  STM to L SI and glute musculature              PT Education - 06/01/17 1406    Education provided  Yes    Education Details  HEP, D/C plan    Person(s) Educated  Patient    Methods  Explanation    Comprehension  Verbalized understanding       PT Short Term Goals - 06/01/17 1407      PT SHORT TERM GOAL #1   Title  Pt to be independent with initial HEP     Status  Achieved      PT SHORT TERM GOAL #2   Title  Pt to report decreased pain to 3/10 with transfers and activity     Status  Achieved        PT Long Term Goals - 06/01/17 1407       PT LONG TERM GOAL #1   Title  Pt to report decreased pain to 0-2/10 with transfers, and standing activity     Status  Achieved      PT LONG TERM GOAL #2   Title  Pt to demo improved lumbar ROM to be WNL and pain free, to improve ability for IADLs     Status  Achieved      PT LONG TERM GOAL #3   Title  Pt to demo proper body mechanics with lifting and squatting , to decrease risk for re-injury.     Status  Achieved            Plan - 06/01/17 1407    Clinical Impression Statement  Pt has made good improvements in PT. She has no pain with exercises or with daily activities. She has good understanding of HEP , which was reviewed today. Education also done for exercise progression, and body mechanics for transfers, and lifting grandchild. Pt going out of town, will place on hold for 3 weeks, plan to d/c if pt does not return with increased symptoms. Goals met at this time.     Rehab Potential  Good    PT Frequency  2x / week    PT Duration  6 weeks    PT Treatment/Interventions  ADLs/Self Care Home Management;Cryotherapy;Electrical Stimulation;Moist Heat;Therapeutic exercise;Therapeutic activities;Functional mobility training;Stair training;Gait training;Ultrasound;Balance training;Neuromuscular re-education;Patient/family education;Orthotic Fit/Training;Manual techniques;Taping;Dry needling;Passive range of motion       Patient will benefit from skilled therapeutic intervention in order to improve the following deficits and impairments:  Abnormal gait, Decreased endurance, Decreased strength, Pain, Decreased mobility, Difficulty walking, Decreased range of motion,  Impaired flexibility  Visit Diagnosis: Acute left-sided low back pain without sciatica     Problem List Patient Active Problem List   Diagnosis Date Noted  . History of retinal tear 10/20/2016  . Low back pain 09/28/2015  . Insomnia 08/18/2013  . OSA (obstructive sleep apnea) 07/31/2013  . Thyroid nodule, cold  07/15/2012  . Anemia 01/10/2010  . Anxiety and depression 10/05/2007  . Allergic rhinitis due to pollen 10/05/2007  . Asthma 10/05/2007    Lyndee Hensen, PT, DPT 2:14 PM  06/01/17    [facility] [place], [place], [address] Phone: [phone]   Fax:  [phone]  Name: [name] MRN: [id number] Date of Birth: 1950-09-08   PHYSICAL THERAPY DISCHARGE SUMMARY  Visits from Start of Care: 4 Pt did not return after being put on hold. Goals met.    Plan: Patient agrees to discharge.  Patient goals were met. Patient is being discharged due to meeting the stated rehab goals.  ?????    Lyndee Hensen, PT, DPT 3:46 PM  06/29/17

## 2017-06-02 ENCOUNTER — Encounter: Payer: Self-pay | Admitting: Sports Medicine

## 2017-06-02 NOTE — Assessment & Plan Note (Signed)
Functional low back pain.  No significant red flag symptoms.  Discussed multiple options including referral to physical therapy We will start Medrol Dosepak as well as a muscle relaxer and follow-up in 4 weeks to ensure clinical resolution

## 2017-06-09 DIAGNOSIS — J3089 Other allergic rhinitis: Secondary | ICD-10-CM | POA: Diagnosis not present

## 2017-06-09 DIAGNOSIS — J301 Allergic rhinitis due to pollen: Secondary | ICD-10-CM | POA: Diagnosis not present

## 2017-06-11 ENCOUNTER — Encounter: Payer: Self-pay | Admitting: Family Medicine

## 2017-06-11 DIAGNOSIS — J301 Allergic rhinitis due to pollen: Secondary | ICD-10-CM | POA: Diagnosis not present

## 2017-06-11 DIAGNOSIS — J3089 Other allergic rhinitis: Secondary | ICD-10-CM | POA: Diagnosis not present

## 2017-06-15 DIAGNOSIS — N3281 Overactive bladder: Secondary | ICD-10-CM | POA: Diagnosis not present

## 2017-06-15 DIAGNOSIS — J301 Allergic rhinitis due to pollen: Secondary | ICD-10-CM | POA: Diagnosis not present

## 2017-06-15 DIAGNOSIS — J3089 Other allergic rhinitis: Secondary | ICD-10-CM | POA: Diagnosis not present

## 2017-06-16 DIAGNOSIS — F411 Generalized anxiety disorder: Secondary | ICD-10-CM | POA: Diagnosis not present

## 2017-06-17 DIAGNOSIS — J301 Allergic rhinitis due to pollen: Secondary | ICD-10-CM | POA: Diagnosis not present

## 2017-06-17 DIAGNOSIS — J3089 Other allergic rhinitis: Secondary | ICD-10-CM | POA: Diagnosis not present

## 2017-06-23 ENCOUNTER — Ambulatory Visit: Payer: BLUE CROSS/BLUE SHIELD | Admitting: Sports Medicine

## 2017-06-23 DIAGNOSIS — J3089 Other allergic rhinitis: Secondary | ICD-10-CM | POA: Diagnosis not present

## 2017-06-23 DIAGNOSIS — J301 Allergic rhinitis due to pollen: Secondary | ICD-10-CM | POA: Diagnosis not present

## 2017-06-29 DIAGNOSIS — J3089 Other allergic rhinitis: Secondary | ICD-10-CM | POA: Diagnosis not present

## 2017-06-29 DIAGNOSIS — J301 Allergic rhinitis due to pollen: Secondary | ICD-10-CM | POA: Diagnosis not present

## 2017-07-13 DIAGNOSIS — J3089 Other allergic rhinitis: Secondary | ICD-10-CM | POA: Diagnosis not present

## 2017-07-13 DIAGNOSIS — J301 Allergic rhinitis due to pollen: Secondary | ICD-10-CM | POA: Diagnosis not present

## 2017-07-20 DIAGNOSIS — J3089 Other allergic rhinitis: Secondary | ICD-10-CM | POA: Diagnosis not present

## 2017-07-20 DIAGNOSIS — J301 Allergic rhinitis due to pollen: Secondary | ICD-10-CM | POA: Diagnosis not present

## 2017-07-21 DIAGNOSIS — F411 Generalized anxiety disorder: Secondary | ICD-10-CM | POA: Diagnosis not present

## 2017-07-27 DIAGNOSIS — J301 Allergic rhinitis due to pollen: Secondary | ICD-10-CM | POA: Diagnosis not present

## 2017-07-27 DIAGNOSIS — J3089 Other allergic rhinitis: Secondary | ICD-10-CM | POA: Diagnosis not present

## 2017-08-03 DIAGNOSIS — J3089 Other allergic rhinitis: Secondary | ICD-10-CM | POA: Diagnosis not present

## 2017-08-03 DIAGNOSIS — J301 Allergic rhinitis due to pollen: Secondary | ICD-10-CM | POA: Diagnosis not present

## 2017-08-05 DIAGNOSIS — L818 Other specified disorders of pigmentation: Secondary | ICD-10-CM | POA: Diagnosis not present

## 2017-08-05 DIAGNOSIS — L84 Corns and callosities: Secondary | ICD-10-CM | POA: Diagnosis not present

## 2017-08-06 DIAGNOSIS — F332 Major depressive disorder, recurrent severe without psychotic features: Secondary | ICD-10-CM | POA: Diagnosis not present

## 2017-08-10 DIAGNOSIS — J301 Allergic rhinitis due to pollen: Secondary | ICD-10-CM | POA: Diagnosis not present

## 2017-08-10 DIAGNOSIS — J3089 Other allergic rhinitis: Secondary | ICD-10-CM | POA: Diagnosis not present

## 2017-08-17 DIAGNOSIS — J3089 Other allergic rhinitis: Secondary | ICD-10-CM | POA: Diagnosis not present

## 2017-08-17 DIAGNOSIS — J301 Allergic rhinitis due to pollen: Secondary | ICD-10-CM | POA: Diagnosis not present

## 2017-08-18 DIAGNOSIS — H26492 Other secondary cataract, left eye: Secondary | ICD-10-CM | POA: Diagnosis not present

## 2017-08-19 DIAGNOSIS — F411 Generalized anxiety disorder: Secondary | ICD-10-CM | POA: Diagnosis not present

## 2017-08-31 DIAGNOSIS — J3089 Other allergic rhinitis: Secondary | ICD-10-CM | POA: Diagnosis not present

## 2017-08-31 DIAGNOSIS — J301 Allergic rhinitis due to pollen: Secondary | ICD-10-CM | POA: Diagnosis not present

## 2017-09-07 DIAGNOSIS — J301 Allergic rhinitis due to pollen: Secondary | ICD-10-CM | POA: Diagnosis not present

## 2017-09-07 DIAGNOSIS — J3089 Other allergic rhinitis: Secondary | ICD-10-CM | POA: Diagnosis not present

## 2017-09-08 ENCOUNTER — Other Ambulatory Visit: Payer: Self-pay | Admitting: Obstetrics & Gynecology

## 2017-09-08 DIAGNOSIS — Z1231 Encounter for screening mammogram for malignant neoplasm of breast: Secondary | ICD-10-CM

## 2017-09-15 DIAGNOSIS — J3089 Other allergic rhinitis: Secondary | ICD-10-CM | POA: Diagnosis not present

## 2017-09-15 DIAGNOSIS — J301 Allergic rhinitis due to pollen: Secondary | ICD-10-CM | POA: Diagnosis not present

## 2017-09-22 DIAGNOSIS — J3089 Other allergic rhinitis: Secondary | ICD-10-CM | POA: Diagnosis not present

## 2017-09-22 DIAGNOSIS — J301 Allergic rhinitis due to pollen: Secondary | ICD-10-CM | POA: Diagnosis not present

## 2017-09-24 DIAGNOSIS — F332 Major depressive disorder, recurrent severe without psychotic features: Secondary | ICD-10-CM | POA: Diagnosis not present

## 2017-09-28 DIAGNOSIS — J301 Allergic rhinitis due to pollen: Secondary | ICD-10-CM | POA: Diagnosis not present

## 2017-09-28 DIAGNOSIS — J3089 Other allergic rhinitis: Secondary | ICD-10-CM | POA: Diagnosis not present

## 2017-09-29 DIAGNOSIS — F411 Generalized anxiety disorder: Secondary | ICD-10-CM | POA: Diagnosis not present

## 2017-10-05 DIAGNOSIS — J301 Allergic rhinitis due to pollen: Secondary | ICD-10-CM | POA: Diagnosis not present

## 2017-10-05 DIAGNOSIS — J3089 Other allergic rhinitis: Secondary | ICD-10-CM | POA: Diagnosis not present

## 2017-10-07 DIAGNOSIS — J3089 Other allergic rhinitis: Secondary | ICD-10-CM | POA: Diagnosis not present

## 2017-10-07 DIAGNOSIS — J301 Allergic rhinitis due to pollen: Secondary | ICD-10-CM | POA: Diagnosis not present

## 2017-10-13 DIAGNOSIS — J3089 Other allergic rhinitis: Secondary | ICD-10-CM | POA: Diagnosis not present

## 2017-10-13 DIAGNOSIS — F411 Generalized anxiety disorder: Secondary | ICD-10-CM | POA: Diagnosis not present

## 2017-10-13 DIAGNOSIS — J301 Allergic rhinitis due to pollen: Secondary | ICD-10-CM | POA: Diagnosis not present

## 2017-10-19 DIAGNOSIS — J301 Allergic rhinitis due to pollen: Secondary | ICD-10-CM | POA: Diagnosis not present

## 2017-10-19 DIAGNOSIS — J3089 Other allergic rhinitis: Secondary | ICD-10-CM | POA: Diagnosis not present

## 2017-10-26 ENCOUNTER — Ambulatory Visit
Admission: RE | Admit: 2017-10-26 | Discharge: 2017-10-26 | Disposition: A | Discharge status: Home / Self Care | Payer: BLUE CROSS/BLUE SHIELD | Source: Ambulatory Visit | Attending: Obstetrics & Gynecology | Admitting: Obstetrics & Gynecology

## 2017-10-26 DIAGNOSIS — Z1231 Encounter for screening mammogram for malignant neoplasm of breast: Secondary | ICD-10-CM | POA: Diagnosis not present

## 2017-10-26 DIAGNOSIS — J3089 Other allergic rhinitis: Secondary | ICD-10-CM | POA: Diagnosis not present

## 2017-10-26 DIAGNOSIS — J301 Allergic rhinitis due to pollen: Secondary | ICD-10-CM | POA: Diagnosis not present

## 2017-10-28 DIAGNOSIS — F411 Generalized anxiety disorder: Secondary | ICD-10-CM | POA: Diagnosis not present

## 2017-11-02 DIAGNOSIS — J301 Allergic rhinitis due to pollen: Secondary | ICD-10-CM | POA: Diagnosis not present

## 2017-11-02 DIAGNOSIS — J3089 Other allergic rhinitis: Secondary | ICD-10-CM | POA: Diagnosis not present

## 2017-11-03 DIAGNOSIS — F332 Major depressive disorder, recurrent severe without psychotic features: Secondary | ICD-10-CM | POA: Diagnosis not present

## 2017-11-09 DIAGNOSIS — J301 Allergic rhinitis due to pollen: Secondary | ICD-10-CM | POA: Diagnosis not present

## 2017-11-09 DIAGNOSIS — J3089 Other allergic rhinitis: Secondary | ICD-10-CM | POA: Diagnosis not present

## 2017-11-10 DIAGNOSIS — F411 Generalized anxiety disorder: Secondary | ICD-10-CM | POA: Diagnosis not present

## 2017-11-30 DIAGNOSIS — J301 Allergic rhinitis due to pollen: Secondary | ICD-10-CM | POA: Diagnosis not present

## 2017-11-30 DIAGNOSIS — J3089 Other allergic rhinitis: Secondary | ICD-10-CM | POA: Diagnosis not present

## 2017-11-30 DIAGNOSIS — R8271 Bacteriuria: Secondary | ICD-10-CM | POA: Diagnosis not present

## 2017-12-03 DIAGNOSIS — J3089 Other allergic rhinitis: Secondary | ICD-10-CM | POA: Diagnosis not present

## 2017-12-03 DIAGNOSIS — J301 Allergic rhinitis due to pollen: Secondary | ICD-10-CM | POA: Diagnosis not present

## 2017-12-07 DIAGNOSIS — Z01419 Encounter for gynecological examination (general) (routine) without abnormal findings: Secondary | ICD-10-CM | POA: Diagnosis not present

## 2017-12-07 DIAGNOSIS — J301 Allergic rhinitis due to pollen: Secondary | ICD-10-CM | POA: Diagnosis not present

## 2017-12-07 DIAGNOSIS — J3089 Other allergic rhinitis: Secondary | ICD-10-CM | POA: Diagnosis not present

## 2017-12-09 DIAGNOSIS — J301 Allergic rhinitis due to pollen: Secondary | ICD-10-CM | POA: Diagnosis not present

## 2017-12-09 DIAGNOSIS — J3089 Other allergic rhinitis: Secondary | ICD-10-CM | POA: Diagnosis not present

## 2017-12-10 DIAGNOSIS — N3281 Overactive bladder: Secondary | ICD-10-CM | POA: Diagnosis not present

## 2017-12-14 DIAGNOSIS — J3089 Other allergic rhinitis: Secondary | ICD-10-CM | POA: Diagnosis not present

## 2017-12-14 DIAGNOSIS — J301 Allergic rhinitis due to pollen: Secondary | ICD-10-CM | POA: Diagnosis not present

## 2017-12-23 DIAGNOSIS — J301 Allergic rhinitis due to pollen: Secondary | ICD-10-CM | POA: Diagnosis not present

## 2017-12-23 DIAGNOSIS — J3089 Other allergic rhinitis: Secondary | ICD-10-CM | POA: Diagnosis not present

## 2017-12-24 DIAGNOSIS — N3941 Urge incontinence: Secondary | ICD-10-CM | POA: Diagnosis not present

## 2017-12-24 DIAGNOSIS — N3281 Overactive bladder: Secondary | ICD-10-CM | POA: Diagnosis not present

## 2017-12-24 DIAGNOSIS — R8271 Bacteriuria: Secondary | ICD-10-CM | POA: Diagnosis not present

## 2017-12-25 DIAGNOSIS — N139 Obstructive and reflux uropathy, unspecified: Secondary | ICD-10-CM | POA: Diagnosis not present

## 2017-12-28 DIAGNOSIS — J301 Allergic rhinitis due to pollen: Secondary | ICD-10-CM | POA: Diagnosis not present

## 2017-12-28 DIAGNOSIS — J3089 Other allergic rhinitis: Secondary | ICD-10-CM | POA: Diagnosis not present

## 2018-01-04 DIAGNOSIS — J3089 Other allergic rhinitis: Secondary | ICD-10-CM | POA: Diagnosis not present

## 2018-01-04 DIAGNOSIS — J301 Allergic rhinitis due to pollen: Secondary | ICD-10-CM | POA: Diagnosis not present

## 2018-01-11 DIAGNOSIS — J3089 Other allergic rhinitis: Secondary | ICD-10-CM | POA: Diagnosis not present

## 2018-01-11 DIAGNOSIS — J301 Allergic rhinitis due to pollen: Secondary | ICD-10-CM | POA: Diagnosis not present

## 2018-01-21 DIAGNOSIS — J3089 Other allergic rhinitis: Secondary | ICD-10-CM | POA: Diagnosis not present

## 2018-01-21 DIAGNOSIS — J301 Allergic rhinitis due to pollen: Secondary | ICD-10-CM | POA: Diagnosis not present

## 2018-01-22 DIAGNOSIS — N3941 Urge incontinence: Secondary | ICD-10-CM | POA: Diagnosis not present

## 2018-01-22 DIAGNOSIS — N3281 Overactive bladder: Secondary | ICD-10-CM | POA: Diagnosis not present

## 2018-01-23 DIAGNOSIS — J069 Acute upper respiratory infection, unspecified: Secondary | ICD-10-CM | POA: Diagnosis not present

## 2018-01-25 ENCOUNTER — Ambulatory Visit: Payer: BLUE CROSS/BLUE SHIELD | Admitting: Family Medicine

## 2018-01-25 ENCOUNTER — Encounter: Payer: Self-pay | Admitting: Family Medicine

## 2018-01-25 VITALS — BP 132/82 | HR 86 | Temp 98.5°F | Ht 68.0 in

## 2018-01-25 DIAGNOSIS — J40 Bronchitis, not specified as acute or chronic: Secondary | ICD-10-CM | POA: Diagnosis not present

## 2018-01-25 MED ORDER — AZITHROMYCIN 250 MG PO TABS: ORAL_TABLET | ORAL | 0 refills | Status: DC

## 2018-01-25 MED ORDER — BENZONATATE 100 MG PO CAPS: 100.0000 mg | ORAL_CAPSULE | Freq: Three times a day (TID) | ORAL | 0 refills | Status: DC | PRN

## 2018-01-25 NOTE — Patient Instructions (Addendum)
-  robitussin DM for cough during day -honey actually helps a lot. Can take 1 tsp/day   Acute Bronchitis, Adult Acute bronchitis is when air tubes (bronchi) in the lungs suddenly get swollen. The condition can make it hard to breathe. It can also cause these symptoms:  A cough.  Coughing up clear, yellow, or green mucus.  Wheezing.  Chest congestion.  Shortness of breath.  A fever.  Body aches.  Chills.  A sore throat.  Follow these instructions at home: Medicines  Take over-the-counter and prescription medicines only as told by your doctor.  If you were prescribed an antibiotic medicine, take it as told by your doctor. Do not stop taking the antibiotic even if you start to feel better. General instructions  Rest.  Drink enough fluids to keep your pee (urine) clear or pale yellow.  Avoid smoking and secondhand smoke. If you smoke and you need help quitting, ask your doctor. Quitting will help your lungs heal faster.  Use an inhaler, cool mist vaporizer, or humidifier as told by your doctor.  Keep all follow-up visits as told by your doctor. This is important. How is this prevented? To lower your risk of getting this condition again:  Wash your hands often with soap and water. If you cannot use soap and water, use hand sanitizer.  Avoid contact with people who have cold symptoms.  Try not to touch your hands to your mouth, nose, or eyes.  Make sure to get the flu shot every year.  Contact a doctor if:  Your symptoms do not get better in 2 weeks. Get help right away if:  You cough up blood.  You have chest pain.  You have very bad shortness of breath.  You become dehydrated.  You faint (pass out) or keep feeling like you are going to pass out.  You keep throwing up (vomiting).  You have a very bad headache.  Your fever or chills gets worse. This information is not intended to replace advice given to you by your health care provider. Make sure you  discuss any questions you have with your health care provider. Document Released: 09/24/2007 Document Revised: 11/14/2015 Document Reviewed: 09/26/2015 Elsevier Interactive Patient Education  [employer].

## 2018-01-25 NOTE — Progress Notes (Signed)
dPatient: Chelsey Jackson MRN: [id number] DOB: Sep 02, 1950 PCP: Briscoe Deutscher, DO     Subjective:  Chief Complaint  Patient presents with  . Cough    x 3 days  . chest congestion    HPI: The patient is a 67 y.o. female who presents today for cough and congestion. She was in [place] last week and on Tuesday last week her daughter went to doctor and was diagnosed with pneumonia and her fiance had it as well. She was around them all that week. She woke up Saturday morning with symptoms of a cold and went to [facility] urgent care and was told to come in in 48 hours if not better. She tells me now her chest is heavy and when she coughs it feels like it's ripping through her throat. She has no energy and is achy. No fever/chills. No shortness of breath and no wheezing. Has environmental asthma and tells me she is not really treated for it. She has not needed her rescue inhaler at all. Her cough is intermittently productive. No ear pain or sinus pain/pressure. She has been taking tessalon pearls with little relief. She also took a dose of mucinex. No other over the counter products. If she lays down she coughs more. She uses a cpap at night.   Review of Systems  Constitutional: Positive for fatigue. Negative for chills and fever.  HENT: Positive for congestion and sore throat. Negative for ear pain, sinus pressure and sinus pain.   Respiratory: Positive for cough and chest tightness. Negative for shortness of breath.   Cardiovascular: Negative for chest pain.  Gastrointestinal: Negative for abdominal pain and nausea.  Musculoskeletal: Positive for myalgias. Negative for back pain and neck pain.       Feels achey all over  Neurological: Negative for dizziness and headaches.    Allergies Patient is allergic to penicillins.  Past Medical History Patient  has a past medical history of AC (acromioclavicular) joint bone spurs, Allergy, Anxiety, Asthma, Depression, Fibrocystic breast disease, GERD  (gastroesophageal reflux disease), OSA (obstructive sleep apnea) (07/31/2013), Peptic ulcer disease, and TMJ locking.  Surgical History Patient  has a past surgical history that includes Tonsillectomy; Adenoidectomy; Abdominal hysterectomy (2003); Tubal ligation; Rhinoplasty (1984); Turbinate reduction (1997); Pubovaginal sling ('90's); Colonoscopy (03/10/2007); Rotator cuff repair (Right, 2013); Rotator cuff repair (Left, 2012); Shoulder surgery; Wrist surgery; Hammer toe surgery; and Breast cyst aspiration.  Family History Pateint's family history includes Breast cancer in her cousin and paternal aunt; Diabetes in her maternal grandmother and paternal grandfather; Fibroids in her mother; Heart disease in her father, maternal grandfather, and paternal grandmother; Lung cancer (age of onset: 49) in her mother; Osteoporosis in her mother; Other in her mother; Prostate cancer (age of onset: 53) in her father; Testicular cancer (age of onset: 21) in her father.  Social History Patient  reports that she quit smoking about 28 years ago. Her smoking use included cigarettes. She has a 30.00 pack-year smoking history. She has never used smokeless tobacco. She reports that she drinks alcohol. She reports that she does not use drugs.    Objective: Vitals:   01/25/18 0954  BP: 132/82  Pulse: 86  Temp: 98.5 F (36.9 C)  TempSrc: Oral  SpO2: 98%  Height: 5\' 8"  (1.727 m)    Body mass index is 28.98 kg/m.  Physical Exam  Constitutional: She is oriented to person, place, and time. She appears well-developed and well-nourished.  HENT:  Right Ear: External ear normal.  Left Ear:  External ear normal.  Nose: Nose normal.  Mouth/Throat: Oropharynx is clear and moist.  Hoarse voice   Eyes: Conjunctivae are normal.  Neck: Normal range of motion. Neck supple.  Cardiovascular: Normal rate, regular rhythm and normal heart sounds.  Pulmonary/Chest: Effort normal and breath sounds normal. No respiratory  distress. She has no wheezes. She has no rales.  Abdominal: Soft. Bowel sounds are normal.  Lymphadenopathy:    She has no cervical adenopathy.  Neurological: She is alert and oriented to person, place, and time.  Skin: Skin is warm and dry. Capillary refill takes less than 2 seconds.  Vitals reviewed.      Assessment/plan: 1. Bronchitis Conservative therapy with rest, fluids, honey and robitussin DM. Also sending in tessalon pearls for her. Will give her a zpack since exposed to 2 people diagnosed with pneumonia. Discussed with her that her lungs are clear on exam and she has had no fever and likely has more of a viral bronchitis. Discussed course can last up to 6 weeks of coughing. Let us know if fever/worsening symptoms and we can do CXR/follow up. Precautions given.     Return if symptoms worsen or fail to improve.    Orma Flaming, MD [place]   01/25/2018

## 2018-02-01 DIAGNOSIS — J301 Allergic rhinitis due to pollen: Secondary | ICD-10-CM | POA: Diagnosis not present

## 2018-02-01 DIAGNOSIS — J3089 Other allergic rhinitis: Secondary | ICD-10-CM | POA: Diagnosis not present

## 2018-02-07 IMAGING — MG 2D DIGITAL SCREENING BILATERAL MAMMOGRAM WITH CAD AND ADJUNCT TO
8 of 12 series · 8 of 28 positions shown · non-contrast
Comparison: Previous exam(s).

CLINICAL DATA: Screening.

EXAM:
2D DIGITAL SCREENING BILATERAL MAMMOGRAM WITH CAD AND ADJUNCT TOMO

[R MLO synth-2D]
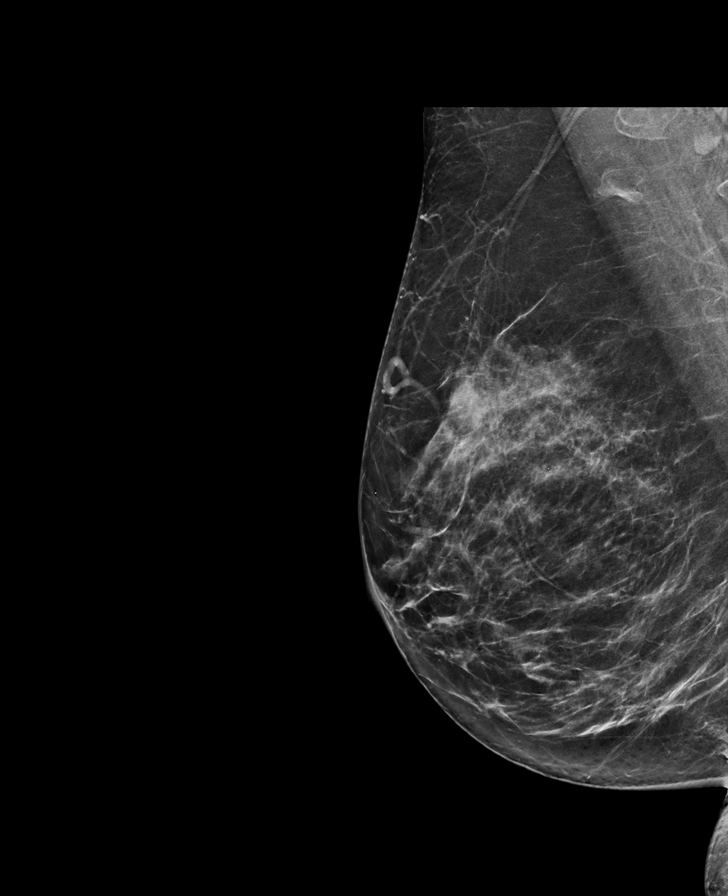

[L CC]
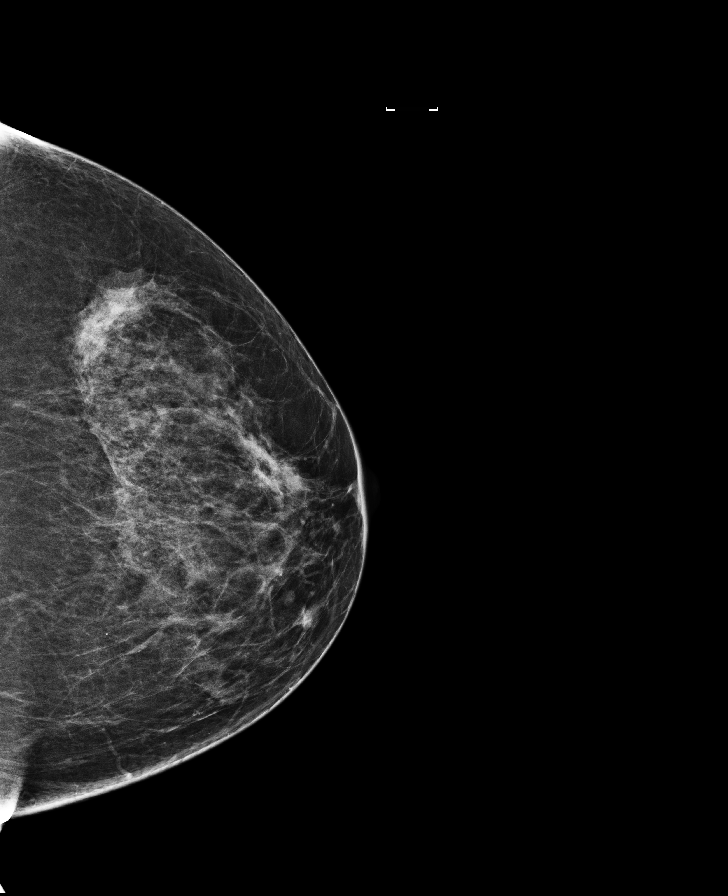

[L MLO synth-2D]
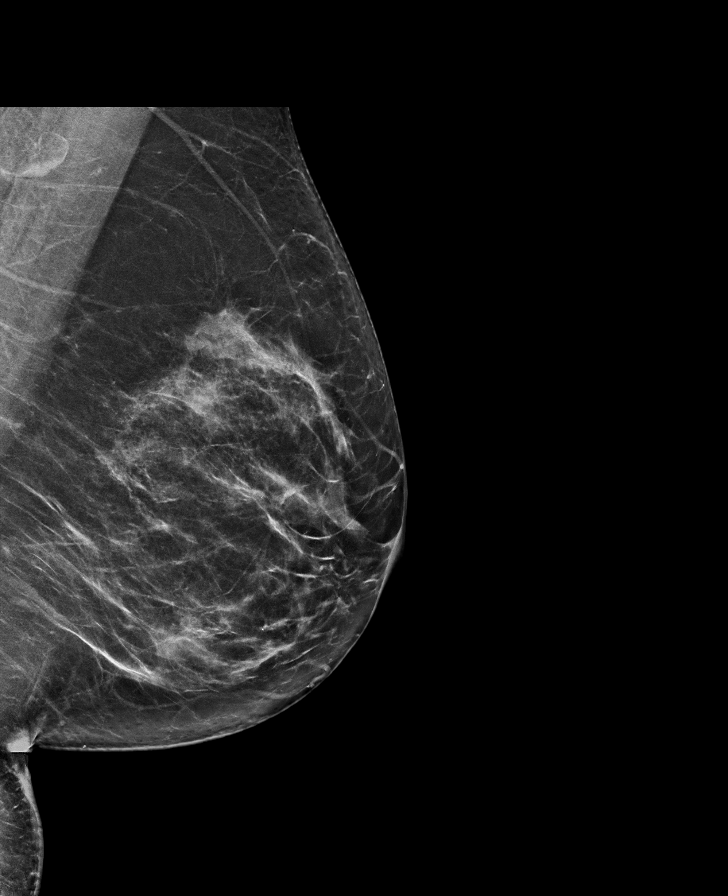

[L MLO]
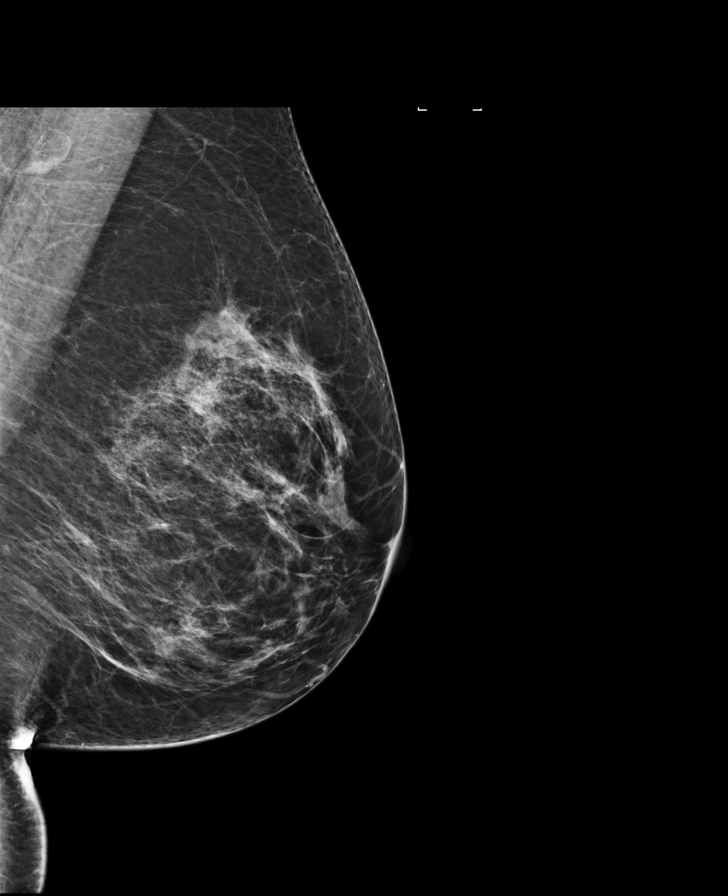

[R CC]
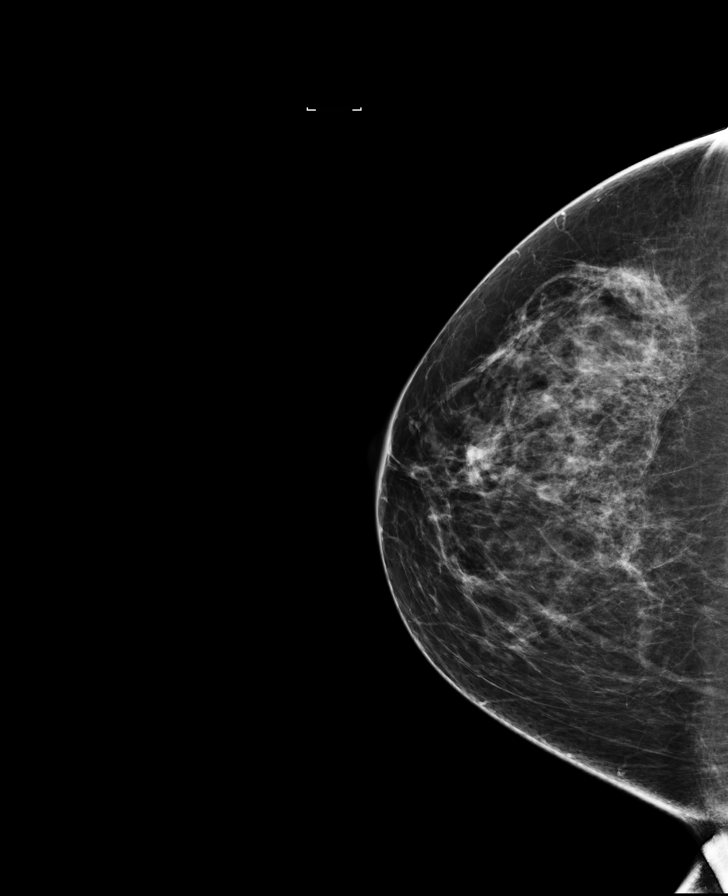

[R MLO]
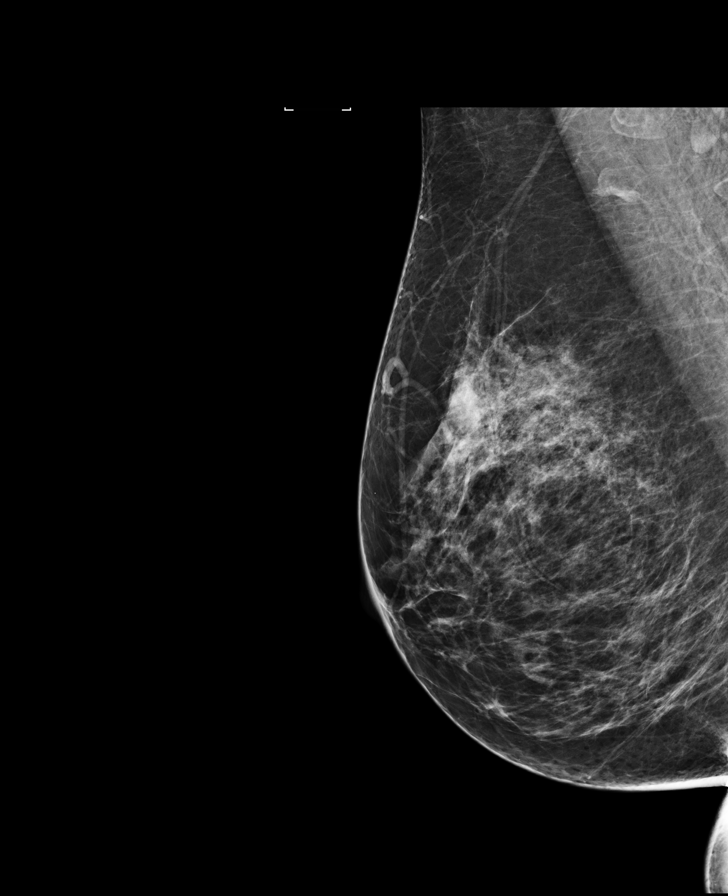

[R CC synth-2D]
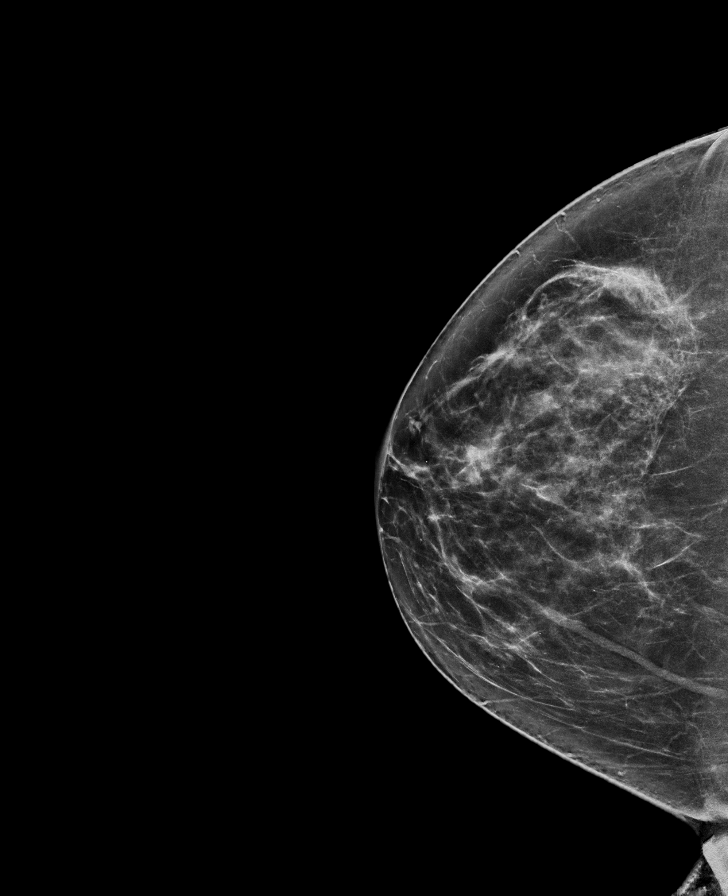

[L CC synth-2D]
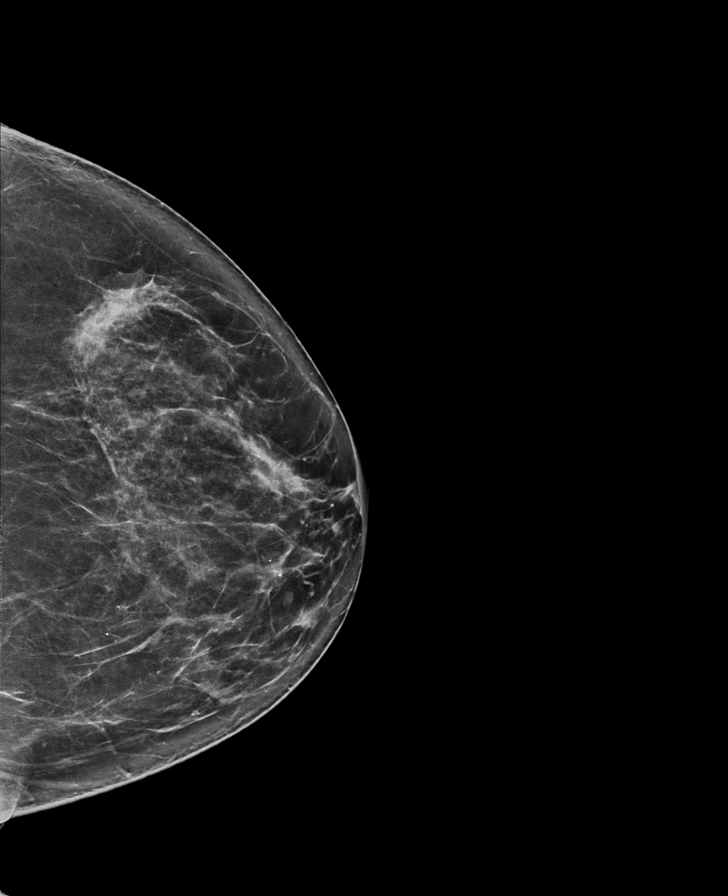

[8 of 28 positions shown; findings below may reference images not displayed]

ACR Breast Density Category c: The breast tissue is heterogeneously
dense, which may obscure small masses.
FINDINGS: There are no findings suspicious for malignancy. Images were
processed with CAD.
IMPRESSION: No mammographic evidence of malignancy. A result letter of this
screening mammogram will be mailed directly to the patient.

RECOMMENDATION:
Screening mammogram in one year. (Code:[id number])

BI-RADS CATEGORY  1: Negative.

## 2018-02-09 DIAGNOSIS — J301 Allergic rhinitis due to pollen: Secondary | ICD-10-CM | POA: Diagnosis not present

## 2018-02-09 DIAGNOSIS — J3089 Other allergic rhinitis: Secondary | ICD-10-CM | POA: Diagnosis not present

## 2018-02-15 DIAGNOSIS — J301 Allergic rhinitis due to pollen: Secondary | ICD-10-CM | POA: Diagnosis not present

## 2018-02-15 DIAGNOSIS — J3089 Other allergic rhinitis: Secondary | ICD-10-CM | POA: Diagnosis not present

## 2018-02-15 DIAGNOSIS — G4723 Circadian rhythm sleep disorder, irregular sleep wake type: Secondary | ICD-10-CM | POA: Diagnosis not present

## 2018-02-17 DIAGNOSIS — F411 Generalized anxiety disorder: Secondary | ICD-10-CM | POA: Diagnosis not present

## 2018-02-19 ENCOUNTER — Ambulatory Visit: Payer: BLUE CROSS/BLUE SHIELD | Admitting: Pulmonary Disease

## 2018-02-19 ENCOUNTER — Encounter: Payer: Self-pay | Admitting: Pulmonary Disease

## 2018-02-19 VITALS — BP 122/82 | HR 70 | Ht 68.0 in | Wt 190.0 lb

## 2018-02-19 DIAGNOSIS — Z9989 Dependence on other enabling machines and devices: Secondary | ICD-10-CM | POA: Diagnosis not present

## 2018-02-19 DIAGNOSIS — G4733 Obstructive sleep apnea (adult) (pediatric): Secondary | ICD-10-CM

## 2018-02-19 NOTE — Patient Instructions (Signed)
Will have [facility] change your CPAP to 9 cm H2O  Follow up in 1 year

## 2018-02-19 NOTE — Progress Notes (Signed)
[facility]  Chief Complaint  Patient presents with  . Follow-up    Pt is doing well with cpap machine.     Constitutional:  BP 122/82 (BP Location: Left Arm, Cuff Size: Normal)   Pulse 70   Ht 5\' 8"  (1.727 m)   Wt 190 lb (86.2 kg)   LMP  (LMP Unknown)   SpO2 98%   BMI 28.89 kg/m   Past Medical History:  Allergies, Depression, Anxiety, Asthma, GERD, PUD, TMJ  Brief Summary:  Chelsey Jackson is a 67 y.o. female with obstructive sleep apnea.  She realizes CPAP is good for her, but still struggles with concept of using CPAP.  She has trouble getting supplies from her DME and sometimes orders supplies on her own online.  She has most trouble with masks.  This will slip sometimes, and then pressure ramps up and wakes her up.  She isn't having dry mouth, sore throat, sinus congestion, or aerophagia.  Physical Exam:   Appearance - well kempt   ENMT - clear nasal mucosa, midline nasal  septum, no oral exudates, no LAN, trachea midline, MP 4, over bite, scalloped tongue  Respiratory - normal chest wall, normal respiratory effort, no accessory muscle use, no wheeze/rales  CV - s1s2 regular rate and rhythm, no murmurs, no peripheral edema, radial pulses symmetric  GI - soft, non tender, no masses  Lymph - no adenopathy noted in neck and axillary areas  MSK - normal gait  Ext - no cyanosis, clubbing, or joint inflammation noted  Skin - no rashes, lesions, or ulcers  Neuro - normal strength, oriented x 3  Psych - normal mood and affect   Assessment/Plan:   Obstructive sleep apnea. - she is compliant with CPAP - will change from auto CPAP to fixed CPAP setting of 9 cm H2O - discussed options for mask fit - reviewed alternative therapies for sleep apnea   Patient Instructions  Will have [facility] change your CPAP to 9 cm H2O  Follow up in 1 year    [name], MD [place] Pager:  [phone] 02/19/2018, 3:13 PM  Flow Sheet    Sleep tests:  PSG 10/11/13 >> AHI 38.7, SaO2 low 74%.  CPAP 15 >> AHI 0, +R, +S.  Decreased SaO2 during REM. Auto CPAP 11/03/16 to 12/02/16 >> used on 24 of 30 nights with average 7 hrs.  Average AHI 1.4 with median CPAP 9 and 95 th percentile CPAP 12 cm H2O  Medications:   Allergies as of 02/19/2018      Reactions   Penicillins Anaphylaxis   Stopped breathing REACTION: stop breathing      Medication List        Accurate as of 02/19/18  3:13 PM. Always use your most recent med list.          CALTRATE 600+D 600-400 MG-UNIT tablet Generic drug:  Calcium Carbonate-Vitamin D Take 1 tablet by mouth daily.   EPINEPHrine 0.3 mg/0.3 mL Soaj injection Commonly known as:  EPI-PEN Inject into the muscle once.   fluticasone 50 MCG/ACT nasal spray Commonly known as:  FLONASE Place 2 sprays into both nostrils daily.   levocetirizine 5 MG tablet Commonly known as:  XYZAL Take 5 mg by mouth every evening.   multivitamin tablet Take 1 tablet by mouth daily.   PROAIR RESPICLICK 892 (90 Base) MCG/ACT Aepb Generic drug:  Albuterol Sulfate as needed.   temazepam 30 MG capsule Commonly known as:  RESTORIL Take 30 mg by mouth at bedtime as needed for sleep.   VITRON-C 65-125 MG Tabs Generic drug:  Iron-Vitamin C Take 1 tablet by mouth daily.       Past Surgical History:  She  has a past surgical history that includes Tonsillectomy; Adenoidectomy; Abdominal hysterectomy (2003); Tubal ligation; Rhinoplasty (1984); Turbinate reduction (1997); Pubovaginal sling ('90's); Colonoscopy (03/10/2007); Rotator cuff repair (Right, 2013); Rotator cuff repair (Left, 2012); Shoulder surgery; Wrist surgery; Hammer toe surgery; and Breast cyst aspiration.  Family History:  Her family history includes Breast cancer in her cousin and paternal aunt; Diabetes in her maternal grandmother and paternal grandfather; Fibroids in her mother; Heart disease in  her father, maternal grandfather, and paternal grandmother; Lung cancer (age of onset: 55) in her mother; Osteoporosis in her mother; Other in her mother; Prostate cancer (age of onset: 63) in her father; Testicular cancer (age of onset: 30) in her father.  Social History:  She  reports that she quit smoking about 28 years ago. Her smoking use included cigarettes. She has a 30.00 pack-year smoking history. She has never used smokeless tobacco. She reports that she drinks alcohol. She reports that she does not use drugs.

## 2018-02-22 DIAGNOSIS — J301 Allergic rhinitis due to pollen: Secondary | ICD-10-CM | POA: Diagnosis not present

## 2018-02-22 DIAGNOSIS — J3089 Other allergic rhinitis: Secondary | ICD-10-CM | POA: Diagnosis not present

## 2018-02-24 DIAGNOSIS — J301 Allergic rhinitis due to pollen: Secondary | ICD-10-CM | POA: Diagnosis not present

## 2018-02-24 DIAGNOSIS — J4520 Mild intermittent asthma, uncomplicated: Secondary | ICD-10-CM | POA: Diagnosis not present

## 2018-02-24 DIAGNOSIS — J3089 Other allergic rhinitis: Secondary | ICD-10-CM | POA: Diagnosis not present

## 2018-02-24 DIAGNOSIS — J3081 Allergic rhinitis due to animal (cat) (dog) hair and dander: Secondary | ICD-10-CM | POA: Diagnosis not present

## 2018-03-02 DIAGNOSIS — J3089 Other allergic rhinitis: Secondary | ICD-10-CM | POA: Diagnosis not present

## 2018-03-02 DIAGNOSIS — J301 Allergic rhinitis due to pollen: Secondary | ICD-10-CM | POA: Diagnosis not present

## 2018-03-09 DIAGNOSIS — J301 Allergic rhinitis due to pollen: Secondary | ICD-10-CM | POA: Diagnosis not present

## 2018-03-09 DIAGNOSIS — F411 Generalized anxiety disorder: Secondary | ICD-10-CM | POA: Diagnosis not present

## 2018-03-09 DIAGNOSIS — J3089 Other allergic rhinitis: Secondary | ICD-10-CM | POA: Diagnosis not present

## 2018-03-17 DIAGNOSIS — J301 Allergic rhinitis due to pollen: Secondary | ICD-10-CM | POA: Diagnosis not present

## 2018-03-17 DIAGNOSIS — J3089 Other allergic rhinitis: Secondary | ICD-10-CM | POA: Diagnosis not present

## 2018-03-22 DIAGNOSIS — J3089 Other allergic rhinitis: Secondary | ICD-10-CM | POA: Diagnosis not present

## 2018-03-22 DIAGNOSIS — J301 Allergic rhinitis due to pollen: Secondary | ICD-10-CM | POA: Diagnosis not present

## 2018-03-24 DIAGNOSIS — N3941 Urge incontinence: Secondary | ICD-10-CM | POA: Diagnosis not present

## 2018-03-25 DIAGNOSIS — F411 Generalized anxiety disorder: Secondary | ICD-10-CM | POA: Diagnosis not present

## 2018-03-30 DIAGNOSIS — H43813 Vitreous degeneration, bilateral: Secondary | ICD-10-CM | POA: Diagnosis not present

## 2018-03-30 DIAGNOSIS — H26492 Other secondary cataract, left eye: Secondary | ICD-10-CM | POA: Diagnosis not present

## 2018-03-30 DIAGNOSIS — H04123 Dry eye syndrome of bilateral lacrimal glands: Secondary | ICD-10-CM | POA: Diagnosis not present

## 2018-03-31 DIAGNOSIS — J3089 Other allergic rhinitis: Secondary | ICD-10-CM | POA: Diagnosis not present

## 2018-03-31 DIAGNOSIS — J301 Allergic rhinitis due to pollen: Secondary | ICD-10-CM | POA: Diagnosis not present

## 2018-04-01 DIAGNOSIS — F332 Major depressive disorder, recurrent severe without psychotic features: Secondary | ICD-10-CM | POA: Diagnosis not present

## 2018-04-05 DIAGNOSIS — J301 Allergic rhinitis due to pollen: Secondary | ICD-10-CM | POA: Diagnosis not present

## 2018-04-05 DIAGNOSIS — J3089 Other allergic rhinitis: Secondary | ICD-10-CM | POA: Diagnosis not present

## 2018-04-08 DIAGNOSIS — G4723 Circadian rhythm sleep disorder, irregular sleep wake type: Secondary | ICD-10-CM | POA: Diagnosis not present

## 2018-04-08 DIAGNOSIS — G4733 Obstructive sleep apnea (adult) (pediatric): Secondary | ICD-10-CM | POA: Diagnosis not present

## 2018-04-23 ENCOUNTER — Encounter: Payer: Self-pay | Admitting: Physician Assistant

## 2018-04-23 ENCOUNTER — Ambulatory Visit: Payer: BLUE CROSS/BLUE SHIELD | Admitting: Physician Assistant

## 2018-04-23 VITALS — BP 140/82 | HR 64 | Temp 98.9°F | Ht 68.0 in

## 2018-04-23 DIAGNOSIS — M545 Low back pain, unspecified: Secondary | ICD-10-CM

## 2018-04-23 DIAGNOSIS — J301 Allergic rhinitis due to pollen: Secondary | ICD-10-CM | POA: Diagnosis not present

## 2018-04-23 DIAGNOSIS — J3089 Other allergic rhinitis: Secondary | ICD-10-CM | POA: Diagnosis not present

## 2018-04-23 MED ORDER — CYCLOBENZAPRINE HCL 5 MG PO TABS: 5.0000 mg | ORAL_TABLET | Freq: Three times a day (TID) | ORAL | 0 refills | Status: DC | PRN

## 2018-04-23 MED ORDER — METHYLPREDNISOLONE 4 MG PO TBPK: ORAL_TABLET | ORAL | 0 refills | Status: DC

## 2018-04-23 NOTE — Patient Instructions (Signed)
Start the oral prednisone. May use flexeril as needed for muscle spasm. May cause sedation, so please be careful with flexeril.  If no improvement by Monday, please call back so we can get you in with [facility].

## 2018-04-23 NOTE — Progress Notes (Signed)
Chelsey Jackson is a 68 y.o. female here for a follow up of a pre-existing problem.  I acted as a Education administrator for [employer], PA-C Anselmo Pickler, LPN  History of Present Illness:   Chief Complaint  Patient presents with  . Back Pain    Back Pain  This is a chronic problem. Episode onset: Started this past Tuesday. The problem occurs constantly. The problem has been gradually worsening since onset. The pain is present in the lumbar spine (Right side and radiates across lower back). The quality of the pain is described as stabbing. The pain does not radiate. The pain is at a severity of 7/10. The pain is moderate. The pain is the same all the time. The symptoms are aggravated by position. Stiffness is present: none. Associated symptoms include headaches. Pertinent negatives include no dysuria, fever, leg pain, numbness, tingling or weakness. She has tried heat (Aleve, OTC patches and heating pad) for the symptoms. The treatment provided mild relief.   She has seen Dr. Paulla Fore in the past for these symptoms. She has also seen Lyndee Hensen for PT in our office.  She was spending a lot of time with her sick father in the hospital and recently was on a plane, has not been able to do her PT exercises.  Denies: dysuria, fever, unintentional weight changes, saddle anesthesia  Past Medical History:  Diagnosis Date  . 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 Allergy    weekly allergy shots  . Anxiety   . Asthma   . Depression   . Fibrocystic breast disease   . GERD (gastroesophageal reflux disease)   . OSA (obstructive sleep apnea) 07/31/2013  . Peptic ulcer disease   . TMJ locking      Social History   Socioeconomic History  . Marital status: Married    Spouse name: Not on file  . Number of children: Not on file  . Years of education: Not on file  . Highest education level: Not on file  Occupational History  . Not on file   Social Needs  . Financial resource strain: Not on file  . Food insecurity:    Worry: Not on file    Inability: Not on file  . Transportation needs:    Medical: Not on file    Non-medical: Not on file  Tobacco Use  . Smoking status: Former Smoker    Packs/day: 2.00    Years: 15.00    Pack years: 30.00    Types: Cigarettes    Last attempt to quit: 11/19/1989    Years since quitting: 28.4  . Smokeless tobacco: Never Used  Substance and Sexual Activity  . Alcohol use: Yes    Comment: maybe a glass of wine on the weekend  . Drug use: No  . Sexual activity: Not on file  Lifestyle  . Physical activity:    Days per week: Not on file    Minutes per session: Not on file  . Stress: Not on file  Relationships  . Social connections:    Talks on phone: Not on file    Gets together: Not on file    Attends religious service: Not on file    Active member of club or organization: Not on file    Attends meetings of clubs or organizations: Not on file    Relationship status: Not on file  . Intimate partner violence:    Fear of  current or ex partner: Not on file    Emotionally abused: Not on file    Physically abused: Not on file    Forced sexual activity: Not on file  Other Topics Concern  . 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 ABDOMINAL HYSTERECTOMY  2003   due to fibroids  . ADENOIDECTOMY     x3  . BREAST CYST ASPIRATION    . COLONOSCOPY  03/10/2007   results-normal  . HAMMER TOE SURGERY    . PUBOVAGINAL SLING  '90's   for incontinence  . RHINOPLASTY  1984  . ROTATOR CUFF REPAIR Right 2013   [facility]  . ROTATOR CUFF REPAIR Left 2012   [place]  . SHOULDER SURGERY    . TONSILLECTOMY     x2  . TUBAL LIGATION    . [place]  . WRIST SURGERY      Family History  Problem Relation  Age of Onset  . Osteoporosis Mother   . Fibroids Mother   . Other Mother        aortic valve replacement 1994  . Lung cancer Mother 63  . Heart disease Father   . Testicular cancer Father 59  . Prostate cancer Father 60  . Diabetes Maternal Grandmother   . Heart disease Maternal Grandfather   . Heart disease Paternal Grandmother   . Diabetes Paternal Grandfather   . Breast cancer Paternal Aunt   . Breast cancer Cousin     Allergies  Allergen Reactions  . Penicillins Anaphylaxis    Stopped breathing REACTION: stop breathing    Current Medications:   Current Outpatient Medications:  .  buPROPion (WELLBUTRIN XL) 300 MG 24 hr tablet, Take 300 mg by mouth daily., Disp: , Rfl:  .  Calcium Carbonate-Vitamin D (CALTRATE 600+D) 600-400 MG-UNIT per tablet, Take 1 tablet by mouth daily.  , Disp: , Rfl:  .  EPINEPHrine 0.3 mg/0.3 mL IJ SOAJ injection, Inject into the muscle once., Disp: , Rfl:  .  fluticasone (FLONASE) 50 MCG/ACT nasal spray, Place 2 sprays into both nostrils daily. , Disp: , Rfl:  .  Iron-Vitamin C (VITRON-C) 65-125 MG TABS, Take 1 tablet by mouth daily., Disp: , Rfl:  .  levocetirizine (XYZAL) 5 MG tablet, Take 5 mg by mouth every evening., Disp: , Rfl:  .  Multiple Vitamin (MULTIVITAMIN) tablet, Take 1 tablet by mouth daily.  , Disp: , Rfl:  .  PROAIR RESPICLICK 846 (90 Base) MCG/ACT AEPB, as needed. , Disp: , Rfl:  .  temazepam (RESTORIL) 30 MG capsule, Take 30 mg by mouth at bedtime as needed for sleep., Disp: , Rfl:  .  cyclobenzaprine (FLEXERIL) 5 MG tablet, Take 1 tablet (5 mg total) by mouth 3 (three) times daily as needed for muscle spasms., Disp: 20 tablet, Rfl: 0 .  methylPREDNISolone (MEDROL DOSEPAK) 4 MG TBPK tablet, 6-5-4-3-2-1-off, Disp: 21 tablet, Rfl: 0   Review of Systems:   Review of Systems  Constitutional: Negative for fever.  Genitourinary: Negative for dysuria.  Musculoskeletal: Positive for back pain.  Neurological: Positive for headaches.  Negative for tingling, weakness and numbness.    Vitals:   Vitals:   04/23/18 1322  BP: 140/82  Pulse: 64  Temp: 98.9 F (37.2 C)  TempSrc:  Oral  SpO2: 96%  Height: 5\' 8"  (1.727 m)     Body mass index is 28.89 kg/m.  Physical Exam:   Physical Exam Vitals signs and nursing note reviewed.  Constitutional:      General: She is not in acute distress.    Appearance: She is well-developed. She is not ill-appearing or toxic-appearing.  Cardiovascular:     Rate and Rhythm: Normal rate and regular rhythm.     Pulses: Normal pulses.     Heart sounds: Normal heart sounds, S1 normal and S2 normal.     Comments: No LE edema Pulmonary:     Effort: Pulmonary effort is normal.     Breath sounds: Normal breath sounds.  Musculoskeletal:     Comments: Decreased ROM 2/2 pain with flexion/extension, lateral side bends, or rotation. Reproducible tenderness with deep palpation to right paraspinal muscles. No bony tenderness. No evidence of erythema, rash or ecchymosis. Negative STLR bilaterally.   Skin:    General: Skin is warm and dry.  Neurological:     Mental Status: She is alert.     GCS: GCS eye subscore is 4. GCS verbal subscore is 5. GCS motor subscore is 6.     Deep Tendon Reflexes:     Reflex Scores:      Patellar reflexes are 2+ on the right side and 2+ on the left side. Psychiatric:        Speech: Speech normal.        Behavior: Behavior normal. Behavior is cooperative.      Assessment and Plan:   Maleka was seen today for back pain.  Diagnoses and all orders for this visit:  Right low back pain, unspecified chronicity, unspecified whether sciatica present  Other orders -     cyclobenzaprine (FLEXERIL) 5 MG tablet; Take 1 tablet (5 mg total) by mouth 3 (three) times daily as needed for muscle spasms. -     methylPREDNISolone (MEDROL DOSEPAK) 4 MG TBPK tablet; 6-5-4-3-2-1-off   No red flags on exam. She has seen Paulla Fore in the past and did well with Flexeril and  Medrol dose pack. I have sent this in for her. If no improvement by end of weekend, follow-up with Dr. Paulla Fore for further evaluation. Advised caution with Flexeril.   . Reviewed expectations re: course of current medical issues. . Discussed self-management of symptoms. . Outlined signs and symptoms indicating need for more acute intervention. . Patient verbalized understanding and all questions were answered. . See orders for this visit as documented in the electronic medical record. . Patient received an After-Visit Summary.  CMA or LPN served as scribe during this visit. History, Physical, and Plan performed by medical provider. The above documentation has been reviewed and is accurate and complete.   [name], PA-C

## 2018-04-26 ENCOUNTER — Encounter: Payer: Self-pay | Admitting: Physician Assistant

## 2018-04-26 ENCOUNTER — Ambulatory Visit (INDEPENDENT_AMBULATORY_CARE_PROVIDER_SITE_OTHER): Payer: BLUE CROSS/BLUE SHIELD

## 2018-04-26 ENCOUNTER — Ambulatory Visit: Payer: BLUE CROSS/BLUE SHIELD | Admitting: Physician Assistant

## 2018-04-26 VITALS — BP 150/98 | HR 62 | Temp 98.8°F | Ht 68.0 in | Wt 191.5 lb

## 2018-04-26 DIAGNOSIS — R300 Dysuria: Secondary | ICD-10-CM

## 2018-04-26 DIAGNOSIS — M545 Low back pain, unspecified: Secondary | ICD-10-CM

## 2018-04-26 DIAGNOSIS — Z1211 Encounter for screening for malignant neoplasm of colon: Secondary | ICD-10-CM

## 2018-04-26 DIAGNOSIS — M4807 Spinal stenosis, lumbosacral region: Secondary | ICD-10-CM | POA: Diagnosis not present

## 2018-04-26 DIAGNOSIS — J3089 Other allergic rhinitis: Secondary | ICD-10-CM | POA: Diagnosis not present

## 2018-04-26 DIAGNOSIS — R1030 Lower abdominal pain, unspecified: Secondary | ICD-10-CM

## 2018-04-26 DIAGNOSIS — J301 Allergic rhinitis due to pollen: Secondary | ICD-10-CM | POA: Diagnosis not present

## 2018-04-26 DIAGNOSIS — R195 Other fecal abnormalities: Secondary | ICD-10-CM | POA: Diagnosis not present

## 2018-04-26 LAB — POCT URINALYSIS DIPSTICK
Bilirubin, UA: NEGATIVE
Glucose, UA: NEGATIVE
Ketones, UA: NEGATIVE
Leukocytes, UA: NEGATIVE
NITRITE UA: NEGATIVE
PH UA: 6 (ref 5.0–8.0)
PROTEIN UA: NEGATIVE
RBC UA: NEGATIVE
SPEC GRAV UA: 1.02 (ref 1.010–1.025)
UROBILINOGEN UA: 0.2 U/dL

## 2018-04-26 MED ORDER — GABAPENTIN 100 MG PO CAPS: ORAL_CAPSULE | ORAL | 0 refills | Status: DC

## 2018-04-26 NOTE — Patient Instructions (Signed)
It was great to see you!  We will call you with your xray results.  Start 100 mg gabapentin nightly. You may increase to 2 tablets (200 mg total) if tolerated. Do not increase to more than 3 tablets (300 mg total) until seeing [place] again.  I have put in referral to [facility] (PT) and your colonoscopy.  Please consider scheduling a follow-up with Paulla Fore for the beginning of next week.  Take care,  [name] PA-C

## 2018-04-26 NOTE — Progress Notes (Signed)
Chelsey Jackson is a 68 y.o. female here for a follow up of a pre-existing problem.  I acted as a Education administrator for [employer], PA-C Anselmo Pickler, LPN  History of Present Illness:   Chief Complaint  Patient presents with  . Back Pain   HPI  Patient returns for follow-up of back pain.  She was last seen in our office on 04/23/18. She was diagnosed with low back pain and prescribed flexeril and medrol dose pack. She states that her pain has not increased but has not improved. She is having some intermittent lower abdominal pain. Denies d/n/v. She denies constipation but states that she did have a "hard" stool this morning. Denies bleeding from rectum, numbness/tingling to extremities, fever, unusual weight loss, decreased appetite.  She has seen Dr. Paulla Fore for back pain about 1 year ago. She has seen Lyndee Hensen PT for about 6 months ago.  She is taking the medrol dose pack. Took about 3 doses of flexeril but did not find it to be helpful.  Past Medical History:  Diagnosis Date  . 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 Allergy    weekly allergy shots  . Anxiety   . Asthma   . Depression   . Fibrocystic breast disease   . GERD (gastroesophageal reflux disease)   . OSA (obstructive sleep apnea) 07/31/2013  . Peptic ulcer disease   . TMJ locking      Social History   Socioeconomic History  . Marital status: Married    Spouse name: Not on file  . Number of children: Not on file  . Years of education: Not on file  . Highest education level: Not on file  Occupational History  . Not on file  Social Needs  . Financial resource strain: Not on file  . Food insecurity:    Worry: Not on file    Inability: Not on file  . Transportation needs:    Medical: Not on file    Non-medical: Not on file  Tobacco Use  . Smoking status: Former Smoker    Packs/day: 2.00    Years: 15.00    Pack years: 30.00    Types: Cigarettes     Last attempt to quit: 11/19/1989    Years since quitting: 28.4  . Smokeless tobacco: Never Used  Substance and Sexual Activity  . Alcohol use: Yes    Comment: maybe a glass of wine on the weekend  . Drug use: No  . Sexual activity: Not on file  Lifestyle  . Physical activity:    Days per week: Not on file    Minutes per session: Not on file  . Stress: Not on file  Relationships  . Social connections:    Talks on phone: Not on file    Gets together: Not on file    Attends religious service: Not on file    Active member of club or organization: Not on file    Attends meetings of clubs or organizations: Not on file    Relationship status: Not on file  . Intimate partner violence:    Fear of current or ex partner: Not on file    Emotionally abused: Not on file    Physically abused: Not on file    Forced sexual activity: Not on file  Other Topics Concern  . 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 ABDOMINAL HYSTERECTOMY  2003   due to fibroids  . ADENOIDECTOMY     x3  . BREAST CYST ASPIRATION    . COLONOSCOPY  03/10/2007   results-normal  . HAMMER TOE SURGERY    . PUBOVAGINAL SLING  '90's   for incontinence  . RHINOPLASTY  1984  . ROTATOR CUFF REPAIR Right 2013   [place]  . ROTATOR CUFF REPAIR Left 2012   [place]  . SHOULDER SURGERY    . TONSILLECTOMY     x2  . TUBAL LIGATION    . [place]  . WRIST SURGERY      Family History  Problem Relation Age of Onset  . Osteoporosis Mother   . Fibroids Mother   . Other Mother        aortic valve replacement 1994  . Lung cancer Mother 53  . Heart disease Father   . Testicular cancer Father 1  . Prostate cancer Father 31  . Diabetes Maternal Grandmother   . Heart disease Maternal Grandfather   . Heart disease Paternal  Grandmother   . Diabetes Paternal Grandfather   . Breast cancer Paternal Aunt   . Breast cancer Cousin     Allergies  Allergen Reactions  . Penicillins Anaphylaxis    Stopped breathing REACTION: stop breathing    Current Medications:   Current Outpatient Medications:  .  buPROPion (WELLBUTRIN XL) 300 MG 24 hr tablet, Take 300 mg by mouth daily., Disp: , Rfl:  .  Calcium Carbonate-Vitamin D (CALTRATE 600+D) 600-400 MG-UNIT per tablet, Take 1 tablet by mouth daily.  , Disp: , Rfl:  .  cyclobenzaprine (FLEXERIL) 5 MG tablet, Take 1 tablet (5 mg total) by mouth 3 (three) times daily as needed for muscle spasms., Disp: 20 tablet, Rfl: 0 .  EPINEPHrine 0.3 mg/0.3 mL IJ SOAJ injection, Inject into the muscle once., Disp: , Rfl:  .  fluticasone (FLONASE) 50 MCG/ACT nasal spray, Place 2 sprays into both nostrils daily. , Disp: , Rfl:  .  Iron-Vitamin C (VITRON-C) 65-125 MG TABS, Take 1 tablet by mouth daily., Disp: , Rfl:  .  levocetirizine (XYZAL) 5 MG tablet, Take 5 mg by mouth every evening., Disp: , Rfl:  .  methylPREDNISolone (MEDROL DOSEPAK) 4 MG TBPK tablet, 6-5-4-3-2-1-off, Disp: 21 tablet, Rfl: 0 .  Multiple Vitamin (MULTIVITAMIN) tablet, Take 1 tablet by mouth daily.  , Disp: , Rfl:  .  PROAIR RESPICLICK 505 (90 Base) MCG/ACT AEPB, as needed. , Disp: , Rfl:  .  temazepam (RESTORIL) 30 MG capsule, Take 30 mg by mouth at bedtime as needed for sleep., Disp: , Rfl:  .  gabapentin (NEURONTIN) 100 MG capsule, Take 1 tablet nightly. May increase to max of 3 tablets nightly., Disp: 60 capsule, Rfl: 0   Review of Systems:   Review of Systems  Negative unless otherwise specified per HPI.  Vitals:   Vitals:   04/26/18 1123 04/26/18 1137  BP: (!) 150/86 (!) 150/98  Pulse: (!) 59 62  Temp: 98.8 F (37.1 C)   TempSrc: Oral   SpO2: 96%   Weight: 191 lb 8 oz (86.9 kg)   Height: 5\' 8"  (1.727 m)      Body mass index is 29.12 kg/m.  Physical Exam:   Physical Exam Vitals signs and  nursing note reviewed.  Constitutional:  General: She is not in acute distress.    Appearance: She is well-developed. She is not ill-appearing or toxic-appearing.  Cardiovascular:     Rate and Rhythm: Normal rate and regular rhythm.     Pulses: Normal pulses.     Heart sounds: Normal heart sounds, S1 normal and S2 normal.     Comments: No LE edema Pulmonary:     Effort: Pulmonary effort is normal.     Breath sounds: Normal breath sounds.  Abdominal:     General: Abdomen is flat. Bowel sounds are normal.     Palpations: Abdomen is soft.     Tenderness: There is abdominal tenderness in the right lower quadrant and left lower quadrant. There is no right CVA tenderness, left CVA tenderness, guarding or rebound. Negative signs include Murphy's sign, Rovsing's sign, McBurney's sign, psoas sign and obturator sign.  Musculoskeletal:     Comments: Decreased ROM 2/2 pain with flexion/extension, lateral side bends, and rotation. Reproducible tenderness with deep palpation to right paraspinal lumbar muscles. No evidence of erythema, rash or ecchymosis.    Skin:    General: Skin is warm and dry.  Neurological:     Mental Status: She is alert.     GCS: GCS eye subscore is 4. GCS verbal subscore is 5. GCS motor subscore is 6.  Psychiatric:        Speech: Speech normal.        Behavior: Behavior normal. Behavior is cooperative.    Results for orders placed or performed in visit on 04/26/18  POCT urinalysis dipstick  Result Value Ref Range   Color, UA Yellow    Clarity, UA Clear    Glucose, UA Negative Negative   Bilirubin, UA Negative    Ketones, UA Negative    Spec Grav, UA 1.020 1.010 - 1.025   Blood, UA Negative    pH, UA 6.0 5.0 - 8.0   Protein, UA Negative Negative   Urobilinogen, UA 0.2 0.2 or 1.0 E.U./dL   Nitrite, UA Negative    Leukocytes, UA Negative Negative   Appearance     Odor      CLINICAL DATA:  Lumbago  EXAM: LUMBAR SPINE - COMPLETE 4+ VIEW  COMPARISON:   Lumbar MRI June 20, 2009  FINDINGS: Frontal, lateral, and bilateral oblique views were obtained. There are 5 non-rib-bearing lumbar type vertebral bodies. There is no fracture or spondylolisthesis. There is moderately severe disc space narrowing at L5-S1. Other disc spaces appear unremarkable. There are anterior osteophytes at all levels with small Schmorl's nodes at several levels. There Is facet osteoarthritic change to varying degrees at all levels bilaterally, most notably at L5-S1 bilaterally. There is aortic atherosclerosis.  IMPRESSION: Multilevel facet arthropathy. Disc space narrowing L5-S1. No fracture or spondylolisthesis. There is aortic atherosclerosis.  Aortic Atherosclerosis (ICD10-I70.0).   Electronically Signed   By: Lowella Grip III M.D.   On: 04/26/2018 15:29  CLINICAL DATA:  Abdominal pain  EXAM: ABDOMEN - 2 VIEW  COMPARISON:  None.  FINDINGS: Supine and upright images were obtained. There is diffuse stool throughout most of colon. There is no bowel dilatation or air-fluid level to suggest bowel obstruction. No free air. Pelvic calcifications likely represent phleboliths.  IMPRESSION: Diffuse stool throughout colon. Suspect a degree of constipation. No bowel obstruction or free air evident.  Electronically Signed   By: Lowella Grip III M.D.   On: 04/26/2018 15:30  Assessment and Plan:   Jull was seen today for back pain.  Diagnoses  and all orders for this visit:  Low back pain without sciatica, unspecified back pain laterality, unspecified chronicity Lumbar xray without acute issue. Stop flexeril as it is not helping her symptoms. Trial gabapentin. PT referral. Follow-up with Dr. Paulla Fore if symptoms are not improved. -     POCT urinalysis dipstick -     DG Lumbar Spine Complete; Future -     Ambulatory referral to Physical Therapy  Dysuria Urine without bacteria. She states that her symptoms are consistent with her  chronic issues. No treatment at this time. Follow-up with urology if worsens. -     POCT urinalysis dipstick  Lower abdominal pain Constipation. Will have patient start Miralax regimen. Follow-up if symptoms persist despite treatment. -     DG Abd 2 Views; Future  Other orders -     gabapentin (NEURONTIN) 100 MG capsule; Take 1 tablet nightly. May increase to max of 3 tablets nightly.  . Reviewed expectations re: course of current medical issues. . Discussed self-management of symptoms. . Outlined signs and symptoms indicating need for more acute intervention. . Patient verbalized understanding and all questions were answered. . See orders for this visit as documented in the electronic medical record. . Patient received an After-Visit Summary.  CMA or LPN served as scribe during this visit. History, Physical, and Plan performed by medical provider. The above documentation has been reviewed and is accurate and complete.  [name], PA-C

## 2018-04-27 DIAGNOSIS — H26492 Other secondary cataract, left eye: Secondary | ICD-10-CM | POA: Diagnosis not present

## 2018-04-28 ENCOUNTER — Encounter: Payer: Self-pay | Admitting: Physical Therapy

## 2018-04-28 ENCOUNTER — Ambulatory Visit: Payer: BLUE CROSS/BLUE SHIELD | Admitting: Physical Therapy

## 2018-04-28 DIAGNOSIS — M545 Low back pain, unspecified: Secondary | ICD-10-CM

## 2018-04-28 DIAGNOSIS — G8929 Other chronic pain: Secondary | ICD-10-CM | POA: Diagnosis not present

## 2018-04-28 NOTE — Patient Instructions (Signed)
Access Code: [email or web address]  URL: [email or web address]  Date: 04/28/2018  Prepared by: Lyndee Hensen   Exercises  Supine Single Knee to Chest - 3 reps - 30 hold - 3x daily  Supine Piriformis Stretch Pulling Heel to Hip - 3 reps - 30 hold - 3x daily  Seated Piriformis Stretch with Trunk Bend - 3 reps - 30 hold - 3x daily  Standing Sidebending with Chair Support - 3 reps - 30 hold - 3x daily

## 2018-04-28 NOTE — Therapy (Signed)
[facility] [address] [place], [place], [address] Phone: [phone]   Fax:  [phone]  Physical Therapy Evaluation  Patient Details  Name: Chelsey Jackson MRN: [id number] Date of Birth: March 15, 1951 Referring Provider (PT): [name]   Encounter Date: 04/28/2018  PT End of Session - 04/28/18 1410    Visit Number  1    Number of Visits  12    Date for PT Re-Evaluation  06/09/18    Authorization Type  BCBS    PT Start Time  1303    PT Stop Time  1339    PT Time Calculation (min)  36 min    Activity Tolerance  Patient tolerated treatment well    Behavior During Therapy  [facility] for tasks assessed/performed       Past Medical History:  Diagnosis Date  . 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 Allergy    weekly allergy shots  . Anxiety   . Asthma   . Depression   . Fibrocystic breast disease   . GERD (gastroesophageal reflux disease)   . OSA (obstructive sleep apnea) 07/31/2013  . Peptic ulcer disease   . TMJ locking     Past Surgical History:  Procedure Laterality Date  . ABDOMINAL HYSTERECTOMY  2003   due to fibroids  . ADENOIDECTOMY     x3  . BREAST CYST ASPIRATION    . COLONOSCOPY  03/10/2007   results-normal  . HAMMER TOE SURGERY    . PUBOVAGINAL SLING  '90's   for incontinence  . RHINOPLASTY  1984  . ROTATOR CUFF REPAIR Right 2013   [facility]  . ROTATOR CUFF REPAIR Left 2012   [facility]  . SHOULDER SURGERY    . TONSILLECTOMY     x2  . TUBAL LIGATION    . [place]  . WRIST SURGERY      There were no vitals filed for this visit.   Subjective Assessment - 04/28/18 1309    Subjective  Pt states increased pain in R side of low back. No incident to report, but she has been out of town for 2 weeks, with fathers death. sleeping in hotels and standing in hospital, had increased pain upon arriving home. She has had previous strain  of back, about 1 year ago, on L side, improved within a couple weeks.  Prior to family incident, she was working out, doing zumba 3 days/wk. She denies any numbness/tingling/ LE pain. She has had recent x-ray showing significant degeneration.     Limitations  Standing;Walking;Sitting;House hold activities    Patient Stated Goals  Decreased pain    Currently in Pain?  Yes    Pain Score  6     Pain Location  Back    Pain Orientation  Right    Pain Descriptors / Indicators  Aching;Stabbing    Pain Type  Acute pain    Pain Onset  1 to 4 weeks ago    Pain Frequency  Intermittent    Aggravating Factors   standing, initial standing, transfers    Pain Relieving Factors  Weight bearing on L (seated) , unweighting R side          [facility] PT Assessment - 04/28/18 0001      Assessment   Medical Diagnosis  Back Pain    Referring Provider (PT)  [name]    Onset Date/Surgical Date  04/20/18  Prior Therapy  For L sided pain      Precautions   Precautions  None      Balance Screen   Has the patient fallen in the past 6 months  No      Prior Function   Level of Independence  Independent      Cognition   Overall Cognitive Status  Within Functional Limits for tasks assessed      ROM / Strength   AROM / PROM / Strength  AROM;Strength      AROM   Overall AROM Comments  Mild loss of lumbar flexion , ext, and SB (R) due to pain; Hips: WNL      Strength   Overall Strength Comments  Hips: 4/5;  Core: 3/5      Palpation   Palpation comment  Pain in R SI, R L3-L5 region, R paraspinals, QL, and into R glute med, and piriformis.       Special Tests   Other special tests  Neg SLR, Neg faber,                 Objective measurements completed on examination: See above findings.      [facility] Adult PT Treatment/Exercise - 04/28/18 0001      Exercises   Exercises  Lumbar      Lumbar Exercises: Stretches   Active Hamstring Stretch  3 reps;30 seconds    Single Knee to Chest  Stretch  3 reps;30 seconds    Piriformis Stretch  3 reps;30 seconds    Piriformis Stretch Limitations  seated    Figure 4 Stretch  3 reps;30 seconds    Figure 4 Stretch Limitations  supine    Other Lumbar Stretch Exercise  Standing R QL stretch 30 sec x2;              PT Education - 04/28/18 1409    Education Details  PT POC, HEP    Person(s) Educated  Patient    Methods  Explanation;Handout;Demonstration    Comprehension  Verbalized understanding;Returned demonstration;Need further instruction       PT Short Term Goals - 04/28/18 1316      PT SHORT TERM GOAL #1   Title  Pt to be independent with initial HEP     Time  2    Period  Weeks    Status  New    Target Date  05/12/18      PT SHORT TERM GOAL #2   Title  Pt to report decreased pain to 3/10 with transfers and activity     Time  2    Period  Weeks    Status  New    Target Date  05/12/18        PT Long Term Goals - 04/28/18 1353      PT LONG TERM GOAL #1   Title  Pt to report decreased pain to 0-2/10 with transfers, and standing activity     Time  6    Period  Weeks    Status  New    Target Date  06/09/18      PT LONG TERM GOAL #2   Title  Pt to demo improved lumbar ROM to be WNL and pain free, to improve ability for IADLs     Time  6    Period  Weeks    Status  New    Target Date  06/09/18      PT LONG TERM GOAL #3  Title  Pt to demo soft tissue restrictions in lumbar/glute region to be [facility], to decrease pain.     Time  6    Period  Weeks    Status  New    Target Date  06/09/18             Plan - 04/28/18 1410    Clinical Impression Statement  Pt presents with primary complaint of increased pain in R side of low back, and into glute. She has soreness with palpation of R SI, R low lumbar, and into surrounding musculature. Pt with mild ROM loss, due to pain, escpecially for flexion and R SB. Pt with decreased ability for full functional activities, bending, transfers, IADLs and exercise,  due to pain. Pt with good response to PT with previous bout of back pain on L. Pt to benefit from skilled PT to improve pain and return to PLOF.     Clinical Presentation  Stable    Clinical Decision Making  Low    Rehab Potential  Good    PT Frequency  2x / week    PT Duration  6 weeks    PT Treatment/Interventions  ADLs/Self Care Home Management;Cryotherapy;Electrical Stimulation;Iontophoresis 4mg /ml Dexamethasone;Functional mobility training;Stair training;Gait training;Ultrasound;Traction;Moist Heat;Therapeutic activities;Therapeutic exercise;Neuromuscular re-education;Cognitive remediation;Patient/family education;Passive range of motion;Manual techniques;Dry needling;Taping    Consulted and Agree with Plan of Care  Patient       Patient will benefit from skilled therapeutic intervention in order to improve the following deficits and impairments:  Decreased range of motion, Difficulty walking, Increased muscle spasms, Decreased endurance, Decreased activity tolerance, Pain, Impaired flexibility, Improper body mechanics, Decreased mobility, Decreased strength  Visit Diagnosis: Chronic right-sided low back pain without sciatica     Problem List Patient Active Problem List   Diagnosis Date Noted  . History of retinal tear 10/20/2016  . Low back pain 09/28/2015  . Insomnia 08/18/2013  . OSA (obstructive sleep apnea) 07/31/2013  . Thyroid nodule, cold 07/15/2012  . Anemia 01/10/2010  . Anxiety and depression 10/05/2007  . Allergic rhinitis due to pollen 10/05/2007  . Asthma 10/05/2007    Lyndee Hensen, PT, DPT 2:59 PM  04/28/18    [facility] [place], [place], [address] Phone: [phone]   Fax:  [phone]  Name: RONYA GILCREST MRN: [id number] Date of Birth: January 12, 1951

## 2018-05-03 ENCOUNTER — Encounter: Payer: Self-pay | Admitting: Physical Therapy

## 2018-05-03 ENCOUNTER — Ambulatory Visit: Payer: BLUE CROSS/BLUE SHIELD | Admitting: Physical Therapy

## 2018-05-03 DIAGNOSIS — J301 Allergic rhinitis due to pollen: Secondary | ICD-10-CM | POA: Diagnosis not present

## 2018-05-03 DIAGNOSIS — J3089 Other allergic rhinitis: Secondary | ICD-10-CM | POA: Diagnosis not present

## 2018-05-03 DIAGNOSIS — M545 Low back pain, unspecified: Secondary | ICD-10-CM

## 2018-05-03 DIAGNOSIS — G8929 Other chronic pain: Secondary | ICD-10-CM

## 2018-05-03 NOTE — Therapy (Signed)
[facility] [address] [place], [place], [address] Phone: [phone]   Fax:  [phone]  Physical Therapy Treatment  Patient Details  Name: Chelsey Jackson MRN: [id number] Date of Birth: 1950/09/18 Referring Provider (PT): [name]   Encounter Date: 05/03/2018  PT End of Session - 05/03/18 0820    Visit Number  2    Number of Visits  12    Date for PT Re-Evaluation  06/09/18    Authorization Type  BCBS    PT Start Time  0805    PT Stop Time  0845    PT Time Calculation (min)  40 min    Activity Tolerance  Patient tolerated treatment well    Behavior During Therapy  [facility] for tasks assessed/performed       Past Medical History:  Diagnosis Date  . 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 Allergy    weekly allergy shots  . Anxiety   . Asthma   . Depression   . Fibrocystic breast disease   . GERD (gastroesophageal reflux disease)   . OSA (obstructive sleep apnea) 07/31/2013  . Peptic ulcer disease   . TMJ locking     Past Surgical History:  Procedure Laterality Date  . ABDOMINAL HYSTERECTOMY  2003   due to fibroids  . ADENOIDECTOMY     x3  . BREAST CYST ASPIRATION    . COLONOSCOPY  03/10/2007   results-normal  . HAMMER TOE SURGERY    . PUBOVAGINAL SLING  '90's   for incontinence  . RHINOPLASTY  1984  . ROTATOR CUFF REPAIR Right 2013   [facility]  . ROTATOR CUFF REPAIR Left 2012   [facility]  . SHOULDER SURGERY    . TONSILLECTOMY     x2  . TUBAL LIGATION    . [place]  . WRIST SURGERY      There were no vitals filed for this visit.  Subjective Assessment - 05/03/18 0812    Subjective  Pt states minimal change in back pain, states pain is not "stabbing" anymore, but is "there"  She was able to go for  a walk yesterday, without pain.     Currently in Pain?  Yes    Pain Location  Back    Pain Orientation  Right    Pain  Descriptors / Indicators  Aching    Pain Type  Acute pain    Pain Onset  More than a month ago    Pain Frequency  Intermittent                       [facility] Adult PT Treatment/Exercise - 05/03/18 0816      Exercises   Exercises  Lumbar      Lumbar Exercises: Stretches   Active Hamstring Stretch  --    Single Knee to Chest Stretch  3 reps;30 seconds    Lower Trunk Rotation  5 reps;10 seconds    Piriformis Stretch  --    Piriformis Stretch Limitations  --    Figure 4 Stretch  3 reps;30 seconds    Figure 4 Stretch Limitations  supine    Other Lumbar Stretch Exercise  Standing R QL stretch 30 sec x2;       Lumbar Exercises: Aerobic   Stationary Bike  L1 x 8 min      Lumbar Exercises: Standing   Other Standing Lumbar  Exercises  Side glides to L x15;       Manual Therapy   Manual Therapy  Joint mobilization;Soft tissue mobilization    Manual therapy comments  skilled palpation and monitoring of soft tissue during dry needling.     Joint Mobilization  Lumbar PA mobs gr 3;  SI mobs    Soft tissue mobilization  DTM to R low lumbar region, R glute med and lateral hip rotators       Trigger Point Dry Needling - 05/03/18 0926    Consent Given?  Yes    Education Handout Provided  Yes    Muscles Treated Lower Body  Gluteus minimus   R deep hip rotators   Gluteus Minimus Response  Palpable increased muscle length           PT Education - 05/03/18 0929    Education Details  Pt educated on benefits, risks, use, and precautions for dry needling.     Person(s) Educated  Patient    Methods  Explanation    Comprehension  Verbalized understanding       PT Short Term Goals - 04/28/18 1316      PT SHORT TERM GOAL #1   Title  Pt to be independent with initial HEP     Time  2    Period  Weeks    Status  New    Target Date  05/12/18      PT SHORT TERM GOAL #2   Title  Pt to report decreased pain to 3/10 with transfers and activity     Time  2    Period  Weeks     Status  New    Target Date  05/12/18        PT Long Term Goals - 04/28/18 1353      PT LONG TERM GOAL #1   Title  Pt to report decreased pain to 0-2/10 with transfers, and standing activity     Time  6    Period  Weeks    Status  New    Target Date  06/09/18      PT LONG TERM GOAL #2   Title  Pt to demo improved lumbar ROM to be WNL and pain free, to improve ability for IADLs     Time  6    Period  Weeks    Status  New    Target Date  06/09/18      PT LONG TERM GOAL #3   Title  Pt to demo soft tissue restrictions in lumbar/glute region to be [facility], to decrease pain.     Time  6    Period  Weeks    Status  New    Target Date  06/09/18            Plan - 05/03/18 0924    Clinical Impression Statement  Pt with good response to dry needling for R glute today. Pt with soreness with deep palaption at R low lumbar region and Bil SI, R>L. Plan to progress as tolerated. Pt may benefit from furture dry needling for glute and/or multifidus.     Rehab Potential  Good    PT Frequency  2x / week    PT Duration  6 weeks    PT Treatment/Interventions  ADLs/Self Care Home Management;Cryotherapy;Electrical Stimulation;Iontophoresis 4mg /ml Dexamethasone;Functional mobility training;Stair training;Gait training;Ultrasound;Traction;Moist Heat;Therapeutic activities;Therapeutic exercise;Neuromuscular re-education;Cognitive remediation;Patient/family education;Passive range of motion;Manual techniques;Dry needling;Taping    Consulted and Agree with Plan of Care  Patient       Patient will benefit from skilled therapeutic intervention in order to improve the following deficits and impairments:  Decreased range of motion, Difficulty walking, Increased muscle spasms, Decreased endurance, Decreased activity tolerance, Pain, Impaired flexibility, Improper body mechanics, Decreased mobility, Decreased strength  Visit Diagnosis: Chronic right-sided low back pain without sciatica     Problem  List Patient Active Problem List   Diagnosis Date Noted  . History of retinal tear 10/20/2016  . Low back pain 09/28/2015  . Insomnia 08/18/2013  . OSA (obstructive sleep apnea) 07/31/2013  . Thyroid nodule, cold 07/15/2012  . Anemia 01/10/2010  . Anxiety and depression 10/05/2007  . Allergic rhinitis due to pollen 10/05/2007  . Asthma 10/05/2007    Lyndee Hensen, PT, DPT 9:30 AM  05/03/18    [facility] [place], [place], [address] Phone: [phone]   Fax:  [phone]  Name: DANNETTE KINKAID MRN: [id number] Date of Birth: 01-11-1951

## 2018-05-04 ENCOUNTER — Ambulatory Visit: Payer: Self-pay | Admitting: *Deleted

## 2018-05-04 DIAGNOSIS — F411 Generalized anxiety disorder: Secondary | ICD-10-CM | POA: Diagnosis not present

## 2018-05-04 NOTE — Telephone Encounter (Signed)
Called patient gave all information. She is not able to stand taste of Prune juice but will try all other option. She will call us Friday and let us know. Pt does not have pain just full feeling. She was informed that if she does start to have pain that she needs to be seen. If our office is not open she may need to go to ED. Repeated back to me will call If any questions.

## 2018-05-04 NOTE — Telephone Encounter (Signed)
Please advise 

## 2018-05-04 NOTE — Telephone Encounter (Signed)
Okay enema, prune juice, miralax BID. Okay Milk of Mag if not working. If pain, needs to be seen.

## 2018-05-04 NOTE — Telephone Encounter (Signed)
Summary: constipation x 1 wk   Pt states she has tried miralax and ducolax and nothing is getting things moving. She thinks it is about a week since she has had a bowel movement. Pt is asking for advice to be able to go.     Patient would like to know what to do- she has used miralax and Doculax for constipation. She feels rectal pressure and passes small little pellets. Patient wants to know if it is safe to use enema at this time- warned about rectal fissures and counciled on impaction and manual removal of stool. Told patient would send call for recommendation on enema. Reason for Disposition . [1] Rectal pain or fullness from fecal impaction (rectum full of stool) AND [2] has not tried SITZ bath, suppository or enema  Answer Assessment - Initial Assessment Questions 1. STOOL PATTERN OR FREQUENCY: "How often do you pass bowel movements (BMs)?"  (Normal range: tid to q 3 days)  "When was the last BM passed?"       Every day- once daily, last BM- patient can not remember 2. STRAINING: "Do you have to strain to have a BM?"      Patient has been straining  3. RECTAL PAIN: "Does your rectum hurt when the stool comes out?" If so, ask: "Do you have hemorrhoids? How bad is the pain?"  (Scale 1-10; or mild, moderate, severe)     No, no hemorroids 4. STOOL COMPOSITION: "Are the stools hard?"      Small tiny balls 5. BLOOD ON STOOLS: "Has there been any blood on the toilet tissue or on the surface of the BM?" If so, ask: "When was the last time?"      no 6. CHRONIC CONSTIPATION: "Is this a new problem for you?"  If no, ask: "How long have you had this problem?" (days, weeks, months)      Yes- at least 1 week 7. CHANGES IN DIET: "Have there been any recent changes in your diet?"      Yes- patient has been out of town in 12/25- back to normal since beginning of the year 8. MEDICATIONS: "Have you been taking any new medications?"     no 9. LAXATIVES: "Have you been using any laxatives or enemas?"  If  yes, ask "What, how often, and when was the last time?"     Patient took Miralax- nightly 4 nights- ducalax- increasing doses, increasing fluids 10. CAUSE: "What do you think is causing the constipation?"        unknown 11. OTHER SYMPTOMS: "Do you have any other symptoms?" (e.g., abdominal pain, fever, vomiting)       no 12. PREGNANCY: "Is there any chance you are pregnant?" "When was your last menstrual period?"       n/a  Protocols used: CONSTIPATION-A-AH

## 2018-05-04 NOTE — Telephone Encounter (Signed)
See note

## 2018-05-05 ENCOUNTER — Telehealth: Payer: Self-pay | Admitting: Family Medicine

## 2018-05-05 ENCOUNTER — Encounter: Payer: Self-pay | Admitting: Physical Therapy

## 2018-05-05 ENCOUNTER — Ambulatory Visit: Payer: BLUE CROSS/BLUE SHIELD | Admitting: Physical Therapy

## 2018-05-05 ENCOUNTER — Encounter: Payer: BLUE CROSS/BLUE SHIELD | Admitting: Physical Therapy

## 2018-05-05 DIAGNOSIS — M545 Low back pain, unspecified: Secondary | ICD-10-CM

## 2018-05-05 DIAGNOSIS — G8929 Other chronic pain: Secondary | ICD-10-CM | POA: Diagnosis not present

## 2018-05-05 NOTE — Telephone Encounter (Signed)
Patient just wanted to let Dr Juleen China she has 2 bowel movements.

## 2018-05-05 NOTE — Telephone Encounter (Signed)
FYI

## 2018-05-05 NOTE — Therapy (Signed)
[facility] [address] [place], [place], [address] Phone: [phone]   Fax:  [phone]  Physical Therapy Treatment  Patient Details  Name: Chelsey Jackson MRN: [id number] Date of Birth: [birth year] Referring Provider (PT): [name]   Encounter Date: 05/05/2018  PT End of Session - 05/05/18 0944    Visit Number  3    Number of Visits  12    Date for PT Re-Evaluation  06/09/18    Authorization Type  BCBS    PT Start Time  0802    PT Stop Time  0845    PT Time Calculation (min)  43 min    Activity Tolerance  Patient tolerated treatment well    Behavior During Therapy  [facility] for tasks assessed/performed       Past Medical History:  Diagnosis Date  . 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 Allergy    weekly allergy shots  . Anxiety   . Asthma   . Depression   . Fibrocystic breast disease   . GERD (gastroesophageal reflux disease)   . OSA (obstructive sleep apnea) 07/31/2013  . Peptic ulcer disease   . TMJ locking     Past Surgical History:  Procedure Laterality Date  . ABDOMINAL HYSTERECTOMY  2003   due to fibroids  . ADENOIDECTOMY     x3  . BREAST CYST ASPIRATION    . COLONOSCOPY  03/10/2007   results-normal  . HAMMER TOE SURGERY    . PUBOVAGINAL SLING  '90's   for incontinence  . RHINOPLASTY  1984  . ROTATOR CUFF REPAIR Right 2013   [facility]  . ROTATOR CUFF REPAIR Left 2012   [facility]  . SHOULDER SURGERY    . TONSILLECTOMY     x2  . TUBAL LIGATION    . [place]  . WRIST SURGERY      There were no vitals filed for this visit.  Subjective Assessment - 05/05/18 0852    Subjective  Pt states improving pain. She "feels it" but is not as painful as previously    Currently in Pain?  Yes    Pain Score  3     Pain Location  Back    Pain Orientation  Right    Pain Descriptors / Indicators  Aching    Pain Type  Acute pain    Pain Onset  More than a month ago    Pain Frequency  Intermittent                       [facility] Adult PT Treatment/Exercise - 05/05/18 0812      Exercises   Exercises  Lumbar      Lumbar Exercises: Stretches   Single Knee to Chest Stretch  3 reps;30 seconds    Lower Trunk Rotation  --    Figure 4 Stretch  3 reps;30 seconds    Figure 4 Stretch Limitations  supine    Other Lumbar Stretch Exercise  Standing R QL stretch 30 sec x2;       Lumbar Exercises: Aerobic   Stationary Bike  L1 x 8 min      Lumbar Exercises: Standing   Other Standing Lumbar Exercises  Side glides x20 bil      Lumbar Exercises: Supine   Clam  20 reps    Bent Knee Raise  20 reps    Bridge  20 reps      Manual Therapy   Manual Therapy  Joint mobilization;Soft tissue mobilization    Manual therapy comments  skilled palpation and monitoring of soft tissue during dry needling.     Joint Mobilization  --    Soft tissue mobilization  DTM to R low lumbar region, R glute med and lateral hip rotators       Trigger Point Dry Needling - 05/05/18 0946    Consent Given?  Yes    Muscles Treated Lower Body  Gluteus minimus   Deep hip rotators on R;    Gluteus Minimus Response  Palpable increased muscle length   R            PT Short Term Goals - 04/28/18 1316      PT SHORT TERM GOAL #1   Title  Pt to be independent with initial HEP     Time  2    Period  Weeks    Status  New    Target Date  05/12/18      PT SHORT TERM GOAL #2   Title  Pt to report decreased pain to 3/10 with transfers and activity     Time  2    Period  Weeks    Status  New    Target Date  05/12/18        PT Long Term Goals - 04/28/18 1353      PT LONG TERM GOAL #1   Title  Pt to report decreased pain to 0-2/10 with transfers, and standing activity     Time  6    Period  Weeks    Status  New    Target Date  06/09/18      PT LONG TERM GOAL #2   Title  Pt to demo improved lumbar ROM to be WNL and pain free,  to improve ability for IADLs     Time  6    Period  Weeks    Status  New    Target Date  06/09/18      PT LONG TERM GOAL #3   Title  Pt to demo soft tissue restrictions in lumbar/glute region to be [facility], to decrease pain.     Time  6    Period  Weeks    Status  New    Target Date  06/09/18            Plan - 05/05/18 1126    Clinical Impression Statement  Pt with continued soreness in R glute and R SI today, addressed with manual therapy and dry needling. Pt with improving pain with movement, flexion and SB from previous sessions. Started light core strengthening today. Pt to benefit from continued care.     Rehab Potential  Good    PT Frequency  2x / week    PT Duration  6 weeks    PT Treatment/Interventions  ADLs/Self Care Home Management;Cryotherapy;Electrical Stimulation;Iontophoresis 4mg /ml Dexamethasone;Functional mobility training;Stair training;Gait training;Ultrasound;Traction;Moist Heat;Therapeutic activities;Therapeutic exercise;Neuromuscular re-education;Cognitive remediation;Patient/family education;Passive range of motion;Manual techniques;Dry needling;Taping    Consulted and Agree with Plan of Care  Patient       Patient will benefit from skilled therapeutic intervention in order to improve the following deficits and impairments:  Decreased range of motion, Difficulty walking, Increased muscle spasms, Decreased endurance, Decreased activity tolerance, Pain, Impaired flexibility, Improper body mechanics, Decreased mobility, Decreased strength  Visit Diagnosis: Chronic right-sided low back pain without sciatica     Problem List  Patient Active Problem List   Diagnosis Date Noted  . History of retinal tear 10/20/2016  . Low back pain 09/28/2015  . Insomnia 08/18/2013  . OSA (obstructive sleep apnea) 07/31/2013  . Thyroid nodule, cold 07/15/2012  . Anemia 01/10/2010  . Anxiety and depression 10/05/2007  . Allergic rhinitis due to pollen 10/05/2007  . Asthma  10/05/2007  Chelsey Jackson, PT, DPT 11:28 AM  05/05/18    [facility] [place], [place], [address] Phone: [phone]   Fax:  [phone]  Name: Chelsey Jackson MRN: [id number] Date of Birth: 1950/10/12

## 2018-05-07 DIAGNOSIS — F332 Major depressive disorder, recurrent severe without psychotic features: Secondary | ICD-10-CM | POA: Diagnosis not present

## 2018-05-10 ENCOUNTER — Encounter: Payer: Self-pay | Admitting: Physical Therapy

## 2018-05-10 ENCOUNTER — Ambulatory Visit: Payer: BLUE CROSS/BLUE SHIELD | Admitting: Physical Therapy

## 2018-05-10 DIAGNOSIS — M545 Low back pain, unspecified: Secondary | ICD-10-CM

## 2018-05-10 DIAGNOSIS — G8929 Other chronic pain: Secondary | ICD-10-CM | POA: Diagnosis not present

## 2018-05-10 DIAGNOSIS — J3089 Other allergic rhinitis: Secondary | ICD-10-CM | POA: Diagnosis not present

## 2018-05-10 DIAGNOSIS — J301 Allergic rhinitis due to pollen: Secondary | ICD-10-CM | POA: Diagnosis not present

## 2018-05-10 NOTE — Therapy (Signed)
[facility] [address] [place], [place], [address] Phone: [phone]   Fax:  [phone]  Physical Therapy Treatment  Patient Details  Name: Chelsey Jackson MRN: [id number] Date of Birth: May 08, 1950 Referring Provider (PT): [name]   Encounter Date: 05/10/2018  PT End of Session - 05/10/18 2156    Visit Number  4    Number of Visits  12    Date for PT Re-Evaluation  06/09/18    Authorization Type  BCBS    PT Start Time  0805    PT Stop Time  0847    PT Time Calculation (min)  42 min    Activity Tolerance  Patient tolerated treatment well    Behavior During Therapy  [facility] for tasks assessed/performed       Past Medical History:  Diagnosis Date  . 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 Allergy    weekly allergy shots  . Anxiety   . Asthma   . Depression   . Fibrocystic breast disease   . GERD (gastroesophageal reflux disease)   . OSA (obstructive sleep apnea) 07/31/2013  . Peptic ulcer disease   . TMJ locking     Past Surgical History:  Procedure Laterality Date  . ABDOMINAL HYSTERECTOMY  2003   due to fibroids  . ADENOIDECTOMY     x3  . BREAST CYST ASPIRATION    . COLONOSCOPY  03/10/2007   results-normal  . HAMMER TOE SURGERY    . PUBOVAGINAL SLING  '90's   for incontinence  . RHINOPLASTY  1984  . ROTATOR CUFF REPAIR Right 2013   [place]  . ROTATOR CUFF REPAIR Left 2012   [place]  . SHOULDER SURGERY    . TONSILLECTOMY     x2  . TUBAL LIGATION    . [place]  . WRIST SURGERY      There were no vitals filed for this visit.  Subjective Assessment - 05/10/18 2155    Subjective  Pt states little pain with activity. She has had most pain when riding in car this weekend.     Currently in Pain?  Yes    Pain Score  2     Pain Location  Back    Pain Orientation  Right    Pain Descriptors / Indicators  Aching    Pain Type   Acute pain                       [facility] Adult PT Treatment/Exercise - 05/10/18 0814      Exercises   Exercises  Lumbar      Lumbar Exercises: Stretches   Single Knee to Chest Stretch  3 reps;30 seconds    Lower Trunk Rotation  5 reps;10 seconds    Figure 4 Stretch  3 reps;30 seconds    Figure 4 Stretch Limitations  seated    Other Lumbar Stretch Exercise  Standing R QL stretch 30 sec x2;       Lumbar Exercises: Aerobic   Stationary Bike  L1 x 8 min      Lumbar Exercises: Standing   Other Standing Lumbar Exercises  Side glides x20 bil    Other Standing Lumbar Exercises  Standing hip abd 2x10 bil;       Lumbar Exercises: Supine   Clam  20 reps    Clam Limitations  GTB    Bent  Knee Raise  --    Bridge  20 reps    Straight Leg Raise  20 reps    Straight Leg Raises Limitations  both      Manual Therapy   Manual Therapy  Joint mobilization;Soft tissue mobilization    Manual therapy comments  --    Soft tissue mobilization  DTM to R low lumbar region, R glute med and lateral hip rotators               PT Short Term Goals - 04/28/18 1316      PT SHORT TERM GOAL #1   Title  Pt to be independent with initial HEP     Time  2    Period  Weeks    Status  New    Target Date  05/12/18      PT SHORT TERM GOAL #2   Title  Pt to report decreased pain to 3/10 with transfers and activity     Time  2    Period  Weeks    Status  New    Target Date  05/12/18        PT Long Term Goals - 04/28/18 1353      PT LONG TERM GOAL #1   Title  Pt to report decreased pain to 0-2/10 with transfers, and standing activity     Time  6    Period  Weeks    Status  New    Target Date  06/09/18      PT LONG TERM GOAL #2   Title  Pt to demo improved lumbar ROM to be WNL and pain free, to improve ability for IADLs     Time  6    Period  Weeks    Status  New    Target Date  06/09/18      PT LONG TERM GOAL #3   Title  Pt to demo soft tissue restrictions in  lumbar/glute region to be [facility], to decrease pain.     Time  6    Period  Weeks    Status  New    Target Date  06/09/18            Plan - 05/10/18 2200    Clinical Impression Statement  Pt wih decreasing pain with activity. She does have soreness with palpation of R glute and SI region, with STM today, and mild pain with R SB ROM. Discussed proper seated posture and lumbar support for riding in car. Plan to progress as tolerated.     Rehab Potential  Good    PT Frequency  2x / week    PT Duration  6 weeks    PT Treatment/Interventions  ADLs/Self Care Home Management;Cryotherapy;Electrical Stimulation;Iontophoresis 4mg /ml Dexamethasone;Functional mobility training;Stair training;Gait training;Ultrasound;Traction;Moist Heat;Therapeutic activities;Therapeutic exercise;Neuromuscular re-education;Cognitive remediation;Patient/family education;Passive range of motion;Manual techniques;Dry needling;Taping    Consulted and Agree with Plan of Care  Patient       Patient will benefit from skilled therapeutic intervention in order to improve the following deficits and impairments:  Decreased range of motion, Difficulty walking, Increased muscle spasms, Decreased endurance, Decreased activity tolerance, Pain, Impaired flexibility, Improper body mechanics, Decreased mobility, Decreased strength  Visit Diagnosis: Chronic right-sided low back pain without sciatica     Problem List Patient Active Problem List   Diagnosis Date Noted  . History of retinal tear 10/20/2016  . Low back pain 09/28/2015  . Insomnia 08/18/2013  . OSA (obstructive sleep apnea) 07/31/2013  .  Thyroid nodule, cold 07/15/2012  . Anemia 01/10/2010  . Anxiety and depression 10/05/2007  . Allergic rhinitis due to pollen 10/05/2007  . Asthma 10/05/2007   Lyndee Hensen, PT, DPT 10:07 PM  05/10/18    [facility] [place], [place], [address] Phone: [phone]    Fax:  [phone]  Name: Chelsey Jackson MRN: [id number] Date of Birth: 1950/12/14

## 2018-05-12 ENCOUNTER — Encounter: Payer: Self-pay | Admitting: Physical Therapy

## 2018-05-12 ENCOUNTER — Ambulatory Visit: Payer: BLUE CROSS/BLUE SHIELD | Admitting: Physical Therapy

## 2018-05-12 DIAGNOSIS — G8929 Other chronic pain: Secondary | ICD-10-CM | POA: Diagnosis not present

## 2018-05-12 DIAGNOSIS — M545 Low back pain, unspecified: Secondary | ICD-10-CM

## 2018-05-12 NOTE — Therapy (Signed)
[facility] [address] [place], [place], [address] Phone: [phone]   Fax:  [phone]  Physical Therapy Treatment  Patient Details  Name: Chelsey Jackson MRN: [id number] Date of Birth: September 28, 1950 Referring Provider (PT): [name]   Encounter Date: 05/12/2018  PT End of Session - 05/12/18 0810    Visit Number  5    Number of Visits  12    Date for PT Re-Evaluation  06/09/18    Authorization Type  BCBS    PT Start Time  0802    PT Stop Time  0844    PT Time Calculation (min)  42 min    Activity Tolerance  Patient tolerated treatment well    Behavior During Therapy  [facility] for tasks assessed/performed       Past Medical History:  Diagnosis Date  . 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 Allergy    weekly allergy shots  . Anxiety   . Asthma   . Depression   . Fibrocystic breast disease   . GERD (gastroesophageal reflux disease)   . OSA (obstructive sleep apnea) 07/31/2013  . Peptic ulcer disease   . TMJ locking     Past Surgical History:  Procedure Laterality Date  . ABDOMINAL HYSTERECTOMY  2003   due to fibroids  . ADENOIDECTOMY     x3  . BREAST CYST ASPIRATION    . COLONOSCOPY  03/10/2007   results-normal  . HAMMER TOE SURGERY    . PUBOVAGINAL SLING  '90's   for incontinence  . RHINOPLASTY  1984  . ROTATOR CUFF REPAIR Right 2013   [place]  . ROTATOR CUFF REPAIR Left 2012   [place]  . SHOULDER SURGERY    . TONSILLECTOMY     x2  . TUBAL LIGATION    . [place]  . WRIST SURGERY      There were no vitals filed for this visit.  Subjective Assessment - 05/12/18 0809    Subjective  Pt states less pain. She did obtain lumbar roll.     Currently in Pain?  Yes    Pain Score  3     Pain Location  Back    Pain Orientation  Right    Pain Descriptors / Indicators  Aching    Pain Type  Acute pain    Pain Onset  More than a  month ago    Pain Frequency  Intermittent                       [facility] Adult PT Treatment/Exercise - 05/12/18 0812      Exercises   Exercises  Lumbar      Lumbar Exercises: Stretches   Single Knee to Chest Stretch  3 reps;30 seconds    Lower Trunk Rotation  --    Figure 4 Stretch  3 reps;30 seconds    Figure 4 Stretch Limitations  seated    Other Lumbar Stretch Exercise  --      Lumbar Exercises: Aerobic   Stationary Bike  L1 x 8 min      Lumbar Exercises: Standing   Other Standing Lumbar Exercises  Side glides x20 bil    Other Standing Lumbar Exercises  Standing hip abd 2x10 bil; hip ext 2x10 bil;       Lumbar Exercises: Supine   Clam  20 reps    Clam Limitations  GTB    Bent Knee Raise  20 reps    Bridge  --    Straight Leg Raise  20 reps    Straight Leg Raises Limitations  both      Lumbar Exercises: Sidelying   Clam  15 reps;Both      Manual Therapy   Manual Therapy  Joint mobilization;Soft tissue mobilization;Manual Traction;Passive ROM    Joint Mobilization  Posterior hip mobs gr 3;     Soft tissue mobilization  MET for R SI in s/l ;      Passive ROM  Passive stretching for R hip, all motions;     Manual Traction  Long leg distraction on R; 10 sec x5;                PT Short Term Goals - 05/12/18 5462      PT SHORT TERM GOAL #1   Title  Pt to be independent with initial HEP     Time  2    Period  Weeks    Status  Achieved      PT SHORT TERM GOAL #2   Title  Pt to report decreased pain to 3/10 with transfers and activity     Time  2    Period  Weeks    Status  Achieved        PT Long Term Goals - 04/28/18 1353      PT LONG TERM GOAL #1   Title  Pt to report decreased pain to 0-2/10 with transfers, and standing activity     Time  6    Period  Weeks    Status  New    Target Date  06/09/18      PT LONG TERM GOAL #2   Title  Pt to demo improved lumbar ROM to be WNL and pain free, to improve ability for IADLs     Time  6     Period  Weeks    Status  New    Target Date  06/09/18      PT LONG TERM GOAL #3   Title  Pt to demo soft tissue restrictions in lumbar/glute region to be [facility], to decrease pain.     Time  6    Period  Weeks    Status  New    Target Date  06/09/18            Plan - 05/12/18 0931    Clinical Impression Statement  Pt with improved ability for lumbar ROM and SB today, has "Pressure" but states minimal pain. She was able to progress ther ex without increased pain. Plan to progress as tolerated and continue hip/core strengthening.     Rehab Potential  Good    PT Frequency  2x / week    PT Duration  6 weeks    PT Treatment/Interventions  ADLs/Self Care Home Management;Cryotherapy;Electrical Stimulation;Iontophoresis 66m/ml Dexamethasone;Functional mobility training;Stair training;Gait training;Ultrasound;Traction;Moist Heat;Therapeutic activities;Therapeutic exercise;Neuromuscular re-education;Cognitive remediation;Patient/family education;Passive range of motion;Manual techniques;Dry needling;Taping    Consulted and Agree with Plan of Care  Patient       Patient will benefit from skilled therapeutic intervention in order to improve the following deficits and impairments:  Decreased range of motion, Difficulty walking, Increased muscle spasms, Decreased endurance, Decreased activity tolerance, Pain, Impaired flexibility, Improper body mechanics, Decreased mobility, Decreased strength  Visit Diagnosis: Chronic right-sided low back pain without sciatica     Problem List Patient Active Problem List   Diagnosis Date  Noted  . History of retinal tear 10/20/2016  . Low back pain 09/28/2015  . Insomnia 08/18/2013  . OSA (obstructive sleep apnea) 07/31/2013  . Thyroid nodule, cold 07/15/2012  . Anemia 01/10/2010  . Anxiety and depression 10/05/2007  . Allergic rhinitis due to pollen 10/05/2007  . Asthma 10/05/2007   Lyndee Hensen, PT, DPT 9:32 AM  05/12/18    [facility]  [place], [place], [address] Phone: [phone]   Fax:  [phone]  Name: GIRTHA KILGORE MRN: [id number] Date of Birth: 1950/11/08

## 2018-05-13 DIAGNOSIS — J301 Allergic rhinitis due to pollen: Secondary | ICD-10-CM | POA: Diagnosis not present

## 2018-05-13 DIAGNOSIS — J3089 Other allergic rhinitis: Secondary | ICD-10-CM | POA: Diagnosis not present

## 2018-05-13 DIAGNOSIS — F411 Generalized anxiety disorder: Secondary | ICD-10-CM | POA: Diagnosis not present

## 2018-05-18 ENCOUNTER — Encounter: Payer: Self-pay | Admitting: Physical Therapy

## 2018-05-18 ENCOUNTER — Ambulatory Visit: Payer: BLUE CROSS/BLUE SHIELD | Admitting: Physical Therapy

## 2018-05-18 DIAGNOSIS — G8929 Other chronic pain: Secondary | ICD-10-CM

## 2018-05-18 DIAGNOSIS — M545 Low back pain, unspecified: Secondary | ICD-10-CM

## 2018-05-18 DIAGNOSIS — J301 Allergic rhinitis due to pollen: Secondary | ICD-10-CM | POA: Diagnosis not present

## 2018-05-18 NOTE — Therapy (Addendum)
[facility] [address] [place], [place], [address] Phone: [phone]   Fax:  [phone]  Physical Therapy Treatment  Patient Details  Name: Chelsey Jackson MRN: [id number] Date of Birth: Oct 23, 1950 Referring Provider (PT): [name]   Encounter Date: 05/18/2018  PT End of Session - 05/18/18 0812    Visit Number  6    Number of Visits  12    Date for PT Re-Evaluation  06/09/18    Authorization Type  BCBS    PT Start Time  0803    PT Stop Time  0845    PT Time Calculation (min)  42 min    Activity Tolerance  Patient tolerated treatment well    Behavior During Therapy  [facility] for tasks assessed/performed       Past Medical History:  Diagnosis Date  . 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 Allergy    weekly allergy shots  . Anxiety   . Asthma   . Depression   . Fibrocystic breast disease   . GERD (gastroesophageal reflux disease)   . OSA (obstructive sleep apnea) 07/31/2013  . Peptic ulcer disease   . TMJ locking     Past Surgical History:  Procedure Laterality Date  . ABDOMINAL HYSTERECTOMY  2003   due to fibroids  . ADENOIDECTOMY     x3  . BREAST CYST ASPIRATION    . COLONOSCOPY  03/10/2007   results-normal  . HAMMER TOE SURGERY    . PUBOVAGINAL SLING  '90's   for incontinence  . RHINOPLASTY  1984  . ROTATOR CUFF REPAIR Right 2013   [place]  . ROTATOR CUFF REPAIR Left 2012   [place]  . SHOULDER SURGERY    . TONSILLECTOMY     x2  . TUBAL LIGATION    . [place]  . WRIST SURGERY      There were no vitals filed for this visit.  Subjective Assessment - 05/18/18 0806    Subjective  Pt states less pain with riding in car over the weekend, and has able to increase activity , without pain.     Currently in Pain?  No/denies    Pain Score  0-No pain    Pain Location  Back    Pain Orientation  Right    Pain Descriptors /  Indicators  Aching    Pain Type  Acute pain    Pain Onset  More than a month ago    Pain Frequency  Intermittent                       [facility] Adult PT Treatment/Exercise - 05/18/18 0815      Exercises   Exercises  Lumbar      Lumbar Exercises: Stretches   Single Knee to Chest Stretch  --    Figure 4 Stretch  3 reps;30 seconds    Figure 4 Stretch Limitations  seated      Lumbar Exercises: Aerobic   Stationary Bike  L1 x 8 min      Lumbar Exercises: Standing   Other Standing Lumbar Exercises  --    Other Standing Lumbar Exercises  Standing hip abd 2x10 bil; hip ext 2x10 bil;       Lumbar Exercises: Supine   Clam  20 reps    Clam Limitations  GTB alternating    Bent Knee Raise  20 reps  Bridge  20 reps    Straight Leg Raise  20 reps    Straight Leg Raises Limitations  both      Lumbar Exercises: Sidelying   Clam  --      Manual Therapy   Manual Therapy  Joint mobilization;Soft tissue mobilization;Manual Traction;Passive ROM    Joint Mobilization  --    Soft tissue mobilization  --    Passive ROM  Passive stretching for R hip, all motions;     Manual Traction  Long leg distraction on R; 10 sec x5;                PT Short Term Goals - 05/12/18 1610      PT SHORT TERM GOAL #1   Title  Pt to be independent with initial HEP     Time  2    Period  Weeks    Status  Achieved      PT SHORT TERM GOAL #2   Title  Pt to report decreased pain to 3/10 with transfers and activity     Time  2    Period  Weeks    Status  Achieved        PT Long Term Goals - 05/18/18 9604      PT LONG TERM GOAL #1   Title  Pt to report decreased pain to 0-2/10 with transfers, and standing activity     Time  6    Period  Weeks    Status  Partially Met      PT LONG TERM GOAL #2   Title  Pt to demo improved lumbar ROM to be WNL and pain free, to improve ability for IADLs     Time  6    Period  Weeks    Status  Partially Met      PT LONG TERM GOAL #3   Title   Pt to demo soft tissue restrictions in lumbar/glute region to be [facility], to decrease pain.     Time  6    Period  Weeks    Status  Achieved            Plan - 05/18/18 0856    Clinical Impression Statement  Pt progressing well, she has minimal pain today, and was able to ride in car and increase activity level over the weekend. She has minimal pain with palpation of R SI and glute today, and also has improved ROM, and ability for strenthing ther ex. Final HEP reviewed in detail today. Pt plans to return to [facility] this week with trainer. Pt will be put on hold at this time, most goals met. She will return only if she has increased pain or needs clarification on ther ex. Will hold for 2 weeks, then d/c if she does not return. Pt in agreement with plan.     Rehab Potential  Good    PT Frequency  2x / week    PT Duration  6 weeks    PT Treatment/Interventions  ADLs/Self Care Home Management;Cryotherapy;Electrical Stimulation;Iontophoresis 76m/ml Dexamethasone;Functional mobility training;Stair training;Gait training;Ultrasound;Traction;Moist Heat;Therapeutic activities;Therapeutic exercise;Neuromuscular re-education;Cognitive remediation;Patient/family education;Passive range of motion;Manual techniques;Dry needling;Taping    Consulted and Agree with Plan of Care  Patient       Patient will benefit from skilled therapeutic intervention in order to improve the following deficits and impairments:  Decreased range of motion, Difficulty walking, Increased muscle spasms, Decreased endurance, Decreased activity tolerance, Pain, Impaired flexibility, Improper body mechanics, Decreased mobility, Decreased  strength  Visit Diagnosis: Chronic right-sided low back pain without sciatica  Low back pain without sciatica, unspecified back pain laterality, unspecified chronicity     Problem List Patient Active Problem List   Diagnosis Date Noted  . History of retinal tear 10/20/2016  . Low back pain  09/28/2015  . Insomnia 08/18/2013  . OSA (obstructive sleep apnea) 07/31/2013  . Thyroid nodule, cold 07/15/2012  . Anemia 01/10/2010  . Anxiety and depression 10/05/2007  . Allergic rhinitis due to pollen 10/05/2007  . Asthma 10/05/2007    Lyndee Hensen, PT, DPT 9:05 AM  05/18/18    [facility] [address] [place], [place], [address] Phone: [phone]   Fax:  [phone]  Name: JENNAMARIE GOINGS MRN: [id number] Date of Birth: 1950/12/06     PHYSICAL THERAPY DISCHARGE SUMMARY  Visits from Start of Care:6   Plan: Patient agrees to discharge.  Patient goals were met. Patient is being discharged due to meeting the stated rehab goals.  ?????      Lyndee Hensen, PT, DPT 9:39 AM  07/23/18

## 2018-05-18 NOTE — Patient Instructions (Signed)
Access Code: [email or web address]  URL: [email or web address]  Date: 05/18/2018  Prepared by: Lyndee Hensen   Exercises  Supine Single Knee to Chest - 3 reps - 30 hold - 3x daily  Supine Piriformis Stretch Pulling Heel to Hip - 3 reps - 30 hold - 3x daily  Seated Piriformis Stretch with Trunk Bend - 3 reps - 30 hold - 3x daily  Standing Sidebending with Chair Support - 3 reps - 30 hold - 3x daily  Supine March - 10 reps - 2 sets - 1x daily  Hooklying Isometric Clamshell - 10 reps - 2 sets - 1x daily  Sidelying Hip Abduction - 10 reps - 2 sets - 1x daily  Standing Hip Abduction - 10 reps - 2 sets - 1x daily  Standing Hip Extension - 10 reps - 2 sets - 1x daily  Supine Bridge - 10 reps - 2 sets - 1x daily

## 2018-05-20 ENCOUNTER — Encounter: Payer: BLUE CROSS/BLUE SHIELD | Admitting: Physical Therapy

## 2018-05-20 DIAGNOSIS — J301 Allergic rhinitis due to pollen: Secondary | ICD-10-CM | POA: Diagnosis not present

## 2018-05-20 DIAGNOSIS — J3089 Other allergic rhinitis: Secondary | ICD-10-CM | POA: Diagnosis not present

## 2018-05-25 DIAGNOSIS — J301 Allergic rhinitis due to pollen: Secondary | ICD-10-CM | POA: Diagnosis not present

## 2018-05-25 DIAGNOSIS — J3089 Other allergic rhinitis: Secondary | ICD-10-CM | POA: Diagnosis not present

## 2018-05-26 DIAGNOSIS — H26492 Other secondary cataract, left eye: Secondary | ICD-10-CM | POA: Diagnosis not present

## 2018-05-27 ENCOUNTER — Encounter: Payer: BLUE CROSS/BLUE SHIELD | Admitting: Physical Therapy

## 2018-05-27 DIAGNOSIS — F411 Generalized anxiety disorder: Secondary | ICD-10-CM | POA: Diagnosis not present

## 2018-05-31 ENCOUNTER — Other Ambulatory Visit (INDEPENDENT_AMBULATORY_CARE_PROVIDER_SITE_OTHER): Payer: BLUE CROSS/BLUE SHIELD

## 2018-05-31 ENCOUNTER — Ambulatory Visit: Payer: BLUE CROSS/BLUE SHIELD | Admitting: Gastroenterology

## 2018-05-31 ENCOUNTER — Encounter: Payer: Self-pay | Admitting: Gastroenterology

## 2018-05-31 VITALS — BP 124/84 | HR 71 | Ht 68.0 in | Wt 193.0 lb

## 2018-05-31 DIAGNOSIS — Z1211 Encounter for screening for malignant neoplasm of colon: Secondary | ICD-10-CM

## 2018-05-31 DIAGNOSIS — R194 Change in bowel habit: Secondary | ICD-10-CM | POA: Diagnosis not present

## 2018-05-31 DIAGNOSIS — K5900 Constipation, unspecified: Secondary | ICD-10-CM

## 2018-05-31 LAB — IGA: IGA: 127 mg/dL (ref 68–378)

## 2018-05-31 MED ORDER — NA SULFATE-K SULFATE-MG SULF 17.5-3.13-1.6 GM/177ML PO SOLN: 1.0000 | ORAL | 0 refills | Status: DC

## 2018-05-31 NOTE — Patient Instructions (Signed)
Toileting tips to help with your constipation  - Drink at least 64 ounces of water.  - I am recommending a colonoscopy (which is overdue anyway).  - Will screen for celiac disease with lab tests.   - Establish a time to try to move your bowels every day. For many people, this is after a cup of coffee.  - Sit all of the way back on the toilet keeping your back fairly straight and lean forward bending from your hips, resting your forearms on your thighs. Raising your feet with a step stool can be helpful.  - Relax the rectum feeling it bulge toward the toilet water. If you feel your rectum raising toward your body, you are contracting rather than relaxing.  - Breathe in and slowly exhale. "Belly breath" by expanding your belly towards your belly button. Keep belly expanded as you gently direct pressure down and back to the anus. A low pitched GRRR sound can assist with increasing intra-abdominal pressure.   - Repeat 3-4 times. If unsuccessful, contract the pelvic floor to restore normal tone and get off the toilet. Avoid excessive straining.  - To reduce excessive wiping by teaching your anus to normally contract, place hands on outer aspect of knees and resist knee movement outward. Hold 5-10 second then place hands just inside of knees and resist inward movement of knees. Hold 5 seconds. Repeat a few times each way.

## 2018-05-31 NOTE — H&P (View-Only) (Signed)
 Referring Provider: Wallace, Erica, DO Primary Care Physician:  Wallace, Erica, DO   Reason for Consultation:  Constipation   IMPRESSION:  Change in bowel habits - Constipation     -No alarm features History of colon polyps with last colonoscopy in 2008 or 2009 Prior colonoscopy with Dr. 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 Medhoff  Reviewed toileting tips to help with constipation Colonoscopy   I consented the patient at the bedside today discussing the risks, benefits, and alternatives to endoscopic evaluation. In particular, we discussed the risks that include, but are not limited to, reaction to medication, cardiopulmonary compromise, bleeding requiring blood transfusion, aspiration resulting in pneumonia, perforation requiring surgery, lack of diagnosis, severe illness requiring hospitalization, and even death. We reviewed the risk of missed lesion including polyps or even cancer. The patient acknowledges these risks and asks that we proceed.  HPI: Chelsey Jackson is a 67 y.o. female who self-referred for constipation and need for colon cancer screening. The history is obtained through the patient and review of her electronic health record.   Per the patient report, two prior colonoscopies with Dr. Medhoff.  These reports are not available in care everywhere.  Polyps on the initial screening colonoscopy. Last colonoscopy in 2008 or 2009. No polyps. 10 year surveillance recommended.  Told that she is difficult to intubate - although using pediatric equipment she has never had any difficulty.  She is actually never had a difficult intubation.  However anesthesiologist have used pediatric equipment in the past.  Two daughters diagnosed with celiac by labs and endoscopy over the last year.   Recently  developed constipation that developed while traveling.  Feels this was exacerbated by stress as the constipation developed after daughter was married and father died within the same month.  Lifetime history of daily BM.  Miralax daily x 5-6 days provided no relief. Ducolax worked. Tried Senekot when she refilled it yesterday.   Drinks one cup of caffeinated coffees daily. Drinks iced decaf throughout the day. Drinks lots of water.  No associated abdominal pain or cramping.  No melena, hematochezia, bright red blood per rectum.  No mucus.  No straining. No other associated symptoms.  Weight is stable.  Appetite is good.  No identified exacerbating or relieving features.   Past Medical History:  Diagnosis Date  . 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 Allergy    weekly allergy shots  . Anxiety   . Asthma   . Depression   . Fibrocystic breast disease   . GERD (gastroesophageal reflux disease)   . OSA (obstructive sleep apnea) 07/31/2013  . Peptic ulcer disease   . TMJ locking     Past Surgical History:  Procedure Laterality Date  . ABDOMINAL HYSTERECTOMY  2003   due to fibroids  . ADENOIDECTOMY     x3  . BREAST CYST ASPIRATION    . COLONOSCOPY  03/10/2007   results-normal  . HAMMER TOE SURGERY    . PUBOVAGINAL SLING  '90's   for incontinence  . RHINOPLASTY  1984  . ROTATOR CUFF REPAIR Right 2013   [place]  . ROTATOR CUFF REPAIR Left 2012   [place]  . SHOULDER SURGERY    . TONSILLECTOMY     x2  . TUBAL   LIGATION    . TURBINATE REDUCTION  1997  . WRIST SURGERY      Current Outpatient Medications  Medication Sig Dispense Refill  . buPROPion (WELLBUTRIN XL) 300 MG 24 hr tablet Take 300 mg by mouth daily.    . Calcium Carbonate-Vitamin D (CALTRATE 600+D) 600-400 MG-UNIT per tablet Take 1 tablet by mouth daily.      . EPINEPHrine 0.3 mg/0.3 mL IJ SOAJ injection Inject into the muscle once.    . fluticasone (FLONASE) 50  MCG/ACT nasal spray Place 2 sprays into both nostrils daily.     . Iron-Vitamin C (VITRON-C) 65-125 MG TABS Take 1 tablet by mouth daily.    . levocetirizine (XYZAL) 5 MG tablet Take 5 mg by mouth every evening.    . Multiple Vitamin (MULTIVITAMIN) tablet Take 1 tablet by mouth daily.      . PROAIR RESPICLICK 108 (90 Base) MCG/ACT AEPB as needed.     . temazepam (RESTORIL) 30 MG capsule Take 30 mg by mouth at bedtime as needed for sleep.    . Na Sulfate-K Sulfate-Mg Sulf 17.5-3.13-1.6 GM/177ML SOLN Take 1 kit by mouth as directed for 30 days. 354 mL 0   No current facility-administered medications for this visit.     Allergies as of 05/31/2018 - Review Complete 05/31/2018  Allergen Reaction Noted  . Penicillins Anaphylaxis 04/06/2016    Family History  Problem Relation Age of Onset  . Osteoporosis Mother   . Fibroids Mother   . Other Mother        aortic valve replacement 1994  . Lung cancer Mother 78  . Heart disease Father   . Testicular cancer Father 50  . Prostate cancer Father 77  . Diabetes Maternal Grandmother   . Heart disease Maternal Grandfather   . Heart disease Paternal Grandmother   . Diabetes Paternal Grandfather   . Stomach cancer Paternal Grandfather   . Breast cancer Paternal Aunt   . Breast cancer Cousin   . Colon cancer Neg Hx   . Throat cancer Neg Hx   . Pancreatic cancer Neg Hx     Social History   Socioeconomic History  . Marital status: Married    Spouse name: Not on file  . Number of children: Not on file  . Years of education: Not on file  . Highest education level: Not on file  Occupational History  . Not on file  Social Needs  . Financial resource strain: Not on file  . Food insecurity:    Worry: Not on file    Inability: Not on file  . Transportation needs:    Medical: Not on file    Non-medical: Not on file  Tobacco Use  . Smoking status: Former Smoker    Packs/day: 2.00    Years: 15.00    Pack years: 30.00    Types: Cigarettes     Last attempt to quit: 11/19/1989    Years since quitting: 28.5  . Smokeless tobacco: Never Used  Substance and Sexual Activity  . Alcohol use: Yes    Comment: maybe a glass of wine on the weekend  . Drug use: No  . Sexual activity: Not on file  Lifestyle  . Physical activity:    Days per week: Not on file    Minutes per session: Not on file  . Stress: Not on file  Relationships  . Social connections:    Talks on phone: Not on file    Gets together: Not on   file    Attends religious service: Not on file    Active member of club or organization: Not on file    Attends meetings of clubs or organizations: Not on file    Relationship status: Not on file  . Intimate partner violence:    Fear of current or ex partner: Not on file    Emotionally abused: Not on file    Physically abused: Not on file    Forced sexual activity: Not on file  Other Topics Concern  . 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  Filed Weights   05/31/18 0901  Weight: 193 lb (87.5 kg)    Physical Exam: Vital signs were reviewed. General:   Alert, well-nourished, pleasant and cooperative in NAD. Fairy hair.  Head:  Normocephalic and atraumatic. Eyes:  Sclera clear, no icterus.   Conjunctiva pink. Mouth:  No deformity or lesions.   Neck:  Supple; no thyromegaly. Lungs:  Clear throughout to auscultation.   No wheezes.  Heart:  Regular rate and rhythm; no murmurs Abdomen:  Soft, nontender, central obesity. normal bowel sounds. No rebound or guarding. No hepatosplenomegaly Rectal:  Deferred  Msk:  Symmetrical without gross deformities. Extremities:  No gross deformities or edema. Neurologic:  Alert and  oriented x4;  grossly nonfocal Skin:  No rash or bruise. Psych:  Alert and cooperative. Normal mood and  affect.   Ellenor Wisniewski L. Katlyne Nishida, MD, MPH [facility] 05/31/2018, 1:02 PM     

## 2018-05-31 NOTE — Progress Notes (Deleted)
Duplicate entry

## 2018-05-31 NOTE — Progress Notes (Signed)
Referring Provider: Briscoe Deutscher, DO Primary Care Physician:  Briscoe Deutscher, DO   Reason for Consultation:  Constipation   IMPRESSION:  Change in bowel habits - Constipation     -No alarm features History of colon polyps with last colonoscopy in 2008 or 2009 Prior colonoscopy with Dr. 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 Allyn Kenner  Reviewed toileting tips to help with constipation Colonoscopy   I consented the patient at the bedside today discussing the risks, benefits, and alternatives to endoscopic evaluation. In particular, we discussed the risks that include, but are not limited to, reaction to medication, cardiopulmonary compromise, bleeding requiring blood transfusion, aspiration resulting in pneumonia, perforation requiring surgery, lack of diagnosis, severe illness requiring hospitalization, and even death. We reviewed the risk of missed lesion including polyps or even cancer. The patient acknowledges these risks and asks that we proceed.  HPI: Chelsey Jackson is a 68 y.o. female who self-referred for constipation and need for colon cancer screening. The history is obtained through the patient and review of her electronic health record.   Per the patient report, two prior colonoscopies with Dr. Allyn Kenner.  These reports are not available in care everywhere.  Polyps on the initial screening colonoscopy. Last colonoscopy in 2008 or 2009. No polyps. 10 year surveillance recommended.  Told that she is difficult to intubate - although using pediatric equipment she has never had any difficulty.  She is actually never had a difficult intubation.  However anesthesiologist have used pediatric equipment in the past.  Two daughters diagnosed with celiac by labs and endoscopy over the last year.   Recently  developed constipation that developed while traveling.  Feels this was exacerbated by stress as the constipation developed after daughter was married and father died within the same month.  Lifetime history of daily BM.  Miralax daily x 5-6 days provided no relief. Ducolax worked. Tried Senekot when she refilled it yesterday.   Drinks one cup of caffeinated coffees daily. Drinks iced decaf throughout the day. Drinks lots of water.  No associated abdominal pain or cramping.  No melena, hematochezia, bright red blood per rectum.  No mucus.  No straining. No other associated symptoms.  Weight is stable.  Appetite is good.  No identified exacerbating or relieving features.   Past Medical History:  Diagnosis Date  . 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 Allergy    weekly allergy shots  . Anxiety   . Asthma   . Depression   . Fibrocystic breast disease   . GERD (gastroesophageal reflux disease)   . OSA (obstructive sleep apnea) 07/31/2013  . Peptic ulcer disease   . TMJ locking     Past Surgical History:  Procedure Laterality Date  . ABDOMINAL HYSTERECTOMY  2003   due to fibroids  . ADENOIDECTOMY     x3  . BREAST CYST ASPIRATION    . COLONOSCOPY  03/10/2007   results-normal  . HAMMER TOE SURGERY    . PUBOVAGINAL SLING  '90's   for incontinence  . RHINOPLASTY  1984  . ROTATOR CUFF REPAIR Right 2013   [place]  . ROTATOR CUFF REPAIR Left 2012   [facility]  . SHOULDER SURGERY    . TONSILLECTOMY     x2  . TUBAL  LIGATION    . [place]  . WRIST SURGERY      Current Outpatient Medications  Medication Sig Dispense Refill  . buPROPion (WELLBUTRIN XL) 300 MG 24 hr tablet Take 300 mg by mouth daily.    . Calcium Carbonate-Vitamin D (CALTRATE 600+D) 600-400 MG-UNIT per tablet Take 1 tablet by mouth daily.      Marland Kitchen EPINEPHrine 0.3 mg/0.3 mL IJ SOAJ injection Inject into the muscle once.    . fluticasone (FLONASE) 50  MCG/ACT nasal spray Place 2 sprays into both nostrils daily.     . Iron-Vitamin C (VITRON-C) 65-125 MG TABS Take 1 tablet by mouth daily.    Marland Kitchen levocetirizine (XYZAL) 5 MG tablet Take 5 mg by mouth every evening.    . Multiple Vitamin (MULTIVITAMIN) tablet Take 1 tablet by mouth daily.      Marland Kitchen PROAIR RESPICLICK 035 (90 Base) MCG/ACT AEPB as needed.     . temazepam (RESTORIL) 30 MG capsule Take 30 mg by mouth at bedtime as needed for sleep.    . Na Sulfate-K Sulfate-Mg Sulf 17.5-3.13-1.6 GM/177ML SOLN Take 1 kit by mouth as directed for 30 days. 354 mL 0   No current facility-administered medications for this visit.     Allergies as of 05/31/2018 - Review Complete 05/31/2018  Allergen Reaction Noted  . Penicillins Anaphylaxis 04/06/2016    Family History  Problem Relation Age of Onset  . Osteoporosis Mother   . Fibroids Mother   . Other Mother        aortic valve replacement 1994  . Lung cancer Mother 74  . Heart disease Father   . Testicular cancer Father 33  . Prostate cancer Father 29  . Diabetes Maternal Grandmother   . Heart disease Maternal Grandfather   . Heart disease Paternal Grandmother   . Diabetes Paternal Grandfather   . Stomach cancer Paternal Grandfather   . Breast cancer Paternal Aunt   . Breast cancer Cousin   . Colon cancer Neg Hx   . Throat cancer Neg Hx   . Pancreatic cancer Neg Hx     Social History   Socioeconomic History  . Marital status: Married    Spouse name: Not on file  . Number of children: Not on file  . Years of education: Not on file  . Highest education level: Not on file  Occupational History  . Not on file  Social Needs  . Financial resource strain: Not on file  . Food insecurity:    Worry: Not on file    Inability: Not on file  . Transportation needs:    Medical: Not on file    Non-medical: Not on file  Tobacco Use  . Smoking status: Former Smoker    Packs/day: 2.00    Years: 15.00    Pack years: 30.00    Types: Cigarettes     Last attempt to quit: 11/19/1989    Years since quitting: 28.5  . Smokeless tobacco: Never Used  Substance and Sexual Activity  . Alcohol use: Yes    Comment: maybe a glass of wine on the weekend  . Drug use: No  . Sexual activity: Not on file  Lifestyle  . Physical activity:    Days per week: Not on file    Minutes per session: Not on file  . Stress: Not on file  Relationships  . Social connections:    Talks on phone: Not on file    Gets together: Not on  file    Attends religious service: Not on file    Active member of club or organization: Not on file    Attends meetings of clubs or organizations: Not on file    Relationship status: Not on file  . Intimate partner violence:    Fear of current or ex partner: Not on file    Emotionally abused: Not on file    Physically abused: Not on file    Forced sexual activity: Not on file  Other Topics Concern  . 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  Filed Weights   05/31/18 0901  Weight: 193 lb (87.5 kg)    Physical Exam: Vital signs were reviewed. General:   Alert, well-nourished, pleasant and cooperative in NAD. Fairy hair.  Head:  Normocephalic and atraumatic. Eyes:  Sclera clear, no icterus.   Conjunctiva pink. Mouth:  No deformity or lesions.   Neck:  Supple; no thyromegaly. Lungs:  Clear throughout to auscultation.   No wheezes.  Heart:  Regular rate and rhythm; no murmurs Abdomen:  Soft, nontender, central obesity. normal bowel sounds. No rebound or guarding. No hepatosplenomegaly Rectal:  Deferred  Msk:  Symmetrical without gross deformities. Extremities:  No gross deformities or edema. Neurologic:  Alert and  oriented x4;  grossly nonfocal Skin:  No rash or bruise. Psych:  Alert and cooperative. Normal mood and  affect.   Kimberly L. Tarri Glenn, MD, MPH [facility] 05/31/2018, 1:02 PM

## 2018-06-01 LAB — TISSUE TRANSGLUTAMINASE, IGA: (tTG) Ab, IgA: 1 U/mL

## 2018-06-02 DIAGNOSIS — J3089 Other allergic rhinitis: Secondary | ICD-10-CM | POA: Diagnosis not present

## 2018-06-02 DIAGNOSIS — J301 Allergic rhinitis due to pollen: Secondary | ICD-10-CM | POA: Diagnosis not present

## 2018-06-03 DIAGNOSIS — H35371 Puckering of macula, right eye: Secondary | ICD-10-CM | POA: Diagnosis not present

## 2018-06-08 DIAGNOSIS — J3089 Other allergic rhinitis: Secondary | ICD-10-CM | POA: Diagnosis not present

## 2018-06-08 DIAGNOSIS — J301 Allergic rhinitis due to pollen: Secondary | ICD-10-CM | POA: Diagnosis not present

## 2018-06-14 DIAGNOSIS — J301 Allergic rhinitis due to pollen: Secondary | ICD-10-CM | POA: Diagnosis not present

## 2018-06-14 DIAGNOSIS — J3089 Other allergic rhinitis: Secondary | ICD-10-CM | POA: Diagnosis not present

## 2018-06-16 ENCOUNTER — Ambulatory Visit (INDEPENDENT_AMBULATORY_CARE_PROVIDER_SITE_OTHER): Payer: BLUE CROSS/BLUE SHIELD | Admitting: Psychology

## 2018-06-16 DIAGNOSIS — F331 Major depressive disorder, recurrent, moderate: Secondary | ICD-10-CM | POA: Diagnosis not present

## 2018-06-16 DIAGNOSIS — F332 Major depressive disorder, recurrent severe without psychotic features: Secondary | ICD-10-CM | POA: Diagnosis not present

## 2018-06-16 IMAGING — US US THYROID
1 series · 13 of 25 positions shown · non-contrast
Comparison: Thyroid ultrasound - 06/11/2011; ultrasound-guided
right-sided thyroid nodule fine-needle aspiration - 07/09/2011

CLINICAL DATA: Goiter.

EXAM:
THYROID ULTRASOUND
TECHNIQUE: Ultrasound examination of the thyroid gland and adjacent soft
tissues was performed.

[Series 1: us thyroid · 0.05mm/px · 13 of 50 slices shown]
[im 1/50]
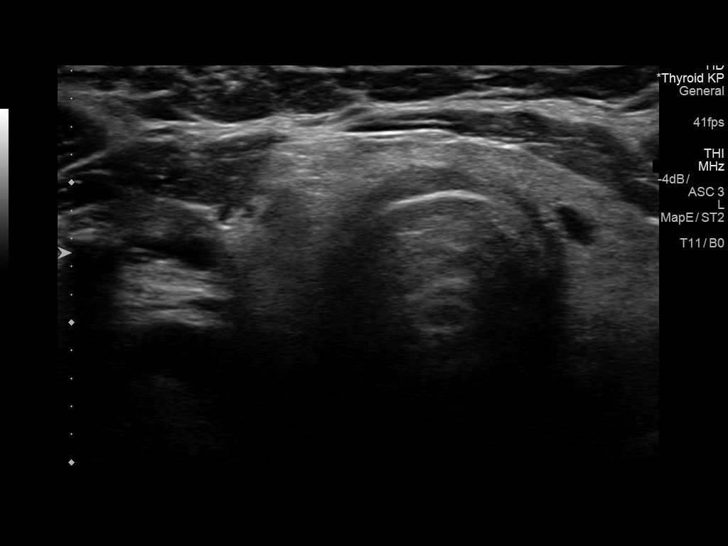
[im 5/50]
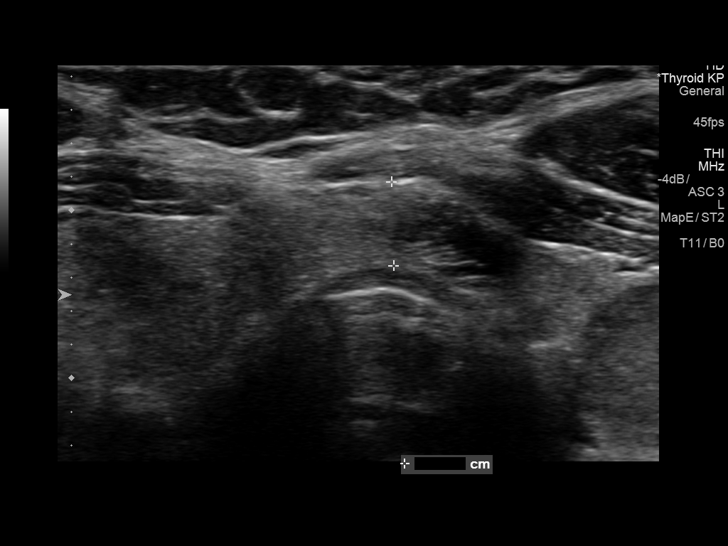
[im 9/50]
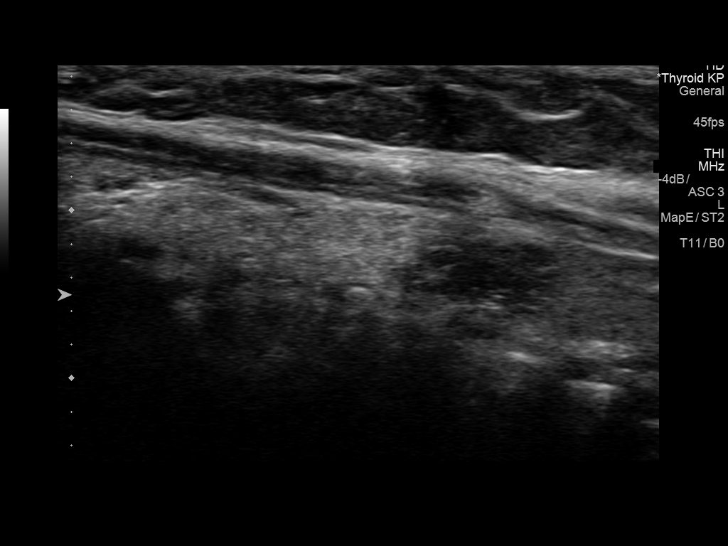
[im 13/50]
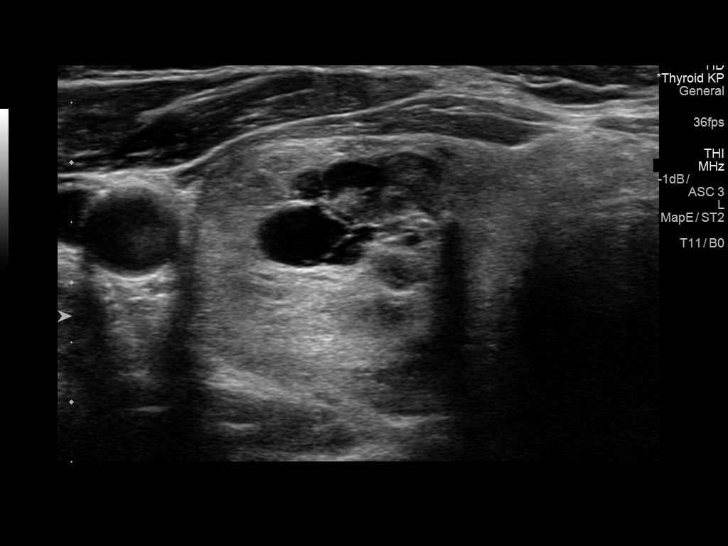
[im 17/50]
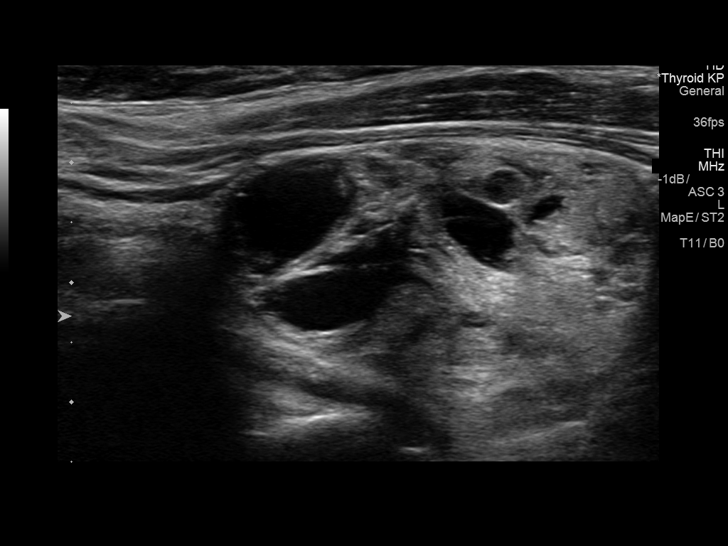
[im 21/50]
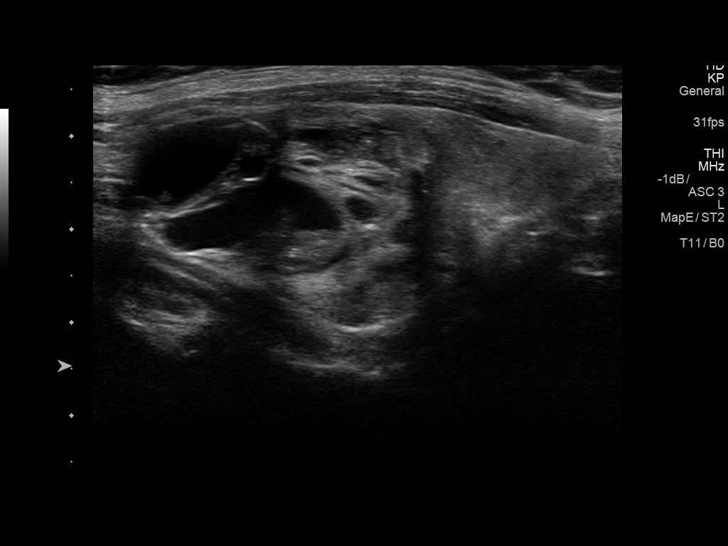
[im 25/50]
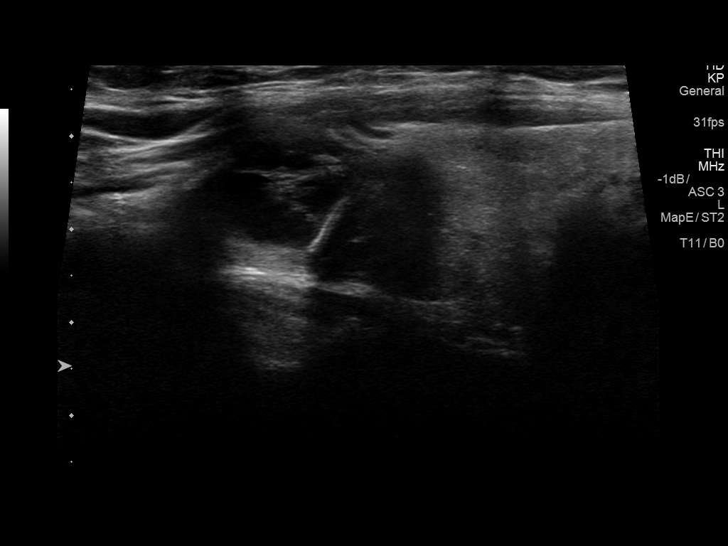
[im 29/50]
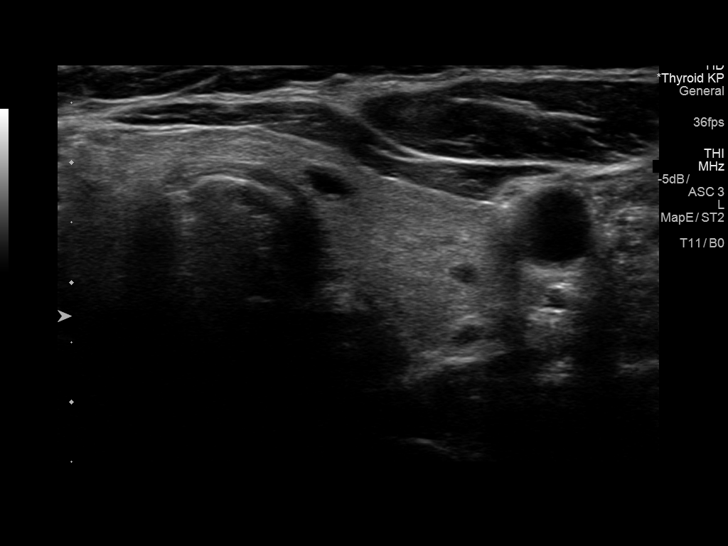
[im 33/50]
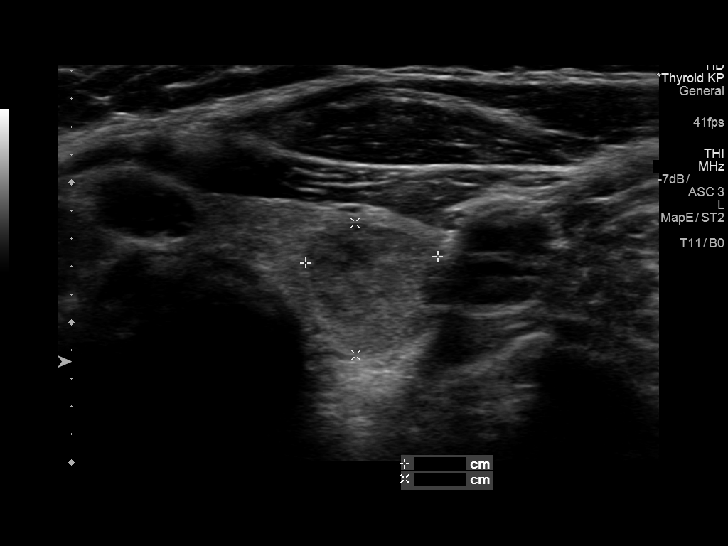
[im 37/50]
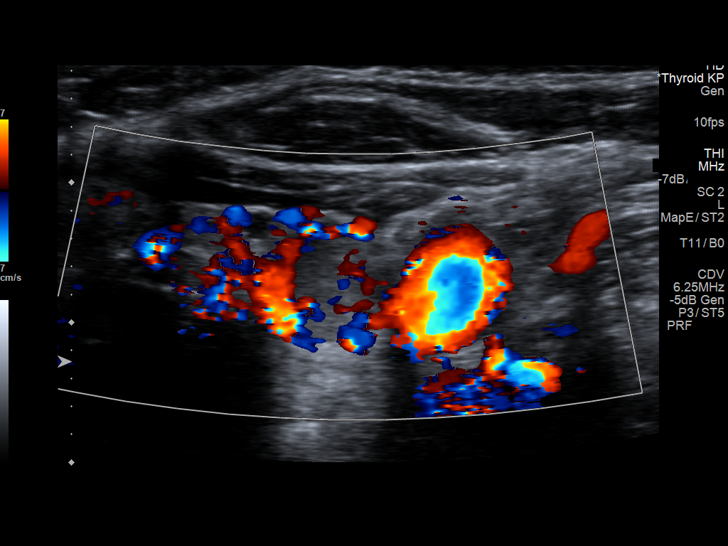
[im 41/50]
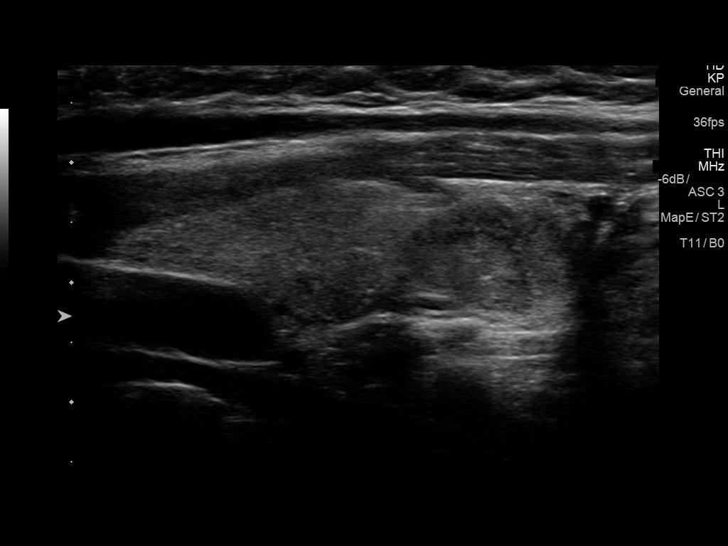
[im 45/50]
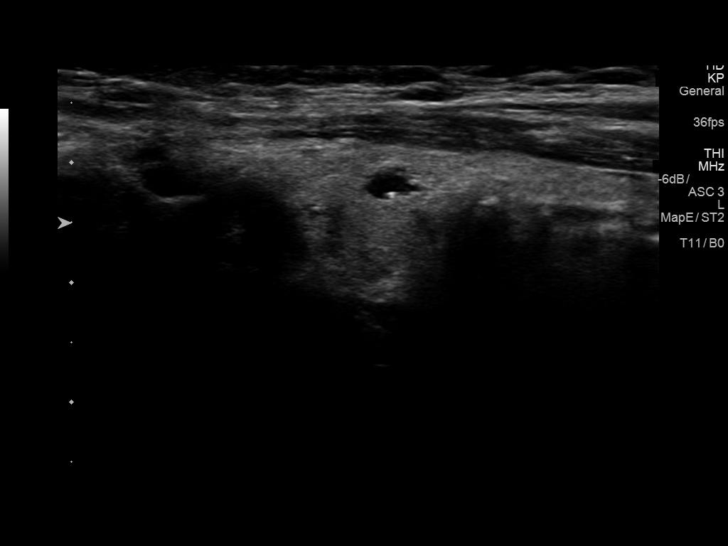
[im 50/50]
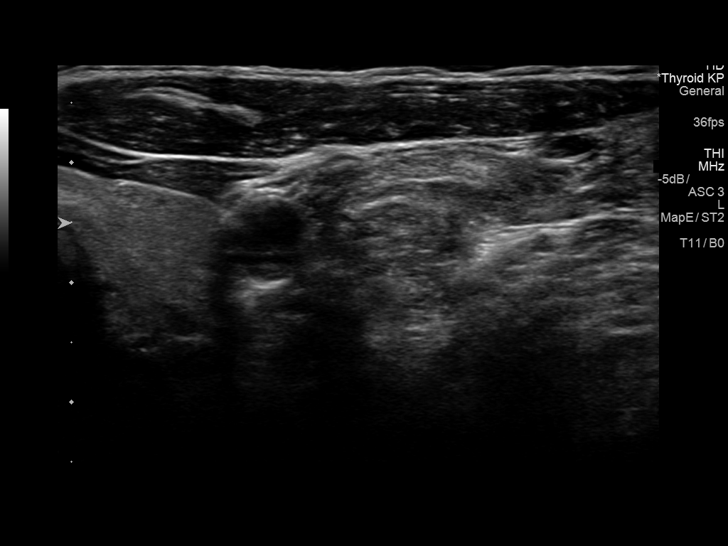

[13 of 25 positions shown; findings below may reference images not displayed]

FINDINGS: Parenchymal Echotexture: Normal

Isthmus: Normal in size measuring 0.5 cm in diameter, unchanged

Right lobe: Normal in size measuring 5.3 x 2.4 x 2.7 cm, unchanged,
previously, 5.4 x 2.6 x 3.2 cm

Left lobe: Normal in size measuring 4.5 x 1.4 x 1.6 cm, unchanged,
previously, 4.9 x 1.5 x 1.6 cm

_________________________________________________________

Estimated total number of nodules >/= 1 cm: 2

Number of spongiform nodules >/=  2 cm not described below (TR1): 0

Number of mixed cystic and solid nodules >/= 1.5 cm not described
below (TR2): 0

_________________________________________________________

There is an approximately 0.9 x 0.8 x 0.5 cm partially cystic,
partially solid nodule within the left side of the thyroid isthmus
which appears unchanged compared to the [DATE] examination,
previously, 0.9 x 0.7 x 0.4 cm. This nodule does not meet imaged
criteria to recommend percutaneous sampling or dedicated follow-up.

The previously biopsied approximately 3.5 x 2.3 x 2.4 cm nodule /
mass within the superior/ mid aspect the right lobe of the thyroid
appears unchanged to decreased in size compared to remote
examination performed 06/19/2011, previously, 3.6 x 2.7 x 3.0 cm.
Correlation with prior biopsy results is recommended.

There is a punctate (approximately 1.5 x 1.0 x 1.0 cm nodule within
the inferior pole the left lobe of the thyroid which appear
unchanged to decreased in size compared to the [DATE] examination,
previously, 1.8 x 1.1 x 1.1 cm. Stability for greater than 5 years
is indicative of a benign etiology.
IMPRESSION: 1. Similar findings of multinodular goiter. No new or enlarging
thyroid nodules.
2. Previously biopsied approximately 3.5 cm nodule/mass within the
superior/mid aspect the right lobe of the thyroid is unchanged to
decreased in size compared to remote examination performed [DATE].
Correlation with prior biopsy results is recommended. Assuming a
benign pathologic diagnosis, as well as stability for greater than 5
years, repeat sampling and/or continued dedicated follow-up is not
recommended.
The above is in keeping with the ACR TI-RADS recommendations - [HOSPITAL] 4013;[DATE].

## 2018-06-21 ENCOUNTER — Ambulatory Visit: Payer: BLUE CROSS/BLUE SHIELD | Admitting: Psychology

## 2018-06-21 DIAGNOSIS — J3089 Other allergic rhinitis: Secondary | ICD-10-CM | POA: Diagnosis not present

## 2018-06-21 DIAGNOSIS — J301 Allergic rhinitis due to pollen: Secondary | ICD-10-CM | POA: Diagnosis not present

## 2018-06-22 ENCOUNTER — Ambulatory Visit (INDEPENDENT_AMBULATORY_CARE_PROVIDER_SITE_OTHER): Payer: BLUE CROSS/BLUE SHIELD | Admitting: Orthopaedic Surgery

## 2018-06-22 ENCOUNTER — Encounter (INDEPENDENT_AMBULATORY_CARE_PROVIDER_SITE_OTHER): Payer: Self-pay | Admitting: Orthopaedic Surgery

## 2018-06-22 ENCOUNTER — Ambulatory Visit (INDEPENDENT_AMBULATORY_CARE_PROVIDER_SITE_OTHER): Payer: BLUE CROSS/BLUE SHIELD

## 2018-06-22 VITALS — BP 122/77 | HR 68 | Ht 68.0 in | Wt 190.0 lb

## 2018-06-22 DIAGNOSIS — M1812 Unilateral primary osteoarthritis of first carpometacarpal joint, left hand: Secondary | ICD-10-CM | POA: Insufficient documentation

## 2018-06-22 DIAGNOSIS — G8929 Other chronic pain: Secondary | ICD-10-CM | POA: Insufficient documentation

## 2018-06-22 DIAGNOSIS — M79645 Pain in left finger(s): Secondary | ICD-10-CM | POA: Diagnosis not present

## 2018-06-22 MED ORDER — METHYLPREDNISOLONE ACETATE 40 MG/ML IJ SUSP
20.0000 mg | INTRAMUSCULAR | Status: AC | PRN
  Administered 2018-06-22: 20 mg via INTRA_ARTICULAR

## 2018-06-22 MED ORDER — LIDOCAINE HCL 1 % IJ SOLN
0.5000 mL | INTRAMUSCULAR | Status: AC | PRN
  Administered 2018-06-22: .5 mL

## 2018-06-22 NOTE — Progress Notes (Signed)
Office Visit Note   Patient: Chelsey Jackson           Date of Birth: 11/27/1950           MRN: [id number] Visit Date: 06/22/2018              Requested by: Briscoe Deutscher, [address] [place], [place] [address] PCP: Briscoe Deutscher, DO   Assessment & Plan: Visit Diagnoses:  1. Chronic pain of left thumb   2. Primary osteoarthritis of first carpometacarpal joint of left hand     Plan: Inject left thumb carpometacarpal joint and monitor response.  Will also apply a Freedom splint  Follow-Up Instructions: Return if symptoms worsen or fail to improve.   Orders:  Orders Placed This Encounter  Procedures  . Small Joint Inj: L thumb CMC  . XR Finger Thumb Left   No orders of the defined types were placed in this encounter.     Procedures: Small Joint Inj: L thumb CMC on 06/22/2018 12:09 PM Details: 27 G needle, dorsal approach  Spinal Needle: No  Medications: 0.5 mL lidocaine 1 %; 20 mg methylPREDNISolone acetate 40 MG/ML      Clinical Data: No additional findings.   Subjective: Chief Complaint  Patient presents with  . Left Hand - Pain  Patient presents today with left thumb pain X2 weeks. She said that the pain is not constant. No known injury. She has tried Voltaren gel, but no relief. She is right hand dominant. The pain is more at the proximal end of her thumb. No decreased range of motion. Has not had any numbness or tingling.  First noted problem with holding a tablet to read at night.  Pain seems to be localized at the base of the thumb below she was not that symptomatic in the office today  HPI  Review of Systems   Objective: Vital Signs: BP 122/77   Pulse 68   Ht 5\' 8"  (1.727 m)   Wt 190 lb (86.2 kg)   LMP  (LMP Unknown)   BMI 28.89 kg/m   Physical Exam Constitutional:      Appearance: She is well-developed.  Eyes:     Pupils: Pupils are equal, round, and reactive to light.  Pulmonary:     Effort: Pulmonary effort is normal.    Skin:    General: Skin is warm and dry.  Neurological:     Mental Status: She is alert and oriented to person, place, and time.  Psychiatric:        Behavior: Behavior normal.     Ortho Exam awake alert and oriented x3.  Comfortable sitting.  Very mild tenderness at the left thumb carpal metacarpal joint.  No erythema.  No obvious subluxation or deformity.  Good grip and good release.  No pain over the first dorsal extensor, no swelling of the thumb.  Neurologically intact.  Normal active extension and flexion across the MP and IP joint of the left thumb  Specialty Comments:  No specialty comments available.  Imaging: Xr Finger Thumb Left  Result Date: 06/22/2018 Films of the left thumb were obtained in several projections.  There is some mild arthritis at the metacarpal carpal joint with slight subluxation.  Joint space is still maintained but somewhat narrowed.  No ectopic calcification.  No acute change    PMFS History: Patient Active Problem List   Diagnosis Date Noted  . Primary osteoarthritis of first carpometacarpal joint of left hand 06/22/2018  .  Chronic pain of left thumb 06/22/2018  . History of retinal tear 10/20/2016  . Low back pain 09/28/2015  . Insomnia 08/18/2013  . OSA (obstructive sleep apnea) 07/31/2013  . Thyroid nodule, cold 07/15/2012  . Anemia 01/10/2010  . Anxiety and depression 10/05/2007  . Allergic rhinitis due to pollen 10/05/2007  . Asthma 10/05/2007   Past Medical History:  Diagnosis Date  . 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 Allergy    weekly allergy shots  . Anxiety   . Asthma   . Depression   . Fibrocystic breast disease   . GERD (gastroesophageal reflux disease)   . OSA (obstructive sleep apnea) 07/31/2013  . Peptic ulcer disease   . TMJ locking     Family History  Problem Relation Age of Onset  . Osteoporosis Mother   . Fibroids Mother   . Other Mother        aortic  valve replacement 1994  . Lung cancer Mother 30  . Heart disease Father   . Testicular cancer Father 67  . Prostate cancer Father 22  . Diabetes Maternal Grandmother   . Heart disease Maternal Grandfather   . Heart disease Paternal Grandmother   . Diabetes Paternal Grandfather   . Stomach cancer Paternal Grandfather   . Breast cancer Paternal Aunt   . Breast cancer Cousin   . Colon cancer Neg Hx   . Throat cancer Neg Hx   . Pancreatic cancer Neg Hx     Past Surgical History:  Procedure Laterality Date  . ABDOMINAL HYSTERECTOMY  2003   due to fibroids  . ADENOIDECTOMY     x3  . BREAST CYST ASPIRATION    . COLONOSCOPY  03/10/2007   results-normal  . HAMMER TOE SURGERY    . PUBOVAGINAL SLING  '90's   for incontinence  . RHINOPLASTY  1984  . ROTATOR CUFF REPAIR Right 2013   Ashlea Dusing  . ROTATOR CUFF REPAIR Left 2012   [place]ia Justman  . SHOULDER SURGERY    . TONSILLECTOMY     x2  . TUBAL LIGATION    . [place]  . WRIST SURGERY     Social History   Occupational History  . Not on file  Tobacco Use  . Smoking status: Former Smoker    Packs/day: 2.00    Years: 15.00    Pack years: 30.00    Types: Cigarettes    Last attempt to quit: 11/19/1989    Years since quitting: 28.6  . Smokeless tobacco: Never Used  Substance and Sexual Activity  . Alcohol use: Yes    Comment: maybe a glass of wine on the weekend  . Drug use: No  . Sexual activity: Not on file

## 2018-06-28 DIAGNOSIS — J3089 Other allergic rhinitis: Secondary | ICD-10-CM | POA: Diagnosis not present

## 2018-06-28 DIAGNOSIS — J301 Allergic rhinitis due to pollen: Secondary | ICD-10-CM | POA: Diagnosis not present

## 2018-06-29 ENCOUNTER — Ambulatory Visit (HOSPITAL_COMMUNITY): Payer: BLUE CROSS/BLUE SHIELD | Admitting: Anesthesiology

## 2018-06-29 ENCOUNTER — Ambulatory Visit (HOSPITAL_COMMUNITY)
Admit: 2018-06-29 | Discharge: 2018-06-29 | Disposition: A | Discharge status: Home / Self Care | Payer: BLUE CROSS/BLUE SHIELD | Attending: Gastroenterology | Admitting: Gastroenterology

## 2018-06-29 ENCOUNTER — Ambulatory Visit: Payer: BLUE CROSS/BLUE SHIELD | Admitting: Gastroenterology

## 2018-06-29 ENCOUNTER — Encounter: Payer: Self-pay | Admitting: Gastroenterology

## 2018-06-29 ENCOUNTER — Encounter (HOSPITAL_COMMUNITY): Payer: Self-pay | Admitting: Gastroenterology

## 2018-06-29 ENCOUNTER — Encounter (HOSPITAL_COMMUNITY): Disposition: A | Discharge status: Home / Self Care | Payer: Self-pay | Attending: Gastroenterology

## 2018-06-29 ENCOUNTER — Other Ambulatory Visit: Payer: Self-pay

## 2018-06-29 VITALS — BP 148/79 | HR 63 | Temp 97.5°F | Ht 68.0 in | Wt 193.0 lb

## 2018-06-29 DIAGNOSIS — Z7951 Long term (current) use of inhaled steroids: Secondary | ICD-10-CM | POA: Diagnosis not present

## 2018-06-29 DIAGNOSIS — Z8711 Personal history of peptic ulcer disease: Secondary | ICD-10-CM | POA: Insufficient documentation

## 2018-06-29 DIAGNOSIS — F329 Major depressive disorder, single episode, unspecified: Secondary | ICD-10-CM | POA: Diagnosis not present

## 2018-06-29 DIAGNOSIS — K5730 Diverticulosis of large intestine without perforation or abscess without bleeding: Secondary | ICD-10-CM | POA: Insufficient documentation

## 2018-06-29 DIAGNOSIS — Z86010 Personal history of colonic polyps: Secondary | ICD-10-CM | POA: Diagnosis not present

## 2018-06-29 DIAGNOSIS — K5900 Constipation, unspecified: Secondary | ICD-10-CM | POA: Insufficient documentation

## 2018-06-29 DIAGNOSIS — K621 Rectal polyp: Secondary | ICD-10-CM

## 2018-06-29 DIAGNOSIS — G4730 Sleep apnea, unspecified: Secondary | ICD-10-CM | POA: Diagnosis not present

## 2018-06-29 DIAGNOSIS — F419 Anxiety disorder, unspecified: Secondary | ICD-10-CM | POA: Insufficient documentation

## 2018-06-29 DIAGNOSIS — D125 Benign neoplasm of sigmoid colon: Secondary | ICD-10-CM | POA: Diagnosis not present

## 2018-06-29 DIAGNOSIS — D124 Benign neoplasm of descending colon: Secondary | ICD-10-CM | POA: Insufficient documentation

## 2018-06-29 DIAGNOSIS — Z79899 Other long term (current) drug therapy: Secondary | ICD-10-CM | POA: Diagnosis not present

## 2018-06-29 DIAGNOSIS — Z87891 Personal history of nicotine dependence: Secondary | ICD-10-CM | POA: Insufficient documentation

## 2018-06-29 DIAGNOSIS — D120 Benign neoplasm of cecum: Secondary | ICD-10-CM

## 2018-06-29 DIAGNOSIS — K644 Residual hemorrhoidal skin tags: Secondary | ICD-10-CM | POA: Insufficient documentation

## 2018-06-29 DIAGNOSIS — K635 Polyp of colon: Secondary | ICD-10-CM

## 2018-06-29 DIAGNOSIS — Z8379 Family history of other diseases of the digestive system: Secondary | ICD-10-CM | POA: Diagnosis not present

## 2018-06-29 DIAGNOSIS — D122 Benign neoplasm of ascending colon: Secondary | ICD-10-CM | POA: Diagnosis not present

## 2018-06-29 DIAGNOSIS — G4733 Obstructive sleep apnea (adult) (pediatric): Secondary | ICD-10-CM | POA: Insufficient documentation

## 2018-06-29 DIAGNOSIS — Z1211 Encounter for screening for malignant neoplasm of colon: Secondary | ICD-10-CM | POA: Diagnosis not present

## 2018-06-29 HISTORY — PX: COLONOSCOPY WITH PROPOFOL: SHX5780

## 2018-06-29 HISTORY — DX: Adverse effect of unspecified anesthetic, initial encounter: T41.45XA

## 2018-06-29 HISTORY — PX: POLYPECTOMY: SHX5525

## 2018-06-29 HISTORY — DX: Other complications of anesthesia, initial encounter: T88.59XA

## 2018-06-29 HISTORY — PX: BIOPSY: SHX5522

## 2018-06-29 SURGERY — COLONOSCOPY WITH PROPOFOL
Anesthesia: Monitor Anesthesia Care

## 2018-06-29 MED ORDER — PROPOFOL 10 MG/ML IV BOLUS
INTRAVENOUS | Status: DC | PRN
  Administered 2018-06-29: 40 mg via INTRAVENOUS
  Administered 2018-06-29: 30 mg via INTRAVENOUS
  Administered 2018-06-29: 20 mg via INTRAVENOUS

## 2018-06-29 MED ORDER — PHENYLEPHRINE 40 MCG/ML (10ML) SYRINGE FOR IV PUSH (FOR BLOOD PRESSURE SUPPORT)
PREFILLED_SYRINGE | INTRAVENOUS | Status: DC | PRN
  Administered 2018-06-29: 100 ug via INTRAVENOUS

## 2018-06-29 MED ORDER — LACTATED RINGERS IV SOLN
INTRAVENOUS | Status: DC
  Administered 2018-06-29: 1000 mL via INTRAVENOUS

## 2018-06-29 MED ORDER — SODIUM CHLORIDE 0.9 % IV SOLN: INTRAVENOUS | Status: DC

## 2018-06-29 MED ORDER — SODIUM CHLORIDE 0.9 % IV SOLN: 500.0000 mL | Freq: Once | INTRAVENOUS | Status: DC

## 2018-06-29 MED ORDER — PROPOFOL 10 MG/ML IV BOLUS
INTRAVENOUS | Status: AC
  Filled 2018-06-29: qty 20

## 2018-06-29 MED ORDER — PROPOFOL 500 MG/50ML IV EMUL
INTRAVENOUS | Status: DC | PRN
  Administered 2018-06-29: 75 ug/kg/min via INTRAVENOUS

## 2018-06-29 SURGICAL SUPPLY — 21 items

## 2018-06-29 NOTE — Anesthesia Preprocedure Evaluation (Signed)
Anesthesia Evaluation  Patient identified by MRN, date of birth, ID band Patient awake    Reviewed: Allergy & Precautions, NPO status , Patient's Chart, lab work & pertinent test results  Airway Mallampati: II  TM Distance: >3 FB Neck ROM: Full    Dental no notable dental hx.    Pulmonary sleep apnea , former smoker,    Pulmonary exam normal breath sounds clear to auscultation       Cardiovascular negative cardio ROS Normal cardiovascular exam Rhythm:Regular Rate:Normal     Neuro/Psych negative neurological ROS  negative psych ROS   GI/Hepatic Neg liver ROS, GERD  ,  Endo/Other  negative endocrine ROS  Renal/GU negative Renal ROS  negative genitourinary   Musculoskeletal negative musculoskeletal ROS (+)   Abdominal   Peds negative pediatric ROS (+)  Hematology negative hematology ROS (+)   Anesthesia Other Findings   Reproductive/Obstetrics negative OB ROS                             Anesthesia Physical Anesthesia Plan  ASA: II  Anesthesia Plan: MAC   Post-op Pain Management:    Induction: Intravenous  PONV Risk Score and Plan: 0  Airway Management Planned: Simple Face Mask  Additional Equipment:   Intra-op Plan:   Post-operative Plan:   Informed Consent: I have reviewed the patients History and Physical, chart, labs and discussed the procedure including the risks, benefits and alternatives for the proposed anesthesia with the patient or authorized representative who has indicated his/her understanding and acceptance.     Dental advisory given  Plan Discussed with: CRNA and Surgeon  Anesthesia Plan Comments:         Anesthesia Quick Evaluation

## 2018-06-29 NOTE — Interval H&P Note (Signed)
History and Physical Interval Note:  06/29/2018 10:33 AM  Chelsey Jackson  has presented today for surgery, with the diagnosis of screening.  The various methods of treatment have been discussed with the patient and family. After consideration of risks, benefits and other options for treatment, the patient has consented to  Procedure(s): COLONOSCOPY WITH PROPOFOL (N/A) as a surgical intervention.  The patient's history has been reviewed, patient examined, no change in status, stable for surgery.  Given the history of requiring pediatric equipment for intubation in the past, she was transferred from the [facility] to [facility] for colonoscopy completion in the hospital. I have reviewed the patient's chart and labs.  Questions were answered to the patient's satisfaction.     Dominic Pea Cirigliano

## 2018-06-29 NOTE — Progress Notes (Signed)
After patient was admitted, Anesthesia started talking with her and discovered that she has a history of being a difficult intubation. After reviewing her chart, it was decided for her to have her procedure at the hospital. She agreed and MD talked with her. Another MD at the hospital agreed to do her procedure and was able to see her now. He IV was removed and instructions on where to go were given. Patient and Husband understood and agreed.

## 2018-06-29 NOTE — Op Note (Signed)
[facility] Patient Name: Chelsey Jackson Procedure Date: 06/29/2018 MRN: [id number] Attending MD: Gerrit Heck , MD Date of Birth: 31-Aug-1950 CSN: [id number] Age: 68 Admit Type: Outpatient Procedure:                Colonoscopy Indications:              Screening for colorectal malignant neoplasm                           68 yo female with a history of colon polyps on                            initial colonoscopy (unsure of size, location,                            number, histology), with repeat colonoscopy in                            2008/2009 which was normal, with recommendation to                            repeat in 10 years for ongoing                            screening/surveillance (performed at outside                            facility). Otherwise, no family history of colon                            cancer. She does have a recent history of                            constipation, which has since resolved and back to                            baseline bowel habits. Providers:                Gerrit Heck, MD, Burtis Junes, RN, William Dalton, Technician Referring MD:              Medicines:                Monitored Anesthesia Care Complications:            No immediate complications. Estimated Blood Loss:     Estimated blood loss was minimal. Procedure:                Pre-Anesthesia Assessment:                           - Prior to the procedure, a History and Physical                            was performed, and patient medications and  allergies were reviewed. The patient's tolerance of                            previous anesthesia was also reviewed. The risks                            and benefits of the procedure and the sedation                            options and risks were discussed with the patient.                            All questions were answered, and informed consent                             was obtained. Prior Anticoagulants: The patient has                            taken no previous anticoagulant or antiplatelet                            agents. ASA Grade Assessment: II - A patient with                            mild systemic disease. After reviewing the risks                            and benefits, the patient was deemed in                            satisfactory condition to undergo the procedure.                           After obtaining informed consent, the colonoscope                            was passed under direct vision. Throughout the                            procedure, the patient's blood pressure, pulse, and                            oxygen saturations were monitored continuously. The                            CF-HQ190L ([id number]) Olympus colonoscope was                            introduced through the anus and advanced to the the                            terminal ileum. The colonoscopy was performed  without difficulty. The patient tolerated the                            procedure well. The quality of the bowel                            preparation was adequate. The terminal ileum,                            ileocecal valve, appendiceal orifice, and rectum                            were photographed. Scope In: 12:03:57 PM Scope Out: 12:36:02 PM Scope Withdrawal Time: 0 hours 14 minutes 56 seconds  Total Procedure Duration: 0 hours 32 minutes 5 seconds  Findings:      Skin tags were found on perianal exam.      Three sessile polyps were found in the ascending colon (x2) and cecum.       The polyps were 4 to 6 mm in size. These polyps were removed with a cold       snare. Resection and retrieval were complete. Estimated blood loss was       minimal.      Three sessile polyps were found in the sigmoid colon (x2) and descending       colon. The polyps were 2 to 5 mm in size. These polyps were removed  with       a cold snare for the larger 2 polyps and cold forceps for the smaller 2       mm sigmoid polyp. Resection and retrieval were complete.      Two sessile polyps were found in the rectum. The polyps were 2 to 3 mm       in size. These polyps were removed with a cold biopsy forceps. Resection       and retrieval were complete. Estimated blood loss was minimal.      A single small-mouthed diverticulum was found in the sigmoid colon.      The retroflexed view of the distal rectum and anal verge was normal and       showed no anal or rectal abnormalities.      The terminal ileum appeared normal. Impression:               - Perianal skin tags found on perianal exam.                           - Three 4 to 6 mm polyps in the ascending colon and                            in the cecum, removed with a cold snare. Resected                            and retrieved.                           - Three 2 to 5 mm polyps in the sigmoid colon and  in the descending colon, removed with a cold snare.                            Resected and retrieved.                           - Two 2 to 3 mm polyps in the rectum, removed with                            a cold biopsy forceps. Resected and retrieved.                           - Diverticulosis in the sigmoid colon.                           - The distal rectum and anal verge are normal on                            retroflexion view.                           - The examined portion of the ileum was normal. Moderate Sedation:      Not Applicable - Patient had care per Anesthesia. Recommendation:           - Patient has a contact number available for                            emergencies. The signs and symptoms of potential                            delayed complications were discussed with the                            patient. Return to normal activities tomorrow.                            Written discharge instructions  were provided to the                            patient.                           - Resume previous diet today.                           - Continue present medications.                           - Await pathology results.                           - Repeat colonoscopy for surveillance based on                            pathology results.                           -  Return to GI office PRN.                           - Use fiber, for example Citrucel, Fibercon, Konsyl                            or Metamucil. Procedure Code(s):        --- Professional ---                           [phone], Colonoscopy, flexible; with removal of                            tumor(s), polyp(s), or other lesion(s) by snare                            technique                           45380, 69, Colonoscopy, flexible; with biopsy,                            single or multiple Diagnosis Code(s):        --- Professional ---                           Z12.11, Encounter for screening for malignant                            neoplasm of colon                           D12.2, Benign neoplasm of ascending colon                           D12.0, Benign neoplasm of cecum                           D12.5, Benign neoplasm of sigmoid colon                           D12.4, Benign neoplasm of descending colon                           K62.1, Rectal polyp                           K64.4, Residual hemorrhoidal skin tags                           K57.30, Diverticulosis of large intestine without                            perforation or abscess without bleeding CPT copyright 2018 American Medical Association. All rights reserved. The codes documented in this report are preliminary and upon coder review may  be revised to meet current compliance requirements. Gerrit Heck, MD 06/29/2018 12:48:02 PM Number of Addenda:  0 

## 2018-06-29 NOTE — Discharge Instructions (Signed)
YOU HAD AN ENDOSCOPIC PROCEDURE TODAY: Refer to the procedure report and other information in the discharge instructions given to you for any specific questions about what was found during the examination. If this information does not answer your questions, please call [place] office at [phone] to clarify.  ° °YOU SHOULD EXPECT: Some feelings of bloating in the abdomen. Passage of more gas than usual. Walking can help get rid of the air that was put into your GI tract during the procedure and reduce the bloating. ° °DIET: Your first meal following the procedure should be a light meal and then it is ok to progress to your normal diet. A half-sandwich or bowl of soup is an example of a good first meal. Heavy or fried foods are harder to digest and may make you feel nauseous or bloated. Drink plenty of fluids but you should avoid alcoholic beverages for 24 hours. If you had a esophageal dilation, please see attached instructions for diet.   ° °ACTIVITY: Your care partner should take you home directly after the procedure. You should plan to take it easy, moving slowly for the rest of the day. You can resume normal activity the day after the procedure however YOU SHOULD NOT DRIVE, use power tools, machinery or perform tasks that involve climbing or major physical exertion for 24 hours (because of the sedation medicines used during the test).  ° °SYMPTOMS TO REPORT IMMEDIATELY: °A gastroenterologist can be reached at any hour. Please call [phone]  for any of the following symptoms:  °Following lower endoscopy (colonoscopy, flexible sigmoidoscopy) °Excessive amounts of blood in the stool  °Significant tenderness, worsening of abdominal pains  °Swelling of the abdomen that is new, acute  °Fever of 100° or higher  ° ° °FOLLOW UP:  °If any biopsies were taken you will be contacted by phone or by letter within the next 1-3 weeks. Call [phone]  if you have not heard about the biopsies in 3 weeks.  °Please also  call with any specific questions about appointments or follow up tests. ° °

## 2018-06-29 NOTE — Transfer of Care (Signed)
Immediate Anesthesia Transfer of Care Note  Patient: Chelsey Jackson  Procedure(s) Performed: Procedure(s): COLONOSCOPY WITH PROPOFOL (N/A) POLYPECTOMY  Patient Location: PACU  Anesthesia Type:MAC  Level of Consciousness:  sedated, patient cooperative and responds to stimulation  Airway & Oxygen Therapy:Patient Spontanous Breathing and Patient connected to face mask oxgen  Post-op Assessment:  Report given to PACU RN and Post -op Vital signs reviewed and stable  Post vital signs:  Reviewed and stable  Last Vitals:  Vitals:   06/29/18 1053 06/29/18 1240  BP: (!) 141/69 (!) 107/35  Pulse: 67 66  Resp: (!) 22 13  SpO2: 57% 972%    Complications: No apparent anesthesia complications

## 2018-06-29 NOTE — Anesthesia Postprocedure Evaluation (Signed)
Anesthesia Post Note  Patient: Chelsey Jackson  Procedure(s) Performed: COLONOSCOPY WITH PROPOFOL (N/A ) POLYPECTOMY     Patient location during evaluation: PACU Anesthesia Type: MAC Level of consciousness: awake and alert Pain management: pain level controlled Vital Signs Assessment: post-procedure vital signs reviewed and stable Respiratory status: spontaneous breathing, nonlabored ventilation, respiratory function stable and patient connected to nasal cannula oxygen Cardiovascular status: stable and blood pressure returned to baseline Postop Assessment: no apparent nausea or vomiting Anesthetic complications: no    Last Vitals:  Vitals:   06/29/18 1250 06/29/18 1258  BP: 118/63 (!) 124/54  Pulse: 72 63  Resp: 18 20  Temp:    SpO2: 92% 98%    Last Pain:  Vitals:   06/29/18 1258  TempSrc:   PainSc: 0-No pain                 Gresia Isidoro S

## 2018-07-05 DIAGNOSIS — H04121 Dry eye syndrome of right lacrimal gland: Secondary | ICD-10-CM | POA: Diagnosis not present

## 2018-07-06 DIAGNOSIS — J301 Allergic rhinitis due to pollen: Secondary | ICD-10-CM | POA: Diagnosis not present

## 2018-07-06 DIAGNOSIS — J3089 Other allergic rhinitis: Secondary | ICD-10-CM | POA: Diagnosis not present

## 2018-07-07 DIAGNOSIS — G4723 Circadian rhythm sleep disorder, irregular sleep wake type: Secondary | ICD-10-CM | POA: Diagnosis not present

## 2018-07-07 DIAGNOSIS — G4733 Obstructive sleep apnea (adult) (pediatric): Secondary | ICD-10-CM | POA: Diagnosis not present

## 2018-07-08 ENCOUNTER — Encounter: Payer: Self-pay | Admitting: Gastroenterology

## 2018-07-14 DIAGNOSIS — J301 Allergic rhinitis due to pollen: Secondary | ICD-10-CM | POA: Diagnosis not present

## 2018-07-14 DIAGNOSIS — J3089 Other allergic rhinitis: Secondary | ICD-10-CM | POA: Diagnosis not present

## 2018-07-19 DIAGNOSIS — J301 Allergic rhinitis due to pollen: Secondary | ICD-10-CM | POA: Diagnosis not present

## 2018-07-19 DIAGNOSIS — J3089 Other allergic rhinitis: Secondary | ICD-10-CM | POA: Diagnosis not present

## 2018-07-26 DIAGNOSIS — J3089 Other allergic rhinitis: Secondary | ICD-10-CM | POA: Diagnosis not present

## 2018-07-26 DIAGNOSIS — J301 Allergic rhinitis due to pollen: Secondary | ICD-10-CM | POA: Diagnosis not present

## 2018-08-02 ENCOUNTER — Ambulatory Visit (INDEPENDENT_AMBULATORY_CARE_PROVIDER_SITE_OTHER): Payer: BLUE CROSS/BLUE SHIELD | Admitting: Psychology

## 2018-08-02 DIAGNOSIS — F4323 Adjustment disorder with mixed anxiety and depressed mood: Secondary | ICD-10-CM

## 2018-08-03 DIAGNOSIS — J301 Allergic rhinitis due to pollen: Secondary | ICD-10-CM | POA: Diagnosis not present

## 2018-08-03 DIAGNOSIS — J3089 Other allergic rhinitis: Secondary | ICD-10-CM | POA: Diagnosis not present

## 2018-08-06 ENCOUNTER — Encounter: Payer: Self-pay | Admitting: Family Medicine

## 2018-08-09 ENCOUNTER — Ambulatory Visit (INDEPENDENT_AMBULATORY_CARE_PROVIDER_SITE_OTHER): Payer: BLUE CROSS/BLUE SHIELD | Admitting: Psychology

## 2018-08-09 DIAGNOSIS — J3089 Other allergic rhinitis: Secondary | ICD-10-CM | POA: Diagnosis not present

## 2018-08-09 DIAGNOSIS — F4323 Adjustment disorder with mixed anxiety and depressed mood: Secondary | ICD-10-CM

## 2018-08-09 DIAGNOSIS — J301 Allergic rhinitis due to pollen: Secondary | ICD-10-CM | POA: Diagnosis not present

## 2018-08-12 ENCOUNTER — Encounter (INDEPENDENT_AMBULATORY_CARE_PROVIDER_SITE_OTHER): Payer: Self-pay | Admitting: Orthopaedic Surgery

## 2018-08-12 ENCOUNTER — Other Ambulatory Visit (INDEPENDENT_AMBULATORY_CARE_PROVIDER_SITE_OTHER): Payer: Self-pay | Admitting: Orthopedic Surgery

## 2018-08-12 MED ORDER — DICLOFENAC SODIUM 1 % TD GEL: 2.0000 g | Freq: Four times a day (QID) | TRANSDERMAL | 2 refills | Status: DC

## 2018-08-16 ENCOUNTER — Ambulatory Visit (INDEPENDENT_AMBULATORY_CARE_PROVIDER_SITE_OTHER): Payer: BLUE CROSS/BLUE SHIELD | Admitting: Gastroenterology

## 2018-08-16 ENCOUNTER — Ambulatory Visit (INDEPENDENT_AMBULATORY_CARE_PROVIDER_SITE_OTHER): Payer: BLUE CROSS/BLUE SHIELD | Admitting: Psychology

## 2018-08-16 ENCOUNTER — Other Ambulatory Visit: Payer: Self-pay

## 2018-08-16 ENCOUNTER — Encounter: Payer: Self-pay | Admitting: Gastroenterology

## 2018-08-16 VITALS — Ht 68.0 in | Wt 190.0 lb

## 2018-08-16 DIAGNOSIS — F4323 Adjustment disorder with mixed anxiety and depressed mood: Secondary | ICD-10-CM

## 2018-08-16 DIAGNOSIS — K5730 Diverticulosis of large intestine without perforation or abscess without bleeding: Secondary | ICD-10-CM

## 2018-08-16 DIAGNOSIS — J301 Allergic rhinitis due to pollen: Secondary | ICD-10-CM | POA: Diagnosis not present

## 2018-08-16 DIAGNOSIS — R194 Change in bowel habit: Secondary | ICD-10-CM

## 2018-08-16 DIAGNOSIS — J3089 Other allergic rhinitis: Secondary | ICD-10-CM | POA: Diagnosis not present

## 2018-08-16 NOTE — Patient Instructions (Addendum)
- Follow a high fiber diet or use fiber supplements on a regular basis (Metamucil). - There is no need to avoid seeds, nuts, corn, and berries. - Non-steroidal antiinflammatory medications (such as ibuprofen, naproxen, etc) may increase your risk for diverticulitis, infection associated with diverticulosis. Please avoid these medications whenever possible.  - We should plan on another colonoscopy in 3 years given the number of polyps identified last month. - Call me with any questions or concern in the meantime. - I recommend the UpToDate website for further information about diverticulosis and polyps if you have further questions.   Thank you for your patience with me and our technology today! Please stay home, safe, and healthy. I look forward to meeting you in person in the future.    High-Fiber Diet Fiber, also called dietary fiber, is a type of carbohydrate that is found in fruits, vegetables, whole grains, and beans. A high-fiber diet can have many health benefits. Your health care provider may recommend a high-fiber diet to help:  Prevent constipation. Fiber can make your bowel movements more regular.  Lower your cholesterol.  Relieve the following conditions: ? Swelling of veins in the anus (hemorrhoids). ? Swelling and irritation (inflammation) of specific areas of the digestive tract (uncomplicated diverticulosis). ? A problem of the large intestine (colon) that sometimes causes pain and diarrhea (irritable bowel syndrome, IBS).  Prevent overeating as part of a weight-loss plan.  Prevent heart disease, type 2 diabetes, and certain cancers. What is my plan? The recommended daily fiber intake in grams (g) includes:  38 g for men age 76 or younger.  30 g for men over age 36.  88 g for women age 48 or younger.  21 g for women over age 59. You can get the recommended daily intake of dietary fiber by:  Eating a variety of fruits, vegetables, grains, and beans.  Taking a  fiber supplement, if it is not possible to get enough fiber through your diet. What do I need to know about a high-fiber diet?  It is better to get fiber through food sources rather than from fiber supplements. There is not a lot of research about how effective supplements are.  Always check the fiber content on the nutrition facts label of any prepackaged food. Look for foods that contain 5 g of fiber or more per serving.  Talk with a diet and nutrition specialist (dietitian) if you have questions about specific foods that are recommended or not recommended for your medical condition, especially if those foods are not listed below.  Gradually increase how much fiber you consume. If you increase your intake of dietary fiber too quickly, you may have bloating, cramping, or gas.  Drink plenty of water. Water helps you to digest fiber. What are tips for following this plan?  Eat a wide variety of high-fiber foods.  Make sure that half of the grains that you eat each day are whole grains.  Eat breads and cereals that are made with whole-grain flour instead of refined flour or white flour.  Eat brown rice, bulgur wheat, or millet instead of white rice.  Start the day with a breakfast that is high in fiber, such as a cereal that contains 5 g of fiber or more per serving.  Use beans in place of meat in soups, salads, and pasta dishes.  Eat high-fiber snacks, such as berries, raw vegetables, nuts, and popcorn.  Choose whole fruits and vegetables instead of processed forms like juice or sauce.  What foods can I eat?  Fruits Berries. Pears. Apples. Oranges. Avocado. Prunes and raisins. Dried figs. Vegetables Sweet potatoes. Spinach. Kale. Artichokes. Cabbage. Broccoli. Cauliflower. Green peas. Carrots. Squash. Grains Whole-grain breads. Multigrain cereal. Oats and oatmeal. Brown rice. Barley. Bulgur wheat. [place]. Quinoa. Bran muffins. Popcorn. Rye wafer crackers. Meats and other  proteins [place], kidney, and pinto beans. Soybeans. Split peas. Lentils. Nuts and seeds. Dairy Fiber-fortified yogurt. Beverages Fiber-fortified soy milk. Fiber-fortified orange juice. Other foods Fiber bars. The items listed above may not be a complete list of recommended foods and beverages. Contact a dietitian for more options. What foods are not recommended? Fruits Fruit juice. Cooked, strained fruit. Vegetables Fried potatoes. Canned vegetables. Well-cooked vegetables. Grains White bread. Pasta made with refined flour. White rice. Meats and other proteins Fatty cuts of meat. Fried chicken or fried fish. Dairy Milk. Yogurt. Cream cheese. Sour cream. Fats and oils Butters. Beverages Soft drinks. Other foods Cakes and pastries. The items listed above may not be a complete list of foods and beverages to avoid. Contact a dietitian for more information. Summary  Fiber is a type of carbohydrate. It is found in fruits, vegetables, whole grains, and beans.  There are many health benefits of eating a high-fiber diet, such as preventing constipation, lowering blood cholesterol, helping with weight loss, and reducing your risk of heart disease, diabetes, and certain cancers.  Gradually increase your intake of fiber. Increasing too fast can result in cramping, bloating, and gas. Drink plenty of water while you increase your fiber.  The best sources of fiber include whole fruits and vegetables, whole grains, nuts, seeds, and beans. This information is not intended to replace advice given to you by your health care provider. Make sure you discuss any questions you have with your health care provider. Document Released: 04/07/2005 Document Revised: 02/09/2017 Document Reviewed: 02/09/2017 Elsevier Interactive Patient Education  2019 [employer].

## 2018-08-16 NOTE — Progress Notes (Signed)
TELEHEALTH VISIT  Referring Provider: Briscoe Deutscher, DO Primary Care Physician:  Briscoe Deutscher, DO   Tele-visit due to COVID-19 pandemic Patient requested visit virtually, consented to the virtual encounter via telephone Contact made at: 9:30 08/16/18 Patient verified by name and date of birth Location of patient: Home Location provider: [place] [facility] Names of persons participating: Me, patient, Tinnie Gens CMA Time spent on telehealth visit: 18 minutes I discussed the limitations of evaluation and management by telemedicine. The patient expressed understanding and agreed to proceed.  Chief complaint:  Change in bowel habits   IMPRESSION:  Change in bowel habits - Constipation     -No alarm features History of colon polyps     - 78mm sessile serrated adenoma 04/05/03 [facility])    - no polyps on colonoscopy 03/11/2007 [facility])    - 6 tubular adenomas and SSAs removed on colonoscopy 06/30/18 ([facility])    - surveillance colonoscopy recommended in 2023 Sigmoid diverticulosis Family history of celiac disease (two daughters)    - patient with negative TTGA and IgA 05/2018 History of difficult to intubate (small mouth) EGD with Dr. Allyn Kenner 03/11/2007 for nausea. vomiting, abdominal pain: hiatal hernia, duodenal erosions, CLO neg  We reviewed her negative ttesting for celiac was negative with TTGA and IgA. No additional testing recommended.  Reassurance provided regarding the diagnosis of diverticulosis.  Reviewed recommendations to follow a high fiber diet or to use fiber supplements on a regular basis, which will also be helpful given her history of constipation.  However, there is no need to avoid seeds, corn, berries, and nuts. Using NSAIDs may be associated with a moderately increased risk of occurrence of any episode of diverticulitis and complicated diverticulitis. Could consider surgical consultation for possible resection with recurrent symptoms.   Reviewed her  pathology results regarding tubular adenomas and sessile serrated adenomas, as well as her results from Dr. Allyn Kenner. Surveillance recommendations discussed. All questions were answered to her satisfaction.  PLAN: High fiber diet recommended Avoid all NSAIDs Given the number of polyps removed, surveillance colonoscopy recommended in 2023 Return to the office as needed before then     HPI: Chelsey Jackson is a 69 y.o. female seen in consult 05/31/18 for a change in bowel habits.  No longer have any constipation. She feels this was most likely related to stress associated with travel around her father's death. Testing for celiac was negative with TTGA and IgA.   I reviewed her colonoscopy report and associated pathology report from 06/30/18 that revealed 6 tubular adenomas and sessile serrated adenomas. Sigmoid diverticulosis was present.  She is concerned today because she saw something in MyChart that said diverticulitis with hemorrhage. Although I am unable to find any documentation that speaks to this. When we reviewed her concerns about this language together, it became clear that it was the diagnosis code that says "diverticulosis...without hemorrhage."   Today I also reviewed prior records from Dr. Allyn Kenner showing:  - Colonoscopy with Dr. Allyn Kenner 04/05/03: 51mm serrated adenoma sigmoid polyp (at 25cm). Polyp was 62mm per pathology report.   Surveillance recommended in 3 years.  - Colonoscopy 03/11/2007: normal. Surveillance recommended in 5 years. - EGD 03/11/07 for nausea. vomiting, abdominal pain: hiatal hernia, duodenal erosions, CLO neg   Past Medical History:  Diagnosis Date  . 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 Allergy    weekly allergy shots  . Anxiety   . Arthritis   .  Asthma   . Complication of anesthesia    difficult intubation  . Depression   . Fibrocystic breast disease   . GERD (gastroesophageal reflux  disease)   . OSA (obstructive sleep apnea) 07/31/2013  . Peptic ulcer disease   . TMJ locking     Past Surgical History:  Procedure Laterality Date  . ABDOMINAL HYSTERECTOMY  2003   due to fibroids  . ADENOIDECTOMY     x3  . BIOPSY  06/29/2018   Procedure: BIOPSY;  Surgeon: Lavena Bullion, DO;  Location: [facility];  Service: Gastroenterology;;  . BREAST CYST ASPIRATION    . COLONOSCOPY  03/10/2007   results-normal  . COLONOSCOPY WITH PROPOFOL N/A 06/29/2018   Procedure: COLONOSCOPY WITH PROPOFOL;  Surgeon: Lavena Bullion, DO;  Location: [facility];  Service: Gastroenterology;  Laterality: N/A;  . HAMMER TOE SURGERY    . POLYPECTOMY  06/29/2018   Procedure: POLYPECTOMY;  Surgeon: Lavena Bullion, DO;  Location: [facility];  Service: Gastroenterology;;  . Lavonda Jumbo  '90's   for incontinence  . RHINOPLASTY  1984  . ROTATOR CUFF REPAIR Right 2013   [place]  . ROTATOR CUFF REPAIR Left 2012   [place]  . SHOULDER SURGERY    . TONSILLECTOMY     x2  . TUBAL LIGATION    . [place]  . WRIST SURGERY      Current Outpatient Medications  Medication Sig Dispense Refill  . ARIPiprazole (ABILIFY) 2 MG tablet     . buPROPion (WELLBUTRIN XL) 300 MG 24 hr tablet Take 300 mg by mouth daily.    . Calcium Carbonate-Vitamin D (CALTRATE 600+D) 600-400 MG-UNIT per tablet Take 1 tablet by mouth daily.      . diclofenac sodium (VOLTAREN) 1 % GEL Apply 2-4 g topically 4 (four) times daily. 6 Tube 2  . EPINEPHrine 0.3 mg/0.3 mL IJ SOAJ injection Inject into the muscle once.    . fluorometholone (FML) 0.1 % ophthalmic suspension     . fluticasone (FLONASE) 50 MCG/ACT nasal spray Place 2 sprays into both nostrils daily.     . Iron-Vitamin C (VITRON-C) 65-125 MG TABS Take 1 tablet by mouth daily.    Marland Kitchen levocetirizine (XYZAL) 5 MG tablet Take 5 mg by mouth every evening.    . Multiple Vitamin (MULTIVITAMIN) tablet Take 1 tablet by mouth daily.      Marland Kitchen PROAIR  RESPICLICK 810 (90 Base) MCG/ACT AEPB as needed.     . temazepam (RESTORIL) 30 MG capsule Take 30 mg by mouth at bedtime as needed for sleep.     Current Facility-Administered Medications  Medication Dose Route Frequency Provider Last Rate Last Dose  . 0.9 %  sodium chloride infusion  500 mL Intravenous Once [place], MD        Allergies as of 08/16/2018 - Review Complete 08/16/2018  Allergen Reaction Noted  . Penicillins Anaphylaxis 04/06/2016    Family History  Problem Relation Age of Onset  . Osteoporosis Mother   . Fibroids Mother   . Other Mother        aortic valve replacement 1994  . Lung cancer Mother 99  . Heart disease Father   . Testicular cancer Father 67  . Prostate cancer Father 22  . Diabetes Maternal Grandmother   . Heart disease Maternal Grandfather   . Heart disease Paternal Grandmother   . Diabetes Paternal Grandfather   . Stomach cancer Paternal Grandfather   . Breast cancer Paternal Aunt   .  Breast cancer Cousin   . Colon cancer Neg Hx   . Throat cancer Neg Hx   . Pancreatic cancer Neg Hx     Social History   Socioeconomic History  . Marital status: Married    Spouse name: Not on file  . Number of children: Not on file  . Years of education: Not on file  . Highest education level: Not on file  Occupational History  . Not on file  Social Needs  . Financial resource strain: Not on file  . Food insecurity:    Worry: Not on file    Inability: Not on file  . Transportation needs:    Medical: Not on file    Non-medical: Not on file  Tobacco Use  . Smoking status: Former Smoker    Packs/day: 2.00    Years: 15.00    Pack years: 30.00    Types: Cigarettes    Last attempt to quit: 11/19/1989    Years since quitting: 28.7  . Smokeless tobacco: Never Used  Substance and Sexual Activity  . Alcohol use: Yes    Comment: maybe a glass of wine on the weekend  . Drug use: No  . Sexual activity: Not on file  Lifestyle  . Physical activity:     Days per week: Not on file    Minutes per session: Not on file  . Stress: Not on file  Relationships  . Social connections:    Talks on phone: Not on file    Gets together: Not on file    Attends religious service: Not on file    Active member of club or organization: Not on file    Attends meetings of clubs or organizations: Not on file    Relationship status: Not on file  . Intimate partner violence:    Fear of current or ex partner: Not on file    Emotionally abused: Not on file    Physically abused: Not on file    Forced sexual activity: Not on file  Other Topics Concern  . 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  Physical Exam: General: in no acute distress Neuro: Alert and appropriate Psych: Normal affect and normal insight   Latravious Levitt L. Tarri Glenn, MD, MPH [facility] 08/16/2018, 8:52 AM

## 2018-08-19 DIAGNOSIS — G4723 Circadian rhythm sleep disorder, irregular sleep wake type: Secondary | ICD-10-CM | POA: Diagnosis not present

## 2018-08-23 DIAGNOSIS — J3089 Other allergic rhinitis: Secondary | ICD-10-CM | POA: Diagnosis not present

## 2018-08-23 DIAGNOSIS — J301 Allergic rhinitis due to pollen: Secondary | ICD-10-CM | POA: Diagnosis not present

## 2018-08-24 ENCOUNTER — Ambulatory Visit (INDEPENDENT_AMBULATORY_CARE_PROVIDER_SITE_OTHER): Payer: BLUE CROSS/BLUE SHIELD | Admitting: Psychology

## 2018-08-24 DIAGNOSIS — F4323 Adjustment disorder with mixed anxiety and depressed mood: Secondary | ICD-10-CM | POA: Diagnosis not present

## 2018-08-30 DIAGNOSIS — J301 Allergic rhinitis due to pollen: Secondary | ICD-10-CM | POA: Diagnosis not present

## 2018-08-30 DIAGNOSIS — J3089 Other allergic rhinitis: Secondary | ICD-10-CM | POA: Diagnosis not present

## 2018-09-06 DIAGNOSIS — J301 Allergic rhinitis due to pollen: Secondary | ICD-10-CM | POA: Diagnosis not present

## 2018-09-06 DIAGNOSIS — J3089 Other allergic rhinitis: Secondary | ICD-10-CM | POA: Diagnosis not present

## 2018-09-07 DIAGNOSIS — F332 Major depressive disorder, recurrent severe without psychotic features: Secondary | ICD-10-CM | POA: Diagnosis not present

## 2018-09-08 ENCOUNTER — Ambulatory Visit (INDEPENDENT_AMBULATORY_CARE_PROVIDER_SITE_OTHER): Payer: BLUE CROSS/BLUE SHIELD | Admitting: Psychology

## 2018-09-08 DIAGNOSIS — F4323 Adjustment disorder with mixed anxiety and depressed mood: Secondary | ICD-10-CM

## 2018-09-10 DIAGNOSIS — J301 Allergic rhinitis due to pollen: Secondary | ICD-10-CM | POA: Diagnosis not present

## 2018-09-10 DIAGNOSIS — J3089 Other allergic rhinitis: Secondary | ICD-10-CM | POA: Diagnosis not present

## 2018-09-14 DIAGNOSIS — J3089 Other allergic rhinitis: Secondary | ICD-10-CM | POA: Diagnosis not present

## 2018-09-14 DIAGNOSIS — J301 Allergic rhinitis due to pollen: Secondary | ICD-10-CM | POA: Diagnosis not present

## 2018-09-18 DIAGNOSIS — G4733 Obstructive sleep apnea (adult) (pediatric): Secondary | ICD-10-CM | POA: Diagnosis not present

## 2018-09-18 DIAGNOSIS — G4723 Circadian rhythm sleep disorder, irregular sleep wake type: Secondary | ICD-10-CM | POA: Diagnosis not present

## 2018-09-21 DIAGNOSIS — J3089 Other allergic rhinitis: Secondary | ICD-10-CM | POA: Diagnosis not present

## 2018-09-21 DIAGNOSIS — J301 Allergic rhinitis due to pollen: Secondary | ICD-10-CM | POA: Diagnosis not present

## 2018-09-22 ENCOUNTER — Ambulatory Visit (INDEPENDENT_AMBULATORY_CARE_PROVIDER_SITE_OTHER): Payer: BC Managed Care – PPO | Admitting: Psychology

## 2018-09-22 DIAGNOSIS — F4323 Adjustment disorder with mixed anxiety and depressed mood: Secondary | ICD-10-CM

## 2018-09-23 DIAGNOSIS — J301 Allergic rhinitis due to pollen: Secondary | ICD-10-CM | POA: Diagnosis not present

## 2018-09-23 DIAGNOSIS — J3089 Other allergic rhinitis: Secondary | ICD-10-CM | POA: Diagnosis not present

## 2018-09-23 DIAGNOSIS — J4520 Mild intermittent asthma, uncomplicated: Secondary | ICD-10-CM | POA: Diagnosis not present

## 2018-09-23 DIAGNOSIS — J3081 Allergic rhinitis due to animal (cat) (dog) hair and dander: Secondary | ICD-10-CM | POA: Diagnosis not present

## 2018-09-24 ENCOUNTER — Other Ambulatory Visit: Payer: Self-pay | Admitting: Obstetrics & Gynecology

## 2018-09-24 DIAGNOSIS — Z1231 Encounter for screening mammogram for malignant neoplasm of breast: Secondary | ICD-10-CM

## 2018-09-27 DIAGNOSIS — J301 Allergic rhinitis due to pollen: Secondary | ICD-10-CM | POA: Diagnosis not present

## 2018-09-27 DIAGNOSIS — J3089 Other allergic rhinitis: Secondary | ICD-10-CM | POA: Diagnosis not present

## 2018-09-29 DIAGNOSIS — J3089 Other allergic rhinitis: Secondary | ICD-10-CM | POA: Diagnosis not present

## 2018-09-29 DIAGNOSIS — J301 Allergic rhinitis due to pollen: Secondary | ICD-10-CM | POA: Diagnosis not present

## 2018-10-04 DIAGNOSIS — J3089 Other allergic rhinitis: Secondary | ICD-10-CM | POA: Diagnosis not present

## 2018-10-04 DIAGNOSIS — J301 Allergic rhinitis due to pollen: Secondary | ICD-10-CM | POA: Diagnosis not present

## 2018-10-06 ENCOUNTER — Ambulatory Visit (INDEPENDENT_AMBULATORY_CARE_PROVIDER_SITE_OTHER): Payer: BC Managed Care – PPO | Admitting: Psychology

## 2018-10-06 DIAGNOSIS — F4323 Adjustment disorder with mixed anxiety and depressed mood: Secondary | ICD-10-CM

## 2018-10-07 DIAGNOSIS — M545 Low back pain: Secondary | ICD-10-CM | POA: Diagnosis not present

## 2018-10-11 DIAGNOSIS — J301 Allergic rhinitis due to pollen: Secondary | ICD-10-CM | POA: Diagnosis not present

## 2018-10-11 DIAGNOSIS — J3089 Other allergic rhinitis: Secondary | ICD-10-CM | POA: Diagnosis not present

## 2018-10-13 DIAGNOSIS — J3089 Other allergic rhinitis: Secondary | ICD-10-CM | POA: Diagnosis not present

## 2018-10-13 DIAGNOSIS — J301 Allergic rhinitis due to pollen: Secondary | ICD-10-CM | POA: Diagnosis not present

## 2018-10-18 DIAGNOSIS — G4723 Circadian rhythm sleep disorder, irregular sleep wake type: Secondary | ICD-10-CM | POA: Diagnosis not present

## 2018-10-18 DIAGNOSIS — M545 Low back pain: Secondary | ICD-10-CM | POA: Diagnosis not present

## 2018-10-18 DIAGNOSIS — G4733 Obstructive sleep apnea (adult) (pediatric): Secondary | ICD-10-CM | POA: Diagnosis not present

## 2018-10-20 ENCOUNTER — Ambulatory Visit (INDEPENDENT_AMBULATORY_CARE_PROVIDER_SITE_OTHER): Payer: BC Managed Care – PPO | Admitting: Psychology

## 2018-10-20 DIAGNOSIS — F4323 Adjustment disorder with mixed anxiety and depressed mood: Secondary | ICD-10-CM | POA: Diagnosis not present

## 2018-10-28 DIAGNOSIS — M545 Low back pain: Secondary | ICD-10-CM | POA: Diagnosis not present

## 2018-11-03 ENCOUNTER — Ambulatory Visit (INDEPENDENT_AMBULATORY_CARE_PROVIDER_SITE_OTHER): Payer: BC Managed Care – PPO | Admitting: Psychology

## 2018-11-03 DIAGNOSIS — F4323 Adjustment disorder with mixed anxiety and depressed mood: Secondary | ICD-10-CM | POA: Diagnosis not present

## 2018-11-03 NOTE — Progress Notes (Signed)
Subjective:    Chelsey Jackson is a 68 y.o. female and is here for a comprehensive physical exam.  Since our last visit, patient's daughter got married 03/30/18) and her father died April 29, 2018). In therapy. She has been making masks for friends and family due to [place]. Not on Medicare yet as husband still working.   Saw Urology for incontinence. Status post PT, drug trial, botox without improvement. Now going to [facility] for chronic LBP and incontinence. Has had four sessions.   Health Maintenance Due  Topic Date Due  . PNA vac Low Risk Adult (1 of 2 - PCV13) 12/11/2017   Current Outpatient Medications:  .  buPROPion (WELLBUTRIN XL) 300 MG 24 hr tablet, Take 300 mg by mouth daily., Disp: , Rfl:  .  Calcium Carbonate-Vitamin D (CALTRATE 600+D) 600-400 MG-UNIT per tablet, Take 1 tablet by mouth daily.  , Disp: , Rfl:  .  diclofenac sodium (VOLTAREN) 1 % GEL, Apply 2-4 g topically 4 (four) times daily., Disp: 6 Tube, Rfl: 2 .  EPINEPHrine 0.3 mg/0.3 mL IJ SOAJ injection, Inject into the muscle once., Disp: , Rfl:  .  fluticasone (FLONASE) 50 MCG/ACT nasal spray, Place 2 sprays into both nostrils daily. , Disp: , Rfl:  .  Iron-Vitamin C (VITRON-C) 65-125 MG TABS, Take 1 tablet by mouth daily., Disp: , Rfl:  .  levocetirizine (XYZAL) 5 MG tablet, Take 5 mg by mouth every evening., Disp: , Rfl:  .  Multiple Vitamin (MULTIVITAMIN) tablet, Take 1 tablet by mouth daily.  , Disp: , Rfl:  .  PROAIR RESPICLICK 824 (90 Base) MCG/ACT AEPB, as needed. , Disp: , Rfl:  .  temazepam (RESTORIL) 30 MG capsule, Take 30 mg by mouth at bedtime as needed for sleep., Disp: , Rfl:   Current Facility-Administered Medications:  .  0.9 %  sodium chloride infusion, 500 mL, Intravenous, Once, Thornton Park, MD  PMHx, SurgHx, SocialHx, Medications, and Allergies were reviewed in the Visit Navigator and updated as appropriate.   Past Medical History:  Diagnosis Date  . AC (acromioclavicular)  joint bone spurs    left shoulder with chronic pain, w/limitation ROM  . Allergy    weekly allergy shots  . Anxiety   . Arthritis   . Asthma   . Complication of anesthesia    difficult intubation  . Depression   . Fibrocystic breast disease   . GERD (gastroesophageal reflux disease)   . OSA (obstructive sleep apnea) 07/31/2013  . Peptic ulcer disease   . TMJ locking      Past Surgical History:  Procedure Laterality Date  . ABDOMINAL HYSTERECTOMY  2003   due to fibroids  . ADENOIDECTOMY     x3  . BIOPSY  06/29/2018   Procedure: BIOPSY;  Surgeon: Lavena Bullion, DO;  [facility];  Service: Gastroenterology;;  . BREAST CYST ASPIRATION    . COLONOSCOPY  03/10/2007   results-normal  . COLONOSCOPY WITH PROPOFOL N/A 06/29/2018   Procedure: COLONOSCOPY WITH PROPOFOL;  Surgeon: Lavena Bullion, DO;  [facility];  Service: Gastroenterology;  Laterality: N/A;  . HAMMER TOE SURGERY    . POLYPECTOMY  06/29/2018   Procedure: POLYPECTOMY;  Surgeon: Lavena Bullion, DO;  [facility];  Service: Gastroenterology;;  . Lavonda Jumbo  '90's   for incontinence  . RHINOPLASTY  1984  . ROTATOR CUFF REPAIR Right 2013   [place]  . ROTATOR CUFF REPAIR Left 2012   [place]  .  SHOULDER SURGERY    . TONSILLECTOMY     x2  . TUBAL LIGATION    . [place]  . WRIST SURGERY       Family History  Problem Relation Age of Onset  . Osteoporosis Mother   . Fibroids Mother   . Other Mother        aortic valve replacement 1994  . Lung cancer Mother 28  . Heart disease Father   . Testicular cancer Father 68  . Prostate cancer Father 66  . Diabetes Maternal Grandmother   . Heart disease Maternal Grandfather   . Heart disease Paternal Grandmother   . Diabetes Paternal Grandfather   . Stomach cancer Paternal Grandfather   . Breast cancer Paternal Aunt   . Breast cancer Cousin   . Colon cancer Neg Hx   . Throat cancer Neg Hx   .  Pancreatic cancer Neg Hx     Social History   Tobacco Use  . Smoking status: Former Smoker    Packs/day: 2.00    Years: 15.00    Pack years: 30.00    Types: Cigarettes    Quit date: 11/19/1989    Years since quitting: 28.9  . Smokeless tobacco: Never Used  Substance Use Topics  . Alcohol use: Yes    Comment: maybe a glass of wine on the weekend  . Drug use: No   Review of Systems:   Pertinent items are noted in the HPI. Otherwise, ROS is negative.  Objective:   BP 140/80 (BP Location: Left Arm, Patient Position: Sitting, Cuff Size: Normal)   Pulse 81   Temp 98.9 F (37.2 C) (Oral)   Ht 5\' 8"  (1.727 m)   Wt 195 lb (88.5 kg)   LMP  (LMP Unknown)   SpO2 97%   BMI 29.65 kg/m   General appearance: alert, cooperative and appears stated age. Head: normocephalic, without obvious abnormality, atraumatic. Neck: no adenopathy, supple, symmetrical, trachea midline; thyroid not enlarged, symmetric, no tenderness/mass/nodules. Lungs: clear to auscultation bilaterally. Heart: regular rate and rhythm Abdomen: soft, non-tender; no masses,  no organomegaly. Extremities: extremities normal, atraumatic, no cyanosis or edema. Skin: skin color, texture, turgor normal, no rashes or lesions. Lymph: cervical, supraclavicular, and axillary nodes normal; no abnormal inguinal nodes palpated. Neurologic: grossly normal.  EKG: there are no previous tracings available for comparison, normal sinus rhythm.   Assessment/Plan:   [name] was seen today for annual exam.  Diagnoses and all orders for this visit:  Routine physical examination  Medication management -     EKG 12-Lead -     CBC with Differential/Platelet -     Comprehensive metabolic panel  OSA (obstructive sleep apnea)  Chronic low back pain without sciatica, unspecified back pain laterality  Anxiety and depression  Urge incontinence  Mixed hyperlipidemia -     Lipid panel   Patient Counseling: [x]    Nutrition:  Stressed importance of moderation in sodium/caffeine intake, saturated fat and cholesterol, caloric balance, sufficient intake of fresh fruits, vegetables, fiber, calcium, iron, and 1 mg of folate supplement per day (for females capable of pregnancy).  [x]    Stressed the importance of regular exercise.   [x]    Substance Abuse: Discussed cessation/primary prevention of tobacco, alcohol, or other drug use; driving or other dangerous activities under the influence; availability of treatment for abuse.   [x]    Injury prevention: Discussed safety belts, safety helmets, smoke detector, smoking near bedding or upholstery.   [x]    Sexuality:  Discussed sexually transmitted diseases, partner selection, use of condoms, avoidance of unintended pregnancy  and contraceptive alternatives.  [x]    Dental health: Discussed importance of regular tooth brushing, flossing, and dental visits.  [x]    Health maintenance and immunizations reviewed. Please refer to Health maintenance section.   Briscoe Deutscher, DO [place]

## 2018-11-05 ENCOUNTER — Other Ambulatory Visit: Payer: Self-pay

## 2018-11-05 ENCOUNTER — Encounter: Payer: Self-pay | Admitting: Family Medicine

## 2018-11-05 ENCOUNTER — Ambulatory Visit (INDEPENDENT_AMBULATORY_CARE_PROVIDER_SITE_OTHER): Payer: BC Managed Care – PPO | Admitting: Family Medicine

## 2018-11-05 VITALS — BP 140/80 | HR 81 | Temp 98.9°F | Ht 68.0 in | Wt 195.0 lb

## 2018-11-05 DIAGNOSIS — E782 Mixed hyperlipidemia: Secondary | ICD-10-CM | POA: Diagnosis not present

## 2018-11-05 DIAGNOSIS — Z79899 Other long term (current) drug therapy: Secondary | ICD-10-CM | POA: Diagnosis not present

## 2018-11-05 DIAGNOSIS — M545 Low back pain, unspecified: Secondary | ICD-10-CM

## 2018-11-05 DIAGNOSIS — G4733 Obstructive sleep apnea (adult) (pediatric): Secondary | ICD-10-CM | POA: Diagnosis not present

## 2018-11-05 DIAGNOSIS — F32A Depression, unspecified: Secondary | ICD-10-CM

## 2018-11-05 DIAGNOSIS — Z23 Encounter for immunization: Secondary | ICD-10-CM

## 2018-11-05 DIAGNOSIS — Z0000 Encounter for general adult medical examination without abnormal findings: Secondary | ICD-10-CM | POA: Diagnosis not present

## 2018-11-05 DIAGNOSIS — F329 Major depressive disorder, single episode, unspecified: Secondary | ICD-10-CM

## 2018-11-05 DIAGNOSIS — F419 Anxiety disorder, unspecified: Secondary | ICD-10-CM

## 2018-11-05 DIAGNOSIS — G8929 Other chronic pain: Secondary | ICD-10-CM

## 2018-11-05 DIAGNOSIS — N3941 Urge incontinence: Secondary | ICD-10-CM | POA: Insufficient documentation

## 2018-11-05 LAB — COMPREHENSIVE METABOLIC PANEL
ALT: 25 U/L (ref 0–35)
AST: 18 U/L (ref 0–37)
Albumin: 4.5 g/dL (ref 3.5–5.2)
Alkaline Phosphatase: 54 U/L (ref 39–117)
BUN: 22 mg/dL (ref 6–23)
CO2: 28 mEq/L (ref 19–32)
Calcium: 9.3 mg/dL (ref 8.4–10.5)
Chloride: 105 mEq/L (ref 96–112)
Creatinine, Ser: 0.95 mg/dL (ref 0.40–1.20)
GFR: 58.87 mL/min — ABNORMAL LOW (ref >60.00)
Glucose, Bld: 91 mg/dL (ref 70–99)
Potassium: 4.2 mEq/L (ref 3.5–5.1)
Sodium: 140 mEq/L (ref 135–145)
Total Bilirubin: 0.4 mg/dL (ref 0.2–1.2)
Total Protein: 6.7 g/dL (ref 6.0–8.3)

## 2018-11-05 LAB — CBC WITH DIFFERENTIAL/PLATELET
Basophils Absolute: 0 10*3/uL (ref 0.0–0.1)
Basophils Relative: 0.6 % (ref 0.0–3.0)
Eosinophils Absolute: 0.1 10*3/uL (ref 0.0–0.7)
Eosinophils Relative: 1.7 % (ref 0.0–5.0)
HCT: 36.6 % (ref 36.0–46.0)
Hemoglobin: 12.3 g/dL (ref 12.0–15.0)
Lymphocytes Relative: 22.1 % (ref 12.0–46.0)
Lymphs Abs: 1.3 10*3/uL (ref 0.7–4.0)
MCHC: 33.7 g/dL (ref 30.0–36.0)
MCV: 82.9 fl (ref 78.0–100.0)
Monocytes Absolute: 0.5 10*3/uL (ref 0.1–1.0)
Monocytes Relative: 9 % (ref 3.0–12.0)
Neutro Abs: 3.9 10*3/uL (ref 1.4–7.7)
Neutrophils Relative %: 66.6 % (ref 43.0–77.0)
Platelets: 241 10*3/uL (ref 150.0–400.0)
RBC: 4.42 Mil/uL (ref 3.87–5.11)
RDW: 14.3 % (ref 11.5–15.5)
WBC: 5.9 10*3/uL (ref 4.0–10.5)

## 2018-11-05 LAB — LIPID PANEL
Cholesterol: 188 mg/dL (ref 0–200)
HDL: 32 mg/dL — ABNORMAL LOW (ref >39.00)
LDL Cholesterol: 124 mg/dL — ABNORMAL HIGH (ref 0–99)
NonHDL: 156.28
Total CHOL/HDL Ratio: 6
Triglycerides: 161 mg/dL — ABNORMAL HIGH (ref 0.0–149.0)
VLDL: 32.2 mg/dL (ref 0.0–40.0)

## 2018-11-08 DIAGNOSIS — J301 Allergic rhinitis due to pollen: Secondary | ICD-10-CM | POA: Diagnosis not present

## 2018-11-08 DIAGNOSIS — J3089 Other allergic rhinitis: Secondary | ICD-10-CM | POA: Diagnosis not present

## 2018-11-09 ENCOUNTER — Ambulatory Visit: Payer: BLUE CROSS/BLUE SHIELD

## 2018-11-10 ENCOUNTER — Ambulatory Visit: Payer: BLUE CROSS/BLUE SHIELD

## 2018-11-17 ENCOUNTER — Ambulatory Visit: Payer: BC Managed Care – PPO | Admitting: Psychology

## 2018-11-17 DIAGNOSIS — G4733 Obstructive sleep apnea (adult) (pediatric): Secondary | ICD-10-CM | POA: Diagnosis not present

## 2018-11-17 DIAGNOSIS — G4723 Circadian rhythm sleep disorder, irregular sleep wake type: Secondary | ICD-10-CM | POA: Diagnosis not present

## 2018-11-29 ENCOUNTER — Ambulatory Visit
Admission: RE | Admit: 2018-11-29 | Discharge: 2018-11-29 | Disposition: A | Discharge status: Home / Self Care | Payer: BC Managed Care – PPO | Source: Ambulatory Visit | Attending: Obstetrics & Gynecology | Admitting: Obstetrics & Gynecology

## 2018-11-29 ENCOUNTER — Other Ambulatory Visit: Payer: Self-pay

## 2018-11-29 DIAGNOSIS — Z1231 Encounter for screening mammogram for malignant neoplasm of breast: Secondary | ICD-10-CM | POA: Diagnosis not present

## 2018-12-01 ENCOUNTER — Ambulatory Visit (INDEPENDENT_AMBULATORY_CARE_PROVIDER_SITE_OTHER): Payer: BC Managed Care – PPO | Admitting: Psychology

## 2018-12-01 DIAGNOSIS — F4323 Adjustment disorder with mixed anxiety and depressed mood: Secondary | ICD-10-CM | POA: Diagnosis not present

## 2018-12-06 DIAGNOSIS — J301 Allergic rhinitis due to pollen: Secondary | ICD-10-CM | POA: Diagnosis not present

## 2018-12-06 DIAGNOSIS — J3089 Other allergic rhinitis: Secondary | ICD-10-CM | POA: Diagnosis not present

## 2018-12-07 DIAGNOSIS — F332 Major depressive disorder, recurrent severe without psychotic features: Secondary | ICD-10-CM | POA: Diagnosis not present

## 2018-12-09 DIAGNOSIS — N3281 Overactive bladder: Secondary | ICD-10-CM | POA: Diagnosis not present

## 2018-12-09 DIAGNOSIS — Z01419 Encounter for gynecological examination (general) (routine) without abnormal findings: Secondary | ICD-10-CM | POA: Diagnosis not present

## 2018-12-10 ENCOUNTER — Other Ambulatory Visit: Payer: Self-pay | Admitting: Obstetrics & Gynecology

## 2018-12-10 DIAGNOSIS — E2839 Other primary ovarian failure: Secondary | ICD-10-CM

## 2018-12-10 DIAGNOSIS — R32 Unspecified urinary incontinence: Secondary | ICD-10-CM | POA: Diagnosis not present

## 2018-12-15 ENCOUNTER — Ambulatory Visit (INDEPENDENT_AMBULATORY_CARE_PROVIDER_SITE_OTHER): Payer: BC Managed Care – PPO | Admitting: Psychology

## 2018-12-15 DIAGNOSIS — F4323 Adjustment disorder with mixed anxiety and depressed mood: Secondary | ICD-10-CM | POA: Diagnosis not present

## 2018-12-17 DIAGNOSIS — G4723 Circadian rhythm sleep disorder, irregular sleep wake type: Secondary | ICD-10-CM | POA: Diagnosis not present

## 2018-12-20 DIAGNOSIS — R32 Unspecified urinary incontinence: Secondary | ICD-10-CM | POA: Diagnosis not present

## 2018-12-29 ENCOUNTER — Ambulatory Visit (INDEPENDENT_AMBULATORY_CARE_PROVIDER_SITE_OTHER): Payer: BC Managed Care – PPO | Admitting: Psychology

## 2018-12-29 DIAGNOSIS — F4323 Adjustment disorder with mixed anxiety and depressed mood: Secondary | ICD-10-CM

## 2018-12-31 DIAGNOSIS — R32 Unspecified urinary incontinence: Secondary | ICD-10-CM | POA: Diagnosis not present

## 2019-01-03 DIAGNOSIS — J301 Allergic rhinitis due to pollen: Secondary | ICD-10-CM | POA: Diagnosis not present

## 2019-01-03 DIAGNOSIS — J3089 Other allergic rhinitis: Secondary | ICD-10-CM | POA: Diagnosis not present

## 2019-01-06 DIAGNOSIS — R32 Unspecified urinary incontinence: Secondary | ICD-10-CM | POA: Diagnosis not present

## 2019-01-12 ENCOUNTER — Ambulatory Visit (INDEPENDENT_AMBULATORY_CARE_PROVIDER_SITE_OTHER): Payer: BC Managed Care – PPO | Admitting: Psychology

## 2019-01-12 DIAGNOSIS — F4323 Adjustment disorder with mixed anxiety and depressed mood: Secondary | ICD-10-CM

## 2019-01-17 DIAGNOSIS — G4733 Obstructive sleep apnea (adult) (pediatric): Secondary | ICD-10-CM | POA: Diagnosis not present

## 2019-01-17 DIAGNOSIS — G4723 Circadian rhythm sleep disorder, irregular sleep wake type: Secondary | ICD-10-CM | POA: Diagnosis not present

## 2019-01-26 ENCOUNTER — Ambulatory Visit (INDEPENDENT_AMBULATORY_CARE_PROVIDER_SITE_OTHER): Payer: Medicare Other | Admitting: Psychology

## 2019-01-26 DIAGNOSIS — F4323 Adjustment disorder with mixed anxiety and depressed mood: Secondary | ICD-10-CM | POA: Diagnosis not present

## 2019-01-28 ENCOUNTER — Other Ambulatory Visit: Payer: Self-pay

## 2019-01-28 ENCOUNTER — Ambulatory Visit
Admission: RE | Admit: 2019-01-28 | Discharge: 2019-01-28 | Disposition: A | Discharge status: Home / Self Care | Payer: Medicare Other | Source: Ambulatory Visit | Attending: Obstetrics & Gynecology | Admitting: Obstetrics & Gynecology

## 2019-01-28 DIAGNOSIS — E2839 Other primary ovarian failure: Secondary | ICD-10-CM

## 2019-02-02 ENCOUNTER — Ambulatory Visit: Payer: BC Managed Care – PPO | Admitting: Family Medicine

## 2019-02-09 ENCOUNTER — Other Ambulatory Visit: Payer: Self-pay

## 2019-02-09 ENCOUNTER — Encounter: Payer: Self-pay | Admitting: Family Medicine

## 2019-02-09 ENCOUNTER — Ambulatory Visit (INDEPENDENT_AMBULATORY_CARE_PROVIDER_SITE_OTHER): Payer: Medicare Other | Admitting: Family Medicine

## 2019-02-09 ENCOUNTER — Ambulatory Visit (INDEPENDENT_AMBULATORY_CARE_PROVIDER_SITE_OTHER): Payer: Medicare Other | Admitting: Psychology

## 2019-02-09 VITALS — BP 122/76 | HR 76 | Temp 97.3°F | Ht 68.0 in | Wt 189.2 lb

## 2019-02-09 DIAGNOSIS — F4323 Adjustment disorder with mixed anxiety and depressed mood: Secondary | ICD-10-CM

## 2019-02-09 DIAGNOSIS — Z0000 Encounter for general adult medical examination without abnormal findings: Secondary | ICD-10-CM | POA: Diagnosis not present

## 2019-02-09 NOTE — Patient Instructions (Signed)
Think about low dose screening cT. You would only need since you have not been smoking for >15 years.   So nice to meet youj!   Preventive Care 66 Years and Older, Female Preventive care refers to lifestyle choices and visits with your health care provider that can promote health and wellness. This includes:  A yearly physical exam. This is also called an annual well check.  Regular dental and eye exams.  Immunizations.  Screening for certain conditions.  Healthy lifestyle choices, such as diet and exercise. What can I expect for my preventive care visit? Physical exam Your health care provider will check:  Height and weight. These may be used to calculate body mass index (BMI), which is a measurement that tells if you are at a healthy weight.  Heart rate and blood pressure.  Your skin for abnormal spots. Counseling Your health care provider may ask you questions about:  Alcohol, tobacco, and drug use.  Emotional well-being.  Home and relationship well-being.  Sexual activity.  Eating habits.  History of falls.  Memory and ability to understand (cognition).  Work and work Statistician.  Pregnancy and menstrual history. What immunizations do I need?  Influenza (flu) vaccine  This is recommended every year. Tetanus, diphtheria, and pertussis (Tdap) vaccine  You may need a Td booster every 10 years. Varicella (chickenpox) vaccine  You may need this vaccine if you have not already been vaccinated. Zoster (shingles) vaccine  You may need this after age 23. Pneumococcal conjugate (PCV13) vaccine  One dose is recommended after age 21. Pneumococcal polysaccharide (PPSV23) vaccine  One dose is recommended after age 59. Measles, mumps, and rubella (MMR) vaccine  You may need at least one dose of MMR if you were born in 1957 or later. You may also need a second dose. Meningococcal conjugate (MenACWY) vaccine  You may need this if you have certain conditions.  Hepatitis A vaccine  You may need this if you have certain conditions or if you travel or work in places where you may be exposed to hepatitis A. Hepatitis B vaccine  You may need this if you have certain conditions or if you travel or work in places where you may be exposed to hepatitis B. Haemophilus influenzae type b (Hib) vaccine  You may need this if you have certain conditions. You may receive vaccines as individual doses or as more than one vaccine together in one shot (combination vaccines). Talk with your health care provider about the risks and benefits of combination vaccines. What tests do I need? Blood tests  Lipid and cholesterol levels. These may be checked every 5 years, or more frequently depending on your overall health.  Hepatitis C test.  Hepatitis B test. Screening  Lung cancer screening. You may have this screening every year starting at age 74 if you have a 30-pack-year history of smoking and currently smoke or have quit within the past 15 years.  Colorectal cancer screening. All adults should have this screening starting at age 43 and continuing until age 77. Your health care provider may recommend screening at age 71 if you are at increased risk. You will have tests every 1-10 years, depending on your results and the type of screening test.  Diabetes screening. This is done by checking your blood sugar (glucose) after you have not eaten for a while (fasting). You may have this done every 1-3 years.  Mammogram. This may be done every 1-2 years. Talk with your health care provider about  how often you should have regular mammograms.  BRCA-related cancer screening. This may be done if you have a family history of breast, ovarian, tubal, or peritoneal cancers. Other tests  Sexually transmitted disease (STD) testing.  Bone density scan. This is done to screen for osteoporosis. You may have this done starting at age 53. Follow these instructions at home: Eating  and drinking  Eat a diet that includes fresh fruits and vegetables, whole grains, lean protein, and low-fat dairy products. Limit your intake of foods with high amounts of sugar, saturated fats, and salt.  Take vitamin and mineral supplements as recommended by your health care provider.  Do not drink alcohol if your health care provider tells you not to drink.  If you drink alcohol: ? Limit how much you have to 0-1 drink a day. ? Be aware of how much alcohol is in your drink. In the [place] one drink equals one 12 oz bottle of beer (355 mL), one 5 oz glass of wine (148 mL), or one 1 oz glass of hard liquor (44 mL). Lifestyle  Take daily care of your teeth and gums.  Stay active. Exercise for at least 30 minutes on 5 or more days each week.  Do not use any products that contain nicotine or tobacco, such as cigarettes, e-cigarettes, and chewing tobacco. If you need help quitting, ask your health care provider.  If you are sexually active, practice safe sex. Use a condom or other form of protection in order to prevent STIs (sexually transmitted infections).  Talk with your health care provider about taking a low-dose aspirin or statin. What's next?  Go to your health care provider once a year for a well check visit.  Ask your health care provider how often you should have your eyes and teeth checked.  Stay up to date on all vaccines. This information is not intended to replace advice given to you by your health care provider. Make sure you discuss any questions you have with your health care provider. Document Released: 05/04/2015 Document Revised: 04/01/2018 Document Reviewed: 04/01/2018 Elsevier Patient Education  2020 [employer].

## 2019-02-09 NOTE — Progress Notes (Signed)
Phone: [phone]  Subjective:  Patient presents today for their Welcome to Medicare Exam    Preventive Screening-Counseling & Management  Vision screen: 20/30 in right eye and 20/20 in left eye. 20/20 both eyes.   Hearing Screening   125Hz  250Hz  500Hz  1000Hz  2000Hz  3000Hz  4000Hz  6000Hz  8000Hz   Right ear:           Left ear:             Visual Acuity Screening   Right eye Left eye Both eyes  Without correction: 20/30 20/20 20/20   With correction:       Advanced directives: yes  Smoking Status: past smoker. Stopped in 1991, cold [place]. Hx of 30 pack years.  Second Hand Smoking status: No smokers in home  Risk Factors Regular exercise: chasing a toddler (grandson). She was before the pandemic.  Diet: "okay" fruits and veggies, moderate sweets intake  Fall Risk: None  Fall Risk  02/09/2019  Falls in the past year? 0   Opioid use history:  no long term opioids use  Cardiac risk factors:  advanced age (older than 52 for men, 36 for women): yes. >65 years.  Hyperlipidemia: elevated TG, low HDL. Mildly elevated LDL.  No diabetes. none Family History: mother: aortic valve replacement and AAA. Father had 2 MI/stents. Late 35s' first MI.   The 10-year ASCVD risk score Mikey Bussing DC Brooke Bonito., et al., 2013) is: 6.7%   Values used to calculate the score:     Age: 68 years     Sex: Female     Is Non-Hispanic African American: No     Diabetic: No     Tobacco smoker: No     Systolic Blood Pressure: 123XX123 mmHg     Is BP treated: No     HDL Cholesterol: 32 mg/dL     Total Cholesterol: 188 mg/dL  Depression Screen None. PHQ2 0  Depression screen [facility] 2/9 02/09/2019 11/05/2018 04/23/2017 09/10/2016  Decreased Interest 0 1 1 0  Down, Depressed, Hopeless 0 0 0 0  PHQ - 2 Score 0 1 1 0  Altered sleeping 1 2 2  -  Tired, decreased energy 1 1 0 -  Change in appetite 0 1 0 -  Feeling bad or failure about yourself  0 0 0 -  Trouble concentrating 1 1 2  -  Moving slowly or fidgety/restless 0 0 0  -  Suicidal thoughts 0 0 0 -  PHQ-9 Score 3 6 5  -  Difficult doing work/chores Not difficult at all Not difficult at all Not difficult at all -    Activities of Daily Living Independent ADLs and IADLs none  Hearing Difficulties: -patient declines  Cognitive Testing No reported trouble.  None, 5/5  Normal 3 word recall  List the Names of Other Physician/Practitioners you currently use: -Dr. Casimiro Needle for wellbutrin/restoril.  -Dr. Opal Sidles perrin for psychology  -dr. Benjie Karvonen for gyn.   Immunization History  Administered Date(s) Administered  . Influenza Whole 01/20/2008, 02/02/2009, 01/10/2010  . Influenza,inj,Quad PF,6+ Mos 01/06/2015, 01/08/2016, 01/19/2017  . Influenza,inj,quad, With Preservative 01/06/2015  . Influenza-Unspecified 01/20/2012, 01/06/2013, 01/16/2014, 01/01/2018, 12/24/2018  . Pneumococcal Conjugate-13 11/05/2018  . Pneumococcal Polysaccharide-23 07/29/2013  . Td 03/26/2009  . Tdap 07/25/2016  . Zoster 08/11/2008  . Zoster Recombinat (Shingrix) 12/04/2016, 03/16/2017   Required Immunizations needed today none   Screening tests- up to date There are no preventive care reminders to display for this patient.  Review of Systems  Constitutional: Negative for chills, fever and malaise/fatigue.  HENT: Negative for hearing loss and sore throat.   Eyes: Negative for blurred vision and double vision.  Respiratory: Negative for cough, shortness of breath and wheezing.   Cardiovascular: Negative for chest pain, palpitations and leg swelling.  Gastrointestinal: Negative for abdominal pain, blood in stool, nausea and vomiting.  Genitourinary: Negative for dysuria and hematuria.  Musculoskeletal: Negative for falls.  Skin: Negative for rash.  Neurological: Negative for dizziness and weakness.  Psychiatric/Behavioral: Negative for memory loss and suicidal ideas. The patient is not nervous/anxious and does not have insomnia.      The following were reviewed  and entered/updated in epic: Past Medical History:  Diagnosis Date  . 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 Allergy    weekly allergy shots  . Anxiety   . Arthritis   . Asthma   . Complication of anesthesia    difficult intubation  . Depression   . Fibrocystic breast disease   . GERD (gastroesophageal reflux disease)   . OSA (obstructive sleep apnea) 07/31/2013  . Peptic ulcer disease   . TMJ locking    Patient Active Problem List   Diagnosis Date Noted  . Urge incontinence 11/05/2018  . Diverticulosis of colon without hemorrhage   . Primary osteoarthritis of first carpometacarpal joint of left hand 06/22/2018  . Chronic pain of left thumb 06/22/2018  . History of retinal tear 10/20/2016  . Low back pain 09/28/2015  . Insomnia 08/18/2013  . OSA (obstructive sleep apnea) 07/31/2013  . Thyroid nodule, cold 07/15/2012  . Anxiety and depression 10/05/2007  . Allergic rhinitis due to pollen 10/05/2007  . Asthma 10/05/2007   Past Surgical History:  Procedure Laterality Date  . ABDOMINAL HYSTERECTOMY  2003   due to fibroids  . ADENOIDECTOMY     x3  . BIOPSY  06/29/2018   Procedure: BIOPSY;  Surgeon: Lavena Bullion, DO;  Location: [facility];  Service: Gastroenterology;;  . BREAST CYST ASPIRATION    . COLONOSCOPY  03/10/2007   results-normal  . COLONOSCOPY WITH PROPOFOL N/A 06/29/2018   Procedure: COLONOSCOPY WITH PROPOFOL;  Surgeon: Lavena Bullion, DO;  Location: [facility];  Service: Gastroenterology;  Laterality: N/A;  . HAMMER TOE SURGERY    . POLYPECTOMY  06/29/2018   Procedure: POLYPECTOMY;  Surgeon: Lavena Bullion, DO;  Location: [facility];  Service: Gastroenterology;;  . Lavonda Jumbo  '90's   for incontinence  . RHINOPLASTY  1984  . ROTATOR CUFF REPAIR Right 2013   [place]  . ROTATOR CUFF REPAIR Left 2012   [place]  . SHOULDER SURGERY    . TONSILLECTOMY     x2  . TUBAL  LIGATION    . [place]  . WRIST SURGERY      Family History  Problem Relation Age of Onset  . Osteoporosis Mother   . Fibroids Mother   . Other Mother        aortic valve replacement 1994, abdominal aortic aneurysm  . Lung cancer Mother 44  . Heart disease Father   . Testicular cancer Father 81  . Prostate cancer Father 39  . Diabetes Maternal Grandmother   . Heart disease Maternal Grandfather   . Heart disease Paternal Grandmother   . Diabetes Paternal Grandfather   . Stomach cancer Paternal Grandfather   . Breast cancer Paternal Aunt   . Breast cancer Cousin   . Colon cancer Neg Hx   . Throat cancer  Neg Hx   . Pancreatic cancer Neg Hx     Medications- reviewed and updated Current Outpatient Medications  Medication Sig Dispense Refill  . buPROPion (WELLBUTRIN XL) 300 MG 24 hr tablet Take 300 mg by mouth daily.    . Calcium Carbonate-Vitamin D (CALTRATE 600+D) 600-400 MG-UNIT per tablet Take 1 tablet by mouth daily.      . diclofenac sodium (VOLTAREN) 1 % GEL Apply 2-4 g topically 4 (four) times daily. 6 Tube 2  . EPINEPHrine 0.3 mg/0.3 mL IJ SOAJ injection Inject into the muscle once.    . fluticasone (FLONASE) 50 MCG/ACT nasal spray Place 2 sprays into both nostrils daily.     . Iron-Vitamin C (VITRON-C) 65-125 MG TABS Take 1 tablet by mouth daily.    Marland Kitchen levocetirizine (XYZAL) 5 MG tablet Take 5 mg by mouth every evening.    . Multiple Vitamin (MULTIVITAMIN) tablet Take 1 tablet by mouth daily.      Marland Kitchen PROAIR RESPICLICK 123XX123 (90 Base) MCG/ACT AEPB as needed.     . temazepam (RESTORIL) 30 MG capsule Take 30 mg by mouth at bedtime as needed for sleep.     Current Facility-Administered Medications  Medication Dose Route Frequency Provider Last Rate Last Dose  . 0.9 %  sodium chloride infusion  500 mL Intravenous Once [place] Park, MD        Allergies-reviewed and updated Allergies  Allergen Reactions  . Penicillins Anaphylaxis    Stopped breathing  REACTION: stop breathing    Social History   Socioeconomic History  . Marital status: Married    Spouse name: Not on file  . Number of children: Not on file  . Years of education: Not on file  . Highest education level: Not on file  Occupational History  . Not on file  Social Needs  . Financial resource strain: Not on file  . Food insecurity    Worry: Not on file    Inability: Not on file  . Transportation needs    Medical: Not on file    Non-medical: Not on file  Tobacco Use  . Smoking status: Former Smoker    Packs/day: 2.00    Years: 15.00    Pack years: 30.00    Types: Cigarettes    Quit date: 11/19/1989    Years since quitting: 29.2  . Smokeless tobacco: Never Used  Substance and Sexual Activity  . Alcohol use: Yes    Comment: maybe a glass of wine on the weekend  . Drug use: No  . Sexual activity: Not on file  Lifestyle  . Physical activity    Days per week: Not on file    Minutes per session: Not on file  . Stress: Not on file  Relationships  . Social Herbalist on phone: Not on file    Gets together: Not on file    Attends religious service: Not on file    Active member of club or organization: Not on file    Attends meetings of clubs or organizations: Not on file    Relationship status: Not on file  Other Topics Concern  . 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 Oropharynx normal Neck: no thyromegaly CV: RRR no murmurs rubs or gallops Lungs: CTAB no crackles, wheeze, rhonchi Abdomen: soft/nontender/nondistended/normal  bowel sounds. No rebound or guarding.  Ext: no edema Skin: warm, dry Neuro: grossly normal, moves all extremities, PERRLA  Assessment/Plan:  Welcome to Medicare exam completed- discussed recommended screenings anddocumented any personalized health advice and referrals for preventive counseling. See AVS as well which was given to patient. UTD on her HM.   -ekg: reviewed and wnl.   Status of chronic or acute concerns  Depression/anxiety: would like me to start filling her wellbutrin from psychiatrist. Will take this over as well as restoril. Discussed would need to be seen q 79months for this.  No problem-specific Assessment & Plan notes found for this encounter.   Future Appointments  Date Time Provider [place]  02/23/2019  3:30 PM Doree Fudge, PhD [facility] None  03/09/2019  3:30 PM Doree Fudge, PhD [facility] None  03/23/2019  3:30 PM Doree Fudge, PhD [facility] None  04/06/2019  3:30 PM Doree Fudge, PhD [facility] None  04/20/2019  3:30 PM Doree Fudge, PhD [facility] None   Return in about 9 months (around 11/09/2019) for hyperlipidemia/labs/.   Lab/Order associations: Welcome to Medicare preventive visit - Plan: EKG 12-Lead  No orders of the defined types were placed in this encounter.   Return precautions advised. Orma Flaming, MD

## 2019-02-15 DIAGNOSIS — G4723 Circadian rhythm sleep disorder, irregular sleep wake type: Secondary | ICD-10-CM | POA: Diagnosis not present

## 2019-02-23 ENCOUNTER — Ambulatory Visit (INDEPENDENT_AMBULATORY_CARE_PROVIDER_SITE_OTHER): Payer: Medicare Other | Admitting: Psychology

## 2019-02-23 DIAGNOSIS — F4323 Adjustment disorder with mixed anxiety and depressed mood: Secondary | ICD-10-CM | POA: Diagnosis not present

## 2019-02-25 ENCOUNTER — Other Ambulatory Visit: Payer: Self-pay

## 2019-02-25 DIAGNOSIS — Z20822 Contact with and (suspected) exposure to covid-19: Secondary | ICD-10-CM

## 2019-02-26 LAB — NOVEL CORONAVIRUS, NAA: SARS-CoV-2, NAA: NOT DETECTED

## 2019-03-09 ENCOUNTER — Ambulatory Visit (INDEPENDENT_AMBULATORY_CARE_PROVIDER_SITE_OTHER): Payer: Medicare Other | Admitting: Psychology

## 2019-03-09 DIAGNOSIS — F4323 Adjustment disorder with mixed anxiety and depressed mood: Secondary | ICD-10-CM | POA: Diagnosis not present

## 2019-03-21 ENCOUNTER — Other Ambulatory Visit: Payer: Self-pay

## 2019-03-21 ENCOUNTER — Encounter: Payer: Self-pay | Admitting: Physician Assistant

## 2019-03-21 ENCOUNTER — Ambulatory Visit (HOSPITAL_COMMUNITY)
Admission: RE | Admit: 2019-03-21 | Discharge: 2019-03-21 | Disposition: A | Discharge status: Home / Self Care | Payer: Medicare Other | Source: Ambulatory Visit | Attending: Physician Assistant | Admitting: Physician Assistant

## 2019-03-21 ENCOUNTER — Ambulatory Visit (INDEPENDENT_AMBULATORY_CARE_PROVIDER_SITE_OTHER): Payer: Medicare Other | Admitting: Physician Assistant

## 2019-03-21 VITALS — BP 172/100 | HR 65 | Temp 97.5°F | Ht 68.0 in | Wt 190.0 lb

## 2019-03-21 DIAGNOSIS — R29818 Other symptoms and signs involving the nervous system: Secondary | ICD-10-CM

## 2019-03-21 DIAGNOSIS — E041 Nontoxic single thyroid nodule: Secondary | ICD-10-CM

## 2019-03-21 DIAGNOSIS — R42 Dizziness and giddiness: Secondary | ICD-10-CM | POA: Diagnosis not present

## 2019-03-21 LAB — COMPREHENSIVE METABOLIC PANEL
ALT: 27 U/L (ref 0–35)
AST: 24 U/L (ref 0–37)
Albumin: 4.2 g/dL (ref 3.5–5.2)
Alkaline Phosphatase: 54 U/L (ref 39–117)
BUN: 20 mg/dL (ref 6–23)
CO2: 27 mEq/L (ref 19–32)
Calcium: 9.8 mg/dL (ref 8.4–10.5)
Chloride: 103 mEq/L (ref 96–112)
Creatinine, Ser: 0.76 mg/dL (ref 0.40–1.20)
GFR: 76.08 mL/min (ref >60.00)
Glucose, Bld: 94 mg/dL (ref 70–99)
Potassium: 4.4 mEq/L (ref 3.5–5.1)
Sodium: 139 mEq/L (ref 135–145)
Total Bilirubin: 0.4 mg/dL (ref 0.2–1.2)
Total Protein: 7.2 g/dL (ref 6.0–8.3)

## 2019-03-21 LAB — POC URINALSYSI DIPSTICK (AUTOMATED)
Bilirubin, UA: NEGATIVE
Blood, UA: NEGATIVE
Glucose, UA: NEGATIVE
Ketones, UA: NEGATIVE
Leukocytes, UA: NEGATIVE
Nitrite, UA: NEGATIVE
Protein, UA: NEGATIVE
Spec Grav, UA: 1.015 (ref 1.010–1.025)
Urobilinogen, UA: 0.2 E.U./dL
pH, UA: 6 (ref 5.0–8.0)

## 2019-03-21 LAB — CBC WITH DIFFERENTIAL/PLATELET
Basophils Absolute: 0 10*3/uL (ref 0.0–0.1)
Basophils Relative: 0.7 % (ref 0.0–3.0)
Eosinophils Absolute: 0.1 10*3/uL (ref 0.0–0.7)
Eosinophils Relative: 1.8 % (ref 0.0–5.0)
HCT: 35.4 % — ABNORMAL LOW (ref 36.0–46.0)
Hemoglobin: 11.8 g/dL — ABNORMAL LOW (ref 12.0–15.0)
Lymphocytes Relative: 30.2 % (ref 12.0–46.0)
Lymphs Abs: 1.8 10*3/uL (ref 0.7–4.0)
MCHC: 33.2 g/dL (ref 30.0–36.0)
MCV: 83.1 fl (ref 78.0–100.0)
Monocytes Absolute: 0.5 10*3/uL (ref 0.1–1.0)
Monocytes Relative: 8.6 % (ref 3.0–12.0)
Neutro Abs: 3.5 10*3/uL (ref 1.4–7.7)
Neutrophils Relative %: 58.7 % (ref 43.0–77.0)
Platelets: 275 10*3/uL (ref 150.0–400.0)
RBC: 4.26 Mil/uL (ref 3.87–5.11)
RDW: 14.8 % (ref 11.5–15.5)
WBC: 6 10*3/uL (ref 4.0–10.5)

## 2019-03-21 LAB — TSH: TSH: 1.96 u[IU]/mL (ref 0.35–4.50)

## 2019-03-21 NOTE — Patient Instructions (Signed)
It was great to see you!  I will be in touch with your CT results. Next steps will be determined at that time.  Get help right away if:  Your headache gets very bad quickly.  Your headache gets worse after a lot of physical activity.  You keep throwing up.  You have a stiff neck.  You have trouble seeing.  You have trouble speaking.  You have pain in the eye or ear.  Your muscles are weak or you lose muscle control.  You lose your balance or have trouble walking.  You feel like you will pass out (faint) or you pass out.  You are mixed up (confused).  You have a seizure.  Take care,  [name] PA-C

## 2019-03-21 NOTE — Progress Notes (Signed)
Chelsey Jackson is a 68 y.o. female here for a new problem.  History of Present Illness:   Chief Complaint  Patient presents with  . unbalanced    HPI   Unbalanced Pt c/o having episodes once a week for the past 2-3 weeks. She is c/o feeling unbalanced, slight dizziness, slight nausea and diarrhea.  States that the diarrhea is not necessarily watery, but more so soft stools.  Denies rectal bleeding.  States that she feels like her stomach is "sloshing around".  She is having an episode today. Denies headaches, chest pain, SOB, lower leg swelling, changes in vision, double vision, slurred speech, weakness on one side of the body.  Feels like her "footing is not quite right". Had swordfish for the first time last night. Ate a cup of yogurt with granola this morning. Denies any concerns for lactose intolerance. Had a stomach ulcer about 16 years or so ago.  Takes antacid as needed, but not very regularly.  Denies any recent travel.  Has history of cold thyroid nodule that was last evaluated July 2016 via u/s: IMPRESSION: No significant interval change in the appearance of the multinodular thyroid goiter compared to 08/09/2013.  Past Medical History:  Diagnosis Date  . 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 Allergy    weekly allergy shots  . Anxiety   . Arthritis   . Asthma   . Complication of anesthesia    difficult intubation  . Depression   . Fibrocystic breast disease   . GERD (gastroesophageal reflux disease)   . OSA (obstructive sleep apnea) 07/31/2013  . Peptic ulcer disease   . TMJ locking      Social History   Socioeconomic History  . Marital status: Married    Spouse name: Not on file  . Number of children: Not on file  . Years of education: Not on file  . Highest education level: Not on file  Occupational History  . Not on file  Social Needs  . Financial resource strain: Not on file  . Food insecurity     Worry: Not on file    Inability: Not on file  . Transportation needs    Medical: Not on file    Non-medical: Not on file  Tobacco Use  . Smoking status: Former Smoker    Packs/day: 2.00    Years: 15.00    Pack years: 30.00    Types: Cigarettes    Quit date: 11/19/1989    Years since quitting: 29.3  . Smokeless tobacco: Never Used  Substance and Sexual Activity  . Alcohol use: Yes    Comment: maybe a glass of wine on the weekend  . Drug use: No  . Sexual activity: Not on file  Lifestyle  . Physical activity    Days per week: Not on file    Minutes per session: Not on file  . Stress: Not on file  Relationships  . Social Herbalist on phone: Not on file    Gets together: Not on file    Attends religious service: Not on file    Active member of club or organization: Not on file    Attends meetings of clubs or organizations: Not on file    Relationship status: Not on file  . Intimate partner violence    Fear of current or ex partner: Not on file    Emotionally abused: Not on file  Physically abused: Not on file    Forced sexual activity: Not on file  Other Topics Concern  . 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 ABDOMINAL HYSTERECTOMY  2003   due to fibroids  . ADENOIDECTOMY     x3  . BIOPSY  06/29/2018   Procedure: BIOPSY;  Surgeon: Lavena Bullion, DO;  Location: [facility];  Service: Gastroenterology;;  . BREAST CYST ASPIRATION    . COLONOSCOPY  03/10/2007   results-normal  . COLONOSCOPY WITH PROPOFOL N/A 06/29/2018   Procedure: COLONOSCOPY WITH PROPOFOL;  Surgeon: Lavena Bullion, DO;  Location: [facility];  Service: Gastroenterology;  Laterality: N/A;  . HAMMER TOE SURGERY    . POLYPECTOMY  06/29/2018   Procedure: POLYPECTOMY;  Surgeon: Lavena Bullion, DO;  Location[facility];  Service: Gastroenterology;;  . Lavonda Jumbo  '90's   for incontinence  . RHINOPLASTY  1984  . ROTATOR CUFF REPAIR Right 2013   [place]  . ROTATOR CUFF REPAIR Left 2012   [place]  . SHOULDER SURGERY    . TONSILLECTOMY     x2  . TUBAL LIGATION    . [place]  . WRIST SURGERY      Family History  Problem Relation Age of Onset  . Osteoporosis Mother   . Fibroids Mother   . Other Mother        aortic valve replacement 1994, abdominal aortic aneurysm  . Lung cancer Mother 43  . Heart disease Father   . Testicular cancer Father 38  . Prostate cancer Father 49  . Diabetes Maternal Grandmother   . Heart disease Maternal Grandfather   . Heart disease Paternal Grandmother   . Diabetes Paternal Grandfather   . Stomach cancer Paternal Grandfather   . Breast cancer Paternal Aunt   . Breast cancer Cousin   . Colon cancer Neg Hx   . Throat cancer Neg Hx   . Pancreatic cancer Neg Hx     Allergies  Allergen Reactions  . Penicillins Anaphylaxis    Stopped breathing REACTION: stop breathing    Current Medications:   Current Outpatient Medications:  .  buPROPion (WELLBUTRIN XL) 300 MG 24 hr tablet, Take 300 mg by mouth daily., Disp: , Rfl:  .  Calcium Carbonate-Vitamin D (CALTRATE 600+D) 600-400 MG-UNIT per tablet, Take 1 tablet by mouth daily.  , Disp: , Rfl:  .  diclofenac sodium (VOLTAREN) 1 % GEL, Apply 2-4 g topically 4 (four) times daily., Disp: 6 Tube, Rfl: 2 .  EPINEPHrine 0.3 mg/0.3 mL IJ SOAJ injection, Inject into the muscle once., Disp: , Rfl:  .  fluticasone (FLONASE) 50 MCG/ACT nasal spray, Place 2 sprays into both nostrils daily. , Disp: , Rfl:  .  Iron-Vitamin C (VITRON-C) 65-125 MG TABS, Take 1 tablet by mouth daily., Disp: , Rfl:  .  levocetirizine (XYZAL) 5 MG tablet, Take 5 mg by mouth every evening., Disp: , Rfl:  .  Multiple Vitamin (MULTIVITAMIN) tablet, Take 1 tablet by mouth daily.  , Disp: , Rfl:  .  PROAIR RESPICLICK  123XX123 (90 Base) MCG/ACT AEPB, as needed. , Disp: , Rfl:  .  temazepam (RESTORIL) 30 MG capsule, Take 30 mg by mouth at bedtime as needed for sleep., Disp: , Rfl:  Current Facility-Administered Medications:  .  0.9 %  sodium chloride infusion, 500 mL, Intravenous, Once, Thornton Park, MD   Review of Systems:   ROS  Negative unless otherwise specified per HPI.   Vitals:   Vitals:   03/21/19 1211 03/21/19 1332 03/21/19 1345  BP: 105/75 (!) 162/90 (!) 172/100  Pulse: 67 63 65  Temp:  (!) 97.5 F (36.4 C)   TempSrc:  Temporal   SpO2:  96%   Weight:  190 lb (86.2 kg)   Height:  5\' 8"  (1.727 m)      Body mass index is 28.89 kg/m.  Physical Exam:   Physical Exam Vitals signs and nursing note reviewed.  Constitutional:      General: She is not in acute distress.    Appearance: She is well-developed. She is not ill-appearing or toxic-appearing.  Eyes:     General: No visual field deficit. Cardiovascular:     Rate and Rhythm: Normal rate and regular rhythm.     Pulses: Normal pulses.     Heart sounds: Normal heart sounds, S1 normal and S2 normal.     Comments: No LE edema Pulmonary:     Effort: Pulmonary effort is normal.     Breath sounds: Normal breath sounds.  Skin:    General: Skin is warm and dry.  Neurological:     Mental Status: She is alert.     GCS: GCS eye subscore is 4. GCS verbal subscore is 5. GCS motor subscore is 6.     Cranial Nerves: No facial asymmetry.     Sensory: Sensation is intact.     Motor: Motor function is intact.     Coordination: Coordination is intact.     Comments: Has delayed tracking with lateral rectus EOM of bilateral eyes  Psychiatric:        Speech: Speech normal.        Behavior: Behavior normal. Behavior is cooperative.      Assessment and Plan:   Daniellemarie was seen today for unbalanced.  Diagnoses and all orders for this visit:  Dizziness; Other symptoms and signs involving the nervous system Unclear etiology,  although I do think her lack of hydration was contributing to some of her symptoms. Dr. Dimas Chyle also in to evaluate patient's EOM abnormality. Will obtain stat CT scan to r/o mass or other abnormality. CBC, CMP, TSH obtained. Further interventions based upon lab results. Strict ER precautions reviewed with patient. -     CBC with Differential -     Comprehensive metabolic panel -     TSH -     POCT Urinalysis Dipstick (Automated) -     CT Head Wo Contrast; Future  Thyroid nodule, cold Will defer to PCP for further follow-up per patient's request. -     TSH  . Reviewed expectations re: course of current medical issues. . Discussed self-management of symptoms. . Outlined signs and symptoms indicating need for more acute intervention. . Patient verbalized understanding and all questions were answered. . See orders for this visit as documented in the electronic medical record. . Patient received an After-Visit Summary.  CMA or LPN served as scribe during this visit. History, Physical, and Plan performed by medical provider. The above documentation has been reviewed and is accurate and complete.  [name], PA-C

## 2019-03-21 NOTE — Progress Notes (Deleted)
Chelsey Jackson is a 68 y.o. female here for a {New prob or follow up:31724}.  SCRIBE STATEMENT  History of Present Illness:   Chief Complaint  Patient presents with  . unbalanced    HPI     Feels like her "footing is not quite right". Had swordfish for the first time last night. Ate a cup of yogurt with granola this. Denies any concerns for lactose intolerance.  Two weeks ago, everything was spinning. Today she just doesn't feel "right." When she she turns around quickly she feels off.  Hasn't had much water to drink today. Stomach feels like its "sloshing around" then has very soft stools, not liquidy. Went an hour and a half ago. It comes ago.  Denies: chest pain, SOB, lower leg swelling, changes in vision, slurred speech, weakness on one side of the body  Had a stomach ulcer about 16 years or so ago.    BP Readings from Last 3 Encounters:  03/21/19 105/75  02/09/19 122/76  11/05/18 140/80   Gave blood last Wednesday. COVID-19 negative test.   Past Medical History:  Diagnosis Date  . 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 Allergy    weekly allergy shots  . Anxiety   . Arthritis   . Asthma   . Complication of anesthesia    difficult intubation  . Depression   . Fibrocystic breast disease   . GERD (gastroesophageal reflux disease)   . OSA (obstructive sleep apnea) 07/31/2013  . Peptic ulcer disease   . TMJ locking      Social History   Socioeconomic History  . Marital status: Married    Spouse name: Not on file  . Number of children: Not on file  . Years of education: Not on file  . Highest education level: Not on file  Occupational History  . Not on file  Social Needs  . Financial resource strain: Not on file  . Food insecurity    Worry: Not on file    Inability: Not on file  . Transportation needs    Medical: Not on file    Non-medical: Not on file  Tobacco Use  . Smoking status:  Former Smoker    Packs/day: 2.00    Years: 15.00    Pack years: 30.00    Types: Cigarettes    Quit date: 11/19/1989    Years since quitting: 29.3  . Smokeless tobacco: Never Used  Substance and Sexual Activity  . Alcohol use: Yes    Comment: maybe a glass of wine on the weekend  . Drug use: No  . Sexual activity: Not on file  Lifestyle  . Physical activity    Days per week: Not on file    Minutes per session: Not on file  . Stress: Not on file  Relationships  . Social Herbalist on phone: Not on file    Gets together: Not on file    Attends religious service: Not on file    Active member of club or organization: Not on file    Attends meetings of clubs or organizations: Not on file    Relationship status: Not on file  . Intimate partner violence    Fear of current or ex partner: Not on file    Emotionally abused: Not on file    Physically abused: Not on file    Forced sexual activity: Not on file  Other Topics  Concern  . 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 ABDOMINAL HYSTERECTOMY  2003   due to fibroids  . ADENOIDECTOMY     x3  . BIOPSY  06/29/2018   Procedure: BIOPSY;  Surgeon: Lavena Bullion, DO;  Location: [facility];  Service: Gastroenterology;;  . BREAST CYST ASPIRATION    . COLONOSCOPY  03/10/2007   results-normal  . COLONOSCOPY WITH PROPOFOL N/A 06/29/2018   Procedure: COLONOSCOPY WITH PROPOFOL;  Surgeon: Lavena Bullion, DO;  Location: [facility];  Service: Gastroenterology;  Laterality: N/A;  . HAMMER TOE SURGERY    . POLYPECTOMY  06/29/2018   Procedure: POLYPECTOMY;  Surgeon: Lavena Bullion, DO;  Location: [facility];  Service: Gastroenterology;;  . Lavonda Jumbo  '90's   for incontinence  . RHINOPLASTY  1984  . ROTATOR CUFF REPAIR Right 2013   [place]   . ROTATOR CUFF REPAIR Left 2012   [place]  . SHOULDER SURGERY    . TONSILLECTOMY     x2  . TUBAL LIGATION    . [place]  . WRIST SURGERY      Family History  Problem Relation Age of Onset  . Osteoporosis Mother   . Fibroids Mother   . Other Mother        aortic valve replacement 1994, abdominal aortic aneurysm  . Lung cancer Mother 5  . Heart disease Father   . Testicular cancer Father 82  . Prostate cancer Father 39  . Diabetes Maternal Grandmother   . Heart disease Maternal Grandfather   . Heart disease Paternal Grandmother   . Diabetes Paternal Grandfather   . Stomach cancer Paternal Grandfather   . Breast cancer Paternal Aunt   . Breast cancer Cousin   . Colon cancer Neg Hx   . Throat cancer Neg Hx   . Pancreatic cancer Neg Hx     Allergies  Allergen Reactions  . Penicillins Anaphylaxis    Stopped breathing REACTION: stop breathing    Current Medications:   Current Outpatient Medications:  .  buPROPion (WELLBUTRIN XL) 300 MG 24 hr tablet, Take 300 mg by mouth daily., Disp: , Rfl:  .  Calcium Carbonate-Vitamin D (CALTRATE 600+D) 600-400 MG-UNIT per tablet, Take 1 tablet by mouth daily.  , Disp: , Rfl:  .  diclofenac sodium (VOLTAREN) 1 % GEL, Apply 2-4 g topically 4 (four) times daily., Disp: 6 Tube, Rfl: 2 .  EPINEPHrine 0.3 mg/0.3 mL IJ SOAJ injection, Inject into the muscle once., Disp: , Rfl:  .  fluticasone (FLONASE) 50 MCG/ACT nasal spray, Place 2 sprays into both nostrils daily. , Disp: , Rfl:  .  Iron-Vitamin C (VITRON-C) 65-125 MG TABS, Take 1 tablet by mouth daily., Disp: , Rfl:  .  levocetirizine (XYZAL) 5 MG tablet, Take 5 mg by mouth every evening., Disp: , Rfl:  .  Multiple Vitamin (MULTIVITAMIN) tablet, Take 1 tablet by mouth daily.  , Disp: , Rfl:  .  PROAIR RESPICLICK 123XX123 (90 Base) MCG/ACT AEPB, as needed. , Disp: , Rfl:  .  temazepam (RESTORIL) 30 MG capsule, Take 30 mg by mouth at bedtime as needed for sleep., Disp: , Rfl:    Current Facility-Administered Medications:  .  0.9 %  sodium chloride infusion, 500 mL, Intravenous,  Once, [place], MD   Review of Systems:   ROS  Vitals:   Vitals:   03/21/19 1211  BP: 105/75  Pulse: 67     There is no height or weight on file to calculate BMI.  Physical Exam:   Physical Exam  Results for orders placed or performed in visit on 02/25/19  Novel Coronavirus, NAA ([facility])   Specimen: Nasopharyngeal(NP) swabs in vial transport medium   NASOPHARYNGE  TESTING  Result Value Ref Range   SARS-CoV-2, NAA Not Detected Not Detected    Assessment and Plan:   There are no diagnoses linked to this encounter.  . Reviewed expectations re: course of current medical issues. . Discussed self-management of symptoms. . Outlined signs and symptoms indicating need for more acute intervention. . Patient verbalized understanding and all questions were answered. . See orders for this visit as documented in the electronic medical record. . Patient received an After-Visit Summary.  ***  [name], PA-C

## 2019-03-22 ENCOUNTER — Telehealth: Payer: Self-pay | Admitting: Family Medicine

## 2019-03-22 NOTE — Telephone Encounter (Signed)
Copied from [place] [id number]. Topic: General - Other >> Mar 22, 2019  9:46 AM Carolyn Stare wrote: Pt call to say yes she would the referral to a neurologist

## 2019-03-23 ENCOUNTER — Ambulatory Visit: Payer: BC Managed Care – PPO | Admitting: Psychology

## 2019-03-23 ENCOUNTER — Ambulatory Visit (INDEPENDENT_AMBULATORY_CARE_PROVIDER_SITE_OTHER): Payer: Medicare Other

## 2019-03-23 VITALS — BP 134/80 | HR 67

## 2019-03-23 DIAGNOSIS — R030 Elevated blood-pressure reading, without diagnosis of hypertension: Secondary | ICD-10-CM | POA: Diagnosis not present

## 2019-03-23 NOTE — Telephone Encounter (Signed)
Patient called to say that she have been waiting on a call from Dr Rogers Blocker nurse to schedule an appointment to come in and check on her BP say that she have been having elevated BP and does no know what is causing it. Please call patient today at Ph#  [phone]

## 2019-03-23 NOTE — Progress Notes (Signed)
Pt here for blood pressure check which was advised by [name], PA-C due to elevated reading on 11/30 at her OV for some dizziness.    Currently not taking any medications for her blood pressure and denies any current symptoms and feels completely fine.  BP today was 134/80 in L arm, seated.  Pt brought in home cuff which read 146/90.  Again, denies any blurry vision, chest pain or any other symptoms.

## 2019-03-23 NOTE — Telephone Encounter (Signed)
See not °

## 2019-03-23 NOTE — Telephone Encounter (Signed)
Spoke to patient.  She is coming in today at 3:30 for nurse visit for a blood pressure check.

## 2019-03-23 NOTE — Progress Notes (Signed)
Also-eye exam normal today. No abnormal tracking. She is followed closely by opthalmology for past retinal tear. I want her to call them and follow up with them to see if any abnormal findings. She has had no more symptoms of dizziness. Head CT was normal. No vision changes. Will defer to ophthalmology if needs brain MRI. She will let me know if any change in symptoms.  Chelsey Flaming, MD [place]

## 2019-03-23 NOTE — Telephone Encounter (Signed)
I have not received a note about elevated blood pressure. I just saw her and it was fine. It was elevated when she saw the Pa, but likely due to nerves. Can come in for nurse check today to see what it is running.  Orma Flaming, MD [place]

## 2019-03-29 ENCOUNTER — Encounter: Payer: Self-pay | Admitting: Family Medicine

## 2019-04-06 ENCOUNTER — Ambulatory Visit (INDEPENDENT_AMBULATORY_CARE_PROVIDER_SITE_OTHER): Payer: Medicare Other | Admitting: Psychology

## 2019-04-06 DIAGNOSIS — F4323 Adjustment disorder with mixed anxiety and depressed mood: Secondary | ICD-10-CM | POA: Diagnosis not present

## 2019-04-20 ENCOUNTER — Ambulatory Visit: Payer: BC Managed Care – PPO | Admitting: Psychology

## 2019-04-25 ENCOUNTER — Encounter: Payer: Self-pay | Admitting: Family Medicine

## 2019-04-27 ENCOUNTER — Other Ambulatory Visit: Payer: Self-pay

## 2019-04-27 MED ORDER — TEMAZEPAM 30 MG PO CAPS: 30.0000 mg | ORAL_CAPSULE | Freq: Every evening | ORAL | 0 refills | Status: DC | PRN

## 2019-04-27 MED ORDER — BUPROPION HCL ER (XL) 300 MG PO TB24: 300.0000 mg | ORAL_TABLET | Freq: Every day | ORAL | 1 refills | Status: DC

## 2019-05-02 ENCOUNTER — Ambulatory Visit: Payer: Medicare Other | Admitting: Pulmonary Disease

## 2019-05-04 ENCOUNTER — Ambulatory Visit: Payer: Medicare Other | Attending: Internal Medicine

## 2019-05-04 ENCOUNTER — Ambulatory Visit (INDEPENDENT_AMBULATORY_CARE_PROVIDER_SITE_OTHER): Payer: Medicare Other | Admitting: Psychology

## 2019-05-04 DIAGNOSIS — Z20822 Contact with and (suspected) exposure to covid-19: Secondary | ICD-10-CM

## 2019-05-04 DIAGNOSIS — F4323 Adjustment disorder with mixed anxiety and depressed mood: Secondary | ICD-10-CM | POA: Diagnosis not present

## 2019-05-05 LAB — NOVEL CORONAVIRUS, NAA: SARS-CoV-2, NAA: NOT DETECTED

## 2019-05-12 ENCOUNTER — Ambulatory Visit: Payer: Medicare Other | Attending: Internal Medicine

## 2019-05-12 DIAGNOSIS — Z23 Encounter for immunization: Secondary | ICD-10-CM | POA: Insufficient documentation

## 2019-05-12 NOTE — Progress Notes (Signed)
   Covid-19 Vaccination Clinic  Name:  Chelsey Jackson    MRN: [id number] DOB: 18-Feb-1951  05/12/2019  [name] was observed post Covid-19 immunization for 15 minutes without incidence. She was provided with Vaccine Information Sheet and instruction to access the V-Safe system.   [name] was instructed to call 911 with any severe reactions post vaccine: Marland Kitchen Difficulty breathing  . Swelling of your face and throat  . A fast heartbeat  . A bad rash all over your body  . Dizziness and weakness    Immunizations Administered    Name Date Dose VIS Date Route   Pfizer COVID-19 Vaccine 05/12/2019  2:19 PM 0.3 mL 04/01/2019 Intramuscular   Manufacturer: [place]   Lot: [id number]   [place]: [id number]

## 2019-05-18 ENCOUNTER — Ambulatory Visit (INDEPENDENT_AMBULATORY_CARE_PROVIDER_SITE_OTHER): Payer: Medicare Other | Admitting: Psychology

## 2019-05-18 DIAGNOSIS — F4323 Adjustment disorder with mixed anxiety and depressed mood: Secondary | ICD-10-CM | POA: Diagnosis not present

## 2019-05-26 ENCOUNTER — Encounter: Payer: Self-pay | Admitting: Family Medicine

## 2019-06-01 ENCOUNTER — Ambulatory Visit (INDEPENDENT_AMBULATORY_CARE_PROVIDER_SITE_OTHER): Payer: Medicare Other | Admitting: Psychology

## 2019-06-01 DIAGNOSIS — F4323 Adjustment disorder with mixed anxiety and depressed mood: Secondary | ICD-10-CM

## 2019-06-02 ENCOUNTER — Encounter: Payer: Self-pay | Admitting: Pulmonary Disease

## 2019-06-02 ENCOUNTER — Ambulatory Visit: Payer: Medicare Other | Attending: Internal Medicine

## 2019-06-02 ENCOUNTER — Ambulatory Visit (INDEPENDENT_AMBULATORY_CARE_PROVIDER_SITE_OTHER): Payer: Medicare Other | Admitting: Pulmonary Disease

## 2019-06-02 ENCOUNTER — Other Ambulatory Visit: Payer: Self-pay

## 2019-06-02 VITALS — BP 126/70 | HR 65 | Temp 97.1°F | Ht 68.0 in | Wt 188.8 lb

## 2019-06-02 DIAGNOSIS — Z23 Encounter for immunization: Secondary | ICD-10-CM | POA: Insufficient documentation

## 2019-06-02 DIAGNOSIS — Z9989 Dependence on other enabling machines and devices: Secondary | ICD-10-CM | POA: Diagnosis not present

## 2019-06-02 DIAGNOSIS — G4733 Obstructive sleep apnea (adult) (pediatric): Secondary | ICD-10-CM

## 2019-06-02 NOTE — Progress Notes (Signed)
   Covid-19 Vaccination Clinic  Name:  Chelsey Jackson    MRN: [id number] DOB: 01-21-1951  06/02/2019  [name] was observed post Covid-19 immunization for 15 minutes without incidence. She was provided with Vaccine Information Sheet and instruction to access the V-Safe system.   [name] was instructed to call 911 with any severe reactions post vaccine: Marland Kitchen Difficulty breathing  . Swelling of your face and throat  . A fast heartbeat  . A bad rash all over your body  . Dizziness and weakness    Immunizations Administered    Name Date Dose VIS Date Route   Pfizer COVID-19 Vaccine 06/02/2019  2:26 PM 0.3 mL 04/01/2019 Intramuscular   Manufacturer: [place]   Lot: [id number]   [place]: [id number]

## 2019-06-02 NOTE — Patient Instructions (Signed)
Will arrange for new auto CPAP machine  Follow up in 2 months

## 2019-06-02 NOTE — Progress Notes (Signed)
[facility]  Chief Complaint  Patient presents with  . Follow-up    OSA on CPAP    Constitutional:  BP 126/70 (BP Location: Right Arm, Patient Position: Sitting, Cuff Size: Large)   Pulse 65   Temp (!) 97.1 F (36.2 C)   Ht 5\' 8"  (1.727 m)   Wt 188 lb 12.8 oz (85.6 kg)   LMP  (LMP Unknown)   SpO2 99% Comment: on room air  BMI 28.71 kg/m   Past Medical History:  Allergies, Depression, Anxiety, Asthma, GERD, PUD, TMJ  Brief Summary:  Chelsey Jackson is a 69 y.o. female with obstructive sleep apnea.  She is doing well with CPAP.  Has noticed machine making some noises.  No issue with mask fit.  Not having dry mouth, sore throat, sinus congestion, or aerophagia.  Getting second COVID vaccine shot today.   Physical Exam:   Appearance - well kempt   ENMT - no sinus tenderness, no nasal discharge, no oral exudate  Neck - no masses, trachea midline, no thyromegaly, no elevation in JVP  Respiratory - normal appearance of chest wall, normal respiratory effort w/o accessory muscle use, no dullness on percussion, no wheezing or rales  CV - s1s2 regular rate and rhythm, no murmurs, no peripheral edema, radial pulses symmetric   Ext - no cyanosis, clubbing, or joint inflammation noted  Psych - normal mood and affect    Assessment/Plan:   Obstructive sleep apnea. - wasn't able to tolerate oral appliance previously - she is compliant with CPAP and reports benefit - Her current machine is not functioning properly, is more than 67 yrs old, and not amenable to repair. - will arrange for new auto CPAP range 5 to 15 cm H2O - reviewed alternative therapies for sleep apnea  COVID 19 advice. - reviewed side effects to monitor for after she gets 2nd vaccine dose   Patient Instructions  Will arrange for new auto CPAP machine  Follow up in 2 months  Time spent 22 minutes  [name], MD [place] Pager:  [phone] 06/02/2019, 10:03 AM  Flow Sheet    Sleep tests:  PSG 10/11/13 >> AHI 38.7, SaO2 low 74%.  CPAP 15 >> AHI 0, +R, +S.  Decreased SaO2 during REM. Auto CPAP 05/03/19 to 06/01/19 >> used on 30 of 30 nights with average 6 hrs 53 min.  Average AHI 1.3 with median CPAP 8 and 95 th percentile CPAP 11 cm H2O  Medications:   Allergies as of 06/02/2019      Reactions   Penicillins Anaphylaxis   Stopped breathing REACTION: stop breathing      Medication List       Accurate as of June 02, 2019 10:03 AM. If you have any questions, ask your nurse or doctor.        buPROPion 300 MG 24 hr tablet Commonly known as: WELLBUTRIN XL Take 1 tablet (300 mg total) by mouth daily.   Caltrate 600+D 600-400 MG-UNIT tablet Generic drug: Calcium Carbonate-Vitamin D Take 1 tablet by mouth daily.   diclofenac sodium 1 % Gel Commonly known as: VOLTAREN Apply 2-4 g topically 4 (four) times daily. What changed:   when to take this  reasons to take this   EPINEPHrine 0.3 mg/0.3 mL Soaj injection Commonly known as: EPI-PEN Inject into the muscle once.   fluticasone 50 MCG/ACT nasal spray Commonly known as: FLONASE Place 2 sprays into both nostrils daily.   levocetirizine 5  MG tablet Commonly known as: XYZAL Take 5 mg by mouth every evening.   multivitamin tablet Take 1 tablet by mouth daily.   ProAir RespiClick 123XX123 (90 Base) MCG/ACT Aepb Generic drug: Albuterol Sulfate as needed.   temazepam 30 MG capsule Commonly known as: RESTORIL Take 1 capsule (30 mg total) by mouth at bedtime as needed for sleep.   Vitron-C 65-125 MG Tabs Generic drug: Iron-Vitamin C Take 1 tablet by mouth daily.       Past Surgical History:  She  has a past surgical history that includes Tonsillectomy; Adenoidectomy; Abdominal hysterectomy (2003); Tubal ligation; Rhinoplasty (1984); Turbinate reduction (1997); Pubovaginal sling ('90's); Colonoscopy (03/10/2007); Rotator cuff repair (Right,  2013); Rotator cuff repair (Left, 2012); Shoulder surgery; Wrist surgery; Hammer toe surgery; Breast cyst aspiration; Colonoscopy with propofol (N/A, 06/29/2018); polypectomy (06/29/2018); and biopsy (06/29/2018).  Family History:  Her family history includes Breast cancer in her cousin and paternal aunt; Diabetes in her maternal grandmother and paternal grandfather; Fibroids in her mother; Heart disease in her father, maternal grandfather, and paternal grandmother; Lung cancer (age of onset: 45) in her mother; Osteoporosis in her mother; Other in her mother; Prostate cancer (age of onset: 70) in her father; Stomach cancer in her paternal grandfather; Testicular cancer (age of onset: 68) in her father.  Social History:  She  reports that she quit smoking about 29 years ago. Her smoking use included cigarettes. She has a 30.00 pack-year smoking history. She has never used smokeless tobacco. She reports current alcohol use. She reports that she does not use drugs.

## 2019-06-15 ENCOUNTER — Ambulatory Visit: Payer: Medicare Other

## 2019-06-15 ENCOUNTER — Ambulatory Visit (INDEPENDENT_AMBULATORY_CARE_PROVIDER_SITE_OTHER): Payer: Medicare Other | Admitting: Psychology

## 2019-06-15 DIAGNOSIS — F4323 Adjustment disorder with mixed anxiety and depressed mood: Secondary | ICD-10-CM | POA: Diagnosis not present

## 2019-06-23 ENCOUNTER — Other Ambulatory Visit: Payer: Self-pay | Admitting: Family Medicine

## 2019-06-23 NOTE — Telephone Encounter (Signed)
Pt requesting Bupropion HCL XL 300mg  tab   Last Office Visit: 02/09/2019 No upcoming app.  Approve?

## 2019-06-29 ENCOUNTER — Ambulatory Visit (INDEPENDENT_AMBULATORY_CARE_PROVIDER_SITE_OTHER): Payer: Medicare Other | Admitting: Psychology

## 2019-06-29 DIAGNOSIS — F4323 Adjustment disorder with mixed anxiety and depressed mood: Secondary | ICD-10-CM

## 2019-07-13 ENCOUNTER — Ambulatory Visit: Payer: Medicare Other | Admitting: Psychology

## 2019-07-14 NOTE — Telephone Encounter (Signed)
Called and spoke with patient. Chelsey Jackson has been rescheduled to see Dr. Halford Chessman on May 18th at 1030am. Chelsey Jackson is aware of the appointment. Nothing further needed at time of call.

## 2019-07-23 ENCOUNTER — Other Ambulatory Visit: Payer: Self-pay | Admitting: Family Medicine

## 2019-07-27 ENCOUNTER — Ambulatory Visit: Payer: Medicare Other | Admitting: Psychology

## 2019-08-08 ENCOUNTER — Ambulatory Visit: Payer: Medicare Other | Admitting: Pulmonary Disease

## 2019-08-10 ENCOUNTER — Ambulatory Visit (INDEPENDENT_AMBULATORY_CARE_PROVIDER_SITE_OTHER): Payer: Medicare Other | Admitting: Psychology

## 2019-08-10 DIAGNOSIS — F4323 Adjustment disorder with mixed anxiety and depressed mood: Secondary | ICD-10-CM | POA: Diagnosis not present

## 2019-08-24 ENCOUNTER — Ambulatory Visit (INDEPENDENT_AMBULATORY_CARE_PROVIDER_SITE_OTHER): Payer: Medicare Other | Admitting: Psychology

## 2019-08-24 DIAGNOSIS — F4323 Adjustment disorder with mixed anxiety and depressed mood: Secondary | ICD-10-CM | POA: Diagnosis not present

## 2019-09-06 ENCOUNTER — Ambulatory Visit: Payer: Medicare Other | Admitting: Pulmonary Disease

## 2019-09-07 ENCOUNTER — Ambulatory Visit (INDEPENDENT_AMBULATORY_CARE_PROVIDER_SITE_OTHER): Payer: Medicare Other | Admitting: Psychology

## 2019-09-07 DIAGNOSIS — F4323 Adjustment disorder with mixed anxiety and depressed mood: Secondary | ICD-10-CM | POA: Diagnosis not present

## 2019-09-19 ENCOUNTER — Other Ambulatory Visit: Payer: Self-pay | Admitting: Family Medicine

## 2019-09-21 ENCOUNTER — Ambulatory Visit (INDEPENDENT_AMBULATORY_CARE_PROVIDER_SITE_OTHER): Payer: Medicare Other | Admitting: Psychology

## 2019-09-21 DIAGNOSIS — F4323 Adjustment disorder with mixed anxiety and depressed mood: Secondary | ICD-10-CM

## 2019-09-23 ENCOUNTER — Other Ambulatory Visit: Payer: Self-pay

## 2019-09-23 ENCOUNTER — Encounter: Payer: Self-pay | Admitting: Pulmonary Disease

## 2019-09-23 ENCOUNTER — Ambulatory Visit (INDEPENDENT_AMBULATORY_CARE_PROVIDER_SITE_OTHER): Payer: Medicare Other | Admitting: Pulmonary Disease

## 2019-09-23 VITALS — BP 132/82 | HR 67 | Temp 97.3°F | Ht 68.0 in | Wt 190.0 lb

## 2019-09-23 DIAGNOSIS — G4733 Obstructive sleep apnea (adult) (pediatric): Secondary | ICD-10-CM

## 2019-09-23 NOTE — Patient Instructions (Signed)
Will have your auto CPAP change to 5 to 10 cm water  Can look up CPAP mask options at CPAP.com or similar website  Follow up in 1 year

## 2019-09-23 NOTE — Progress Notes (Signed)
[facility]  Chief Complaint  Patient presents with  . Follow-up    OSA on CPAP    Constitutional:  BP 132/82 (BP Location: Left Arm, Cuff Size: Normal)   Pulse 67   Temp (!) 97.3 F (36.3 C) (Oral)   Ht 5\' 8"  (1.727 m)   Wt 190 lb (86.2 kg)   LMP  (LMP Unknown)   SpO2 97%   BMI 28.89 kg/m   Past Medical History:  Allergies, Depression, Anxiety, Asthma, GERD, PUD, TMJ  Brief Summary:  Chelsey Jackson is a 69 y.o. female with obstructive sleep apnea.  Subjective:   She got new CPAP.  Pressure feels like it is running high.  Her mask feels like it is squeezing her face.  She has tried several mask types already.   Physical Exam:   Appearance - well kempt   ENMT - no sinus tenderness, no oral exudate, no LAN, Mallampati 4 airway, no stridor, scalloped tongue  Respiratory - equal breath sounds bilaterally, no wheezing or rales  CV - s1s2 regular rate and rhythm, no murmurs  Ext - no clubbing, no edema  Skin - no rashes  Psych - normal mood and affect   Assessment/Plan:   Obstructive sleep apnea. - wasn't able to tolerate oral appliance previously - she is compliant with CPAP - will narrow her auto CPAP range to 5 - 10 cm H2O - she will look up mask options on line and call if she finds one she would like to switch to  Follow up:   Patient Instructions  Will have your auto CPAP change to 5 to 10 cm water  Can look up CPAP mask options at CPAP.com or similar website  Follow up in 1 year  Signature:  [name], MD [place] Pager: [phone] 09/23/2019, 3:23 PM  Flow Sheet    Sleep tests:   PSG 10/11/13 >> AHI 38.7, SaO2 low 74%.  CPAP 15 >> AHI 0, +R, +S.  Decreased SaO2 during REM.  Auto CPAP 05/03/19 to 06/01/19 >> used on 30 of 30 nights with average 6 hrs 53 min.  Average AHI 1.3 with median CPAP 8 and 95 th percentile CPAP 11 cm H2O  Medications:   Allergies as of 09/23/2019       Reactions   Penicillins Anaphylaxis   Stopped breathing REACTION: stop breathing      Medication List       Accurate as of September 23, 2019  3:23 PM. If you have any questions, ask your nurse or doctor.        buPROPion 300 MG 24 hr tablet Commonly known as: WELLBUTRIN XL TAKE ONE TABLET BY MOUTH DAILY   Caltrate 600+D 600-400 MG-UNIT tablet Generic drug: Calcium Carbonate-Vitamin D Take 1 tablet by mouth daily.   diclofenac sodium 1 % Gel Commonly known as: VOLTAREN Apply 2-4 g topically 4 (four) times daily. What changed:   when to take this  reasons to take this   EPINEPHrine 0.3 mg/0.3 mL Soaj injection Commonly known as: EPI-PEN Inject into the muscle once.   fluticasone 50 MCG/ACT nasal spray Commonly known as: FLONASE Place 2 sprays into both nostrils daily.   levocetirizine 5 MG tablet Commonly known as: XYZAL Take 5 mg by mouth every evening.   multivitamin tablet Take 1 tablet by mouth daily.   ProAir RespiClick 017 (90 Base) MCG/ACT Aepb Generic drug: Albuterol Sulfate as needed.   temazepam 30 MG capsule  Commonly known as: RESTORIL TAKE 1 CAPSULE BY MOUTH AT BEDTIME AS NEEDED FOR SLEEP   Vitron-C 65-125 MG Tabs Generic drug: Iron-Vitamin C Take 1 tablet by mouth daily.       Past Surgical History:  She  has a past surgical history that includes Tonsillectomy; Adenoidectomy; Abdominal hysterectomy (2003); Tubal ligation; Rhinoplasty (1984); Turbinate reduction (1997); Pubovaginal sling ('90's); Colonoscopy (03/10/2007); Rotator cuff repair (Right, 2013); Rotator cuff repair (Left, 2012); Shoulder surgery; Wrist surgery; Hammer toe surgery; Breast cyst aspiration; Colonoscopy with propofol (N/A, 06/29/2018); polypectomy (06/29/2018); and biopsy (06/29/2018).  Family History:  Her family history includes Breast cancer in her cousin and paternal aunt; Diabetes in her maternal grandmother and paternal grandfather; Fibroids in her mother; Heart  disease in her father, maternal grandfather, and paternal grandmother; Lung cancer (age of onset: 75) in her mother; Osteoporosis in her mother; Other in her mother; Prostate cancer (age of onset: 104) in her father; Stomach cancer in her paternal grandfather; Testicular cancer (age of onset: 42) in her father.  Social History:  She  reports that she quit smoking about 29 years ago. Her smoking use included cigarettes. She has a 30.00 pack-year smoking history. She has never used smokeless tobacco. She reports current alcohol use. She reports that she does not use drugs.

## 2019-10-05 ENCOUNTER — Ambulatory Visit (INDEPENDENT_AMBULATORY_CARE_PROVIDER_SITE_OTHER): Payer: Medicare Other | Admitting: Psychology

## 2019-10-05 DIAGNOSIS — F4323 Adjustment disorder with mixed anxiety and depressed mood: Secondary | ICD-10-CM | POA: Diagnosis not present

## 2019-10-19 ENCOUNTER — Ambulatory Visit: Payer: Medicare Other | Admitting: Psychology

## 2019-10-27 ENCOUNTER — Other Ambulatory Visit: Payer: Self-pay | Admitting: Family Medicine

## 2019-10-27 NOTE — Telephone Encounter (Signed)
Pt requesting refill on Temazepam. Last OV with you 02/09/2019.

## 2019-11-02 ENCOUNTER — Ambulatory Visit (INDEPENDENT_AMBULATORY_CARE_PROVIDER_SITE_OTHER): Payer: Medicare Other | Admitting: Psychology

## 2019-11-02 DIAGNOSIS — F4323 Adjustment disorder with mixed anxiety and depressed mood: Secondary | ICD-10-CM

## 2019-11-15 ENCOUNTER — Ambulatory Visit (INDEPENDENT_AMBULATORY_CARE_PROVIDER_SITE_OTHER): Payer: Medicare Other | Admitting: Physician Assistant

## 2019-11-15 ENCOUNTER — Encounter: Payer: Self-pay | Admitting: Physician Assistant

## 2019-11-15 ENCOUNTER — Other Ambulatory Visit: Payer: Self-pay

## 2019-11-15 VITALS — BP 130/86 | HR 82 | Temp 97.7°F | Ht 68.0 in | Wt 191.2 lb

## 2019-11-15 DIAGNOSIS — R399 Unspecified symptoms and signs involving the genitourinary system: Secondary | ICD-10-CM

## 2019-11-15 LAB — POCT URINALYSIS DIPSTICK
Bilirubin, UA: NEGATIVE
Blood, UA: 3
Glucose, UA: NEGATIVE
Ketones, UA: NEGATIVE
Nitrite, UA: NEGATIVE
Protein, UA: POSITIVE — AB
Spec Grav, UA: >=1.03 — AB (ref 1.010–1.025)
Urobilinogen, UA: 0.2 E.U./dL
pH, UA: 5 (ref 5.0–8.0)

## 2019-11-15 MED ORDER — SULFAMETHOXAZOLE-TRIMETHOPRIM 800-160 MG PO TABS: 1.0000 | ORAL_TABLET | Freq: Two times a day (BID) | ORAL | 0 refills | Status: AC

## 2019-11-15 NOTE — Progress Notes (Signed)
Chelsey Jackson is a 69 y.o. female here for a new problem.  I acted as a Education administrator for [employer], PA-C Anselmo Pickler, LPN  History of Present Illness:   Chief Complaint  Patient presents with  . Dysuria    HPI  Dysuria Pt c/o burning and frequency with urination started on Sunday. Pt saw some pink tinge with wiping. Denies back pain, no fever or chills. Pt has not taken anything for her symptoms. Last night she had a two hour stretch where she had to urinate every 5-7 minutes and each time there was only 3 drops that would come out. She had significant pain that radiated throughout her body, felt like she could not get comfortable.  When she woke up this morning her pain was significantly improved. Denies nausea, fever.  Recently drove from [place] over the weekend.  Past Medical History:  Diagnosis Date  . 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 Allergy    weekly allergy shots  . Anxiety   . Arthritis   . Asthma   . Complication of anesthesia    difficult intubation  . Depression   . Fibrocystic breast disease   . GERD (gastroesophageal reflux disease)   . OSA (obstructive sleep apnea) 07/31/2013  . Peptic ulcer disease   . TMJ locking      Social History   Tobacco Use  . Smoking status: Former Smoker    Packs/day: 2.00    Years: 15.00    Pack years: 30.00    Types: Cigarettes    Quit date: 11/19/1989    Years since quitting: 30.0  . Smokeless tobacco: Never Used  Vaping Use  . Vaping Use: Never used  Substance Use Topics  . Alcohol use: Yes    Comment: maybe a glass of wine on the weekend  . Drug use: No    Past Surgical History:  Procedure Laterality Date  . ABDOMINAL HYSTERECTOMY  2003   due to fibroids  . ADENOIDECTOMY     x3  . BIOPSY  06/29/2018   Procedure: BIOPSY;  Surgeon: Lavena Bullion, DO;  Location: [facility];  Service: Gastroenterology;;  . BREAST CYST ASPIRATION    .  COLONOSCOPY  03/10/2007   results-normal  . COLONOSCOPY WITH PROPOFOL N/A 06/29/2018   Procedure: COLONOSCOPY WITH PROPOFOL;  Surgeon: Lavena Bullion, DO;  Location: [facility];  Service: Gastroenterology;  Laterality: N/A;  . HAMMER TOE SURGERY    . POLYPECTOMY  06/29/2018   Procedure: POLYPECTOMY;  Surgeon: Lavena Bullion, DO;  Location: [facility];  Service: Gastroenterology;;  . Lavonda Jumbo  '90's   for incontinence  . RHINOPLASTY  1984  . ROTATOR CUFF REPAIR Right 2013   [place]  . ROTATOR CUFF REPAIR Left 2012   [place]  . SHOULDER SURGERY    . TONSILLECTOMY     x2  . TUBAL LIGATION    . [place]  . WRIST SURGERY      Family History  Problem Relation Age of Onset  . Osteoporosis Mother   . Fibroids Mother   . Other Mother        aortic valve replacement 1994, abdominal aortic aneurysm  . Lung cancer Mother 68  . Heart disease Father   . Testicular cancer Father 21  . Prostate cancer Father 1  . Diabetes Maternal Grandmother   . Heart disease Maternal Grandfather   . Heart disease  Paternal Grandmother   . Diabetes Paternal Grandfather   . Stomach cancer Paternal Grandfather   . Breast cancer Paternal Aunt   . Breast cancer Cousin   . Colon cancer Neg Hx   . Throat cancer Neg Hx   . Pancreatic cancer Neg Hx     Allergies  Allergen Reactions  . Penicillins Anaphylaxis    Stopped breathing REACTION: stop breathing    Current Medications:   Current Outpatient Medications:  .  buPROPion (WELLBUTRIN XL) 300 MG 24 hr tablet, TAKE ONE TABLET BY MOUTH DAILY, Disp: 90 tablet, Rfl: 0 .  Calcium Carbonate-Vitamin D (CALTRATE 600+D) 600-400 MG-UNIT per tablet, Take 1 tablet by mouth daily.  , Disp: , Rfl:  .  diclofenac sodium (VOLTAREN) 1 % GEL, Apply 2-4 g topically 4 (four) times daily. (Patient taking differently: Apply 2-4 g topically 4 (four) times daily as needed. ), Disp: 6 Tube, Rfl: 2 .  EPINEPHrine 0.3 mg/0.3 mL IJ SOAJ  injection, Inject into the muscle once., Disp: , Rfl:  .  fluticasone (FLONASE) 50 MCG/ACT nasal spray, Place 2 sprays into both nostrils daily. , Disp: , Rfl:  .  Iron-Vitamin C (VITRON-C) 65-125 MG TABS, Take 1 tablet by mouth daily., Disp: , Rfl:  .  levocetirizine (XYZAL) 5 MG tablet, Take 5 mg by mouth every evening., Disp: , Rfl:  .  Multiple Vitamin (MULTIVITAMIN) tablet, Take 1 tablet by mouth daily.  , Disp: , Rfl:  .  PROAIR RESPICLICK 347 (90 Base) MCG/ACT AEPB, as needed. , Disp: , Rfl:  .  temazepam (RESTORIL) 30 MG capsule, TAKE ONE CAPSULE BY MOUTH EVERY NIGHT AT BEDTIME AS NEEDED FOR SLEEP, Disp: 90 capsule, Rfl: 0 .  sulfamethoxazole-trimethoprim (BACTRIM DS) 800-160 MG tablet, Take 1 tablet by mouth 2 (two) times daily for 3 days., Disp: 6 tablet, Rfl: 0  Current Facility-Administered Medications:  .  0.9 %  sodium chloride infusion, 500 mL, Intravenous, Once, [place], MD   Review of Systems:   ROS  Negative unless otherwise specified per HPI.  Vitals:   Vitals:   11/15/19 1005  BP: (!) 130/86  Pulse: 82  Temp: 97.7 F (36.5 C)  TempSrc: Temporal  SpO2: 98%  Weight: 191 lb 4 oz (86.8 kg)  Height: 5\' 8"  (1.727 m)     Body mass index is 29.08 kg/m.  Physical Exam:   Physical Exam Vitals and nursing note reviewed.  Constitutional:      General: She is not in acute distress.    Appearance: She is well-developed. She is not ill-appearing or toxic-appearing.  Cardiovascular:     Rate and Rhythm: Normal rate and regular rhythm.     Pulses: Normal pulses.     Heart sounds: Normal heart sounds, S1 normal and S2 normal.     Comments: No LE edema Pulmonary:     Effort: Pulmonary effort is normal.     Breath sounds: Normal breath sounds.  Abdominal:     General: Abdomen is flat.     Tenderness: There is no right CVA tenderness or left CVA tenderness.  Skin:    General: Skin is warm and dry.  Neurological:     Mental Status: She is alert.     GCS:  GCS eye subscore is 4. GCS verbal subscore is 5. GCS motor subscore is 6.  Psychiatric:        Speech: Speech normal.        Behavior: Behavior normal. Behavior is cooperative.  Results for orders placed or performed in visit on 11/15/19  POCT Urinalysis Dipstick  Result Value Ref Range   Color, UA amber    Clarity, UA clear    Glucose, UA Negative Negative   Bilirubin, UA Negative    Ketones, UA Negative    Spec Grav, UA >=1.030 (A) 1.010 - 1.025   Blood, UA 3    pH, UA 5.0 5.0 - 8.0   Protein, UA Positive (A) Negative   Urobilinogen, UA 0.2 0.2 or 1.0 E.U./dL   Nitrite, UA Negative    Leukocytes, UA Small (1+) (A) Negative   Appearance     Odor      Assessment and Plan:   Yina was seen today for dysuria.  Diagnoses and all orders for this visit:  UTI symptoms -     POCT Urinalysis Dipstick -     Urine Culture; Future -     Urine Culture  Other orders -     sulfamethoxazole-trimethoprim (BACTRIM DS) 800-160 MG tablet; Take 1 tablet by mouth 2 (two) times daily for 3 days.   No red flags on exam. Discussed possibility of kidney stone vs UTI. Will start oral bactrim, as she is PCN allergic. Recommended continued good hydration with close follow-up if symptoms worsen or persist. We briefly discussed use of flomax but she would like to decline at this time. Discussed that if symptoms worsen or do not improve, we will likely need to obtain CT scan.  Urine culture also ordered. Will reach out to patient with the result and any modification to plan.  . Reviewed expectations re: course of current medical issues. . Discussed self-management of symptoms. . Outlined signs and symptoms indicating need for more acute intervention. . Patient verbalized understanding and all questions were answered. . See orders for this visit as documented in the electronic medical record. . Patient received an After-Visit Summary.  CMA or LPN served as scribe during this visit. History,  Physical, and Plan performed by medical provider. The above documentation has been reviewed and is accurate and complete.  [name], PA-C

## 2019-11-15 NOTE — Patient Instructions (Signed)
It was good to see you.  We are starting an antibiotic for possible UTI. Let me know if things get worse or do not improve in a few days and we will obtain CT scan for possible kidney stone.  General instructions  Make sure you: ? Pee until your bladder is empty. ? Do not hold pee for a long time. ? Empty your bladder after sex. ? Wipe from front to back after pooping if you are a female. Use each tissue one time when you wipe.  Drink enough fluid to keep your pee pale yellow.  Keep all follow-up visits as told by your doctor. This is important. Contact a doctor if:  You do not get better after 1-2 days.  Your symptoms go away and then come back. Get help right away if:  You have very bad back pain.  You have very bad pain in your lower belly.  You have a fever.  You are sick to your stomach (nauseous).  You are throwing up.

## 2019-11-16 ENCOUNTER — Ambulatory Visit: Payer: Medicare Other | Admitting: Psychology

## 2019-11-18 LAB — URINE CULTURE
MICRO NUMBER:: 10754231
SPECIMEN QUALITY:: ADEQUATE

## 2019-11-30 ENCOUNTER — Ambulatory Visit: Payer: Medicare Other | Admitting: Psychology

## 2019-12-06 ENCOUNTER — Other Ambulatory Visit: Payer: Self-pay | Admitting: Obstetrics & Gynecology

## 2019-12-06 DIAGNOSIS — Z1231 Encounter for screening mammogram for malignant neoplasm of breast: Secondary | ICD-10-CM

## 2019-12-14 ENCOUNTER — Other Ambulatory Visit: Payer: Self-pay | Admitting: Obstetrics & Gynecology

## 2019-12-14 ENCOUNTER — Ambulatory Visit (INDEPENDENT_AMBULATORY_CARE_PROVIDER_SITE_OTHER): Payer: Medicare Other | Admitting: Psychology

## 2019-12-14 DIAGNOSIS — F4323 Adjustment disorder with mixed anxiety and depressed mood: Secondary | ICD-10-CM | POA: Diagnosis not present

## 2019-12-14 DIAGNOSIS — Z1231 Encounter for screening mammogram for malignant neoplasm of breast: Secondary | ICD-10-CM

## 2019-12-16 ENCOUNTER — Other Ambulatory Visit: Payer: Self-pay

## 2019-12-16 ENCOUNTER — Other Ambulatory Visit: Payer: Self-pay | Admitting: Family Medicine

## 2019-12-16 NOTE — Telephone Encounter (Signed)
Sent to pharmacy 

## 2019-12-20 ENCOUNTER — Ambulatory Visit
Admission: RE | Admit: 2019-12-20 | Discharge: 2019-12-20 | Disposition: A | Discharge status: Home / Self Care | Payer: Medicare Other | Source: Ambulatory Visit

## 2019-12-20 ENCOUNTER — Other Ambulatory Visit: Payer: Self-pay

## 2019-12-20 DIAGNOSIS — Z1231 Encounter for screening mammogram for malignant neoplasm of breast: Secondary | ICD-10-CM

## 2019-12-21 ENCOUNTER — Ambulatory Visit: Payer: Medicare Other | Admitting: Orthopaedic Surgery

## 2019-12-28 ENCOUNTER — Ambulatory Visit (INDEPENDENT_AMBULATORY_CARE_PROVIDER_SITE_OTHER): Payer: Medicare Other | Admitting: Psychology

## 2019-12-28 DIAGNOSIS — F4323 Adjustment disorder with mixed anxiety and depressed mood: Secondary | ICD-10-CM

## 2020-01-03 ENCOUNTER — Telehealth: Payer: Self-pay | Admitting: Family Medicine

## 2020-01-03 NOTE — Telephone Encounter (Signed)
Patient is calling in asking if she is up to date on her pneumonia vaccine

## 2020-01-04 NOTE — Telephone Encounter (Signed)
She had one in 2015, and one in 2020 does she need one more or is she fine?  Please Advise.

## 2020-01-04 NOTE — Telephone Encounter (Signed)
Looks like she needs her pneumovax 23 again. She had in 2015 before age 68. Guidelines are if before age 69 give one more dose after age 52 years and 5 years from previous dose.  She can just do at next visit or come in for nurse visit.   Thanks- Dr. Rogers Blocker

## 2020-01-05 NOTE — Telephone Encounter (Signed)
Patient is scheduled for nurse visit next week.

## 2020-01-05 NOTE — Telephone Encounter (Signed)
Notated.

## 2020-01-11 ENCOUNTER — Other Ambulatory Visit: Payer: Self-pay

## 2020-01-11 ENCOUNTER — Ambulatory Visit (INDEPENDENT_AMBULATORY_CARE_PROVIDER_SITE_OTHER): Payer: Medicare Other | Admitting: *Deleted

## 2020-01-11 ENCOUNTER — Encounter: Payer: Self-pay | Admitting: Family Medicine

## 2020-01-11 ENCOUNTER — Ambulatory Visit (INDEPENDENT_AMBULATORY_CARE_PROVIDER_SITE_OTHER): Payer: Medicare Other | Admitting: Psychology

## 2020-01-11 DIAGNOSIS — F4323 Adjustment disorder with mixed anxiety and depressed mood: Secondary | ICD-10-CM | POA: Diagnosis not present

## 2020-01-11 DIAGNOSIS — Z23 Encounter for immunization: Secondary | ICD-10-CM

## 2020-01-11 NOTE — Progress Notes (Signed)
Patient present for Pneumonia vaccine and Flu vaccine  Vaccine given  Pt tolerated well

## 2020-01-27 ENCOUNTER — Other Ambulatory Visit: Payer: Self-pay | Admitting: Family Medicine

## 2020-02-08 ENCOUNTER — Ambulatory Visit (INDEPENDENT_AMBULATORY_CARE_PROVIDER_SITE_OTHER): Payer: Medicare Other | Admitting: Psychology

## 2020-02-08 DIAGNOSIS — F4323 Adjustment disorder with mixed anxiety and depressed mood: Secondary | ICD-10-CM | POA: Diagnosis not present

## 2020-02-21 ENCOUNTER — Ambulatory Visit (INDEPENDENT_AMBULATORY_CARE_PROVIDER_SITE_OTHER): Payer: Medicare Other | Admitting: Psychology

## 2020-02-21 DIAGNOSIS — F4323 Adjustment disorder with mixed anxiety and depressed mood: Secondary | ICD-10-CM | POA: Diagnosis not present

## 2020-02-22 ENCOUNTER — Ambulatory Visit: Payer: Medicare Other | Admitting: Psychology

## 2020-03-06 ENCOUNTER — Ambulatory Visit (INDEPENDENT_AMBULATORY_CARE_PROVIDER_SITE_OTHER): Payer: Medicare Other | Admitting: Psychology

## 2020-03-06 DIAGNOSIS — F4323 Adjustment disorder with mixed anxiety and depressed mood: Secondary | ICD-10-CM | POA: Diagnosis not present

## 2020-03-07 ENCOUNTER — Ambulatory Visit: Payer: Medicare Other | Admitting: Psychology

## 2020-03-20 ENCOUNTER — Ambulatory Visit: Payer: Medicare Other | Admitting: Psychology

## 2020-03-21 ENCOUNTER — Ambulatory Visit: Payer: Medicare Other | Admitting: Psychology

## 2020-03-30 DIAGNOSIS — G4733 Obstructive sleep apnea (adult) (pediatric): Secondary | ICD-10-CM

## 2020-04-02 NOTE — Telephone Encounter (Signed)
Order placed. Nothing further needed at this time. 

## 2020-04-02 NOTE — Telephone Encounter (Signed)
Dr. Halford Chessman received an email from patient stating that she still feels her pressure setting is still too strong. I looked at her RX on her machine through [facility] and it is set for 5-10cm.   Dr. Halford Chessman please advise on patient's pressure setting.

## 2020-04-02 NOTE — Telephone Encounter (Signed)
Please have her DME change her auto CPAP to 5 to 7 cm H2O.

## 2020-04-03 ENCOUNTER — Ambulatory Visit (INDEPENDENT_AMBULATORY_CARE_PROVIDER_SITE_OTHER): Payer: Medicare Other

## 2020-04-03 ENCOUNTER — Encounter: Payer: Self-pay | Admitting: Orthopaedic Surgery

## 2020-04-03 ENCOUNTER — Other Ambulatory Visit: Payer: Self-pay

## 2020-04-03 ENCOUNTER — Ambulatory Visit (INDEPENDENT_AMBULATORY_CARE_PROVIDER_SITE_OTHER): Payer: Medicare Other | Admitting: Psychology

## 2020-04-03 ENCOUNTER — Ambulatory Visit (INDEPENDENT_AMBULATORY_CARE_PROVIDER_SITE_OTHER): Payer: Medicare Other | Admitting: Orthopaedic Surgery

## 2020-04-03 VITALS — Ht 68.0 in | Wt 190.0 lb

## 2020-04-03 DIAGNOSIS — G8929 Other chronic pain: Secondary | ICD-10-CM

## 2020-04-03 DIAGNOSIS — F4323 Adjustment disorder with mixed anxiety and depressed mood: Secondary | ICD-10-CM

## 2020-04-03 DIAGNOSIS — M7542 Impingement syndrome of left shoulder: Secondary | ICD-10-CM

## 2020-04-03 DIAGNOSIS — M25512 Pain in left shoulder: Secondary | ICD-10-CM

## 2020-04-03 NOTE — Progress Notes (Signed)
Office Visit Note   Patient: Chelsey Jackson           Date of Birth: 05-21-1950           MRN: [id number] Visit Date: 04/03/2020              Requested by: Orma Flaming, [address] [place],  [place] [address] PCP: Orma Flaming, MD   Assessment & Plan: Visit Diagnoses:  1. Chronic left shoulder pain   2. Impingement syndrome of left shoulder     Plan:  #1: At this time I did a subacromial injection to the left shoulder.  She had relief in her symptoms but not complete as she would move into different arcs.  Were going to see how she does.  She does not have continue relief and she will give [place] a call we will schedule her for an MRI scan of the left shoulder in the future.  Follow-Up Instructions: Return if symptoms worsen or fail to improve, for call for MRI if not better.   Orders:  Orders Placed This Encounter  Procedures  . XR Shoulder Left   No orders of the defined types were placed in this encounter.     Procedures: No procedures performed   Clinical Data: No additional findings.   Subjective: Chief Complaint  Patient presents with  . Left Shoulder - Pain   HPI: Patient presents today for left shoulder pain. She said that it has hurt for a long time, but has worsened in the last 6-8weeks. No injury. She said that it hurts all throughout her shoulder. The pain is constant, and activity or rest does not seem to affect it. No numbness or tingling. She has tried Voltaren gel, with no relief. She has a history of a rotator cuff tear repair with Dr.Whitfield around 6-7years ago. She is right hand dominant.    Review of Systems   Objective: Vital Signs: Ht 5\' 8"  (1.727 m)   Wt 190 lb (86.2 kg)   LMP  (LMP Unknown)   BMI 28.89 kg/m   Physical Exam Constitutional:      Appearance: Normal appearance. She is well-developed, normal weight and well-nourished.  HENT:     Head: Normocephalic.     Nose: Nose normal.     Mouth/Throat:     Mouth:  Oropharynx is clear and moist.  Eyes:     Extraocular Movements: EOM normal.     Pupils: Pupils are equal, round, and reactive to light.  Pulmonary:     Effort: Pulmonary effort is normal.  Skin:    General: Skin is warm and dry.  Neurological:     Mental Status: She is alert and oriented to person, place, and time.  Psychiatric:        Mood and Affect: Mood and affect and mood normal.        Behavior: Behavior normal.        Thought Content: Thought content normal.        Judgment: Judgment normal.       Ortho Exam  Exam today reveals range of motion with forward flexion to about 170 degrees.  Abduction to about 150 degrees.  With the arm at 90 degrees of abduction she is able to get around 70 degrees of external rotation and around 40 degrees of internal rotation.  Positive empty can test.  She has little bit of breakaway weakness in abduction.  Specialty Comments:  No specialty comments available.  Imaging: XR Shoulder Left  Result Date: 04/03/2020 4 view x-ray of the left shoulder reveals a type I acromium and an adequate distal clavicle excision.  She is maintaining a glenohumeral space.  No spurring is noted.    PMFS History: Current Outpatient Medications  Medication Sig Dispense Refill  . buPROPion (WELLBUTRIN XL) 300 MG 24 hr tablet TAKE ONE TABLET BY MOUTH DAILY 90 tablet 0  . Calcium Carbonate-Vitamin D 600-400 MG-UNIT tablet Take 1 tablet by mouth daily.    . diclofenac sodium (VOLTAREN) 1 % GEL Apply 2-4 g topically 4 (four) times daily. (Patient taking differently: Apply 2-4 g topically 4 (four) times daily as needed.) 6 Tube 2  . EPINEPHrine 0.3 mg/0.3 mL IJ SOAJ injection Inject into the muscle once.    . fluticasone (FLONASE) 50 MCG/ACT nasal spray Place 2 sprays into both nostrils daily.     . Iron-Vitamin C 65-125 MG TABS Take 1 tablet by mouth daily.    Marland Kitchen levocetirizine (XYZAL) 5 MG tablet Take 5 mg by mouth every evening.    . Multiple Vitamin  (MULTIVITAMIN) tablet Take 1 tablet by mouth daily.    Marland Kitchen PROAIR RESPICLICK 814 (90 Base) MCG/ACT AEPB as needed.     . temazepam (RESTORIL) 30 MG capsule TAKE ONE CAPSULE BY MOUTH EVERY NIGHT AT BEDTIME AS NEEDED FOR SLEEP 90 capsule 0   Current Facility-Administered Medications  Medication Dose Route Frequency Provider Last Rate Last Admin  . 0.9 %  sodium chloride infusion  500 mL Intravenous Once Thornton Park, MD        Patient Active Problem List   Diagnosis Date Noted  . Impingement syndrome of left shoulder 04/03/2020  . Urge incontinence 11/05/2018  . Diverticulosis of colon without hemorrhage   . Primary osteoarthritis of first carpometacarpal joint of left hand 06/22/2018  . Chronic pain of left thumb 06/22/2018  . History of retinal tear 10/20/2016  . Low back pain 09/28/2015  . Insomnia 08/18/2013  . OSA (obstructive sleep apnea) 07/31/2013  . Thyroid nodule, cold 07/15/2012  . Anxiety and depression 10/05/2007  . Allergic rhinitis due to pollen 10/05/2007  . Asthma 10/05/2007   Past Medical History:  Diagnosis Date  . 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 Allergy    weekly allergy shots  . Anxiety   . Arthritis   . Asthma   . Complication of anesthesia    difficult intubation  . Depression   . Fibrocystic breast disease   . GERD (gastroesophageal reflux disease)   . OSA (obstructive sleep apnea) 07/31/2013  . Peptic ulcer disease   . TMJ locking     Family History  Problem Relation Age of Onset  . Osteoporosis Mother   . Fibroids Mother   . Other Mother        aortic valve replacement 1994, abdominal aortic aneurysm  . Lung cancer Mother 5  . Heart disease Father   . Testicular cancer Father 47  . Prostate cancer Father 49  . Diabetes Maternal Grandmother   . Heart disease Maternal Grandfather   . Heart disease Paternal Grandmother   . Diabetes Paternal Grandfather   . Stomach cancer  Paternal Grandfather   . Breast cancer Paternal Aunt   . Breast cancer Cousin   . Colon cancer Neg Hx   . Throat cancer Neg Hx   . Pancreatic cancer Neg Hx     Past Surgical History:  Procedure Laterality Date  . ABDOMINAL HYSTERECTOMY  2003   due to fibroids  . ADENOIDECTOMY     x3  . BIOPSY  06/29/2018   Procedure: BIOPSY;  Surgeon: Lavena Bullion, DO;  Location: [facility];  Service: Gastroenterology;;  . BREAST CYST ASPIRATION    . COLONOSCOPY  03/10/2007   results-normal  . COLONOSCOPY WITH PROPOFOL N/A 06/29/2018   Procedure: COLONOSCOPY WITH PROPOFOL;  Surgeon: Lavena Bullion, DO;  Location: [facility];  Service: Gastroenterology;  Laterality: N/A;  . HAMMER TOE SURGERY    . POLYPECTOMY  06/29/2018   Procedure: POLYPECTOMY;  Surgeon: Lavena Bullion, DO;  Location: [facility];  Service: Gastroenterology;;  . Lavonda Jumbo  '90's   for incontinence  . RHINOPLASTY  1984  . ROTATOR CUFF REPAIR Right 2013   [place]  . ROTATOR CUFF REPAIR Left 2012   [place]  . SHOULDER SURGERY    . TONSILLECTOMY     x2  . TUBAL LIGATION    . [place]  . WRIST SURGERY     Social History   Occupational History  . Not on file  Tobacco Use  . Smoking status: Former Smoker    Packs/day: 2.00    Years: 15.00    Pack years: 30.00    Types: Cigarettes    Quit date: 11/19/1989    Years since quitting: 30.3  . Smokeless tobacco: Never Used  Vaping Use  . Vaping Use: Never used  Substance and Sexual Activity  . Alcohol use: Yes    Comment: maybe a glass of wine on the weekend  . Drug use: No  . Sexual activity: Not on file

## 2020-04-04 ENCOUNTER — Ambulatory Visit: Payer: Medicare Other | Admitting: Psychology

## 2020-04-15 ENCOUNTER — Other Ambulatory Visit: Payer: Self-pay | Admitting: Family Medicine

## 2020-04-17 ENCOUNTER — Other Ambulatory Visit: Payer: Self-pay

## 2020-04-17 ENCOUNTER — Ambulatory Visit: Payer: Medicare Other | Admitting: Psychology

## 2020-04-17 DIAGNOSIS — G8929 Other chronic pain: Secondary | ICD-10-CM

## 2020-04-17 DIAGNOSIS — M25512 Pain in left shoulder: Secondary | ICD-10-CM

## 2020-04-18 ENCOUNTER — Ambulatory Visit: Payer: Medicare Other | Admitting: Psychology

## 2020-04-23 ENCOUNTER — Ambulatory Visit: Payer: Medicare Other

## 2020-04-24 ENCOUNTER — Ambulatory Visit: Payer: Medicare Other | Admitting: Orthopaedic Surgery

## 2020-04-28 ENCOUNTER — Other Ambulatory Visit: Payer: Self-pay | Admitting: Family Medicine

## 2020-05-01 ENCOUNTER — Ambulatory Visit (INDEPENDENT_AMBULATORY_CARE_PROVIDER_SITE_OTHER): Payer: Medicare Other | Admitting: Psychology

## 2020-05-01 DIAGNOSIS — F4323 Adjustment disorder with mixed anxiety and depressed mood: Secondary | ICD-10-CM

## 2020-05-02 ENCOUNTER — Ambulatory Visit: Payer: Medicare Other | Admitting: Psychology

## 2020-05-09 ENCOUNTER — Ambulatory Visit
Admission: RE | Admit: 2020-05-09 | Discharge: 2020-05-09 | Disposition: A | Discharge status: Home / Self Care | Payer: Medicare Other | Source: Ambulatory Visit | Attending: Orthopaedic Surgery | Admitting: Orthopaedic Surgery

## 2020-05-09 ENCOUNTER — Other Ambulatory Visit: Payer: Self-pay

## 2020-05-09 DIAGNOSIS — G8929 Other chronic pain: Secondary | ICD-10-CM

## 2020-05-10 ENCOUNTER — Ambulatory Visit: Payer: Medicare Other | Admitting: Family Medicine

## 2020-05-15 ENCOUNTER — Ambulatory Visit (INDEPENDENT_AMBULATORY_CARE_PROVIDER_SITE_OTHER): Payer: Medicare Other | Admitting: Orthopaedic Surgery

## 2020-05-15 ENCOUNTER — Other Ambulatory Visit: Payer: Self-pay

## 2020-05-15 ENCOUNTER — Encounter: Payer: Self-pay | Admitting: Orthopaedic Surgery

## 2020-05-15 ENCOUNTER — Ambulatory Visit (INDEPENDENT_AMBULATORY_CARE_PROVIDER_SITE_OTHER): Payer: Medicare Other | Admitting: Psychology

## 2020-05-15 DIAGNOSIS — M7542 Impingement syndrome of left shoulder: Secondary | ICD-10-CM | POA: Diagnosis not present

## 2020-05-15 DIAGNOSIS — M25512 Pain in left shoulder: Secondary | ICD-10-CM | POA: Diagnosis not present

## 2020-05-15 DIAGNOSIS — F4323 Adjustment disorder with mixed anxiety and depressed mood: Secondary | ICD-10-CM

## 2020-05-15 DIAGNOSIS — G8929 Other chronic pain: Secondary | ICD-10-CM

## 2020-05-15 NOTE — Progress Notes (Signed)
Office Visit Note   Patient: Chelsey Jackson           Date of Birth: July 06, 1950           MRN: [id number] Visit Date: 05/15/2020              Requested by: Orma Flaming, [address] [place],  [place] [address] PCP: Orma Flaming, MD   Assessment & Plan: Visit Diagnoses:  1. Chronic left shoulder pain   2. Impingement syndrome of left shoulder     Plan: Chelsey Jackson had an MRI scan of her left shoulder demonstrating rotator cuff tendinopathy with a 6.7 cm from front to back deep bursal sided tear of the posterior supraspinatus.  The tear was in the critical zone with a gap between tendon fragments of 1 to 2 cm.  No atrophy.  Moderate glenohumeral osteoarthritis.  Had prior surgery for at least 5 years ago and is had a successful acromioplasty and resection of the [facility] joint.  Some subdeltoid and subacromial fluid suggestive of bursitis.  All of this discussed with Chelsey Jackson.  Able to place her arm fully overhead without any problem.  Minimal discomfort with impingement testing.  No grinding or grating.  I think is worth trying a course of physical therapy before we consider surgical intervention.  She agrees.  All questions were answered we will plan to see her back in 4 to 6 weeks  Follow-Up Instructions: Return in about 6 weeks (around 06/26/2020).   Orders:  Orders Placed This Encounter  Procedures  . Ambulatory referral to Physical Therapy   No orders of the defined types were placed in this encounter.     Procedures: No procedures performed   Clinical Data: No additional findings.   Subjective: Chief Complaint  Patient presents with  . Left Shoulder - Pain    F/u MRI Review  Experienced onset of right correction left shoulder pain in early December.  She had a subacromial cortisone injection without much relief.  MRI scan was performed and she is here for the results.  She has had a prior shoulder arthroscopy about 5 years ago and did well  HPI  Review  of Systems   Objective: Vital Signs: LMP  (LMP Unknown)   Physical Exam Constitutional:      Appearance: She is well-developed and well-nourished.  HENT:     Mouth/Throat:     Mouth: Oropharynx is clear and moist.  Eyes:     Extraocular Movements: EOM normal.     Pupils: Pupils are equal, round, and reactive to light.  Pulmonary:     Effort: Pulmonary effort is normal.  Skin:    General: Skin is warm and dry.  Neurological:     Mental Status: She is alert and oriented to person, place, and time.  Psychiatric:        Mood and Affect: Mood and affect normal.        Behavior: Behavior normal.     Ortho Exam awake alert and oriented x3.  Comfortable sitting.  Able to place left arm overhead quickly without a circuitous arc of motion.  Abduction over 90 degrees.  Minimal pain with impingement testing.  Negative empty can testing negative Speed sign.  Skin intact.  Biceps intact.  No tenderness about the shoulder  Specialty Comments:  No specialty comments available.  Imaging: No results found.   PMFS History: Patient Active Problem List   Diagnosis Date Noted  . Impingement syndrome  of left shoulder 04/03/2020  . Urge incontinence 11/05/2018  . Diverticulosis of colon without hemorrhage   . Primary osteoarthritis of first carpometacarpal joint of left hand 06/22/2018  . Chronic pain of left thumb 06/22/2018  . History of retinal tear 10/20/2016  . Low back pain 09/28/2015  . Insomnia 08/18/2013  . OSA (obstructive sleep apnea) 07/31/2013  . Thyroid nodule, cold 07/15/2012  . Anxiety and depression 10/05/2007  . Allergic rhinitis due to pollen 10/05/2007  . Asthma 10/05/2007   Past Medical History:  Diagnosis Date  . 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 Allergy    weekly allergy shots  . Anxiety   . Arthritis   . Asthma   . Complication of anesthesia    difficult intubation  . Depression   .  Fibrocystic breast disease   . GERD (gastroesophageal reflux disease)   . OSA (obstructive sleep apnea) 07/31/2013  . Peptic ulcer disease   . TMJ locking     Family History  Problem Relation Age of Onset  . Osteoporosis Mother   . Fibroids Mother   . Other Mother        aortic valve replacement 1994, abdominal aortic aneurysm  . Lung cancer Mother 49  . Heart disease Father   . Testicular cancer Father 58  . Prostate cancer Father 37  . Diabetes Maternal Grandmother   . Heart disease Maternal Grandfather   . Heart disease Paternal Grandmother   . Diabetes Paternal Grandfather   . Stomach cancer Paternal Grandfather   . Breast cancer Paternal Aunt   . Breast cancer Cousin   . Colon cancer Neg Hx   . Throat cancer Neg Hx   . Pancreatic cancer Neg Hx     Past Surgical History:  Procedure Laterality Date  . ABDOMINAL HYSTERECTOMY  2003   due to fibroids  . ADENOIDECTOMY     x3  . BIOPSY  06/29/2018   Procedure: BIOPSY;  Surgeon: Lavena Bullion, DO;  Location: [facility];  Service: Gastroenterology;;  . BREAST CYST ASPIRATION    . COLONOSCOPY  03/10/2007   results-normal  . COLONOSCOPY WITH PROPOFOL N/A 06/29/2018   Procedure: COLONOSCOPY WITH PROPOFOL;  Surgeon: Lavena Bullion, DO;  Location: [facility];  Service: Gastroenterology;  Laterality: N/A;  . HAMMER TOE SURGERY    . POLYPECTOMY  06/29/2018   Procedure: POLYPECTOMY;  Surgeon: Lavena Bullion, DO;  Location: [facility];  Service: Gastroenterology;;  . Lavonda Jumbo  '90's   for incontinence  . RHINOPLASTY  1984  . ROTATOR CUFF REPAIR Right 2013   Gayanne Prescott  . ROTATOR CUFF REPAIR Left 2012   Kilah Drahos  . SHOULDER SURGERY    . TONSILLECTOMY     x2  . TUBAL LIGATION    . [place]  . WRIST SURGERY     Social History   Occupational History  . Not on file  Tobacco Use  . Smoking status: Former Smoker    Packs/day: 2.00    Years: 15.00    Pack years: 30.00    Types:  Cigarettes    Quit date: 11/19/1989    Years since quitting: 30.5  . Smokeless tobacco: Never Used  Vaping Use  . Vaping Use: Never used  Substance and Sexual Activity  . Alcohol use: Yes    Comment: maybe a glass of wine on the weekend  . Drug use: No  . Sexual  activity: Not on file     Garald Balding, MD   Note - This record has been created using [employer].  Chart creation errors have been sought, but may not always  have been located. Such creation errors do not reflect on  the standard of medical care.

## 2020-05-16 ENCOUNTER — Ambulatory Visit: Payer: Medicare Other | Admitting: Psychology

## 2020-05-17 ENCOUNTER — Other Ambulatory Visit: Payer: Self-pay

## 2020-05-17 ENCOUNTER — Ambulatory Visit (INDEPENDENT_AMBULATORY_CARE_PROVIDER_SITE_OTHER): Payer: Medicare Other | Admitting: Family Medicine

## 2020-05-17 ENCOUNTER — Encounter: Payer: Self-pay | Admitting: Family Medicine

## 2020-05-17 VITALS — BP 132/80 | HR 60 | Temp 97.3°F | Ht 68.0 in | Wt 187.2 lb

## 2020-05-17 DIAGNOSIS — F32A Depression, unspecified: Secondary | ICD-10-CM

## 2020-05-17 DIAGNOSIS — F419 Anxiety disorder, unspecified: Secondary | ICD-10-CM

## 2020-05-17 DIAGNOSIS — E785 Hyperlipidemia, unspecified: Secondary | ICD-10-CM | POA: Insufficient documentation

## 2020-05-17 DIAGNOSIS — E782 Mixed hyperlipidemia: Secondary | ICD-10-CM

## 2020-05-17 NOTE — Progress Notes (Signed)
Patient: Chelsey Jackson MRN: [id number] DOB: November 08, 1950 PCP: Orma Flaming, MD     Subjective:  Chief Complaint  Patient presents with  . anxiety and depression  . Hyperlipidemia    HPI: The patient is a 70 y.o. female who presents today for Cholestrol check as well as anxiety and depression follow up.   Anxiety and depression Currently on wellbutrin 300mg  daily. She is also in counseling. She is having a hard time with covid as she will not go out and get together despite being vaccinated and always wearing a mask. She has been doing water aerobics three times a week. Also uses a lamp for her SAD. She thinks this does help her. Denies any si/hi.   Hyperlipidemia She is quite well controlled. She is not fasting today. +FH of heart attack in her father. No other risk factors. She does not smoke/have diabetes. She is not fasting today. Former smoker. Stopped in 1991.   She also says that her allergy doctor hears a murmur. I nor dr. Juleen China have appreciated this.   Review of Systems  Constitutional: Negative for chills, fatigue and fever.  HENT: Negative for dental problem, ear pain, hearing loss and trouble swallowing.   Eyes: Negative for visual disturbance.  Respiratory: Negative for cough, chest tightness and shortness of breath.   Cardiovascular: Negative for chest pain, palpitations and leg swelling.  Gastrointestinal: Negative for abdominal pain, blood in stool, diarrhea and nausea.  Endocrine: Negative for cold intolerance, polydipsia, polyphagia and polyuria.  Genitourinary: Negative for dysuria and hematuria.  Musculoskeletal: Negative for arthralgias.  Skin: Negative for rash.  Neurological: Negative for dizziness and headaches.  Psychiatric/Behavioral: Negative for dysphoric mood and sleep disturbance. The patient is not nervous/anxious.     Allergies Patient is allergic to penicillins.  Past Medical History Patient  has a past medical history of AC  (acromioclavicular) joint bone spurs, Allergy, Anxiety, Arthritis, Asthma, Complication of anesthesia, Depression, Fibrocystic breast disease, GERD (gastroesophageal reflux disease), OSA (obstructive sleep apnea) (07/31/2013), Peptic ulcer disease, and TMJ locking.  Surgical History Patient  has a past surgical history that includes Tonsillectomy; Adenoidectomy; Abdominal hysterectomy (2003); Tubal ligation; Rhinoplasty (1984); Turbinate reduction (1997); Pubovaginal sling ('90's); Colonoscopy (03/10/2007); Rotator cuff repair (Right, 2013); Rotator cuff repair (Left, 2012); Shoulder surgery; Wrist surgery; Hammer toe surgery; Breast cyst aspiration; Colonoscopy with propofol (N/A, 06/29/2018); polypectomy (06/29/2018); and biopsy (06/29/2018).  Family History Pateint's family history includes Breast cancer in her cousin and paternal aunt; Diabetes in her maternal grandmother and paternal grandfather; Fibroids in her mother; Heart disease in her father, maternal grandfather, and paternal grandmother; Lung cancer (age of onset: 34) in her mother; Osteoporosis in her mother; Other in her mother; Prostate cancer (age of onset: 44) in her father; Stomach cancer in her paternal grandfather; Testicular cancer (age of onset: 59) in her father.  Social History Patient  reports that she quit smoking about 30 years ago. Her smoking use included cigarettes. She has a 30.00 pack-year smoking history. She has never used smokeless tobacco. She reports current alcohol use. She reports that she does not use drugs.    Objective: Vitals:   05/17/20 0800  BP: 132/80  Pulse: 60  Temp: (!) 97.3 F (36.3 C)  TempSrc: Temporal  SpO2: 99%  Weight: 187 lb 3.2 oz (84.9 kg)  Height: 5\' 8"  (1.727 m)    Body mass index is 28.46 kg/m.  Physical Exam Vitals reviewed.  Constitutional:      Appearance: Normal appearance. She is  well-developed and well-nourished. She is obese.  HENT:     Head: Normocephalic and  atraumatic.     Right Ear: Tympanic membrane, ear canal and external ear normal.     Left Ear: Tympanic membrane, ear canal and external ear normal.     Mouth/Throat:     Mouth: Oropharynx is clear and moist.  Eyes:     Extraocular Movements: EOM normal.     Conjunctiva/sclera: Conjunctivae normal.     Pupils: Pupils are equal, round, and reactive to light.  Neck:     Thyroid: No thyromegaly.     Vascular: No carotid bruit.  Cardiovascular:     Rate and Rhythm: Normal rate and regular rhythm.     Pulses: Normal pulses and intact distal pulses.     Heart sounds: Normal heart sounds. No murmur heard.   Pulmonary:     Effort: Pulmonary effort is normal.     Breath sounds: Normal breath sounds.  Abdominal:     General: Bowel sounds are normal. There is no distension.     Palpations: Abdomen is soft.     Tenderness: There is no abdominal tenderness.  Musculoskeletal:     Cervical back: Normal range of motion and neck supple.  Lymphadenopathy:     Cervical: No cervical adenopathy.  Skin:    General: Skin is warm and dry.     Capillary Refill: Capillary refill takes less than 2 seconds.     Findings: No rash.  Neurological:     General: No focal deficit present.     Mental Status: She is alert and oriented to person, place, and time.     Cranial Nerves: No cranial nerve deficit.     Coordination: Coordination normal.     Deep Tendon Reflexes: Reflexes normal.  Psychiatric:        Mood and Affect: Mood and affect and mood normal.        Behavior: Behavior normal.        [facility] Office Visit from 05/17/2020 in [place]  PHQ-9 Total Score 13      Assessment/plan: 1. Anxiety and depression phq9 score is significantly worse than last year; however, covid is the culprit. She feels like medication and counseling are helping her. She doesn't want to change anything right now. Discussed if feels like getting worse to follow up sooner than 6 months.  Encouraged exercise. Continue counseling. No refills needed.   2. Mixed hyperlipidemia Will come back when fasting.  - Comprehensive metabolic panel; Future - CBC with Differential/Platelet; Future - Lipid panel; Future     This visit occurred during the SARS-CoV-2 public health emergency.  Safety protocols were in place, including screening questions prior to the visit, additional usage of staff PPE, and extensive cleaning of exam room while observing appropriate contact time as indicated for disinfecting solutions.     Return in about 6 months (around 11/14/2020) for anxiety and depression .    Orma Flaming, MD [place]   05/17/2020

## 2020-05-17 NOTE — Patient Instructions (Signed)
You look great. Keep an eye on your depression as it's a bit worse based off this screening test. I know covid is a huge part of it.   Have so much fun in [place]!!!!  Dr. Rogers Blocker

## 2020-05-21 ENCOUNTER — Other Ambulatory Visit (INDEPENDENT_AMBULATORY_CARE_PROVIDER_SITE_OTHER): Payer: Medicare Other

## 2020-05-21 DIAGNOSIS — E782 Mixed hyperlipidemia: Secondary | ICD-10-CM

## 2020-05-21 LAB — CBC WITH DIFFERENTIAL/PLATELET
Basophils Absolute: 0 10*3/uL (ref 0.0–0.1)
Basophils Relative: 0.5 % (ref 0.0–3.0)
Eosinophils Absolute: 0.1 10*3/uL (ref 0.0–0.7)
Eosinophils Relative: 2 % (ref 0.0–5.0)
HCT: 34.7 % — ABNORMAL LOW (ref 36.0–46.0)
Hemoglobin: 12 g/dL (ref 12.0–15.0)
Lymphocytes Relative: 28 % (ref 12.0–46.0)
Lymphs Abs: 1.4 10*3/uL (ref 0.7–4.0)
MCHC: 34.7 g/dL (ref 30.0–36.0)
MCV: 84 fl (ref 78.0–100.0)
Monocytes Absolute: 0.4 10*3/uL (ref 0.1–1.0)
Monocytes Relative: 7.9 % (ref 3.0–12.0)
Neutro Abs: 3 10*3/uL (ref 1.4–7.7)
Neutrophils Relative %: 61.6 % (ref 43.0–77.0)
Platelets: 232 10*3/uL (ref 150.0–400.0)
RBC: 4.13 Mil/uL (ref 3.87–5.11)
RDW: 14.2 % (ref 11.5–15.5)
WBC: 4.9 10*3/uL (ref 4.0–10.5)

## 2020-05-21 LAB — COMPREHENSIVE METABOLIC PANEL
ALT: 23 U/L (ref 0–35)
AST: 20 U/L (ref 0–37)
Albumin: 4.4 g/dL (ref 3.5–5.2)
Alkaline Phosphatase: 44 U/L (ref 39–117)
BUN: 20 mg/dL (ref 6–23)
CO2: 30 mEq/L (ref 19–32)
Calcium: 9.7 mg/dL (ref 8.4–10.5)
Chloride: 107 mEq/L (ref 96–112)
Creatinine, Ser: 0.8 mg/dL (ref 0.40–1.20)
GFR: 76.23 mL/min (ref >60.00)
Glucose, Bld: 96 mg/dL (ref 70–99)
Potassium: 4.2 mEq/L (ref 3.5–5.1)
Sodium: 142 mEq/L (ref 135–145)
Total Bilirubin: 0.5 mg/dL (ref 0.2–1.2)
Total Protein: 6.9 g/dL (ref 6.0–8.3)

## 2020-05-21 LAB — LIPID PANEL
Cholesterol: 171 mg/dL (ref 0–200)
HDL: 34.6 mg/dL — ABNORMAL LOW (ref >39.00)
LDL Cholesterol: 111 mg/dL — ABNORMAL HIGH (ref 0–99)
NonHDL: 136.55
Total CHOL/HDL Ratio: 5
Triglycerides: 128 mg/dL (ref 0.0–149.0)
VLDL: 25.6 mg/dL (ref 0.0–40.0)

## 2020-05-23 ENCOUNTER — Ambulatory Visit (INDEPENDENT_AMBULATORY_CARE_PROVIDER_SITE_OTHER): Payer: Medicare Other | Admitting: Physical Therapy

## 2020-05-23 ENCOUNTER — Other Ambulatory Visit: Payer: Self-pay

## 2020-05-23 ENCOUNTER — Encounter: Payer: Self-pay | Admitting: Physical Therapy

## 2020-05-23 DIAGNOSIS — M25512 Pain in left shoulder: Secondary | ICD-10-CM

## 2020-05-23 NOTE — Therapy (Signed)
[place], [place], [address] Phone: [phone]   Fax:  [phone]  Physical Therapy Evaluation  Patient Details  Name: Chelsey Jackson MRN: [id number] Date of Birth: 02-Jul-1950 Referring Provider (PT): Joni Fears   Encounter Date: 05/23/2020   PT End of Session - 05/23/20 2158    Visit Number 1    Number of Visits 12    Date for PT Re-Evaluation 07/04/20    Authorization Type Medicare    PT Start Time 1346    PT Stop Time 1425    PT Time Calculation (min) 39 min    Activity Tolerance Patient tolerated treatment well    Behavior During Therapy [facility] for tasks assessed/performed           Past Medical History:  Diagnosis Date  . 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 Allergy    weekly allergy shots  . Anxiety   . Arthritis   . Asthma   . Complication of anesthesia    difficult intubation  . Depression   . Fibrocystic breast disease   . GERD (gastroesophageal reflux disease)   . OSA (obstructive sleep apnea) 07/31/2013  . Peptic ulcer disease   . TMJ locking     Past Surgical History:  Procedure Laterality Date  . ABDOMINAL HYSTERECTOMY  2003   due to fibroids  . ADENOIDECTOMY     x3  . BIOPSY  06/29/2018   Procedure: BIOPSY;  Surgeon: Lavena Bullion, DO;  Location: [facility];  Service: Gastroenterology;;  . BREAST CYST ASPIRATION    . COLONOSCOPY  03/10/2007   results-normal  . COLONOSCOPY WITH PROPOFOL N/A 06/29/2018   Procedure: COLONOSCOPY WITH PROPOFOL;  Surgeon: Lavena Bullion, DO;  Location: [facility];  Service: Gastroenterology;  Laterality: N/A;  . HAMMER TOE SURGERY    . POLYPECTOMY  06/29/2018   Procedure: POLYPECTOMY;  Surgeon: Lavena Bullion, DO;  Location: [facility];  Service: Gastroenterology;;  . Lavonda Jumbo  '90's   for incontinence  . RHINOPLASTY  1984  . ROTATOR CUFF REPAIR Right  2013   [place]  . ROTATOR CUFF REPAIR Left 2012   [place]  . SHOULDER SURGERY    . TONSILLECTOMY     x2  . TUBAL LIGATION    . [place]  . WRIST SURGERY      There were no vitals filed for this visit.    Subjective Assessment - 05/23/20 1345    Subjective L shoulder pain in December. Has had previous RTC repair on L, about 8 years ago. She did get recent injection that did not help much. Feels she has pretty good mobility, but still has pain with certain motions, [facility]    Pertinent History previous RTC repair bilaterally    Patient Stated Goals decreased pain, improved strength    Currently in Pain? Yes    Pain Score 3     Pain Location Shoulder    Pain Orientation Left    Pain Descriptors / Indicators Aching    Pain Type Acute pain    Pain Onset More than a month ago    Pain Frequency Intermittent              [facility] PT Assessment - 05/23/20 0001      Assessment   Medical Diagnosis L shoulder pain    Referring  Provider (PT) Joni Fears    Prior Therapy for back      Precautions   Precaution Comments Previous RTC repair, current + findings for tearing in supraspinatus      Prior Function   Level of Independence Independent      Cognition   Overall Cognitive Status Within Functional Limits for tasks assessed      ROM / Strength   AROM / PROM / Strength AROM;Strength      AROM   Overall AROM Comments Slight limitation for full flexion, pain with full ER      Strength   Overall Strength Comments flex, abd: 4/5,  IR: 4+/5, ER: 4/5 fatigues easily      Palpation   Palpation comment Mild pain in suprapinatus region with deep palpation                      Objective measurements completed on examination: See above findings.       [facility] Adult PT Treatment/Exercise - 05/23/20 0001      Exercises   Exercises Shoulder      Shoulder Exercises: Supine   Flexion 10 reps;AAROM     Flexion Limitations cane      Shoulder Exercises: Sidelying   External Rotation 15 reps;Left      Shoulder Exercises: Standing   Row 20 reps    Theraband Level (Shoulder Row) Level 3 Nyoka Cowden)                  PT Education - 05/23/20 2158    Education Details PT POC, Exam findings, HEP    Person(s) Educated Patient    Methods Explanation;Demonstration;Tactile cues;Verbal cues;Handout    Comprehension Verbalized understanding;Returned demonstration;Verbal cues required;Tactile cues required;Need further instruction            PT Short Term Goals - 05/23/20 2200      PT SHORT TERM GOAL #1   Title Pt to be independent with initial HEP     Time 2    Period Weeks    Status New    Target Date 06/06/20             PT Long Term Goals - 05/23/20 2202      PT LONG TERM GOAL #1   Title Pt to be independent wtih final HEP    Time 6    Period Weeks    Status New    Target Date 07/04/20      PT LONG TERM GOAL #2   Title Pt to demo improved strength of L shoulder to at least 4+/5 for all motions to improve ability for IADLs, reaching, lifting, carrying    Time 6    Period Weeks    Status New    Target Date 07/04/20      PT LONG TERM GOAL #3   Title Pt to report decreased pain in L shoulder to 0-2/10 with elevation and activity    Time 6    Period Weeks    Status New    Target Date 07/04/20                  Plan - 05/23/20 2205    Clinical Impression Statement Pt presents with primary complaint of increased pain in L shoulder. Pt with good ROM, but does have pain with end range of ER. Pt with only mild strength deficits with strength testing, but states that arm fatigues very easily with activity and exercise. She  does have + findings on recent MRI for supraspinatus. Pt with decreased ability for full functional activities and IADLs with L shoulder due to pain and deficits. Pt to benefit from skilled PT to improve strengthening and pain relief for L  shoulder.    Personal Factors and Comorbidities Comorbidity 1    Comorbidities RTC tear(current) and previous repair on same side.    Examination-Activity Limitations Reach Overhead;Locomotion Level;Lift    Examination-Participation Restrictions Cleaning;Community Activity    Stability/Clinical Decision Making Stable/Uncomplicated    Clinical Decision Making Low    Rehab Potential Good    PT Frequency 2x / week    PT Duration 6 weeks    PT Treatment/Interventions ADLs/Self Care Home Management;Cryotherapy;Electrical Stimulation;Iontophoresis 4mg /ml Dexamethasone;Moist Heat;Ultrasound;DME Instruction;Therapeutic exercise;Therapeutic activities;Functional mobility training;Neuromuscular re-education;Patient/family education;Passive range of motion;Manual techniques;Dry needling;Taping;Vasopneumatic Device;Spinal Manipulations;Joint Manipulations    PT Home Exercise Plan [email or web address]    Consulted and Agree with Plan of Care Patient           Patient will benefit from skilled therapeutic intervention in order to improve the following deficits and impairments:  Pain,Decreased activity tolerance,Decreased strength,Decreased range of motion  Visit Diagnosis: Acute pain of left shoulder     Problem List Patient Active Problem List   Diagnosis Date Noted  . Hyperlipidemia, mild 05/17/2020  . Impingement syndrome of left shoulder 04/03/2020  . Urge incontinence 11/05/2018  . Diverticulosis of colon without hemorrhage   . Primary osteoarthritis of first carpometacarpal joint of left hand 06/22/2018  . Chronic pain of left thumb 06/22/2018  . History of retinal tear 10/20/2016  . Low back pain 09/28/2015  . Insomnia 08/18/2013  . OSA (obstructive sleep apnea) 07/31/2013  . Thyroid nodule, cold 07/15/2012  . Anxiety and depression 10/05/2007  . Allergic rhinitis due to pollen 10/05/2007  . Asthma 10/05/2007   Lyndee Hensen, PT, DPT 10:21 PM  05/23/20    [place], [place], [address] Phone: [phone]   Fax:  [phone]  Name: Chelsey Jackson MRN: [id number] Date of Birth: Feb 17, 1951

## 2020-05-23 NOTE — Patient Instructions (Signed)
Access Code: [email or web address] URL: [email or web address] Date: 05/23/2020 Prepared by: Lyndee Hensen  Exercises Standing Row with Anchored Resistance - 1 x daily - 2 sets - 10 reps Sidelying Shoulder ER with Towel and Dumbbell - 1 x daily - 1-2 sets - 10 reps Supine Shoulder Flexion with Dowel - 1 x daily - 1 sets - 10 reps

## 2020-05-29 ENCOUNTER — Ambulatory Visit (INDEPENDENT_AMBULATORY_CARE_PROVIDER_SITE_OTHER): Payer: Medicare Other | Admitting: Psychology

## 2020-05-29 DIAGNOSIS — F4323 Adjustment disorder with mixed anxiety and depressed mood: Secondary | ICD-10-CM | POA: Diagnosis not present

## 2020-05-30 ENCOUNTER — Ambulatory Visit: Payer: Medicare Other | Admitting: Psychology

## 2020-06-06 ENCOUNTER — Ambulatory Visit (INDEPENDENT_AMBULATORY_CARE_PROVIDER_SITE_OTHER): Payer: Medicare Other | Admitting: Physical Therapy

## 2020-06-06 ENCOUNTER — Encounter: Payer: Self-pay | Admitting: Physical Therapy

## 2020-06-06 ENCOUNTER — Encounter: Payer: Medicare Other | Admitting: Physical Therapy

## 2020-06-06 ENCOUNTER — Other Ambulatory Visit: Payer: Self-pay

## 2020-06-06 DIAGNOSIS — M25512 Pain in left shoulder: Secondary | ICD-10-CM | POA: Diagnosis not present

## 2020-06-06 NOTE — Patient Instructions (Signed)
ISOMETRIC SHOULDER FLEXION - BALL  Stand facing a wall with your arm by your side and elbow bent to approximately 90 degrees.  Make a fist and place a ball between your fist and the wall. Gently push your fist into the ball and hold. Relax and repeat.  Video # [id number]  Repeat 10 Times Hold 3 Seconds Complete 1 Set Perform 2 Times a Day  ISOMETRIC SHOULDER EXTERNAL ROTATION - BALL  Stand next to a wall with your arm by your side and elbow bent to approximately 90 degrees.  Place a ball between the back of your hand and the wall. Gently push your hand outward to the side against the ball and hold. Relax and repeat.  Video # [id number]  Repeat 10 Times Hold 3 Seconds Complete 2 Sets Perform 2 Times a Day

## 2020-06-06 NOTE — Therapy (Signed)
[place] [address] [place], [place], [address] Phone: [phone]   Fax:  [phone]  Physical Therapy Treatment  Patient Details  Name: Chelsey Jackson MRN: [id number] Date of Birth: [birth year] Referring Provider (PT): Joni Fears   Encounter Date: 06/06/2020   PT End of Session - 06/06/20 0856    Visit Number 2    Number of Visits 12    Date for PT Re-Evaluation 07/04/20    Authorization Type Medicare    PT Start Time 0803    PT Stop Time 0845    PT Time Calculation (min) 42 min    Activity Tolerance Patient tolerated treatment well    Behavior During Therapy [facility] for tasks assessed/performed           Past Medical History:  Diagnosis Date  . 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 Allergy    weekly allergy shots  . Anxiety   . Arthritis   . Asthma   . Complication of anesthesia    difficult intubation  . Depression   . Fibrocystic breast disease   . GERD (gastroesophageal reflux disease)   . OSA (obstructive sleep apnea) 07/31/2013  . Peptic ulcer disease   . TMJ locking     Past Surgical History:  Procedure Laterality Date  . ABDOMINAL HYSTERECTOMY  2003   due to fibroids  . ADENOIDECTOMY     x3  . BIOPSY  06/29/2018   Procedure: BIOPSY;  Surgeon: Lavena Bullion, DO;  [facility]: [facility];  Service: Gastroenterology;;  . BREAST CYST ASPIRATION    . COLONOSCOPY  03/10/2007   results-normal  . COLONOSCOPY WITH PROPOFOL N/A 06/29/2018   Procedure: COLONOSCOPY WITH PROPOFOL;  Surgeon: Lavena Bullion, DO;  Location: [facility];  Service: Gastroenterology;  Laterality: N/A;  . HAMMER TOE SURGERY    . POLYPECTOMY  06/29/2018   Procedure: POLYPECTOMY;  Surgeon: Lavena Bullion, DO;  Location[facility];  Service: Gastroenterology;;  . Lavonda Jumbo  '90's   for incontinence  . RHINOPLASTY  1984  . ROTATOR CUFF REPAIR Right  2013   [place]  . ROTATOR CUFF REPAIR Left 2012   [place]  . SHOULDER SURGERY    . TONSILLECTOMY     x2  . TUBAL LIGATION    . [place]  . WRIST SURGERY      There were no vitals filed for this visit.   Subjective Assessment - 06/06/20 0806    Subjective Pt states the shoulder has more discomfort than previously. She states she is more "acutely aware of it at this point." She states the band exercise and the side lifting exercise are the main ones that cause discomfort.    Pertinent History previous RTC repair bilaterally    Currently in Pain? Yes    Pain Score 5     Pain Location Shoulder    Pain Orientation Left    Pain Descriptors / Indicators Aching    Pain Type Acute pain    Pain Onset More than a month ago    Pain Frequency Intermittent                             [facility] Adult PT Treatment/Exercise - 06/06/20 0802      Exercises   Exercises Shoulder      Shoulder Exercises: Supine   Flexion 10  reps;AAROM    Flexion Limitations cane    Other Supine Exercises SA punch 2x10      Shoulder Exercises: Seated   Other Seated Exercises flexion and ER iso 3s x10    Other Seated Exercises post and ant shoulder rolls 15x      Shoulder Exercises: Sidelying   External Rotation 15 reps;Left      Shoulder Exercises: Standing   Row 20 reps    Theraband Level (Shoulder Row) Level 3 Nyoka Cowden)                  PT Education - 06/06/20 0830    Education Details HEP update, anatomy, exercise progression    Person(s) Educated Patient    Methods Explanation;Demonstration;Handout    Comprehension Verbalized understanding;Returned demonstration;Verbal cues required;Tactile cues required            PT Short Term Goals - 05/23/20 2200      PT SHORT TERM GOAL #1   Title Pt to be independent with initial HEP     Time 2    Period Weeks    Status New    Target Date 06/06/20             PT Long Term Goals - 05/23/20 2202       PT LONG TERM GOAL #1   Title Pt to be independent wtih final HEP    Time 6    Period Weeks    Status New    Target Date 07/04/20      PT LONG TERM GOAL #2   Title Pt to demo improved strength of L shoulder to at least 4+/5 for all motions to improve ability for IADLs, reaching, lifting, carrying    Time 6    Period Weeks    Status New    Target Date 07/04/20      PT LONG TERM GOAL #3   Title Pt to report decreased pain in L shoulder to 0-2/10 with elevation and activity    Time 6    Period Weeks    Status New    Target Date 07/04/20                 Plan - 06/06/20 0859    Clinical Impression Statement Pt presents with increased pain and discomfort at today's session with increased tenderness to palpation along the anterior deltoid and biceps musculotendinous junction. Pt required increased verbal and tactile cues for technique during HEP review, especially with cane flexion and S/L ER. Pt tolerated introduction of scapular stability and isometrics with minimal discomfort. Pt would benefit from skiled PT in order to maximize functional L UE strength and ROM for ADL and IADL.    Personal Factors and Comorbidities Comorbidity 1    Comorbidities RTC tear(current) and previous repair on same side.    Examination-Activity Limitations Reach Overhead;Locomotion Level;Lift    Examination-Participation Restrictions Cleaning;Community Activity    Stability/Clinical Decision Making Stable/Uncomplicated    Clinical Decision Making Low    Rehab Potential Good    PT Frequency 2x / week    PT Duration 6 weeks    PT Treatment/Interventions ADLs/Self Care Home Management;Cryotherapy;Electrical Stimulation;Iontophoresis 4mg /ml Dexamethasone;Moist Heat;Ultrasound;DME Instruction;Therapeutic exercise;Therapeutic activities;Functional mobility training;Neuromuscular re-education;Patient/family education;Passive range of motion;Manual techniques;Dry needling;Taping;Vasopneumatic Device;Spinal  Manipulations;Joint Manipulations    PT Home Exercise Plan [email or web address]           Patient will benefit from skilled therapeutic intervention in order to improve the following deficits and impairments:  Pain,Decreased activity  tolerance,Decreased strength,Decreased range of motion  Visit Diagnosis: Acute pain of left shoulder     Problem List Patient Active Problem List   Diagnosis Date Noted  . Hyperlipidemia, mild 05/17/2020  . Impingement syndrome of left shoulder 04/03/2020  . Urge incontinence 11/05/2018  . Diverticulosis of colon without hemorrhage   . Primary osteoarthritis of first carpometacarpal joint of left hand 06/22/2018  . Chronic pain of left thumb 06/22/2018  . History of retinal tear 10/20/2016  . Low back pain 09/28/2015  . Insomnia 08/18/2013  . OSA (obstructive sleep apnea) 07/31/2013  . Thyroid nodule, cold 07/15/2012  . Anxiety and depression 10/05/2007  . Allergic rhinitis due to pollen 10/05/2007  . Asthma 10/05/2007    Daleen Bo PT, DPT 06/06/20 9:06 AM   [facility] [address] [place], [place], [address] Phone: [phone]   Fax:  [phone]  Name: Chelsey Jackson MRN: [id number] Date of Birth: 1950/05/11

## 2020-06-12 ENCOUNTER — Ambulatory Visit (INDEPENDENT_AMBULATORY_CARE_PROVIDER_SITE_OTHER): Payer: Medicare Other | Admitting: Psychology

## 2020-06-12 DIAGNOSIS — F4323 Adjustment disorder with mixed anxiety and depressed mood: Secondary | ICD-10-CM

## 2020-06-13 ENCOUNTER — Ambulatory Visit: Payer: Medicare Other | Admitting: Psychology

## 2020-06-18 ENCOUNTER — Ambulatory Visit (INDEPENDENT_AMBULATORY_CARE_PROVIDER_SITE_OTHER): Payer: Medicare Other | Admitting: Physical Therapy

## 2020-06-18 ENCOUNTER — Encounter: Payer: Self-pay | Admitting: Physical Therapy

## 2020-06-18 ENCOUNTER — Other Ambulatory Visit: Payer: Self-pay

## 2020-06-18 DIAGNOSIS — M25512 Pain in left shoulder: Secondary | ICD-10-CM | POA: Diagnosis not present

## 2020-06-18 NOTE — Therapy (Signed)
[facility] [address] [place], [place], [address] Phone: [phone]   Fax:  [phone]  Physical Therapy Treatment  Patient Details  Name: Chelsey Jackson MRN: [id number] Date of Birth: July 16, 1950 Referring Provider (PT): Joni Fears   Encounter Date: 06/18/2020   PT End of Session - 06/18/20 2008    Visit Number 3    Number of Visits 12    Date for PT Re-Evaluation 07/04/20    Authorization Type Medicare    PT Start Time 1306    PT Stop Time 1345    PT Time Calculation (min) 39 min    Activity Tolerance Patient tolerated treatment well    Behavior During Therapy [facility] for tasks assessed/performed           Past Medical History:  Diagnosis Date  . 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 Allergy    weekly allergy shots  . Anxiety   . Arthritis   . Asthma   . Complication of anesthesia    difficult intubation  . Depression   . Fibrocystic breast disease   . GERD (gastroesophageal reflux disease)   . OSA (obstructive sleep apnea) 07/31/2013  . Peptic ulcer disease   . TMJ locking     Past Surgical History:  Procedure Laterality Date  . ABDOMINAL HYSTERECTOMY  2003   due to fibroids  . ADENOIDECTOMY     x3  . BIOPSY  06/29/2018   Procedure: BIOPSY;  Surgeon: Lavena Bullion, DO;  Location: [facility];  Service: Gastroenterology;;  . BREAST CYST ASPIRATION    . COLONOSCOPY  03/10/2007   results-normal  . COLONOSCOPY WITH PROPOFOL N/A 06/29/2018   Procedure: COLONOSCOPY WITH PROPOFOL;  Surgeon: Lavena Bullion, DO;  Location: [facility];  Service: Gastroenterology;  Laterality: N/A;  . HAMMER TOE SURGERY    . POLYPECTOMY  06/29/2018   Procedure: POLYPECTOMY;  Surgeon: Lavena Bullion, DO;  Location: [facility];  Service: Gastroenterology;;  . Lavonda Jumbo  '90's   for incontinence  . RHINOPLASTY  1984  . ROTATOR CUFF REPAIR Right  2013   [place]  . ROTATOR CUFF REPAIR Left 2012   [place]  . SHOULDER SURGERY    . TONSILLECTOMY     x2  . TUBAL LIGATION    . [place]  . WRIST SURGERY      There were no vitals filed for this visit.   Subjective Assessment - 06/18/20 2007    Subjective Pts shoulder sore today. She has been at daughters house for a week helping with new baby, cooking, cleaning. Pt states shoulder tires very easily.    Currently in Pain? Yes    Pain Score 6     Pain Location Shoulder    Pain Orientation Left    Pain Descriptors / Indicators Aching    Pain Type Acute pain    Pain Onset More than a month ago    Pain Frequency Intermittent                             [facility] Adult PT Treatment/Exercise - 06/18/20 0001      Exercises   Exercises Shoulder      Shoulder Exercises: Supine   Flexion 10 reps;AAROM    Flexion Limitations cane    Other Supine Exercises SA punch 2x10    Other Supine Exercises  90/90 small ROM, up/down and L/R , 2lb x 20 ;      Shoulder Exercises: Sidelying   External Rotation Left;20 reps    External Rotation Limitations x10 no weight, x 10 2lb      Shoulder Exercises: Standing   External Rotation 10 reps    Theraband Level (Shoulder External Rotation) Level 2 (Red)    Internal Rotation 10 reps    Theraband Level (Shoulder Internal Rotation) Level 2 (Red)    Row 20 reps    Theraband Level (Shoulder Row) Level 3 (Green)      Shoulder Exercises: ROM/Strengthening   Other ROM/Strengthening Exercises Wall slides 1 UE x 15;      Manual Therapy   Manual Therapy Passive ROM    Passive ROM PROm L shoulder , all motions,                    PT Short Term Goals - 05/23/20 2200      PT SHORT TERM GOAL #1   Title Pt to be independent with initial HEP     Time 2    Period Weeks    Status New    Target Date 06/06/20             PT Long Term Goals - 05/23/20 2202      PT LONG TERM GOAL #1   Title Pt to be  independent wtih final HEP    Time 6    Period Weeks    Status New    Target Date 07/04/20      PT LONG TERM GOAL #2   Title Pt to demo improved strength of L shoulder to at least 4+/5 for all motions to improve ability for IADLs, reaching, lifting, carrying    Time 6    Period Weeks    Status New    Target Date 07/04/20      PT LONG TERM GOAL #3   Title Pt to report decreased pain in L shoulder to 0-2/10 with elevation and activity    Time 6    Period Weeks    Status New    Target Date 07/04/20                 Plan - 06/18/20 2011    Clinical Impression Statement Pts shoulder quite sore with increased activity today. Possibly more sore today from increased activity at dauthers house this week. She was able to perform light strengthening with minimal pain, but states arm feels "tired" with increased reps. Discussed going light with HEP and strengthening to not increase pain. Pt to benefit from continued care.    Personal Factors and Comorbidities Comorbidity 1    Comorbidities RTC tear(current) and previous repair on same side.    Examination-Activity Limitations Reach Overhead;Locomotion Level;Lift    Examination-Participation Restrictions Cleaning;Community Activity    Stability/Clinical Decision Making Stable/Uncomplicated    Rehab Potential Good    PT Frequency 2x / week    PT Duration 6 weeks    PT Treatment/Interventions ADLs/Self Care Home Management;Cryotherapy;Electrical Stimulation;Iontophoresis 4mg /ml Dexamethasone;Moist Heat;Ultrasound;DME Instruction;Therapeutic exercise;Therapeutic activities;Functional mobility training;Neuromuscular re-education;Patient/family education;Passive range of motion;Manual techniques;Dry needling;Taping;Vasopneumatic Device;Spinal Manipulations;Joint Manipulations    PT Home Exercise Plan [email or web address]           Patient will benefit from skilled therapeutic intervention in order to improve the following deficits and impairments:   Pain,Decreased activity tolerance,Decreased strength,Decreased range of motion  Visit Diagnosis: Acute pain of left shoulder  Problem List Patient Active Problem List   Diagnosis Date Noted  . Hyperlipidemia, mild 05/17/2020  . Impingement syndrome of left shoulder 04/03/2020  . Urge incontinence 11/05/2018  . Diverticulosis of colon without hemorrhage   . Primary osteoarthritis of first carpometacarpal joint of left hand 06/22/2018  . Chronic pain of left thumb 06/22/2018  . History of retinal tear 10/20/2016  . Low back pain 09/28/2015  . Insomnia 08/18/2013  . OSA (obstructive sleep apnea) 07/31/2013  . Thyroid nodule, cold 07/15/2012  . Anxiety and depression 10/05/2007  . Allergic rhinitis due to pollen 10/05/2007  . Asthma 10/05/2007    Lyndee Hensen, PT, DPT 8:24 PM  06/18/20    [facility] [place], [place], [address] Phone: [phone]   Fax:  [phone]  Name: Chelsey Jackson MRN: [id number] Date of Birth: May 29, 1950

## 2020-06-18 NOTE — Patient Instructions (Signed)
Access Code: [email or web address] URL: [email or web address] Date: 06/18/2020 Prepared by: Lyndee Hensen  Exercises Standing Row with Anchored Resistance - 1 x daily - 2 sets - 10 reps Sidelying Shoulder ER with Towel and Dumbbell - 1 x daily - 1-2 sets - 10 reps Supine Shoulder Flexion with Dowel - 1 x daily - 1 sets - 10 reps Shoulder Internal Rotation with Resistance - 1 x daily - 1-2 sets - 10 reps Shoulder External Rotation with Anchored Resistance - 1 x daily - 1-2 sets - 10 reps Tricep Push Up on Wall - 1 x daily - 1-2 sets - 10 reps

## 2020-06-25 ENCOUNTER — Encounter: Payer: Medicare Other | Admitting: Physical Therapy

## 2020-06-26 ENCOUNTER — Ambulatory Visit (INDEPENDENT_AMBULATORY_CARE_PROVIDER_SITE_OTHER): Payer: Medicare Other | Admitting: Psychology

## 2020-06-26 DIAGNOSIS — F4323 Adjustment disorder with mixed anxiety and depressed mood: Secondary | ICD-10-CM | POA: Diagnosis not present

## 2020-06-27 ENCOUNTER — Ambulatory Visit: Payer: Medicare Other | Admitting: Psychology

## 2020-07-07 ENCOUNTER — Encounter: Payer: Self-pay | Admitting: Family Medicine

## 2020-07-09 ENCOUNTER — Ambulatory Visit (INDEPENDENT_AMBULATORY_CARE_PROVIDER_SITE_OTHER): Payer: Medicare Other | Admitting: Physical Therapy

## 2020-07-09 ENCOUNTER — Other Ambulatory Visit: Payer: Self-pay

## 2020-07-09 DIAGNOSIS — M25512 Pain in left shoulder: Secondary | ICD-10-CM | POA: Diagnosis not present

## 2020-07-10 ENCOUNTER — Ambulatory Visit (INDEPENDENT_AMBULATORY_CARE_PROVIDER_SITE_OTHER): Payer: Medicare Other | Admitting: Psychology

## 2020-07-10 DIAGNOSIS — F4323 Adjustment disorder with mixed anxiety and depressed mood: Secondary | ICD-10-CM | POA: Diagnosis not present

## 2020-07-11 ENCOUNTER — Ambulatory Visit: Payer: Medicare Other | Admitting: Psychology

## 2020-07-12 ENCOUNTER — Other Ambulatory Visit: Payer: Self-pay | Admitting: Family Medicine

## 2020-07-12 DIAGNOSIS — Z8249 Family history of ischemic heart disease and other diseases of the circulatory system: Secondary | ICD-10-CM

## 2020-07-14 ENCOUNTER — Other Ambulatory Visit: Payer: Self-pay | Admitting: Family Medicine

## 2020-07-15 ENCOUNTER — Encounter: Payer: Self-pay | Admitting: Physical Therapy

## 2020-07-15 NOTE — Therapy (Signed)
[facility] [address] [place], [place], [address] Phone: [phone]   Fax:  [phone]  Physical Therapy Treatment/Re-Cert   Patient Details  Name: Chelsey Jackson MRN: [id number] Date of Birth: 1950/10/15 Referring Provider (PT): Chelsey Jackson   Encounter Date: 07/09/2020   PT End of Session - 07/15/20 0801    Visit Number 4    Number of Visits 12    Date for PT Re-Evaluation 08/20/20    Authorization Type Medicare    PT Start Time 0805    PT Stop Time 0845    PT Time Calculation (min) 40 min    Activity Tolerance Patient tolerated treatment well    Behavior During Therapy [facility] for tasks assessed/performed           Past Medical History:  Diagnosis Date  . 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 Allergy    weekly allergy shots  . Anxiety   . Arthritis   . Asthma   . Complication of anesthesia    difficult intubation  . Depression   . Fibrocystic breast disease   . GERD (gastroesophageal reflux disease)   . OSA (obstructive sleep apnea) 07/31/2013  . Peptic ulcer disease   . TMJ locking     Past Surgical History:  Procedure Laterality Date  . ABDOMINAL HYSTERECTOMY  2003   due to fibroids  . ADENOIDECTOMY     x3  . BIOPSY  06/29/2018   Procedure: BIOPSY;  Surgeon: Chelsey Bullion, DO;  Location: [facility];  Service: Gastroenterology;;  . BREAST CYST ASPIRATION    . COLONOSCOPY  03/10/2007   results-normal  . COLONOSCOPY WITH PROPOFOL N/A 06/29/2018   Procedure: COLONOSCOPY WITH PROPOFOL;  Surgeon: Chelsey Bullion, DO;  Location: [facility];  Service: Gastroenterology;  Laterality: N/A;  . HAMMER TOE SURGERY    . POLYPECTOMY  06/29/2018   Procedure: POLYPECTOMY;  Surgeon: Chelsey Bullion, DO;  Location: [facility];  Service: Gastroenterology;;  . Chelsey Jackson  '90's   for incontinence  . RHINOPLASTY  1984  . ROTATOR CUFF  REPAIR Right 2013   [place]  . ROTATOR CUFF REPAIR Left 2012   [place]  . SHOULDER SURGERY    . TONSILLECTOMY     x2  . TUBAL LIGATION    . [place]  . WRIST SURGERY      There were no vitals filed for this visit.   Subjective Assessment - 07/15/20 0800    Subjective Pt last seen 2/28. She continues to have quite a bit of pain in shoulder. She has been back at daughters house helping with baby. She is also leaving to go on vacation at the end of this week.    Currently in Pain? Yes    Pain Score 6     Pain Location Shoulder    Pain Orientation Left    Pain Descriptors / Indicators Aching    Pain Type Acute pain    Pain Onset More than a month ago    Pain Frequency Intermittent              [facility] PT Assessment - 07/15/20 0001      Strength   Overall Strength Comments flex, abd: 4/5,  IR: 4+/5, ER: 4/5 fatigues easily  [facility] Adult PT Treatment/Exercise - 07/15/20 0001      Exercises   Exercises Shoulder      Shoulder Exercises: Supine   Flexion 10 reps;AAROM    Flexion Limitations cane    Other Supine Exercises SA punch 2x10      Shoulder Exercises: Sidelying   External Rotation 20 reps;Left;Right    External Rotation Weight (lbs) 1      Shoulder Exercises: Standing   External Rotation 10 reps    Theraband Level (Shoulder External Rotation) Level 2 (Red)    Internal Rotation 10 reps    Theraband Level (Shoulder Internal Rotation) Level 2 (Red)    Row 20 reps    Theraband Level (Shoulder Row) Level 3 (Green)      Shoulder Exercises: ROM/Strengthening   Other ROM/Strengthening Exercises Wall slides 1 UE x 15;      Manual Therapy   Manual Therapy Passive ROM                  PT Education - 07/15/20 0801    Education Details HEP reviewed    Person(s) Educated Patient    Methods Explanation;Demonstration;Verbal cues    Comprehension Verbalized understanding;Returned demonstration;Verbal  cues required;Tactile cues required;Need further instruction            PT Short Term Goals - 07/15/20 0809      PT SHORT TERM GOAL #1   Title Pt to be independent with initial HEP     Time 2    Period Weeks    Status Achieved    Target Date 06/06/20             PT Long Term Goals - 07/15/20 0809      PT LONG TERM GOAL #1   Title Pt to be independent wtih final HEP    Time 6    Period Weeks    Status Partially Met    Target Date 08/20/20      PT LONG TERM GOAL #2   Title Pt to demo improved strength of L shoulder to at least 4+/5 for all motions to improve ability for IADLs, reaching, lifting, carrying    Time 6    Period Weeks    Status Partially Met    Target Date 08/20/20      PT LONG TERM GOAL #3   Title Pt to report decreased pain in L shoulder to 0-2/10 with elevation and activity    Time 6    Period Weeks    Status On-going    Target Date 08/20/20                 Plan - 07/15/20 0810    Clinical Impression Statement Pt continues to have pain that is limiting functional mobility. She has mild ROM limitations for elevation due to pain, but PROM and joint mobility wnl. She has increased pain with elevation, and reaching, lifting, carrying, and IADLS. Pt with pain with and after activity, and also reports today mild increase in pain at rest. Discussed trying not to over do activity with L shoulder. Pt going on vacation at end of this week, will return when she gets back. Pt to benefit from continuation of skilled care to improve pain and improved ability for functional use of arm.    Personal Factors and Comorbidities Comorbidity 1    Comorbidities RTC tear(current) and previous repair on same side.    Examination-Activity Limitations Reach Overhead;Locomotion Level;Lift    Examination-Participation Restrictions Cleaning;Community  Activity    Stability/Clinical Decision Making Stable/Uncomplicated    Rehab Potential Good    PT Frequency 2x / week    PT  Duration 6 weeks    PT Treatment/Interventions ADLs/Self Care Home Management;Cryotherapy;Electrical Stimulation;Iontophoresis 79m/ml Dexamethasone;Moist Heat;Ultrasound;DME Instruction;Therapeutic exercise;Therapeutic activities;Functional mobility training;Neuromuscular re-education;Patient/family education;Passive range of motion;Manual techniques;Dry needling;Taping;Vasopneumatic Device;Spinal Manipulations;Joint Manipulations    PT Home Exercise Plan [email or web address]           Patient will benefit from skilled therapeutic intervention in order to improve the following deficits and impairments:  Pain,Decreased activity tolerance,Decreased strength,Decreased range of motion  Visit Diagnosis: Acute pain of left shoulder     Problem List Patient Active Problem List   Diagnosis Date Noted  . Hyperlipidemia, mild 05/17/2020  . Impingement syndrome of left shoulder 04/03/2020  . Urge incontinence 11/05/2018  . Diverticulosis of colon without hemorrhage   . Primary osteoarthritis of first carpometacarpal joint of left hand 06/22/2018  . Chronic pain of left thumb 06/22/2018  . History of retinal tear 10/20/2016  . Low back pain 09/28/2015  . Insomnia 08/18/2013  . OSA (obstructive sleep apnea) 07/31/2013  . Thyroid nodule, cold 07/15/2012  . Anxiety and depression 10/05/2007  . Allergic rhinitis due to pollen 10/05/2007  . Asthma 10/05/2007    LLyndee Hensen PT, DPT 8:34 AM  07/15/20    [facility] [address]Phone: [phone]  Fax:  [phone] Name: Chelsey LUEPKEMRN: [id number] of Birth: 803-Nov-1952

## 2020-07-24 ENCOUNTER — Other Ambulatory Visit: Payer: Self-pay

## 2020-07-24 ENCOUNTER — Ambulatory Visit: Payer: Medicare Other | Admitting: Psychology

## 2020-07-25 ENCOUNTER — Ambulatory Visit (INDEPENDENT_AMBULATORY_CARE_PROVIDER_SITE_OTHER): Payer: Medicare Other | Admitting: Physical Therapy

## 2020-07-25 ENCOUNTER — Encounter: Payer: Self-pay | Admitting: Physical Therapy

## 2020-07-25 ENCOUNTER — Ambulatory Visit: Payer: Medicare Other | Admitting: Psychology

## 2020-07-25 DIAGNOSIS — M25512 Pain in left shoulder: Secondary | ICD-10-CM | POA: Diagnosis not present

## 2020-07-25 NOTE — Therapy (Signed)
[facility] [address] [place], [place], [address] Phone: [phone]   Fax:  [phone]  Physical Therapy Treatment  Patient Details  Name: Chelsey Jackson MRN: [id number] Date of Birth: 1950/11/01 Referring Provider (PT): Joni Fears   Encounter Date: 07/25/2020   PT End of Session - 07/25/20 1210    Visit Number 5    Number of Visits 12    Date for PT Re-Evaluation 08/20/20    Authorization Type Medicare    PT Start Time 1110    PT Stop Time 1145    PT Time Calculation (min) 35 min    Activity Tolerance Patient tolerated treatment well    Behavior During Therapy [facility] for tasks assessed/performed           Past Medical History:  Diagnosis Date  . 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 Allergy    weekly allergy shots  . Anxiety   . Arthritis   . Asthma   . Complication of anesthesia    difficult intubation  . Depression   . Fibrocystic breast disease   . GERD (gastroesophageal reflux disease)   . OSA (obstructive sleep apnea) 07/31/2013  . Peptic ulcer disease   . TMJ locking     Past Surgical History:  Procedure Laterality Date  . ABDOMINAL HYSTERECTOMY  2003   due to fibroids  . ADENOIDECTOMY     x3  . BIOPSY  06/29/2018   Procedure: BIOPSY;  Surgeon: Lavena Bullion, DO;  Location: [facility];  Service: Gastroenterology;;  . BREAST CYST ASPIRATION    . COLONOSCOPY  03/10/2007   results-normal  . COLONOSCOPY WITH PROPOFOL N/A 06/29/2018   Procedure: COLONOSCOPY WITH PROPOFOL;  Surgeon: Lavena Bullion, DO;  Location: [facility];  Service: Gastroenterology;  Laterality: N/A;  . HAMMER TOE SURGERY    . POLYPECTOMY  06/29/2018   Procedure: POLYPECTOMY;  Surgeon: Lavena Bullion, DO;  Location: [facility];  Service: Gastroenterology;;  . Lavonda Jumbo  '90's   for incontinence  . RHINOPLASTY  1984  . ROTATOR CUFF REPAIR Right  2013   [place]  . ROTATOR CUFF REPAIR Left 2012   [place]  . SHOULDER SURGERY    . TONSILLECTOMY     x2  . TUBAL LIGATION    . [place]  . WRIST SURGERY      There were no vitals filed for this visit.   Subjective Assessment - 07/25/20 1208    Subjective Pt has been away for 2 weeks, had to sleepin on L side in hotel, due to c-pap. Feels shoulder is not much improved today. Notes pain with increased use or sleeping on that side.    Currently in Pain? Yes    Pain Score 2     Pain Location Shoulder    Pain Orientation Left    Pain Descriptors / Indicators Aching    Pain Type Acute pain    Pain Onset More than a month ago    Pain Frequency Intermittent              [facility] PT Assessment - 07/25/20 0001      AROM   Overall AROM Comments WNL      Strength   Overall Strength Comments 4+5 all motions  [facility] Adult PT Treatment/Exercise - 07/25/20 0001      Exercises   Exercises Shoulder      Shoulder Exercises: Supine   Flexion --    Flexion Limitations --    Other Supine Exercises --    Other Supine Exercises 90 deg: ER 3lb x 20;      Shoulder Exercises: Sidelying   External Rotation Left;15 reps    External Rotation Weight (lbs) 2    External Rotation Limitations pain after 10 reps      Shoulder Exercises: Standing   External Rotation 20 reps    Theraband Level (Shoulder External Rotation) Level 3 (Green)    Internal Rotation 20 reps    Theraband Level (Shoulder Internal Rotation) Level 2 (Red);Level 3 (Green)    Row 20 reps    Theraband Level (Shoulder Row) Level 3 (Green)    Other Standing Exercises Scaption AROM x 20: Abd 2 lb to 90 deg x 10;  Military press 2 lb bil, x 10;      Shoulder Exercises: ROM/Strengthening   Other ROM/Strengthening Exercises --      Manual Therapy   Manual Therapy Passive ROM    Passive ROM PROm L shoulder , all motions,                  PT Education -  07/25/20 1210    Education Details HEP reviewed    Person(s) Educated Patient    Methods Explanation;Demonstration;Verbal cues;Handout    Comprehension Verbalized understanding;Returned demonstration;Verbal cues required;Need further instruction            PT Short Term Goals - 07/25/20 1211      PT SHORT TERM GOAL #1   Title Pt to be independent with initial HEP     Time 2    Period Weeks    Status Achieved    Target Date 06/06/20             PT Long Term Goals - 07/15/20 0809      PT LONG TERM GOAL #1   Title Pt to be independent wtih final HEP    Time 6    Period Weeks    Status Partially Met    Target Date 08/20/20      PT LONG TERM GOAL #2   Title Pt to demo improved strength of L shoulder to at least 4+/5 for all motions to improve ability for IADLs, reaching, lifting, carrying    Time 6    Period Weeks    Status Partially Met    Target Date 08/20/20      PT LONG TERM GOAL #3   Title Pt to report decreased pain in L shoulder to 0-2/10 with elevation and activity    Time 6    Period Weeks    Status On-going    Target Date 08/20/20                 Plan - 07/25/20 1212    Clinical Impression Statement Pt did very well today. Demonstrates full, pain free ROM, and improved ability for strengthening today without pain. She does continue to have mild pain 1-2/10 at times after increased activity. Pt sore from travel, but overall is showing good improvments. Pt to benefit from continued care for strengthening 1x/wk.    Personal Factors and Comorbidities Comorbidity 1    Comorbidities RTC tear(current) and previous repair on same side.    Examination-Activity Limitations Reach Overhead;Locomotion Level;Lift    Examination-Participation  Restrictions Cleaning;Community Activity    Stability/Clinical Decision Making Stable/Uncomplicated    Rehab Potential Good    PT Frequency 2x / week    PT Duration 6 weeks    PT Treatment/Interventions ADLs/Self Care Home  Management;Cryotherapy;Electrical Stimulation;Iontophoresis 53m/ml Dexamethasone;Moist Heat;Ultrasound;DME Instruction;Therapeutic exercise;Therapeutic activities;Functional mobility training;Neuromuscular re-education;Patient/family education;Passive range of motion;Manual techniques;Dry needling;Taping;Vasopneumatic Device;Spinal Manipulations;Joint Manipulations    PT Home Exercise Plan [email or web address]           Patient will benefit from skilled therapeutic intervention in order to improve the following deficits and impairments:  Pain,Decreased activity tolerance,Decreased strength,Decreased range of motion  Visit Diagnosis: Acute pain of left shoulder     Problem List Patient Active Problem List   Diagnosis Date Noted  . Hyperlipidemia, mild 05/17/2020  . Impingement syndrome of left shoulder 04/03/2020  . Urge incontinence 11/05/2018  . Diverticulosis of colon without hemorrhage   . Primary osteoarthritis of first carpometacarpal joint of left hand 06/22/2018  . Chronic pain of left thumb 06/22/2018  . History of retinal tear 10/20/2016  . Low back pain 09/28/2015  . Insomnia 08/18/2013  . OSA (obstructive sleep apnea) 07/31/2013  . Thyroid nodule, cold 07/15/2012  . Anxiety and depression 10/05/2007  . Allergic rhinitis due to pollen 10/05/2007  . Asthma 10/05/2007    LLyndee Hensen PT, DPT 12:14 PM  07/25/20     [facility] [address]Phone: [phone]  Fax:  [phone] Name: PELDA DUNKERSONMRN: [id number] of Birth: 821-Sep-1952

## 2020-07-25 NOTE — Patient Instructions (Signed)
Access Code: [email or web address] URL: [email or web address] Date: 07/25/2020 Prepared by: Lyndee Hensen  Exercises Standing Row with Anchored Resistance - 1 x daily - 2 sets - 10 reps Sidelying Shoulder ER with Towel and Dumbbell - 1 x daily - 1-2 sets - 10 reps Supine Shoulder Flexion with Dowel - 1 x daily - 1 sets - 10 reps Shoulder Internal Rotation with Resistance - 1 x daily - 1-2 sets - 10 reps Shoulder External Rotation with Anchored Resistance - 1 x daily - 1-2 sets - 10 reps Tricep Push Up on Wall - 1 x daily - 1-2 sets - 10 reps Standing Shoulder Scaption - 1 x daily - 1 sets - 10 reps

## 2020-07-26 ENCOUNTER — Encounter: Payer: Self-pay | Admitting: Family Medicine

## 2020-07-31 ENCOUNTER — Other Ambulatory Visit: Payer: Self-pay | Admitting: Family Medicine

## 2020-08-01 ENCOUNTER — Encounter: Payer: Medicare Other | Admitting: Physical Therapy

## 2020-08-01 NOTE — Telephone Encounter (Signed)
Pt requesting Temazepam 30 mg capsule LOV: 05/17/2020 No future visits scheduled  Last refill: 04/29/2020  Please Advise.

## 2020-08-07 ENCOUNTER — Encounter: Payer: Medicare Other | Admitting: Physical Therapy

## 2020-08-09 ENCOUNTER — Ambulatory Visit: Payer: Medicare Other

## 2020-08-13 ENCOUNTER — Ambulatory Visit (INDEPENDENT_AMBULATORY_CARE_PROVIDER_SITE_OTHER): Payer: Medicare Other | Admitting: Psychology

## 2020-08-13 DIAGNOSIS — F331 Major depressive disorder, recurrent, moderate: Secondary | ICD-10-CM

## 2020-08-15 ENCOUNTER — Ambulatory Visit: Payer: Medicare Other | Admitting: Cardiovascular Disease

## 2020-08-18 ENCOUNTER — Encounter: Payer: Self-pay | Admitting: Family Medicine

## 2020-08-21 ENCOUNTER — Encounter: Payer: Self-pay | Admitting: Physical Therapy

## 2020-08-21 ENCOUNTER — Other Ambulatory Visit: Payer: Self-pay

## 2020-08-21 ENCOUNTER — Ambulatory Visit (INDEPENDENT_AMBULATORY_CARE_PROVIDER_SITE_OTHER): Payer: Medicare Other | Admitting: Physical Therapy

## 2020-08-21 DIAGNOSIS — M25512 Pain in left shoulder: Secondary | ICD-10-CM

## 2020-08-21 NOTE — Therapy (Addendum)
[facility] [address] [place], [place], [address] Phone: [phone]   Fax:  [phone]  Physical Therapy Treatment  Patient Details  Name: Chelsey Jackson MRN: [id number] Date of Birth: 15-Oct-1950 Referring Provider (PT): Joni Fears   Encounter Date: 08/21/2020   PT End of Session - 08/21/20 1416     Visit Number 6    Number of Visits 12    Date for PT Re-Evaluation 08/21/20    Authorization Type Medicare    PT Start Time 1335    PT Stop Time 1410    PT Time Calculation (min) 35 min    Activity Tolerance Patient tolerated treatment well    Behavior During Therapy [facility] for tasks assessed/performed             Past Medical History:  Diagnosis Date   AC (acromioclavicular) joint bone spurs    left shoulder with chronic pain, w/limitation ROM   Allergy    weekly allergy shots   Anxiety    Arthritis    Asthma    Complication of anesthesia    difficult intubation   Depression    Fibrocystic breast disease    GERD (gastroesophageal reflux disease)    OSA (obstructive sleep apnea) 07/31/2013   Peptic ulcer disease    TMJ locking     Past Surgical History:  Procedure Laterality Date   ABDOMINAL HYSTERECTOMY  2003   due to fibroids   ADENOIDECTOMY     x3   BIOPSY  06/29/2018   Procedure: BIOPSY;  Surgeon: Lavena Bullion, DO;  Location: [facility];  Service: Gastroenterology;;   BREAST CYST ASPIRATION     COLONOSCOPY  03/10/2007   results-normal   COLONOSCOPY WITH PROPOFOL N/A 06/29/2018   Procedure: COLONOSCOPY WITH PROPOFOL;  Surgeon: Lavena Bullion, DO;  Location: [facility];  Service: Gastroenterology;  Laterality: N/A;   HAMMER TOE SURGERY     POLYPECTOMY  06/29/2018   Procedure: POLYPECTOMY;  Surgeon: Lavena Bullion, DO;  Location: [facility];  Service: Gastroenterology;;   PUBOVAGINAL SLING  '90's   for incontinence   [place] Right 2013   [place]    ROTATOR CUFF REPAIR Left 2012   [place]   SHOULDER SURGERY     TONSILLECTOMY     x2   [place]   WRIST SURGERY      There were no vitals filed for this visit.   Subjective Assessment - 08/21/20 1414     Subjective Pt last seen 4/6. Has been out of town and then was sick. Pt states daily pain, 2/10 in her shoulder Does increase depending on activity. She has not had significant pain relief in last couple months. She is scheduled for MD f/u 5/16 to discuss options for pain relief and/or surgery.    Currently in Pain? Yes    Pain Location Shoulder    Pain Orientation Left    Pain Descriptors / Indicators Aching    Pain Type Acute pain    Pain Onset More than a month ago    Pain Frequency Intermittent                               [facility] Adult PT Treatment/Exercise - 08/21/20 0001       Exercises   Exercises Shoulder      Shoulder Exercises:  Supine   Other Supine Exercises --      Shoulder Exercises: Sidelying   External Rotation Left;15 reps    External Rotation Weight (lbs) 2    External Rotation Limitations --      Shoulder Exercises: Standing   External Rotation 20 reps    Theraband Level (Shoulder External Rotation) Level 3 (Green)    Internal Rotation 20 reps    Theraband Level (Shoulder Internal Rotation) Level 3 (Green)    Row 20 reps    Theraband Level (Shoulder Row) Level 3 (Green)    Other Standing Exercises Scaption AROM x 20: Abd  to 90 deg x 10;    Other Standing Exercises Wall push ups x 20      Shoulder Exercises: Pulleys   Flexion 2 minutes      Manual Therapy   Manual Therapy Passive ROM    Passive ROM PROm L shoulder , all motions,                    PT Education - 08/21/20 1415     Education Details Reviewed Final HEP    Person(s) Educated Patient    Methods Explanation;Demonstration;Tactile cues;Verbal cues;Handout    Comprehension Verbalized understanding;Returned  demonstration;Verbal cues required;Tactile cues required              PT Short Term Goals - 07/25/20 1211       PT SHORT TERM GOAL #1   Title Pt to be independent with initial HEP     Time 2    Period Weeks    Status Achieved    Target Date 06/06/20               PT Long Term Goals - 08/21/20 1418       PT LONG TERM GOAL #1   Title Pt to be independent wtih final HEP    Time 6    Period Weeks    Status Achieved      PT LONG TERM GOAL #2   Title Pt to demo improved strength of L shoulder to at least 4+/5 for all motions to improve ability for IADLs, reaching, lifting, carrying    Time 6    Period Weeks    Status Achieved      PT LONG TERM GOAL #3   Title Pt to report decreased pain in L shoulder to 0-2/10 with elevation and activity    Time 6    Period Weeks    Status Partially Met                   Plan - 08/21/20 1419     Clinical Impression Statement Pt doing very well functionally, with full ROM, and near full strength. Her L shoulder does fatigue with longer duration activities, and she does have increased pain at times with increased activitiy. She has not had signfiicant change in pain levels. She does have f/u with MD in 2 weeks. Plan to hold at this time, until MD visit. Final HEP reviewed today. Discussed importance of continued strengthening for RTC tear.    Personal Factors and Comorbidities Comorbidity 1    Comorbidities RTC tear(current) and previous repair on same side.    Examination-Activity Limitations Reach Overhead;Locomotion Level;Lift    Examination-Participation Restrictions Cleaning;Community Activity    Stability/Clinical Decision Making Stable/Uncomplicated    Rehab Potential Good    PT Frequency 2x / week    PT Duration 6 weeks    PT  Treatment/Interventions ADLs/Self Care Home Management;Cryotherapy;Electrical Stimulation;Iontophoresis 44m/ml Dexamethasone;Moist Heat;Ultrasound;DME Instruction;Therapeutic  exercise;Therapeutic activities;Functional mobility training;Neuromuscular re-education;Patient/family education;Passive range of motion;Manual techniques;Dry needling;Taping;Vasopneumatic Device;Spinal Manipulations;Joint Manipulations    PT Home Exercise Plan [id number]             Patient will benefit from skilled therapeutic intervention in order to improve the following deficits and impairments:  Pain,Decreased activity tolerance,Decreased strength,Decreased range of motion  Visit Diagnosis: Acute pain of left shoulder     Problem List Patient Active Problem List   Diagnosis Date Noted   Hyperlipidemia, mild 05/17/2020   Impingement syndrome of left shoulder 04/03/2020   Urge incontinence 11/05/2018   Diverticulosis of colon without hemorrhage    Primary osteoarthritis of first carpometacarpal joint of left hand 06/22/2018   Chronic pain of left thumb 06/22/2018   History of retinal tear 10/20/2016   Low back pain 09/28/2015   Insomnia 08/18/2013   OSA (obstructive sleep apnea) 07/31/2013   Thyroid nodule, cold 07/15/2012   Anxiety and depression 10/05/2007   Allergic rhinitis due to pollen 10/05/2007   Asthma 10/05/2007    Chelsey Hensen5/06/2020, 2:25 PM  [id number] [place] [address]Phone: [phone]  Fax:  [phone] Name: Chelsey BREMNERMRN: [id number] of Birth: [birth year] PHYSICAL THERAPY DISCHARGE SUMMARY  Visits from Start of Care: 6  Plan: Patient agrees to discharge.  Patient goals were  met. Patient is being discharged due to f/u with ortho    Chelsey Jackson PT, DPT 12:14 PM  05/29/21

## 2020-08-22 ENCOUNTER — Encounter: Payer: Medicare Other | Admitting: Physical Therapy

## 2020-08-22 ENCOUNTER — Encounter: Payer: Self-pay | Admitting: Physician Assistant

## 2020-08-27 ENCOUNTER — Ambulatory Visit: Payer: Medicare Other | Admitting: Psychology

## 2020-08-28 ENCOUNTER — Ambulatory Visit (INDEPENDENT_AMBULATORY_CARE_PROVIDER_SITE_OTHER): Payer: Medicare Other | Admitting: Psychology

## 2020-08-28 DIAGNOSIS — F331 Major depressive disorder, recurrent, moderate: Secondary | ICD-10-CM | POA: Diagnosis not present

## 2020-09-03 ENCOUNTER — Ambulatory Visit (INDEPENDENT_AMBULATORY_CARE_PROVIDER_SITE_OTHER): Payer: Medicare Other | Admitting: Orthopedic Surgery

## 2020-09-03 ENCOUNTER — Ambulatory Visit: Payer: Medicare Other | Admitting: Cardiology

## 2020-09-03 ENCOUNTER — Ambulatory Visit: Payer: Self-pay

## 2020-09-03 DIAGNOSIS — M25512 Pain in left shoulder: Secondary | ICD-10-CM

## 2020-09-03 DIAGNOSIS — M75102 Unspecified rotator cuff tear or rupture of left shoulder, not specified as traumatic: Secondary | ICD-10-CM

## 2020-09-03 DIAGNOSIS — G8929 Other chronic pain: Secondary | ICD-10-CM | POA: Diagnosis not present

## 2020-09-03 DIAGNOSIS — M19012 Primary osteoarthritis, left shoulder: Secondary | ICD-10-CM | POA: Diagnosis not present

## 2020-09-03 NOTE — Progress Notes (Signed)
Subjective: Patient is here for ultrasound-guided intra-articular left glenohumeral injection.    Objective:  Pain at extremes of ROM.  Procedure: Ultrasound guided injection is preferred based studies that show increased duration, increased effect, greater accuracy, decreased procedural pain, increased response rate, and decreased cost with ultrasound guided versus blind injection.   Verbal informed consent obtained.  Time-out conducted.  Noted no overlying erythema, induration, or other signs of local infection. Ultrasound-guided left glenohumeral injection: After sterile prep with Betadine, injected 4 cc 0.25% bupivocaine without epinephrine and 6 mg betamethasone using a 22-gauge spinal needle, passing the needle from posterior approach into the glenohumeral joint.  Injectate seen filling joint capsule.

## 2020-09-05 ENCOUNTER — Encounter: Payer: Self-pay | Admitting: Orthopedic Surgery

## 2020-09-05 NOTE — Progress Notes (Signed)
Office Visit Note   Patient: Chelsey Jackson           Date of Birth: May 02, 1950           MRN: [id number] Visit Date: 09/03/2020 Requested by: Orma Flaming, [address] [place],  [place] [address] PCP: Orma Flaming, MD  Subjective: No chief complaint on file.   HPI: Chelsey Jackson is a 70 y.o. female who presents to the office complaining of left shoulder pain.  Patient complains of primarily lateral shoulder pain with some anterior and posterior component since December 2021.  Denies any history of injury.  No radiation of pain, neck pain, numbness/tingling, shoulder blade pain.  She was seen by Biagio Borg, PA-C and had a subacromial injection without any relief at all.  She tried physical therapy which has provided some benefit but not any significant benefit that she is satisfied with.  She wakes with pain at night and cannot sleep on her left side.  She uses ice to help with the pain which is most helpful thing.  She takes occasional Aleve which does not really give her much relief.  Denies any weakness and states just pain is her complaint.  She does have history of prior rotator cuff repair by Dr. Durward Fortes about 5 years ago that she did well from.  She had an MRI of the shoulder and that revealed 0.7 cm supraspinatus tear in the critical zone without any atrophy of the muscle belly as well as moderate glenohumeral osteoarthritis with edema in the posterior glenoid..                ROS: All systems reviewed are negative as they relate to the chief complaint within the history of present illness.  Patient denies fevers or chills.  Assessment & Plan: Visit Diagnoses:  1. Primary osteoarthritis, left shoulder   2. Chronic left shoulder pain   3. Tear of left supraspinatus tendon     Plan: Patient is a 70 year old female who presents complaint of left shoulder pain.  She had no injury and localizes most of her pain to the lateral aspect of the shoulder.  No radiation  of pain.  No weakness and her only complaint is pain.  She has had a subacromial injection with no relief even for several minutes.  Does have history of rotator cuff repair but she has excellent strength of the rotator cuff despite a supraspinatus tear demonstrated on recent MRI scan.  She also had moderate glenohumeral arthritis on the MRI with edema in the posterior glenoid.  With no pain elicited with supraspinatus strength testing today and increased pain with passive external rotation, concerned that some majority of her pain may be stemming from her glenohumeral arthritis and bony edema rather than the rotator cuff tear.  Plan to have Dr. Junius Roads administer left shoulder glenohumeral joint injection.  Patient will hold off on physical therapy for 2 weeks before returning.  Follow-up in 6 weeks for clinical recheck to determine how much relief she got from this injection.  Patient agreed with this plan.  Follow-Up Instructions: No follow-ups on file.   Orders:  Orders Placed This Encounter  Procedures  . US Guided Needle Placement   No orders of the defined types were placed in this encounter.     Procedures: No procedures performed   Clinical Data: No additional findings.  Objective: Vital Signs: LMP  (LMP Unknown)   Physical Exam:  Constitutional: Patient appears well-developed HEENT:  Head: Normocephalic Eyes:EOM are normal Neck: Normal range of motion Cardiovascular: Normal rate Pulmonary/chest: Effort normal Neurologic: Patient is alert Skin: Skin is warm Psychiatric: Patient has normal mood and affect  Ortho Exam: Ortho exam demonstrates left shoulder with 70 degrees external rotation, 100 degrees abduction, 150 degrees forward flexion.  Is compared with the right shoulder with 80 degrees external rotation, 115 degrees abduction, 160 degrees forward flexion.  She has excellent rotator cuff strength of the supraspinatus, infraspinatus, subscapularis of the left shoulder  and there is no increased pain with stressing of all 3 major rotator cuff tendons.  Her pain is worst with passive motion of the arm, particularly in external rotation.  No tenderness over the [facility] joint.  No pain with cervical spine range of motion.  5/5 motor strength of bilateral grip strength, finger abduction, pronation/supination, bicep, tricep, deltoid.  She is able to actively lift her shoulder with active range of motion equivalent to passive range of motion.  Negative Hornblower sign.  Negative drop arm test.  Tenderness over the bicipital groove.  Specialty Comments:  No specialty comments available.  Imaging: No results found.   PMFS History: Patient Active Problem List   Diagnosis Date Noted  . Hyperlipidemia, mild 05/17/2020  . Impingement syndrome of left shoulder 04/03/2020  . Urge incontinence 11/05/2018  . Diverticulosis of colon without hemorrhage   . Primary osteoarthritis of first carpometacarpal joint of left hand 06/22/2018  . Chronic pain of left thumb 06/22/2018  . History of retinal tear 10/20/2016  . Low back pain 09/28/2015  . Insomnia 08/18/2013  . OSA (obstructive sleep apnea) 07/31/2013  . Thyroid nodule, cold 07/15/2012  . Anxiety and depression 10/05/2007  . Allergic rhinitis due to pollen 10/05/2007  . Asthma 10/05/2007   Past Medical History:  Diagnosis Date  . 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 Allergy    weekly allergy shots  . Anxiety   . Arthritis   . Asthma   . Complication of anesthesia    difficult intubation  . Depression   . Fibrocystic breast disease   . GERD (gastroesophageal reflux disease)   . OSA (obstructive sleep apnea) 07/31/2013  . Peptic ulcer disease   . TMJ locking     Family History  Problem Relation Age of Onset  . Osteoporosis Mother   . Fibroids Mother   . Other Mother        aortic valve replacement 1994, abdominal aortic aneurysm  . Lung cancer  Mother 40  . Heart disease Father   . Testicular cancer Father 45  . Prostate cancer Father 4  . Diabetes Maternal Grandmother   . Heart disease Maternal Grandfather   . Heart disease Paternal Grandmother   . Diabetes Paternal Grandfather   . Stomach cancer Paternal Grandfather   . Breast cancer Paternal Aunt   . Breast cancer Cousin   . Colon cancer Neg Hx   . Throat cancer Neg Hx   . Pancreatic cancer Neg Hx     Past Surgical History:  Procedure Laterality Date  . ABDOMINAL HYSTERECTOMY  2003   due to fibroids  . ADENOIDECTOMY     x3  . BIOPSY  06/29/2018   Procedure: BIOPSY;  Surgeon: Lavena Bullion, DO;  [facility]: [facility];  Service: Gastroenterology;;  . BREAST CYST ASPIRATION    . COLONOSCOPY  03/10/2007   results-normal  . COLONOSCOPY WITH PROPOFOL N/A 06/29/2018   Procedure:  COLONOSCOPY WITH PROPOFOL;  Surgeon: Lavena Bullion, DO;  Location: [facility];  Service: Gastroenterology;  Laterality: N/A;  . HAMMER TOE SURGERY    . POLYPECTOMY  06/29/2018   Procedure: POLYPECTOMY;  Surgeon: Lavena Bullion, DO;  Location: [facility];  Service: Gastroenterology;;  . Lavonda Jumbo  '90's   for incontinence  . RHINOPLASTY  1984  . ROTATOR CUFF REPAIR Right 2013   [place]  . ROTATOR CUFF REPAIR Left 2012   [place]  . SHOULDER SURGERY    . TONSILLECTOMY     x2  . TUBAL LIGATION    . [place]  . WRIST SURGERY     Social History   Occupational History  . Not on file  Tobacco Use  . Smoking status: Former Smoker    Packs/day: 2.00    Years: 15.00    Pack years: 30.00    Types: Cigarettes    Quit date: 11/19/1989    Years since quitting: 30.8  . Smokeless tobacco: Never Used  Vaping Use  . Vaping Use: Never used  Substance and Sexual Activity  . Alcohol use: Yes    Comment: maybe a glass of wine on the weekend  . Drug use: No  . Sexual activity: Not on file

## 2020-09-06 ENCOUNTER — Encounter: Payer: Self-pay | Admitting: Family Medicine

## 2020-09-06 ENCOUNTER — Ambulatory Visit: Payer: Medicare Other

## 2020-09-07 ENCOUNTER — Other Ambulatory Visit: Payer: Self-pay

## 2020-09-07 DIAGNOSIS — H9190 Unspecified hearing loss, unspecified ear: Secondary | ICD-10-CM

## 2020-09-12 DIAGNOSIS — J4520 Mild intermittent asthma, uncomplicated: Secondary | ICD-10-CM | POA: Insufficient documentation

## 2020-09-12 DIAGNOSIS — J309 Allergic rhinitis, unspecified: Secondary | ICD-10-CM | POA: Insufficient documentation

## 2020-09-12 DIAGNOSIS — J3081 Allergic rhinitis due to animal (cat) (dog) hair and dander: Secondary | ICD-10-CM | POA: Insufficient documentation

## 2020-09-12 DIAGNOSIS — J300 Vasomotor rhinitis: Secondary | ICD-10-CM | POA: Insufficient documentation

## 2020-09-13 ENCOUNTER — Ambulatory Visit: Payer: Medicare Other | Admitting: Psychology

## 2020-09-13 ENCOUNTER — Other Ambulatory Visit: Payer: Self-pay

## 2020-09-13 ENCOUNTER — Encounter: Payer: Self-pay | Admitting: Primary Care

## 2020-09-13 ENCOUNTER — Ambulatory Visit (INDEPENDENT_AMBULATORY_CARE_PROVIDER_SITE_OTHER): Payer: Medicare Other | Admitting: Primary Care

## 2020-09-13 VITALS — BP 126/80 | HR 64 | Temp 97.4°F | Ht 68.0 in | Wt 190.0 lb

## 2020-09-13 DIAGNOSIS — Z9989 Dependence on other enabling machines and devices: Secondary | ICD-10-CM | POA: Diagnosis not present

## 2020-09-13 DIAGNOSIS — G4733 Obstructive sleep apnea (adult) (pediatric): Secondary | ICD-10-CM

## 2020-09-13 NOTE — Progress Notes (Signed)
@Patient  ID: Chelsey Jackson, female    DOB: 01-Mar-1951, 70 y.o.   MRN: [id number]  Chief Complaint  Patient presents with  . Follow-up    Patient would like to see if she is a candidate for the oral device, she is currently working with Dr. Toy Cookey. Patient needs a new sleep study done per Dr. Toy Cookey it has to be less than 26 years old.    Referring provider: Orma Flaming, MD  HPI: 70 year old female, former smoker. PMH significant for OSA, mild intermittent asthma, allergic rhinitis, insomnia. Patient of Dr. Halford Chessman, last seen in office on 09/23/19. Auto CPAP 5-10cm h20. PSG 10/11/13 >>AHI 38.7, SaO2 low 74%.   09/13/2020 Patient presents today for annual follow-up OSA. It was recommended that her CPAP pressure be changed to 5-7cm h20 back in December 2021, however, her current download shows that her CPAP pressure is 7-10cm h20. She is interested in oral appliance. States that in order for her to get a good seal on her CPAP mask she has to over tighten head gear which  causes deformity to her face. She has tried at least 6 different masks  She is currently using using N30 small nasal mask. Needs repeat sleep study as her previous study is > 74 years old.  Airview download 08/13/20-09/11/20 30/30 days used; 100% > 4 hours Pressure 7-10 (9.8cm h20- 95%) Airleaks 11.6L/min (95%) AHI 1.1   Allergies  Allergen Reactions  . Penicillins Anaphylaxis    Stopped breathing REACTION: stop breathing    Immunization History  Administered Date(s) Administered  . Fluad Quad(high Dose 65+) 01/11/2020  . Influenza Whole 01/20/2008, 02/02/2009, 01/10/2010  . Influenza, High Dose Seasonal PF 02/28/2016, 02/18/2017, 02/24/2018, 12/24/2018, 06/20/2019, 06/19/2020  . Influenza,inj,Quad PF,6+ Mos 01/06/2015, 01/08/2016, 01/19/2017  . Influenza,inj,quad, With Preservative 01/06/2015  . Influenza-Unspecified 01/20/2012, 01/06/2013, 01/16/2014, 01/01/2018, 12/24/2018  . PFIZER(Purple Top)SARS-COV-2  Vaccination 05/12/2019, 06/02/2019, 12/13/2019, 06/19/2020, 08/08/2020  . Pneumococcal Conjugate-13 11/05/2018  . Pneumococcal Polysaccharide-23 07/29/2013, 02/28/2016, 02/18/2017, 02/24/2018, 06/20/2019, 01/11/2020, 06/19/2020  . Td 03/26/2009  . Tdap 07/25/2016  . Zoster Recombinat (Shingrix) 12/04/2016, 03/16/2017  . Zoster, Live 08/11/2008    Past Medical History:  Diagnosis Date  . 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 Allergy    weekly allergy shots  . Anxiety   . Arthritis   . Asthma   . Complication of anesthesia    difficult intubation  . Depression   . Fibrocystic breast disease   . GERD (gastroesophageal reflux disease)   . OSA (obstructive sleep apnea) 07/31/2013  . Peptic ulcer disease   . TMJ locking     Tobacco History: Social History   Tobacco Use  Smoking Status Former Smoker  . Packs/day: 2.00  . Years: 15.00  . Pack years: 30.00  . Types: Cigarettes  . Quit date: 11/19/1989  . Years since quitting: 30.8  Smokeless Tobacco Never Used   Counseling given: Not Answered   Outpatient Medications Prior to Visit  Medication Sig Dispense Refill  . buPROPion (WELLBUTRIN XL) 300 MG 24 hr tablet TAKE ONE TABLET BY MOUTH DAILY 90 tablet 0  . Calcium Carbonate-Vitamin D 600-400 MG-UNIT tablet Take 1 tablet by mouth daily.    . diclofenac sodium (VOLTAREN) 1 % GEL Apply 2-4 g topically 4 (four) times daily. (Patient taking differently: Apply 2-4 g topically 4 (four) times daily as needed.) 6 Tube 2  . EPINEPHrine 0.3 mg/0.3 mL IJ SOAJ  injection Inject into the muscle once.    . fluticasone (FLONASE) 50 MCG/ACT nasal spray Place 2 sprays into both nostrils daily.     . Iron-Vitamin C 65-125 MG TABS Take 1 tablet by mouth daily.    Marland Kitchen levocetirizine (XYZAL) 5 MG tablet Take 5 mg by mouth every evening.    . Multiple Vitamin (MULTIVITAMIN) tablet Take 1 tablet by mouth daily.    Marland Kitchen PROAIR RESPICLICK 681 (90  Base) MCG/ACT AEPB as needed.     . temazepam (RESTORIL) 30 MG capsule TAKE ONE CAPSULE BY MOUTH EVERY NIGHT AT BEDTIME AS NEEDED FOR SLEEP 90 capsule 0   Facility-Administered Medications Prior to Visit  Medication Dose Route Frequency Provider Last Rate Last Admin  . 0.9 %  sodium chloride infusion  500 mL Intravenous Once [place], MD        Review of Systems  Review of Systems  Constitutional: Negative.   Respiratory: Negative.   Psychiatric/Behavioral: Negative.    Physical Exam  BP 126/80 (BP Location: Left Arm, Patient Position: Sitting, Cuff Size: Normal)   Pulse 64   Temp (!) 97.4 F (36.3 C) (Temporal)   Ht 5\' 8"  (1.727 m)   Wt 190 lb (86.2 kg)   LMP  (LMP Unknown)   SpO2 95%   BMI 28.89 kg/m  Physical Exam Constitutional:      Appearance: Normal appearance.  HENT:     Head: Normocephalic and atraumatic.     Mouth/Throat:     Mouth: Mucous membranes are moist.     Pharynx: Oropharynx is clear.  Cardiovascular:     Rate and Rhythm: Normal rate and regular rhythm.  Pulmonary:     Effort: Pulmonary effort is normal.     Breath sounds: Normal breath sounds.  Skin:    General: Skin is warm and dry.  Neurological:     General: No focal deficit present.     Mental Status: She is alert and oriented to person, place, and time. Mental status is at baseline.  Psychiatric:        Mood and Affect: Mood normal.        Behavior: Behavior normal.        Thought Content: Thought content normal.        Judgment: Judgment normal.      Jackson Results:  CBC    Component Value Date/Time   WBC 4.9 05/21/2020 0838   RBC 4.13 05/21/2020 0838   HGB 12.0 05/21/2020 0838   HCT 34.7 (L) 05/21/2020 0838   PLT 232.0 05/21/2020 0838   MCV 84.0 05/21/2020 0838   MCHC 34.7 05/21/2020 0838   RDW 14.2 05/21/2020 0838   LYMPHSABS 1.4 05/21/2020 0838   MONOABS 0.4 05/21/2020 0838   EOSABS 0.1 05/21/2020 0838   BASOSABS 0.0 05/21/2020 0838    BMET    Component Value  Date/Time   NA 142 05/21/2020 0838   K 4.2 05/21/2020 0838   CL 107 05/21/2020 0838   CO2 30 05/21/2020 0838   GLUCOSE 96 05/21/2020 0838   BUN 20 05/21/2020 0838   CREATININE 0.80 05/21/2020 0838   CALCIUM 9.7 05/21/2020 0838   GFRNONAA 87.37 10/31/2009 0747    BNP No results found for: BNP  ProBNP No results found for: PROBNP  Imaging: US Guided Needle Placement  Result Date: 09/03/2020 Ultrasound guided injection is preferred based studies that show increased duration, increased effect, greater accuracy, decreased procedural pain, increased response rate, and decreased cost with ultrasound guided  versus blind injection.   Verbal informed consent obtained.  Time-out conducted.  Noted no overlying erythema, induration, or other signs of local infection. Ultrasound-guided left glenohumeral injection: After sterile prep with Betadine, injected 4 cc 0.25% bupivocaine without epinephrine and 6 mg betamethasone using a 22-gauge spinal needle, passing the needle from posterior approach into the glenohumeral joint.  Injectate seen filling joint capsule.      Assessment & Plan:   OSA (obstructive sleep apnea) - PSG 10/11/13 >>AHI 38.7, SaO2 low 74%. Patient is 100% compliant with CPAP. Pressure 7-10cm h20; residual AHI 1.1. Poorly tolerating CPAP d/t mask fit, she has tried 6 different masks. She is interested in oral appliance, she has already seen Dr. Augustina Mood, DDS and they need repeat sleep study as her previous one is > 2 years ago. Order HST re: OSA (we will fax note and results to Dr. Corky Sing office). FU in 1 year with Dr. Halford Chessman or sooner if needed      Martyn Ehrich, NP 09/13/2020

## 2020-09-13 NOTE — Patient Instructions (Signed)
CPAP intolerant, referring to Dr. Toy Cookey for oral appliance We will forward our note and sleep study results once complete   Orders: Home sleep study re: OSA (ordered)  Follow-up 1 year with Dr. Halford Chessman    Sleep Apnea Sleep apnea affects breathing during sleep. It causes breathing to stop for a short time or to become shallow. It can also increase the risk of:  Heart attack.  Stroke.  Being very overweight (obese).  Diabetes.  Heart failure.  Irregular heartbeat. The goal of treatment is to help you breathe normally again. What are the causes? There are three kinds of sleep apnea:  Obstructive sleep apnea. This is caused by a blocked or collapsed airway.  Central sleep apnea. This happens when the brain does not send the right signals to the muscles that control breathing.  Mixed sleep apnea. This is a combination of obstructive and central sleep apnea. The most common cause of this condition is a collapsed or blocked airway. This can happen if:  Your throat muscles are too relaxed.  Your tongue and tonsils are too large.  You are overweight.  Your airway is too small.   What increases the risk?  Being overweight.  Smoking.  Having a small airway.  Being older.  Being female.  Drinking alcohol.  Taking medicines to calm yourself (sedatives or tranquilizers).  Having family members with the condition. What are the signs or symptoms?  Trouble staying asleep.  Being sleepy or tired during the day.  Getting angry a lot.  Loud snoring.  Headaches in the morning.  Not being able to focus your mind (concentrate).  Forgetting things.  Less interest in sex.  Mood swings.  Personality changes.  Feelings of sadness (depression).  Waking up a lot during the night to pee (urinate).  Dry mouth.  Sore throat. How is this diagnosed?  Your medical history.  A physical exam.  A test that is done when you are sleeping (sleep study). The test is  most often done in a sleep lab but may also be done at home. How is this treated?  Sleeping on your side.  Using a medicine to get rid of mucus in your nose (decongestant).  Avoiding the use of alcohol, medicines to help you relax, or certain pain medicines (narcotics).  Losing weight, if needed.  Changing your diet.  Not smoking.  Using a machine to open your airway while you sleep, such as: ? An oral appliance. This is a mouthpiece that shifts your lower jaw forward. ? A CPAP device. This device blows air through a mask when you breathe out (exhale). ? An EPAP device. This has valves that you put in each nostril. ? A BPAP device. This device blows air through a mask when you breathe in (inhale) and breathe out.  Having surgery if other treatments do not work. It is important to get treatment for sleep apnea. Without treatment, it can lead to:  High blood pressure.  Coronary artery disease.  In men, not being able to have an erection (impotence).  Reduced thinking ability.   Follow these instructions at home: Lifestyle  Make changes that your doctor recommends.  Eat a healthy diet.  Lose weight if needed.  Avoid alcohol, medicines to help you relax, and some pain medicines.  Do not use any products that contain nicotine or tobacco, such as cigarettes, e-cigarettes, and chewing tobacco. If you need help quitting, ask your doctor. General instructions  Take over-the-counter and prescription medicines only  as told by your doctor.  If you were given a machine to use while you sleep, use it only as told by your doctor.  If you are having surgery, make sure to tell your doctor you have sleep apnea. You may need to bring your device with you.  Keep all follow-up visits as told by your doctor. This is important. Contact a doctor if:  The machine that you were given to use during sleep bothers you or does not seem to be working.  You do not get better.  You get  worse. Get help right away if:  Your chest hurts.  You have trouble breathing in enough air.  You have an uncomfortable feeling in your back, arms, or stomach.  You have trouble talking.  One side of your body feels weak.  A part of your face is hanging down. These symptoms may be an emergency. Do not wait to see if the symptoms will go away. Get medical help right away. Call your local emergency services (911 in the [place] Do not drive yourself to the hospital. Summary  This condition affects breathing during sleep.  The most common cause is a collapsed or blocked airway.  The goal of treatment is to help you breathe normally while you sleep. This information is not intended to replace advice given to you by your health care provider. Make sure you discuss any questions you have with your health care provider. Document Revised: 01/22/2018 Document Reviewed: 12/01/2017 Elsevier Patient Education  [facility] [place].

## 2020-09-13 NOTE — Progress Notes (Signed)
Reviewed and agree with assessment/plan.   [name], MD [facility] Pulmonary/Critical Care 09/13/2020, 12:24 PM Pager:  [phone]

## 2020-09-13 NOTE — Assessment & Plan Note (Addendum)
-   PSG 10/11/13 >>AHI 38.7, SaO2 low 74%. Patient is 100% compliant with CPAP. Pressure 7-10cm h20; residual AHI 1.1. Poorly tolerating CPAP d/t mask fit, she has tried 6 different masks. She is interested in oral appliance, she has already seen Dr. Augustina Mood, DDS and they need repeat sleep study as her previous one is > 2 years ago. Order HST re: OSA (we will fax note and results to Dr. Corky Sing office). FU in 1 year with Dr. Halford Chessman or sooner if needed

## 2020-09-20 ENCOUNTER — Encounter: Payer: Self-pay | Admitting: Cardiology

## 2020-09-20 ENCOUNTER — Ambulatory Visit (INDEPENDENT_AMBULATORY_CARE_PROVIDER_SITE_OTHER): Payer: Medicare Other | Admitting: Cardiology

## 2020-09-20 ENCOUNTER — Other Ambulatory Visit: Payer: Self-pay

## 2020-09-20 VITALS — BP 118/76 | HR 71 | Ht 68.0 in | Wt 189.0 lb

## 2020-09-20 DIAGNOSIS — E785 Hyperlipidemia, unspecified: Secondary | ICD-10-CM | POA: Diagnosis not present

## 2020-09-20 DIAGNOSIS — Z7189 Other specified counseling: Secondary | ICD-10-CM

## 2020-09-20 DIAGNOSIS — Z79899 Other long term (current) drug therapy: Secondary | ICD-10-CM

## 2020-09-20 DIAGNOSIS — Z8249 Family history of ischemic heart disease and other diseases of the circulatory system: Secondary | ICD-10-CM

## 2020-09-20 DIAGNOSIS — Z713 Dietary counseling and surveillance: Secondary | ICD-10-CM | POA: Diagnosis not present

## 2020-09-20 DIAGNOSIS — Z7182 Exercise counseling: Secondary | ICD-10-CM

## 2020-09-20 MED ORDER — PRAVASTATIN SODIUM 20 MG PO TABS
20.0000 mg | ORAL_TABLET | Freq: Every evening | ORAL | 3 refills | Status: DC
Start: 2020-09-20 — End: 2020-12-26

## 2020-09-20 NOTE — Progress Notes (Signed)
Cardiology Office Note:    Date:  09/20/2020   ID:  JAMEISHA STOFKO, DOB Jun 13, 1950, MRN [id number]  PCP:  [name], [place]  Cardiologist:  Buford Dresser, MD  Referring MD: Orma Flaming, MD   Chief Complaint  Patient presents with  . New Patient (Initial Visit)    History of Present Illness:    KAITLYNE FRIEDHOFF is a 70 y.o. female with a hx of OSA on CPAP who is seen as a new consult at the request of Orma Flaming, MD for the evaluation and management of family history of cardiovascular disease.  Note from 05/17/20 with Dr. Rogers Blocker reviewed. Noted history of MI in her father. She is a former smoker, quit in 1991. Referred for further risk stratification.  Cardiovascular risk factors: Prior clinical ASCVD: none Comorbid conditions, including hypertension, hyperlipidemia, diabetes, chronic kidney disease: borderline lipids Metabolic syndrome/Obesity: highest adult weight 200 lbs Chronic inflammatory conditions: none Tobacco use history: former, quit 12/11/1989 (on her birthday) Family history: father had MI, first age late 60s/early 52s. He had survived metastatic testicular cancer in his 64s. Had two more stents put in age late 70s/early 64s, diagnosed with afib at that time. Deceased age 28. Mother had a fever when she was young, told she had MVP but had AVR in her early-mid 96s, passed away from lung cancer age 11. 2 siblings, sister and brother. Brother is 21 years younger, had nuclear stress test that was normal, started on a statin. Prior cardiac testing and/or incidental findings on other testing (ie coronary calcium): none Exercise level: deep water aerobics 3x/week for the last several months, walks with her husband on occasion (heat limits her) Current diet: "everything". Chicken, fish, fruits/veg. Fried food rarely (special occasions).  Denies chest pain, shortness of breath at rest or with normal exertion. No PND, orthopnea, LE edema or unexpected weight gain.  No syncope or palpitations.  Past Medical History:  Diagnosis Date  . 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 Allergy    weekly allergy shots  . Anxiety   . Arthritis   . Asthma   . Complication of anesthesia    difficult intubation  . Depression   . Fibrocystic breast disease   . GERD (gastroesophageal reflux disease)   . OSA (obstructive sleep apnea) 07/31/2013  . Peptic ulcer disease   . TMJ locking     Past Surgical History:  Procedure Laterality Date  . ABDOMINAL HYSTERECTOMY  2003   due to fibroids  . ADENOIDECTOMY     x3  . BIOPSY  06/29/2018   Procedure: BIOPSY;  Surgeon: Lavena Bullion, DO;  Location: [facility];  Service: Gastroenterology;;  . BREAST CYST ASPIRATION    . COLONOSCOPY  03/10/2007   results-normal  . COLONOSCOPY WITH PROPOFOL N/A 06/29/2018   Procedure: COLONOSCOPY WITH PROPOFOL;  Surgeon: Lavena Bullion, DO;  Location: [facility];  Service: Gastroenterology;  Laterality: N/A;  . HAMMER TOE SURGERY    . POLYPECTOMY  06/29/2018   Procedure: POLYPECTOMY;  Surgeon: Lavena Bullion, DO;  Location: [facility];  Service: Gastroenterology;;  . Lavonda Jumbo  '90's   for incontinence  . RHINOPLASTY  1984  . ROTATOR CUFF REPAIR Right 2013   [place]  . ROTATOR CUFF REPAIR Left 2012   [place]  . SHOULDER SURGERY    . TONSILLECTOMY     x2  . TUBAL LIGATION    . [place]  .  WRIST SURGERY      Current Medications: Current Outpatient Medications on File Prior to Visit  Medication Sig  . buPROPion (WELLBUTRIN XL) 300 MG 24 hr tablet TAKE ONE TABLET BY MOUTH DAILY  . Calcium Carbonate-Vitamin D 600-400 MG-UNIT tablet Take 1 tablet by mouth daily.  . diclofenac sodium (VOLTAREN) 1 % GEL Apply 2-4 g topically 4 (four) times daily. (Patient taking differently: Apply 2-4 g topically 4 (four) times daily as needed.)  . EPINEPHrine 0.3 mg/0.3 mL IJ SOAJ injection  Inject into the muscle once.  . fluticasone (FLONASE) 50 MCG/ACT nasal spray Place 2 sprays into both nostrils daily.   . Iron-Vitamin C 65-125 MG TABS Take 1 tablet by mouth daily.  Marland Kitchen levocetirizine (XYZAL) 5 MG tablet Take 5 mg by mouth every evening.  . Multiple Vitamin (MULTIVITAMIN) tablet Take 1 tablet by mouth daily.  Marland Kitchen PROAIR RESPICLICK 161 (90 Base) MCG/ACT AEPB as needed.   . temazepam (RESTORIL) 30 MG capsule TAKE ONE CAPSULE BY MOUTH EVERY NIGHT AT BEDTIME AS NEEDED FOR SLEEP   Current Facility-Administered Medications on File Prior to Visit  Medication  . 0.9 %  sodium chloride infusion     Allergies:   Penicillins   Social History   Tobacco Use  . Smoking status: Former Smoker    Packs/day: 2.00    Years: 15.00    Pack years: 30.00    Types: Cigarettes    Quit date: 11/19/1989    Years since quitting: 30.8  . Smokeless tobacco: Never Used  Vaping Use  . Vaping Use: Never used  Substance Use Topics  . Alcohol use: Yes    Comment: maybe a glass of wine on the weekend  . Drug use: No    Family History: family history includes Breast cancer in her cousin and paternal aunt; Diabetes in her maternal grandmother and paternal grandfather; Fibroids in her mother; Heart disease in her father, maternal grandfather, and paternal grandmother; Lung cancer (age of onset: 35) in her mother; Osteoporosis in her mother; Other in her mother; Prostate cancer (age of onset: 27) in her father; Stomach cancer in her paternal grandfather; Testicular cancer (age of onset: 74) in her father. There is no history of Colon cancer, Throat cancer, or Pancreatic cancer.  ROS:   Please see the history of present illness.  Additional pertinent ROS: Constitutional: Negative for chills, fever, night sweats, unintentional weight loss  HENT: Negative for ear pain and complete hearing loss.  Does have a hearing test pending as her pattern has changed (hard to hear dialogue) Eyes: Negative for loss of  vision and eye pain.  Respiratory: Negative for cough, sputum, wheezing.   Cardiovascular: See HPI. Gastrointestinal: Negative for abdominal pain, melena, and hematochezia.  Genitourinary: Negative for dysuria and hematuria.  Musculoskeletal: Negative for falls and myalgias.  Skin: Negative for itching and rash.  Neurological: Negative for focal weakness, focal sensory changes and loss of consciousness.  Endo/Heme/Allergies: Does not bruise/bleed easily.     EKGs/Labs/Other Studies Reviewed:    The following studies were reviewed today: No prior cardiac studies.  EKG:  EKG is personally reviewed.  The ekg ordered today demonstrates NSR at 71 bpm.  Recent Labs: 05/21/2020: ALT 23; BUN 20; Creatinine, Ser 0.80; Hemoglobin 12.0; Platelets 232.0; Potassium 4.2; Sodium 142  Recent Lipid Panel    Component Value Date/Time   CHOL 171 05/21/2020 0838   TRIG 128.0 05/21/2020 0838   HDL 34.60 (L) 05/21/2020 0838   CHOLHDL 5 05/21/2020  4982   VLDL 25.6 05/21/2020 0838   LDLCALC 111 (H) 05/21/2020 0838    Physical Exam:    VS:  BP 118/76 (BP Location: Left Arm, Patient Position: Sitting, Cuff Size: Normal)   Pulse 71   Ht 5\' 8"  (1.727 m)   Wt 189 lb (85.7 kg)   LMP  (LMP Unknown)   BMI 28.74 kg/m     Wt Readings from Last 3 Encounters:  09/20/20 189 lb (85.7 kg)  09/13/20 190 lb (86.2 kg)  05/17/20 187 lb 3.2 oz (84.9 kg)    GEN: Well nourished, well developed in no acute distress HEENT: Normal, moist mucous membranes NECK: No JVD CARDIAC: regular rhythm, normal S1 and S2, no rubs or gallops. No murmur. VASCULAR: Radial and DP pulses 2+ bilaterally. No carotid bruits RESPIRATORY:  Clear to auscultation without rales, wheezing or rhonchi  ABDOMEN: Soft, non-tender, non-distended MUSCULOSKELETAL:  Ambulates independently SKIN: Warm and dry, no edema NEUROLOGIC:  Alert and oriented x 3. No focal neuro deficits noted. PSYCHIATRIC:  Normal affect    ASSESSMENT:    1. Family  history of heart disease   2. Cardiac risk counseling   3. Counseling on health promotion and disease prevention   4. Nutritional counseling   5. Exercise counseling   6. Dyslipidemia (high LDL; low HDL)   7. Medication management    PLAN:    Family history of heart disease  Mild dyslipidemia Review of CV risk --discussed pathology of cholesterol, how plaques form, that MI/CVA result commonly from acute plaque rupture and not gradual stenosis. Discussed mechanism of statin to both decrease plaque accumulation and stabilize plaque that is already present. Discussed that calcium is a marker for plaque, with decades of validated data regarding average amounts of calcium for age/gender/ethnicity, as well as value of calcium score for risk stratification --we discussed the data on statins, both in terms of their long term benefit as well as the risk of side effects. Reviewed common misconceptions about statins. Reviewed how we monitor treatment. After shared decision making, patient is agreeable to trialing statin.  After shared decision making, she would like to start a statin. -we discussed the data on statins, both in terms of their long term benefit as well as the risk of side effects. Reviewed common misconceptions about statins. Reviewed how we monitor treatment. -starting pravastatin 20 mg daily. Recheck lipids/lfts in 2-3 months.  OSA: on CPAP, trying to get a new device as her current mask hurts her face. Pending repeat sleep study.  Cardiac risk counseling and prevention recommendations: -recommend heart healthy/Mediterranean diet, with whole grains, fruits, vegetable, fish, lean meats, nuts, and olive oil. Limit salt. -recommend moderate walking, 3-5 times/week for 30-50 minutes each session. Aim for at least 150 minutes.week. Goal should be pace of 3 miles/hours, or walking 1.5 miles in 30 minutes -recommend avoidance of tobacco products. Avoid excess alcohol. -ASCVD risk score: The  10-year ASCVD risk score Mikey Bussing DC Brooke Bonito., et al., 2013) is: 6.4%   Values used to calculate the score:     Age: 28 years     Sex: Female     Is Non-Hispanic African American: No     Diabetic: No     Tobacco smoker: No     Systolic Blood Pressure: 641 mmHg     Is BP treated: No     HDL Cholesterol: 34.6 mg/dL     Total Cholesterol: 171 mg/dL    Plan for follow up: 1 year or sooner  as needed  Buford Dresser, MD, PhD, [facility]    Medication Adjustments/Labs and Tests Ordered: Current medicines are reviewed at length with the patient today.  Concerns regarding medicines are outlined above.  Orders Placed This Encounter  Procedures  . EKG 12-Lead   Meds ordered this encounter  Medications  . pravastatin (PRAVACHOL) 20 MG tablet    Sig: Take 1 tablet (20 mg total) by mouth every evening.    Dispense:  90 tablet    Refill:  3    Patient Instructions  Medication Instructions:  Start taking Pravastatin 20 mg daily  *If you need a refill on your cardiac medications before your next appointment, please call your pharmacy*   Lab Work: Your physician recommends that you return for lab work in 2-3 months ( fasting lipid/LFT)  If you have labs (blood work) drawn today and your tests are completely normal, you will receive your results only by: Marland Kitchen MyChart Message (if you have MyChart) OR . A paper copy in the mail If you have any lab test that is abnormal or we need to change your treatment, we will call you to review the results.   Testing/Procedures: None ordered today   Follow-Up: At [facility], you and your health needs are our priority.  As part of our continuing mission to provide you with exceptional heart care, we have created designated Provider Care Teams.  These Care Teams include your primary Cardiologist (physician) and Advanced Practice Providers (APPs -  Physician Assistants and Nurse Practitioners) who all work together to provide  you with the care you need, when you need it.  We recommend signing up for the patient portal called "MyChart".  Sign up information is provided on this After Visit Summary.  MyChart is used to connect with patients for Virtual Visits (Telemedicine).  Patients are able to view lab/test results, encounter notes, upcoming appointments, etc.  Non-urgent messages can be sent to your provider as well.   To learn more about what you can do with MyChart, go to [email or web address].    Your next appointment:   1 year(s) @ [address] [place], [place] [address]   The format for your next appointment:   In Person  Provider:   Buford Dresser, MD      Signed, Buford Dresser, MD PhD 09/20/2020 2:56 PM    [place]

## 2020-09-20 NOTE — Patient Instructions (Signed)
Medication Instructions:  Start taking Pravastatin 20 mg daily  *If you need a refill on your cardiac medications before your next appointment, please call your pharmacy*   Lab Work: Your physician recommends that you return for lab work in 2-3 months ( fasting lipid/LFT)  If you have labs (blood work) drawn today and your tests are completely normal, you will receive your results only by: Marland Kitchen MyChart Message (if you have MyChart) OR . A paper copy in the mail If you have any lab test that is abnormal or we need to change your treatment, we will call you to review the results.   Testing/Procedures: None ordered today   Follow-Up: At [facility], you and your health needs are our priority.  As part of our continuing mission to provide you with exceptional heart care, we have created designated Provider Care Teams.  These Care Teams include your primary Cardiologist (physician) and Advanced Practice Providers (APPs -  Physician Assistants and Nurse Practitioners) who all work together to provide you with the care you need, when you need it.  We recommend signing up for the patient portal called "MyChart".  Sign up information is provided on this After Visit Summary.  MyChart is used to connect with patients for Virtual Visits (Telemedicine).  Patients are able to view lab/test results, encounter notes, upcoming appointments, etc.  Non-urgent messages can be sent to your provider as well.   To learn more about what you can do with MyChart, go to [email or web address].    Your next appointment:   1 year(s) @ [address] [place], [place] [address]   The format for your next appointment:   In Person  Provider:   Buford Dresser, MD

## 2020-09-24 ENCOUNTER — Encounter: Payer: Self-pay | Admitting: Cardiology

## 2020-09-26 NOTE — Telephone Encounter (Signed)
mychart message sent by pt due to not hearing anything from [facility] about coming in for HST appt. Pt was seen by [facility] 5/26 and order was placed then for the HST. Pt was made aware by me in response that I sent to her that we are at least 4 weeks out in getting pts in for their HST appt. Sending this to [facility] as an [place].

## 2020-09-27 ENCOUNTER — Encounter: Payer: Self-pay | Admitting: Internal Medicine

## 2020-09-27 NOTE — Telephone Encounter (Signed)
Time frame hasn't changed, it is still 4-6 weeks from when the HST was ordered.

## 2020-10-11 ENCOUNTER — Other Ambulatory Visit: Payer: Self-pay

## 2020-10-11 ENCOUNTER — Ambulatory Visit: Payer: Medicare Other

## 2020-10-11 DIAGNOSIS — G4733 Obstructive sleep apnea (adult) (pediatric): Secondary | ICD-10-CM | POA: Diagnosis not present

## 2020-10-12 ENCOUNTER — Telehealth: Payer: Self-pay | Admitting: Primary Care

## 2020-10-12 NOTE — Telephone Encounter (Signed)
I have updated pt's immunization list showing only the correct 4 covid vaccines that pt has had. Called and spoke with pt letting her know this had been done and she verbalized understanding. Nothing further needed.

## 2020-10-15 ENCOUNTER — Ambulatory Visit (INDEPENDENT_AMBULATORY_CARE_PROVIDER_SITE_OTHER): Payer: Medicare Other | Admitting: Orthopedic Surgery

## 2020-10-15 ENCOUNTER — Encounter: Payer: Self-pay | Admitting: Orthopedic Surgery

## 2020-10-15 DIAGNOSIS — M25512 Pain in left shoulder: Secondary | ICD-10-CM

## 2020-10-15 DIAGNOSIS — G8929 Other chronic pain: Secondary | ICD-10-CM

## 2020-10-15 NOTE — Progress Notes (Signed)
Office Visit Note   Patient: Chelsey Jackson           Date of Birth: 1950/12/17           MRN: [id number] Visit Date: 10/15/2020 Requested by: Orma Flaming, [place],  [place] [address] PCP: [name], [place]  Subjective: Chief Complaint  Patient presents with   Left Shoulder - Follow-up    HPI: Chelsey Jackson is a 70 year old patient with left shoulder pain.  She had left glenohumeral joint injection which gave her 2 weeks of very good relief.  That was done 6 weeks ago.  She reports some constant aching in the shoulder.  MRI scan shows bursal sided partial-thickness rotator cuff tear as well as moderate glenohumeral joint arthritis.  Pain localizes to the shoulder region.  She is been using an ice pack which helps.  Also uses a pillow under her elbow.  Denies any radicular symptoms or neck pain.              ROS: All systems reviewed are negative as they relate to the chief complaint within the history of present illness.  Patient denies  fevers or chills.   Assessment & Plan: Visit Diagnoses:  1. Chronic left shoulder pain     Plan: Impression is left shoulder pain with possible very mild early adhesive capsulitis or arthritis causing symptoms.  I do not think it is the bursal sided rotator cuff tear based on response to subacromial injection.  More likely that this is arthritis giving her symptoms.  Nothing definitively operative at this time.  Lets wait this out and see how she is doing in about 3 months.  Follow-Up Instructions: Return in about 3 months (around 01/15/2021).   Orders:  No orders of the defined types were placed in this encounter.  No orders of the defined types were placed in this encounter.     Procedures: No procedures performed   Clinical Data: No additional findings.  Objective: Vital Signs: LMP  (LMP Unknown)   Physical Exam:   Constitutional: Patient appears well-developed HEENT:  Head: Normocephalic Eyes:EOM are  normal Neck: Normal range of motion Cardiovascular: Normal rate Pulmonary/chest: Effort normal Neurologic: Patient is alert Skin: Skin is warm Psychiatric: Patient has normal mood and affect   Ortho Exam: Ortho exam demonstrates full active and passive range of motion of the cervical spine.  Range of motion left shoulder passively is 65/100/170.  Rotator cuff strength is good infraspinatus supraspinatus subscap muscle testing.  No discrete AC joint tenderness to direct palpation.  No coarse grinding or crepitus with active or passive range of motion left shoulder.  Negative O'Brien's testing.  No other masses lymphadenopathy or skin changes noted in the shoulder girdle region.  Specialty Comments:  No specialty comments available.  Imaging: No results found.   PMFS History: Patient Active Problem List   Diagnosis Date Noted   Allergic rhinitis 09/12/2020   Allergic rhinitis due to animal (cat) (dog) hair and dander 09/12/2020   Mild intermittent asthma 09/12/2020   Vasomotor rhinitis 09/12/2020   Hyperlipidemia, mild 05/17/2020   Impingement syndrome of left shoulder 04/03/2020   Urge incontinence 11/05/2018   Diverticulosis of colon without hemorrhage    Primary osteoarthritis of first carpometacarpal joint of left hand 06/22/2018   Chronic pain of left thumb 06/22/2018   History of retinal tear 10/20/2016   Low back pain 09/28/2015   Insomnia 08/18/2013   OSA (obstructive sleep apnea) 07/31/2013  Thyroid nodule, cold 07/15/2012   Anxiety and depression 10/05/2007   Allergic rhinitis due to pollen 10/05/2007   Asthma 10/05/2007   Past Medical History:  Diagnosis Date   AC (acromioclavicular) joint bone spurs    left shoulder with chronic pain, w/limitation ROM   Allergy    weekly allergy shots   Anxiety    Arthritis    Asthma    Complication of anesthesia    difficult intubation   Depression    Fibrocystic breast disease    GERD (gastroesophageal reflux disease)     OSA (obstructive sleep apnea) 07/31/2013   Peptic ulcer disease    TMJ locking     Family History  Problem Relation Age of Onset   Osteoporosis Mother    Fibroids Mother    Other Mother        aortic valve replacement 1994, abdominal aortic aneurysm   Lung cancer Mother 50   Heart disease Father    Testicular cancer Father 43   Prostate cancer Father 62   Diabetes Maternal Grandmother    Heart disease Maternal Grandfather    Heart disease Paternal Grandmother    Diabetes Paternal Grandfather    Stomach cancer Paternal Grandfather    Breast cancer Paternal Aunt    Breast cancer Cousin    Colon cancer Neg Hx    Throat cancer Neg Hx    Pancreatic cancer Neg Hx     Past Surgical History:  Procedure Laterality Date   ABDOMINAL HYSTERECTOMY  2003   due to fibroids   ADENOIDECTOMY     x3   BIOPSY  06/29/2018   Procedure: BIOPSY;  Surgeon: Lavena Bullion, DO;  Location: [facility];  Service: Gastroenterology;;   BREAST CYST ASPIRATION     COLONOSCOPY  03/10/2007   results-normal   COLONOSCOPY WITH PROPOFOL N/A 06/29/2018   Procedure: COLONOSCOPY WITH PROPOFOL;  Surgeon: Lavena Bullion, DO;  Location: [facility];  Service: Gastroenterology;  Laterality: N/A;   HAMMER TOE SURGERY     POLYPECTOMY  06/29/2018   Procedure: POLYPECTOMY;  Surgeon: Lavena Bullion, DO;  Location: [facility];  Service: Gastroenterology;;   PUBOVAGINAL SLING  '90's   for incontinence   [place] Right 2013   [facility]   ROTATOR CUFF REPAIR Left 2012   [facility]   SHOULDER SURGERY     TONSILLECTOMY     x2   [place]   WRIST SURGERY     Social History   Occupational History   Not on file  Tobacco Use   Smoking status: Former    Packs/day: 2.00    Years: 15.00    Pack years: 30.00    Types: Cigarettes    Quit date: 11/19/1989    Years since quitting: 30.9   Smokeless tobacco: Never  Vaping Use   Vaping  Use: Never used  Substance and Sexual Activity   Alcohol use: Yes    Comment: maybe a glass of wine on the weekend   Drug use: No   Sexual activity: Not on file

## 2020-10-18 ENCOUNTER — Other Ambulatory Visit: Payer: Self-pay | Admitting: Family Medicine

## 2020-10-19 ENCOUNTER — Other Ambulatory Visit: Payer: Self-pay | Admitting: Family Medicine

## 2020-10-19 MED ORDER — TEMAZEPAM 30 MG PO CAPS: 30.0000 mg | ORAL_CAPSULE | Freq: Every day | ORAL | 0 refills | Status: DC

## 2020-10-23 DIAGNOSIS — G4733 Obstructive sleep apnea (adult) (pediatric): Secondary | ICD-10-CM

## 2020-10-24 ENCOUNTER — Other Ambulatory Visit: Payer: Self-pay | Admitting: Family Medicine

## 2020-10-24 MED ORDER — BUPROPION HCL ER (XL) 300 MG PO TB24: 300.0000 mg | ORAL_TABLET | Freq: Every day | ORAL | 0 refills | Status: DC

## 2020-10-25 NOTE — Telephone Encounter (Signed)
Created another MyChart encounter to send an attachment of her HST results. Will close this encounter.

## 2020-10-26 ENCOUNTER — Telehealth: Payer: Self-pay | Admitting: Primary Care

## 2020-10-26 NOTE — Telephone Encounter (Signed)
Reviewed HST results with pt. HST results will be faxed to Dr. Toy Cookey. Pt stated understanding. Nothing further needed at this time.

## 2020-10-26 NOTE — Telephone Encounter (Signed)
Please let patient know HST showed moderate OSA, AHI 17/hr. She was referred to Dr. Toy Cookey, we can fax results to their office

## 2020-10-26 NOTE — Telephone Encounter (Signed)
ATC left voicemail to return call to office

## 2020-11-06 ENCOUNTER — Encounter (HOSPITAL_BASED_OUTPATIENT_CLINIC_OR_DEPARTMENT_OTHER): Payer: Self-pay

## 2020-11-09 ENCOUNTER — Other Ambulatory Visit (HOSPITAL_COMMUNITY): Payer: Self-pay | Admitting: Obstetrics & Gynecology

## 2020-11-09 DIAGNOSIS — Z1231 Encounter for screening mammogram for malignant neoplasm of breast: Secondary | ICD-10-CM

## 2020-12-19 ENCOUNTER — Encounter: Payer: Self-pay | Admitting: Physician Assistant

## 2020-12-20 LAB — HEPATIC FUNCTION PANEL
ALT: 24 IU/L (ref 0–32)
AST: 22 IU/L (ref 0–40)
Albumin: 5 g/dL — ABNORMAL HIGH (ref 3.8–4.8)
Alkaline Phosphatase: 63 IU/L (ref 44–121)
Bilirubin Total: 0.4 mg/dL (ref 0.0–1.2)
Bilirubin, Direct: 0.13 mg/dL (ref 0.00–0.40)
Total Protein: 6.8 g/dL (ref 6.0–8.5)

## 2020-12-20 LAB — LIPID PANEL
Chol/HDL Ratio: 5.4 ratio — ABNORMAL HIGH (ref 0.0–4.4)
Cholesterol, Total: 199 mg/dL (ref 100–199)
HDL: 37 mg/dL — ABNORMAL LOW (ref >39)
LDL Chol Calc (NIH): 136 mg/dL — ABNORMAL HIGH (ref 0–99)
Triglycerides: 142 mg/dL (ref 0–149)
VLDL Cholesterol Cal: 26 mg/dL (ref 5–40)

## 2020-12-20 NOTE — Telephone Encounter (Signed)
Chelsey Jackson, please see message and advise. Pt has TOC in Nov with you.

## 2020-12-20 NOTE — Telephone Encounter (Signed)
I am not familiar with these (don't use them in cardiology). Would this be something you all could assist her with? I am only familiar with over the counter sea sickness treatments. Thanks.

## 2020-12-21 MED ORDER — SCOPOLAMINE 1 MG/3DAYS TD PT72: 1.0000 | MEDICATED_PATCH | TRANSDERMAL | 0 refills | Status: DC

## 2020-12-26 ENCOUNTER — Other Ambulatory Visit (HOSPITAL_BASED_OUTPATIENT_CLINIC_OR_DEPARTMENT_OTHER): Payer: Self-pay

## 2020-12-26 DIAGNOSIS — Z79899 Other long term (current) drug therapy: Secondary | ICD-10-CM

## 2020-12-26 MED ORDER — ROSUVASTATIN CALCIUM 5 MG PO TABS: 5.0000 mg | ORAL_TABLET | Freq: Every day | ORAL | 3 refills | Status: DC

## 2020-12-26 NOTE — Telephone Encounter (Signed)
Please see updated lab results and orders.

## 2021-01-04 ENCOUNTER — Telehealth (INDEPENDENT_AMBULATORY_CARE_PROVIDER_SITE_OTHER): Payer: Medicare Other | Admitting: Family Medicine

## 2021-01-04 ENCOUNTER — Encounter: Payer: Self-pay | Admitting: Family Medicine

## 2021-01-04 ENCOUNTER — Encounter: Payer: Self-pay | Admitting: Physician Assistant

## 2021-01-04 ENCOUNTER — Other Ambulatory Visit: Payer: Self-pay

## 2021-01-04 VITALS — Ht 68.0 in | Wt 190.0 lb

## 2021-01-04 DIAGNOSIS — R059 Cough, unspecified: Secondary | ICD-10-CM

## 2021-01-04 MED ORDER — MOLNUPIRAVIR EUA 200MG CAPSULE: 4.0000 | ORAL_CAPSULE | Freq: Two times a day (BID) | ORAL | 0 refills | Status: AC

## 2021-01-04 NOTE — Assessment & Plan Note (Addendum)
Highly worrisome for COVID or flu despite negative home test 1 day after symptoms started.   Exposure to COVID at recent trip to wedding    Likely COVID19  infection vs other viral infection. No clear sign of bacterial infection at this time. TESTING for flu and COVID 19 pending. No SOB.  No red flags/need for ER visit or in-person exam at respiratory clinic at this time.Chelsey Jackson in rx for molnupiravir given increased risk for COVID complications ( age, asthma and weight) given she is on meds with paxlovid contraindications. She will only take antiviral if COVID PCR test returns positive.  Pt moderate risk for COVID complications given  asthma, immunocompromised state, CAD, DM, obesity and age. If SOB begins symptoms worsening.. have low threshold for in-person exam, if severe shortness of breath ER visit recommended.  Can monitor Oxygen saturation at home with home monitor if able to obtain.  Go to ER if O2 sat < 90% on room air.  Reviewed home care and provided information through [facility].  Recommended quarantine until test returns. If returns positive 5 days isolation recommended. Return to work day 6 and wear mask for 4 more days to complete 10 days. Provided info about prevention of spread of COVID 19.

## 2021-01-04 NOTE — Progress Notes (Signed)
VIRTUAL VISIT Due to national recommendations of social distancing due to [place] 19, a virtual visit is felt to be most appropriate for this patient at this time.   I connected with the patient on 01/04/21 at  2:00 PM EDT by virtual telehealth platform and verified that I am speaking with the correct person using two identifiers.   I discussed the limitations, risks, security and privacy concerns of performing an evaluation and management service by  virtual telehealth platform and the availability of in person appointments. I also discussed with the patient that there may be a patient responsible charge related to this service. The patient expressed understanding and agreed to proceed.  Patient location: Home Provider Location: [facility] [place] [facility] Participants: Eliezer Lofts and [name]   Chief Complaint  Patient presents with   covid symptoms   Cough   Sore Throat   Headache   Nausea   Diarrhea    History of Present Illness: 70 year old female patient of Chelsey Jackson, [place] with history of mild asthma OSA, BMI28 present with new onset cough, sore throat, headache, nausea and diarrhea.   01/03/21 symptoms started.. ST, head congestion. Chest congestion, productive cough.  No fever, decreased energy. No myalgia.  She has been using Xyzal for allergies. Drinking lots of water.   No current wheeze, SOB... albuterol prn.. not needed yet.  COVID 19 screen: home test negative. COVID testing: [facility] PCR test/ flu... result pending COVID vaccine: 08/08/2020 , 12/13/2019 , 06/02/2019 , 05/12/2019 COVID exposure:  She just returned from wedding in [place]. DID not wear a mask. 3 people at weeding tested positive   The importance of social distancing was discussed today.    Review of Systems  Constitutional:  Negative for chills and fever.  HENT:  Positive for congestion and sore throat. Negative for ear pain.   Eyes:  Negative for pain and redness.  Respiratory:  Positive for  cough. Negative for shortness of breath.   Cardiovascular:  Negative for chest pain, palpitations and leg swelling.  Gastrointestinal:  Negative for abdominal pain, blood in stool, constipation, diarrhea, nausea and vomiting.  Genitourinary:  Negative for dysuria.  Musculoskeletal:  Negative for falls and myalgias.  Skin:  Negative for rash.  Neurological:  Negative for dizziness.  Psychiatric/Behavioral:  Negative for depression. The patient is not nervous/anxious.      Past Medical History:  Diagnosis Date   AC (acromioclavicular) joint bone spurs    left shoulder with chronic pain, w/limitation ROM   Allergy    weekly allergy shots   Anxiety    Arthritis    Asthma    Complication of anesthesia    difficult intubation   Depression    Fibrocystic breast disease    GERD (gastroesophageal reflux disease)    OSA (obstructive sleep apnea) 07/31/2013   Peptic ulcer disease    TMJ locking     reports that she quit smoking about 31 years ago. Her smoking use included cigarettes. She has a 30.00 pack-year smoking history. She has never used smokeless tobacco. She reports current alcohol use. She reports that she does not use drugs.   Current Outpatient Medications:    buPROPion (WELLBUTRIN XL) 300 MG 24 hr tablet, Take 1 tablet (300 mg total) by mouth daily., Disp: 90 tablet, Rfl: 0   Calcium Carbonate-Vitamin D 600-400 MG-UNIT tablet, Take 1 tablet by mouth daily., Disp: , Rfl:    diclofenac sodium (VOLTAREN) 1 % GEL, Apply 2-4 g topically  4 (four) times daily. (Patient taking differently: Apply 2-4 g topically 4 (four) times daily as needed.), Disp: 6 Tube, Rfl: 2   EPINEPHrine 0.3 mg/0.3 mL IJ SOAJ injection, Inject into the muscle once., Disp: , Rfl:    fluticasone (FLONASE) 50 MCG/ACT nasal spray, Place 2 sprays into both nostrils daily. , Disp: , Rfl:    Iron-Vitamin C 65-125 MG TABS, Take 1 tablet by mouth daily., Disp: , Rfl:    levocetirizine (XYZAL) 5 MG tablet, Take 5 mg by  mouth every evening., Disp: , Rfl:    Multiple Vitamin (MULTIVITAMIN) tablet, Take 1 tablet by mouth daily., Disp: , Rfl:    PROAIR RESPICLICK 123XX123 (90 Base) MCG/ACT AEPB, as needed. , Disp: , Rfl:    rosuvastatin (CRESTOR) 5 MG tablet, Take 1 tablet (5 mg total) by mouth daily., Disp: 90 tablet, Rfl: 3   scopolamine (TRANSDERM-SCOP) 1 MG/3DAYS, Place 1 patch (1.5 mg total) onto the skin every 3 (three) days., Disp: 10 patch, Rfl: 0   temazepam (RESTORIL) 30 MG capsule, Take 1 capsule (30 mg total) by mouth at bedtime., Disp: 90 capsule, Rfl: 0  Current Facility-Administered Medications:    0.9 %  sodium chloride infusion, 500 mL, Intravenous, Once, [place], MD   Observations/Objective: Height '5\' 8"'$  (1.727 m), weight 190 lb (86.2 kg).  Physical Exam  Physical Exam Constitutional:      General: The patient is not in acute distress. Pulmonary:     Effort: Pulmonary effort is normal. No respiratory distress.  Neurological:     Mental Status: The patient is alert and oriented to person, place, and time.  Psychiatric:        Mood and Affect: Mood normal.        Behavior: Behavior normal.   Assessment and Plan Problem List Items Addressed This Visit     Cough - Primary    Highly worrisome for COVID or flu despite negative home test 1 day after symptoms started.   Exposure to COVID at recent trip to wedding    Likely COVID19  infection vs other viral infection. No clear sign of bacterial infection at this time. TESTING for flu and COVID 19 pending. No SOB.  No red flags/need for ER visit or in-person exam at respiratory clinic at this time.Durene Cal in rx for molnupiravir given increased risk for COVID complications ( age, asthma and weight) given she is on meds with paxlovid contraindications. She will only take antiviral if COVID PCR test returns positive.  Pt moderate risk for COVID complications given  asthma, immunocompromised state, CAD, DM, obesity and age. If SOB  begins symptoms worsening.. have low threshold for in-person exam, if severe shortness of breath ER visit recommended.  Can monitor Oxygen saturation at home with home monitor if able to obtain.  Go to ER if O2 sat < 90% on room air.  Reviewed home care and provided information through [facility].  Recommended quarantine until test returns. If returns positive 5 days isolation recommended. Return to work day 6 and wear mask for 4 more days to complete 10 days. Provided info about prevention of spread of COVID 19.          I discussed the assessment and treatment plan with the patient. The patient was provided an opportunity to ask questions and all were answered. The patient agreed with the plan and demonstrated an understanding of the instructions.   The patient was advised to call back or seek an  in-person evaluation if the symptoms worsen or if the condition fails to improve as anticipated.     Eliezer Lofts, MD

## 2021-01-09 ENCOUNTER — Encounter: Payer: Medicare Other | Admitting: Physician Assistant

## 2021-01-19 ENCOUNTER — Other Ambulatory Visit: Payer: Self-pay | Admitting: Family Medicine

## 2021-01-21 ENCOUNTER — Other Ambulatory Visit: Payer: Self-pay

## 2021-01-21 ENCOUNTER — Ambulatory Visit (INDEPENDENT_AMBULATORY_CARE_PROVIDER_SITE_OTHER): Payer: Medicare Other | Admitting: Orthopedic Surgery

## 2021-01-21 ENCOUNTER — Other Ambulatory Visit: Payer: Self-pay | Admitting: Obstetrics & Gynecology

## 2021-01-21 ENCOUNTER — Encounter: Payer: Self-pay | Admitting: Orthopedic Surgery

## 2021-01-21 DIAGNOSIS — Z1231 Encounter for screening mammogram for malignant neoplasm of breast: Secondary | ICD-10-CM

## 2021-01-21 DIAGNOSIS — M19012 Primary osteoarthritis, left shoulder: Secondary | ICD-10-CM

## 2021-01-21 DIAGNOSIS — M25512 Pain in left shoulder: Secondary | ICD-10-CM | POA: Diagnosis not present

## 2021-01-21 MED ORDER — BUPIVACAINE HCL 0.5 % IJ SOLN
9.0000 mL | INTRAMUSCULAR | Status: AC | PRN
  Administered 2021-01-21: 9 mL via INTRA_ARTICULAR

## 2021-01-21 MED ORDER — LIDOCAINE HCL 1 % IJ SOLN
5.0000 mL | INTRAMUSCULAR | Status: AC | PRN
  Administered 2021-01-21: 5 mL

## 2021-01-21 MED ORDER — METHYLPREDNISOLONE ACETATE 40 MG/ML IJ SUSP
40.0000 mg | INTRAMUSCULAR | Status: AC | PRN
  Administered 2021-01-21: 40 mg via INTRA_ARTICULAR

## 2021-01-21 NOTE — Progress Notes (Addendum)
Office Visit Note   Patient: Chelsey Jackson           Date of Birth: Sep 07, 1950           MRN: [id number] Visit Date: 01/21/2021 Requested by: Chelsey Jackson, Chelsey Jackson [address],  [place] [address] PCP: Chelsey Dresser, MD  Subjective: Chief Complaint  Patient presents with   Left Shoulder - Pain, Follow-up    HPI: Chelsey Jackson is a 70 year old patient with left shoulder pain.  Reports aching at times.  Takes Aleve at times.  Overall the last injection gave her reasonable relief for several weeks.  Some days she has no pain some days she has achiness and pain in the shoulder region.  Denies any radicular symptoms.  Last injection on chart review 522 and the glenohumeral joint              ROS: All systems reviewed are negative as they relate to the chief complaint within the history of present illness.  Patient denies  fevers or chills.   Assessment & Plan: Visit Diagnoses:  1. Primary osteoarthritis, left shoulder     Plan: Impression is left shoulder pain.  Plan is divided injection into the subacromial space and left shoulder joint today.  69-month return for clinical recheck and decision for or against final injection.  Her symptoms really do not warrant surgical intervention at this time.  Strength is reasonable.  Follow-up in 3 months for clinical recheck.  Follow-Up Instructions: Return in about 3 months (around 04/23/2021).   Orders:  No orders of the defined types were placed in this encounter.  No orders of the defined types were placed in this encounter.     Procedures: Large Joint Inj: L glenohumeral on 01/21/2021 10:13 PM Indications: diagnostic evaluation and pain Details: 18 G 1.5 in needle, posterior approach  Arthrogram: No  Medications: 9 mL bupivacaine 0.5 %; 40 mg methylPREDNISolone acetate 40 MG/ML; 5 mL lidocaine 1 % Outcome: tolerated well, no immediate complications Procedure, treatment alternatives, risks and benefits  explained, specific risks discussed. Consent was given by the patient. Immediately prior to procedure a time out was called to verify the correct patient, procedure, equipment, support staff and site/side marked as required. Patient was prepped and draped in the usual sterile fashion.      Clinical Data: No additional findings.  Objective: Vital Signs: LMP  (LMP Unknown)   Physical Exam:   Constitutional: Patient appears well-developed HEENT:  Head: Normocephalic Eyes:EOM are normal Neck: Normal range of motion Cardiovascular: Normal rate Pulmonary/chest: Effort normal Neurologic: Patient is alert Skin: Skin is warm Psychiatric: Patient has normal mood and affect   Ortho Exam: Ortho exam demonstrates full active and passive range of motion of the cervical spine.  Left shoulder has maintained motion with no progression of any type of stiffness.  Motion currently is 65/100/170 on the left.  Does have mild AC joint tenderness.  Rotator cuff strength is intact infraspinatus supraspinatus and subscap testing.  Realistically the only clicking I get is with crossarm adduction and internal and external rotation at 90 degrees.  This may be the [employer] joint or it may be possible partial-thickness rotator cuff tearing.  Specialty Comments:  No specialty comments available.  Imaging: No results found.   PMFS History: Patient Active Problem List   Diagnosis Date Noted   Allergic rhinitis 09/12/2020   Allergic rhinitis due to animal (cat) (dog) hair and dander 09/12/2020   Mild intermittent  asthma 09/12/2020   Vasomotor rhinitis 09/12/2020   Hyperlipidemia, mild 05/17/2020   Impingement syndrome of left shoulder 04/03/2020   Urge incontinence 11/05/2018   Diverticulosis of colon without hemorrhage    Primary osteoarthritis of first carpometacarpal joint of left hand 06/22/2018   Chronic pain of left thumb 06/22/2018   History of retinal tear 10/20/2016   Cough 05/02/2016   Low back  pain 09/28/2015   Insomnia 08/18/2013   OSA (obstructive sleep apnea) 07/31/2013   Thyroid nodule, cold 07/15/2012   Anxiety and depression 10/05/2007   Allergic rhinitis due to pollen 10/05/2007   Asthma 10/05/2007   Past Medical History:  Diagnosis Date   AC (acromioclavicular) joint bone spurs    left shoulder with chronic pain, w/limitation ROM   Allergy    weekly allergy shots   Anxiety    Arthritis    Asthma    Complication of anesthesia    difficult intubation   Depression    Fibrocystic breast disease    GERD (gastroesophageal reflux disease)    OSA (obstructive sleep apnea) 07/31/2013   Peptic ulcer disease    TMJ locking     Family History  Problem Relation Age of Onset   Osteoporosis Mother    Fibroids Mother    Other Mother        aortic valve replacement 1994, abdominal aortic aneurysm   Lung cancer Mother 79   Heart disease Father    Testicular cancer Father 64   Prostate cancer Father 25   Diabetes Maternal Grandmother    Heart disease Maternal Grandfather    Heart disease Paternal Grandmother    Diabetes Paternal Grandfather    Stomach cancer Paternal Grandfather    Breast cancer Paternal Aunt    Breast cancer Cousin    Colon cancer Neg Hx    Throat cancer Neg Hx    Pancreatic cancer Neg Hx     Past Surgical History:  Procedure Laterality Date   ABDOMINAL HYSTERECTOMY  2003   due to fibroids   ADENOIDECTOMY     x3   BIOPSY  06/29/2018   Procedure: BIOPSY;  Surgeon: Lavena Bullion, DO;  Location: [facility];  Service: Gastroenterology;;   BREAST CYST ASPIRATION     COLONOSCOPY  03/10/2007   results-normal   COLONOSCOPY WITH PROPOFOL N/A 06/29/2018   Procedure: COLONOSCOPY WITH PROPOFOL;  Surgeon: Lavena Bullion, DO;  Location: [facility];  Service: Gastroenterology;  Laterality: N/A;   HAMMER TOE SURGERY     POLYPECTOMY  06/29/2018   Procedure: POLYPECTOMY;  Surgeon: Lavena Bullion, DO;  Location: [facility];  Service:  Gastroenterology;;   PUBOVAGINAL SLING  '90's   for incontinence   [place] Right 2013   [facility]   ROTATOR CUFF REPAIR Left 2012   [facility]   SHOULDER SURGERY     TONSILLECTOMY     x2   [place]   WRIST SURGERY     Social History   Occupational History   Not on file  Tobacco Use   Smoking status: Former    Packs/day: 2.00    Years: 15.00    Pack years: 30.00    Types: Cigarettes    Quit date: 11/19/1989    Years since quitting: 31.1   Smokeless tobacco: Never  Vaping Use   Vaping Use: Never used  Substance and Sexual Activity   Alcohol use: Yes  Comment: maybe a glass of wine on the weekend   Drug use: No   Sexual activity: Not on file

## 2021-01-25 ENCOUNTER — Other Ambulatory Visit: Payer: Self-pay

## 2021-01-25 ENCOUNTER — Ambulatory Visit
Admission: RE | Admit: 2021-01-25 | Discharge: 2021-01-25 | Disposition: A | Discharge status: Home / Self Care | Payer: Medicare Other | Source: Ambulatory Visit | Attending: Obstetrics & Gynecology | Admitting: Obstetrics & Gynecology

## 2021-01-25 DIAGNOSIS — Z1231 Encounter for screening mammogram for malignant neoplasm of breast: Secondary | ICD-10-CM

## 2021-01-28 ENCOUNTER — Telehealth: Payer: Self-pay | Admitting: Family Medicine

## 2021-01-28 ENCOUNTER — Other Ambulatory Visit: Payer: Self-pay | Admitting: Physician Assistant

## 2021-01-28 MED ORDER — TEMAZEPAM 30 MG PO CAPS
30.0000 mg | ORAL_CAPSULE | Freq: Every day | ORAL | 0 refills | Status: DC
Start: 2021-01-28 — End: 2021-01-30

## 2021-01-28 NOTE — Telephone Encounter (Signed)
Please see message. °

## 2021-01-28 NOTE — Telephone Encounter (Signed)
Patient is calling in states she is completely out as of today and is wondering if she can get enough medication to last until she comes in for an appointment on Wednesday.

## 2021-01-29 NOTE — Telephone Encounter (Signed)
Spoke to pt told her Rx for Temazepam was sent to pharmacy. Pt verbalized understanding.

## 2021-01-30 ENCOUNTER — Ambulatory Visit (INDEPENDENT_AMBULATORY_CARE_PROVIDER_SITE_OTHER): Payer: Medicare Other | Admitting: Physician Assistant

## 2021-01-30 ENCOUNTER — Other Ambulatory Visit: Payer: Self-pay

## 2021-01-30 ENCOUNTER — Encounter: Payer: Self-pay | Admitting: Physician Assistant

## 2021-01-30 VITALS — BP 138/82 | HR 66 | Temp 97.7°F | Wt 188.6 lb

## 2021-01-30 DIAGNOSIS — F5101 Primary insomnia: Secondary | ICD-10-CM

## 2021-01-30 DIAGNOSIS — M545 Low back pain, unspecified: Secondary | ICD-10-CM

## 2021-01-30 DIAGNOSIS — F419 Anxiety disorder, unspecified: Secondary | ICD-10-CM | POA: Diagnosis not present

## 2021-01-30 DIAGNOSIS — G8929 Other chronic pain: Secondary | ICD-10-CM

## 2021-01-30 DIAGNOSIS — F32A Depression, unspecified: Secondary | ICD-10-CM

## 2021-01-30 DIAGNOSIS — J4520 Mild intermittent asthma, uncomplicated: Secondary | ICD-10-CM

## 2021-01-30 MED ORDER — PROAIR RESPICLICK 108 (90 BASE) MCG/ACT IN AEPB: 1.0000 | INHALATION_SPRAY | Freq: Four times a day (QID) | RESPIRATORY_TRACT | 1 refills | Status: DC | PRN

## 2021-01-30 MED ORDER — TEMAZEPAM 30 MG PO CAPS: 30.0000 mg | ORAL_CAPSULE | Freq: Every day | ORAL | 1 refills | Status: DC

## 2021-01-30 NOTE — Progress Notes (Signed)
Chelsey Jackson is a 70 y.o. female here for a follow up of a pre-existing problem.  History of Present Illness:   Chief Complaint  Patient presents with   Transitions Of Care   Medication Refill    Temazepam needs to be filled for 90 days vs 30 days for insurance to cover   Back Pain    Currently being treated by acupuncture  Needing referral ordered so that Medicare will cover     HPI  Insomnia -- currently taking restoril 30 mg daily. Denies any concerns with this medication. Did take Ambien for a few years but caused very deep, but short sleep. Denies falls or excessive grogginess.  Back pain -- has two young grandchildren and was having difficulty with lifting. She had a few acute visits with primary care and PT for this. Has been trying acupuncture and this has helped for this. Would like prescription for this.  Depression -- stable on 300 mg wellbutrin xl daily. Tolerating well overall. Denies SI/HI.  Asthma -- well controlled. Albuterol inhaler prn. Very rare.  Past Medical History:  Diagnosis Date   AC (acromioclavicular) joint bone spurs    left shoulder with chronic pain, w/limitation ROM   Allergy    weekly allergy shots   Anxiety    Arthritis    Asthma    Complication of anesthesia    difficult intubation   Depression    Fibrocystic breast disease    GERD (gastroesophageal reflux disease)    OSA (obstructive sleep apnea) 07/31/2013   Peptic ulcer disease    TMJ locking      Social History   Tobacco Use   Smoking status: Former    Packs/day: 2.00    Years: 15.00    Pack years: 30.00    Types: Cigarettes    Quit date: 11/19/1989    Years since quitting: 31.2   Smokeless tobacco: Never  Vaping Use   Vaping Use: Never used  Substance Use Topics   Alcohol use: Yes    Comment: maybe a glass of wine on the weekend   Drug use: No    Past Surgical History:  Procedure Laterality Date   ABDOMINAL HYSTERECTOMY  2003   due to fibroids   ADENOIDECTOMY      x3   BIOPSY  06/29/2018   Procedure: BIOPSY;  Surgeon: Lavena Bullion, DO;  Location: [facility];  Service: Gastroenterology;;   BREAST CYST ASPIRATION     COLONOSCOPY  03/10/2007   results-normal   COLONOSCOPY WITH PROPOFOL N/A 06/29/2018   Procedure: COLONOSCOPY WITH PROPOFOL;  Surgeon: Lavena Bullion, DO;  Location: [facility];  Service: Gastroenterology;  Laterality: N/A;   HAMMER TOE SURGERY     POLYPECTOMY  06/29/2018   Procedure: POLYPECTOMY;  Surgeon: Lavena Bullion, DO;  Location: [facility];  Service: Gastroenterology;;   PUBOVAGINAL SLING  '90's   for incontinence   [place] Right 2013   [place]   ROTATOR CUFF REPAIR Left 2012   [place]   SHOULDER SURGERY     TONSILLECTOMY     x2   TUBAL LIGATION     TURBINATE REDUCTION  1997   WRIST SURGERY      Family History  Problem Relation Age of Onset   Osteoporosis Mother    Fibroids Mother    Other Mother        aortic valve replacement 1994, abdominal aortic aneurysm   Lung cancer Mother 60  Heart disease Father    Testicular cancer Father 87   Prostate cancer Father 72   Diabetes Maternal Grandmother    Heart disease Maternal Grandfather    Heart disease Paternal Grandmother    Diabetes Paternal Grandfather    Stomach cancer Paternal Grandfather    Breast cancer Paternal Aunt    Breast cancer Cousin    Colon cancer Neg Hx    Throat cancer Neg Hx    Pancreatic cancer Neg Hx     Allergies  Allergen Reactions   Penicillins Anaphylaxis    Stopped breathing REACTION: stop breathing    Current Medications:   Current Outpatient Medications:    buPROPion (WELLBUTRIN XL) 300 MG 24 hr tablet, TAKE ONE TABLET BY MOUTH DAILY, Disp: 90 tablet, Rfl: 0   Calcium Carbonate-Vitamin D 600-400 MG-UNIT tablet, Take 1 tablet by mouth daily., Disp: , Rfl:    diclofenac sodium (VOLTAREN) 1 % GEL, Apply 2-4 g topically 4 (four) times daily. (Patient taking differently:  Apply 2-4 g topically 4 (four) times daily as needed.), Disp: 6 Tube, Rfl: 2   EPINEPHrine 0.3 mg/0.3 mL IJ SOAJ injection, Inject into the muscle once., Disp: , Rfl:    fluticasone (FLONASE) 50 MCG/ACT nasal spray, Place 2 sprays into both nostrils daily. , Disp: , Rfl:    Iron-Vitamin C 65-125 MG TABS, Take 1 tablet by mouth daily., Disp: , Rfl:    levocetirizine (XYZAL) 5 MG tablet, Take 5 mg by mouth every evening., Disp: , Rfl:    Multiple Vitamin (MULTIVITAMIN) tablet, Take 1 tablet by mouth daily., Disp: , Rfl:    PROAIR RESPICLICK 712 (90 Base) MCG/ACT AEPB, as needed. , Disp: , Rfl:    rosuvastatin (CRESTOR) 5 MG tablet, Take 1 tablet (5 mg total) by mouth daily., Disp: 90 tablet, Rfl: 3   scopolamine (TRANSDERM-SCOP) 1 MG/3DAYS, Place 1 patch (1.5 mg total) onto the skin every 3 (three) days., Disp: 10 patch, Rfl: 0   temazepam (RESTORIL) 30 MG capsule, Take 1 capsule (30 mg total) by mouth at bedtime., Disp: 30 capsule, Rfl: 0  Current Facility-Administered Medications:    0.9 %  sodium chloride infusion, 500 mL, Intravenous, Once, [place], MD   Review of Systems:   ROS Negative unless otherwise specified per HPI.   Vitals:   Vitals:   01/30/21 1421  BP: 138/82  Pulse: 66  Temp: 97.7 F (36.5 C)  TempSrc: Temporal  SpO2: 97%  Weight: 188 lb 9.6 oz (85.5 kg)     Body mass index is 28.68 kg/m.  Physical Exam:   Physical Exam Vitals and nursing note reviewed.  Constitutional:      General: She is not in acute distress.    Appearance: She is well-developed. She is not ill-appearing or toxic-appearing.  Cardiovascular:     Rate and Rhythm: Normal rate and regular rhythm.     Pulses: Normal pulses.     Heart sounds: Normal heart sounds, S1 normal and S2 normal.  Pulmonary:     Effort: Pulmonary effort is normal.     Breath sounds: Normal breath sounds.  Skin:    General: Skin is warm and dry.  Neurological:     Mental Status: She is alert.     GCS:  GCS eye subscore is 4. GCS verbal subscore is 5. GCS motor subscore is 6.  Psychiatric:        Speech: Speech normal.        Behavior: Behavior normal. Behavior is  cooperative.    Assessment and Plan:   Primary insomnia Well controlled with temazepam 30 mg daily Follow-up in 6 months  Chronic bilateral low back pain without sciatica Well controlled with acupuncture Written rx provided Follow-up as needed  Anxiety and depression Well controlled with wellbutrin 300 mg xl daily Follow-up in 6 months  Mild intermittent asthma without complication Well controlled Albuterol refill sent for prn use Follow-up as needed  [name], PA-C

## 2021-01-30 NOTE — Patient Instructions (Signed)
It was great to see you!  Temazepam refill sent  Rx for Acupuncturist provided  Let's follow-up in 6 months, sooner if you have concerns.  Take care,  [name] PA-C

## 2021-01-31 ENCOUNTER — Other Ambulatory Visit: Payer: Self-pay | Admitting: *Deleted

## 2021-01-31 MED ORDER — ALBUTEROL SULFATE HFA 108 (90 BASE) MCG/ACT IN AERS: 1.0000 | INHALATION_SPRAY | Freq: Four times a day (QID) | RESPIRATORY_TRACT | 2 refills | Status: DC | PRN

## 2021-02-14 ENCOUNTER — Encounter: Payer: Self-pay | Admitting: Physician Assistant

## 2021-02-19 NOTE — Telephone Encounter (Signed)
Spoke to pt told her Chelsey Jackson said I think that is sufficient. Continue to rest and push fluids.  We can send in Chelsey Jackson for you if needed. I'm not sure those have been helpful for you in the past. Pt verbalized understanding and said she is not sure but she is now having wheezing. Told her I can get her in to see someone tomorrow,but will have to do a COVID test prior to appt and call with results. Pt verbalized understanding. Told her Chelsey Jackson has an opening tomorrow at 9:30. Pt said that is fine. Okay see you tomorrow. Appt scheduled.

## 2021-02-19 NOTE — Telephone Encounter (Signed)
Pt called regarding her cough. Pt stated that she has been taking the medication but her cough is not improving. Please Advise.

## 2021-02-20 ENCOUNTER — Other Ambulatory Visit: Payer: Self-pay

## 2021-02-20 ENCOUNTER — Encounter: Payer: Self-pay | Admitting: Physician Assistant

## 2021-02-20 ENCOUNTER — Ambulatory Visit (INDEPENDENT_AMBULATORY_CARE_PROVIDER_SITE_OTHER): Payer: Medicare Other | Admitting: Physician Assistant

## 2021-02-20 VITALS — BP 112/74 | HR 84 | Temp 97.8°F | Ht 68.0 in | Wt 196.2 lb

## 2021-02-20 DIAGNOSIS — R051 Acute cough: Secondary | ICD-10-CM | POA: Diagnosis not present

## 2021-02-20 MED ORDER — DOXYCYCLINE HYCLATE 100 MG PO TABS: 100.0000 mg | ORAL_TABLET | Freq: Two times a day (BID) | ORAL | 0 refills | Status: AC

## 2021-02-20 MED ORDER — HYDROCOD POLST-CPM POLST ER 10-8 MG/5ML PO SUER: 5.0000 mL | Freq: Two times a day (BID) | ORAL | 0 refills | Status: DC

## 2021-02-20 MED ORDER — PREDNISONE 20 MG PO TABS: 20.0000 mg | ORAL_TABLET | Freq: Two times a day (BID) | ORAL | 0 refills | Status: AC

## 2021-02-20 NOTE — Progress Notes (Signed)
Subjective:    Patient ID: Chelsey Jackson, female    DOB: 09-09-1950, 70 y.o.   MRN: [id number]  Chief Complaint  Patient presents with   Cough    Cough  Patient is in today for cough x 9 days. She has been pushing fluids and taking Mucinex DM. Productive cough and nasal congestion.  8 mo old grandchild recently sick with cold virus. COVID-19 at home test negative today. No fever, body aches, chills. No N/V/D. Some fatigue still lingering from COVID-19 infection in early Sept 2022.   Pt started noticing wheezing yesterday. She has been using rescue inhaler. No SOB. No CP.   Past Medical History:  Diagnosis Date   AC (acromioclavicular) joint bone spurs    left shoulder with chronic pain, w/limitation ROM   Allergy    weekly allergy shots   Anxiety    Arthritis    Asthma    Complication of anesthesia    difficult intubation   Depression    Fibrocystic breast disease    GERD (gastroesophageal reflux disease)    OSA (obstructive sleep apnea) 07/31/2013   Peptic ulcer disease    TMJ locking     Past Surgical History:  Procedure Laterality Date   ABDOMINAL HYSTERECTOMY  2003   due to fibroids   ADENOIDECTOMY     x3   BIOPSY  06/29/2018   Procedure: BIOPSY;  Surgeon: Lavena Bullion, DO;  Location: [facility];  Service: Gastroenterology;;   BREAST CYST ASPIRATION     COLONOSCOPY  03/10/2007   results-normal   COLONOSCOPY WITH PROPOFOL N/A 06/29/2018   Procedure: COLONOSCOPY WITH PROPOFOL;  Surgeon: Lavena Bullion, DO;  Location: [facility];  Service: Gastroenterology;  Laterality: N/A;   HAMMER TOE SURGERY     POLYPECTOMY  06/29/2018   Procedure: POLYPECTOMY;  Surgeon: Lavena Bullion, DO;  Location: [facility];  Service: Gastroenterology;;   PUBOVAGINAL SLING  '90's   for incontinence   [place] Right 2013   [place]   ROTATOR CUFF REPAIR Left 2012   [place]   SHOULDER SURGERY     TONSILLECTOMY     x2   TUBAL  LIGATION     TURBINATE REDUCTION  1997   WRIST SURGERY      Family History  Problem Relation Age of Onset   Osteoporosis Mother    Fibroids Mother    Other Mother        aortic valve replacement 1994, abdominal aortic aneurysm   Lung cancer Mother 28   Heart disease Father    Testicular cancer Father 44   Prostate cancer Father 31   Diabetes Maternal Grandmother    Heart disease Maternal Grandfather    Heart disease Paternal Grandmother    Diabetes Paternal Grandfather    Stomach cancer Paternal Grandfather    Breast cancer Paternal Aunt    Breast cancer Cousin    Colon cancer Neg Hx    Throat cancer Neg Hx    Pancreatic cancer Neg Hx     Social History   Tobacco Use   Smoking status: Former    Packs/day: 2.00    Years: 15.00    Pack years: 30.00    Types: Cigarettes    Quit date: 11/19/1989    Years since quitting: 31.2   Smokeless tobacco: Never  Vaping Use   Vaping Use: Never used  Substance Use Topics   Alcohol use: Yes    Comment: maybe  a glass of wine on the weekend   Drug use: No     Allergies  Allergen Reactions   Penicillins Anaphylaxis    Stopped breathing REACTION: stop breathing    Review of Systems  Respiratory:  Positive for cough.   REFER TO HPI FOR PERTINENT POSITIVES AND NEGATIVES      Objective:     BP 112/74   Pulse 84   Temp 97.8 F (36.6 C)   Ht 5\' 8"  (1.727 m)   Wt 196 lb 3.2 oz (89 kg)   LMP  (LMP Unknown)   SpO2 96%   BMI 29.83 kg/m   Wt Readings from Last 3 Encounters:  02/20/21 196 lb 3.2 oz (89 kg)  01/30/21 188 lb 9.6 oz (85.5 kg)  01/04/21 190 lb (86.2 kg)    BP Readings from Last 3 Encounters:  02/20/21 112/74  01/30/21 138/82  09/20/20 118/76     Physical Exam Vitals and nursing note reviewed.  Constitutional:      Appearance: Normal appearance. She is normal weight. She is not toxic-appearing.  HENT:     Head: Normocephalic and atraumatic.     Right Ear: Tympanic membrane, ear canal and external  ear normal.     Left Ear: Tympanic membrane, ear canal and external ear normal.     Nose: Nose normal.     Mouth/Throat:     Mouth: Mucous membranes are moist.  Eyes:     Extraocular Movements: Extraocular movements intact.     Conjunctiva/sclera: Conjunctivae normal.     Pupils: Pupils are equal, round, and reactive to light.  Cardiovascular:     Rate and Rhythm: Normal rate and regular rhythm.     Pulses: Normal pulses.     Heart sounds: Normal heart sounds.  Pulmonary:     Effort: Pulmonary effort is normal.     Breath sounds: Normal breath sounds.     Comments: Productive cough; slight wheeze R lower lung Musculoskeletal:        General: Normal range of motion.     Cervical back: Normal range of motion and neck supple.  Skin:    General: Skin is warm and dry.  Neurological:     General: No focal deficit present.     Mental Status: She is alert and oriented to person, place, and time.  Psychiatric:        Mood and Affect: Mood normal.        Behavior: Behavior normal.        Thought Content: Thought content normal.        Judgment: Judgment normal.       Assessment & Plan:   Problem List Items Addressed This Visit       Other   Cough - Primary     Meds ordered this encounter  Medications   doxycycline (VIBRA-TABS) 100 MG tablet    Sig: Take 1 tablet (100 mg total) by mouth 2 (two) times daily for 7 days.    Dispense:  14 tablet    Refill:  0   predniSONE (DELTASONE) 20 MG tablet    Sig: Take 1 tablet (20 mg total) by mouth 2 (two) times daily with a meal for 5 days.    Dispense:  10 tablet    Refill:  0   chlorpheniramine-HYDROcodone (TUSSIONEX PENNKINETIC ER) 10-8 MG/5ML SUER    Sig: Take 5 mLs by mouth 2 (two) times daily.    Dispense:  70 mL  Refill:  0    1. Acute cough Persistent symptoms >7 days despite conservative efforts at home. Will Rx Doxycycline at this time, take with food. Cautioned on antibiotic use and possible side effects. Prednisone  for added relief. Tussionex cough syrup to take at bedtime prn severe cough, no driving with this medication. Advised nasal saline, humidifier, and pushing fluids. Call if worse or no improvement.   Ailis Rigaud M Jaasia Viglione, PA-C

## 2021-03-05 ENCOUNTER — Encounter: Payer: Medicare Other | Admitting: Physician Assistant

## 2021-04-23 ENCOUNTER — Other Ambulatory Visit: Payer: Self-pay | Admitting: Cardiology

## 2021-04-23 LAB — LIPID PANEL
Chol/HDL Ratio: 4.8 ratio — ABNORMAL HIGH (ref 0.0–4.4)
Cholesterol, Total: 153 mg/dL (ref 100–199)
HDL: 32 mg/dL — ABNORMAL LOW (ref >39)
LDL Chol Calc (NIH): 91 mg/dL (ref 0–99)
Triglycerides: 174 mg/dL — ABNORMAL HIGH (ref 0–149)
VLDL Cholesterol Cal: 30 mg/dL (ref 5–40)

## 2021-04-24 ENCOUNTER — Other Ambulatory Visit: Payer: Self-pay

## 2021-04-24 ENCOUNTER — Ambulatory Visit (INDEPENDENT_AMBULATORY_CARE_PROVIDER_SITE_OTHER): Payer: Medicare Other | Admitting: Orthopedic Surgery

## 2021-04-24 DIAGNOSIS — M19012 Primary osteoarthritis, left shoulder: Secondary | ICD-10-CM

## 2021-04-25 ENCOUNTER — Other Ambulatory Visit: Payer: Self-pay | Admitting: Family Medicine

## 2021-04-25 MED ORDER — BUPROPION HCL ER (XL) 300 MG PO TB24: 300.0000 mg | ORAL_TABLET | Freq: Every day | ORAL | 0 refills | Status: DC

## 2021-04-26 ENCOUNTER — Encounter: Payer: Self-pay | Admitting: Orthopedic Surgery

## 2021-04-26 MED ORDER — LIDOCAINE HCL 1 % IJ SOLN
5.0000 mL | INTRAMUSCULAR | Status: AC | PRN
  Administered 2021-04-24: 5 mL

## 2021-04-26 MED ORDER — METHYLPREDNISOLONE ACETATE 40 MG/ML IJ SUSP
40.0000 mg | INTRAMUSCULAR | Status: AC | PRN
  Administered 2021-04-24: 40 mg via INTRA_ARTICULAR

## 2021-04-26 MED ORDER — BUPIVACAINE HCL 0.5 % IJ SOLN
9.0000 mL | INTRAMUSCULAR | Status: AC | PRN
  Administered 2021-04-24: 9 mL via INTRA_ARTICULAR

## 2021-04-26 NOTE — Progress Notes (Signed)
Office Visit Note   Patient: Chelsey Jackson           Date of Birth: 02/20/1951           MRN: [id number] Visit Date: 04/24/2021 Requested by: Buford Dresser, MD [address] [place],  [place] [id number] PCP: [name], [place]  Subjective: Chief Complaint  Patient presents with   Left Shoulder - Follow-up    HPI: Chelsey Jackson is a 71 year old patient with left shoulder arthritis.  She had an injection into the subacromial space and glenohumeral joint on 01/21/2021.  That did give her some relief.  She has been using CBD stick and Voltaren gel.  That helps a little bit.  Last clinic visit she was having more good days and bad days but that ratio has changed some recently.  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              ROS: All systems reviewed are negative as they relate to the chief complaint within the history of present illness.  Patient denies  fevers or chills.   Assessment & Plan: Visit Diagnoses:  1. Primary osteoarthritis, left shoulder     Plan: Impression is left shoulder pain with history of prior surgery with well-maintained motion but significant arthritis to a moderate degree in the shoulder joint by MRI scanning.  Plan is divided injection again today into the subacromial space and glenohumeral joint.  20-month return for clinical recheck.  Follow-Up Instructions: No follow-ups on file.   Orders:  No orders of the defined types were placed in this encounter.  No orders of the defined types were placed in this encounter.     Procedures: Large Joint Inj: L glenohumeral on 04/24/2021 7:29 AM Indications: diagnostic evaluation and pain Details: 18 G 1.5 in needle, posterior approach  Arthrogram: No  Medications: 9 mL bupivacaine 0.5 %; 40 mg methylPREDNISolone acetate 40 MG/ML; 5 mL lidocaine 1 % Outcome: tolerated well, no immediate complications Procedure, treatment alternatives, risks and benefits  explained, specific risks discussed. Consent was given by the patient. Immediately prior to procedure a time out was called to verify the correct patient, procedure, equipment, support staff and site/side marked as required. Patient was prepped and draped in the usual sterile fashion.      Clinical Data: No additional findings.  Objective: Vital Signs: LMP  (LMP Unknown)   Physical Exam:   Constitutional: Patient appears well-developed HEENT:  Head: Normocephalic Eyes:EOM are normal Neck: Normal range of motion Cardiovascular: Normal rate Pulmonary/chest: Effort normal Neurologic: Patient is alert Skin: Skin is warm Psychiatric: Patient has normal mood and affect   Ortho Exam: Ortho exam demonstrates full active and passive range of motion of the cervical spine.  On the left-hand side her shoulder range of motion passively is 60/95/170.  Rotator cuff strength is good infraspinatus supraspinatus subscap muscle testing.  No discrete tenderness over the [facility] joint.  Motor or sensory function to the hand is intact.  Specialty Comments:  No specialty comments available.  Imaging: No results found.   PMFS History: Patient Active Problem List   Diagnosis Date Noted   Allergic rhinitis 09/12/2020   Allergic rhinitis due to animal (cat) (dog) hair and dander 09/12/2020   Mild intermittent asthma 09/12/2020   Vasomotor rhinitis 09/12/2020   Hyperlipidemia, mild 05/17/2020   Impingement syndrome of left shoulder 04/03/2020   Urge incontinence 11/05/2018   Diverticulosis of colon without  hemorrhage    Primary osteoarthritis of first carpometacarpal joint of left hand 06/22/2018   Chronic pain of left thumb 06/22/2018   History of retinal tear 10/20/2016   Cough 05/02/2016   Low back pain 09/28/2015   Insomnia 08/18/2013   OSA (obstructive sleep apnea) 07/31/2013   Thyroid nodule, cold 07/15/2012   Anxiety and depression 10/05/2007   Allergic rhinitis due to pollen 10/05/2007    Asthma 10/05/2007   Past Medical History:  Diagnosis Date   AC (acromioclavicular) joint bone spurs    left shoulder with chronic pain, w/limitation ROM   Allergy    weekly allergy shots   Anxiety    Arthritis    Asthma    Complication of anesthesia    difficult intubation   Depression    Fibrocystic breast disease    GERD (gastroesophageal reflux disease)    OSA (obstructive sleep apnea) 07/31/2013   Peptic ulcer disease    TMJ locking     Family History  Problem Relation Age of Onset   Osteoporosis Mother    Fibroids Mother    Other Mother        aortic valve replacement 1994, abdominal aortic aneurysm   Lung cancer Mother 52   Heart disease Father    Testicular cancer Father 48   Prostate cancer Father 93   Diabetes Maternal Grandmother    Heart disease Maternal Grandfather    Heart disease Paternal Grandmother    Diabetes Paternal Grandfather    Stomach cancer Paternal Grandfather    Breast cancer Paternal Aunt    Breast cancer Cousin    Colon cancer Neg Hx    Throat cancer Neg Hx    Pancreatic cancer Neg Hx     Past Surgical History:  Procedure Laterality Date   ABDOMINAL HYSTERECTOMY  2003   due to fibroids   ADENOIDECTOMY     x3   BIOPSY  06/29/2018   Procedure: BIOPSY;  Surgeon: Lavena Bullion, DO;  Location: [facility];  Service: Gastroenterology;;   BREAST CYST ASPIRATION     COLONOSCOPY  03/10/2007   results-normal   COLONOSCOPY WITH PROPOFOL N/A 06/29/2018   Procedure: COLONOSCOPY WITH PROPOFOL;  Surgeon: Lavena Bullion, DO;  Location: [facility];  Service: Gastroenterology;  Laterality: N/A;   HAMMER TOE SURGERY     POLYPECTOMY  06/29/2018   Procedure: POLYPECTOMY;  Surgeon: Lavena Bullion, DO;  Location: [facility];  Service: Gastroenterology;;   PUBOVAGINAL SLING  '90's   for incontinence   [place] Right 2013   [place]   ROTATOR CUFF REPAIR Left 2012   [place]   SHOULDER SURGERY      TONSILLECTOMY     x2   [place]   WRIST SURGERY     Social History   Occupational History   Not on file  Tobacco Use   Smoking status: Former    Packs/day: 2.00    Years: 15.00    Pack years: 30.00    Types: Cigarettes    Quit date: 11/19/1989    Years since quitting: 31.4   Smokeless tobacco: Never  Vaping Use   Vaping Use: Never used  Substance and Sexual Activity   Alcohol use: Yes    Comment: maybe a glass of wine on the weekend   Drug use: No   Sexual activity: Not on file

## 2021-05-16 ENCOUNTER — Telehealth: Payer: Self-pay | Admitting: Cardiology

## 2021-05-16 NOTE — Telephone Encounter (Signed)
A user error has taken place: encounter opened in error, closed for administrative reasons.

## 2021-05-17 ENCOUNTER — Encounter (HOSPITAL_BASED_OUTPATIENT_CLINIC_OR_DEPARTMENT_OTHER): Payer: Self-pay

## 2021-05-20 NOTE — Telephone Encounter (Signed)
An update for you!

## 2021-05-21 ENCOUNTER — Other Ambulatory Visit (HOSPITAL_COMMUNITY)
Admission: RE | Admit: 2021-05-21 | Discharge: 2021-05-21 | Disposition: A | Discharge status: Home / Self Care | Payer: Medicare Other | Source: Ambulatory Visit | Attending: Physician Assistant | Admitting: Physician Assistant

## 2021-05-21 ENCOUNTER — Encounter: Payer: Self-pay | Admitting: Physician Assistant

## 2021-05-21 ENCOUNTER — Other Ambulatory Visit: Payer: Self-pay

## 2021-05-21 ENCOUNTER — Ambulatory Visit (INDEPENDENT_AMBULATORY_CARE_PROVIDER_SITE_OTHER): Payer: Medicare Other | Admitting: Physician Assistant

## 2021-05-21 VITALS — BP 130/80 | HR 62 | Temp 98.0°F | Ht 68.0 in | Wt 184.5 lb

## 2021-05-21 DIAGNOSIS — R102 Pelvic and perineal pain unspecified side: Secondary | ICD-10-CM

## 2021-05-21 DIAGNOSIS — R300 Dysuria: Secondary | ICD-10-CM | POA: Diagnosis not present

## 2021-05-21 DIAGNOSIS — R040 Epistaxis: Secondary | ICD-10-CM

## 2021-05-21 LAB — POCT URINALYSIS DIPSTICK
Bilirubin, UA: NEGATIVE
Blood, UA: NEGATIVE
Glucose, UA: NEGATIVE
Ketones, UA: NEGATIVE
Leukocytes, UA: NEGATIVE
Nitrite, UA: NEGATIVE
Protein, UA: NEGATIVE
Spec Grav, UA: 1.015 (ref 1.010–1.025)
Urobilinogen, UA: 0.2 E.U./dL
pH, UA: 6 (ref 5.0–8.0)

## 2021-05-21 NOTE — Progress Notes (Signed)
Chelsey Jackson is a 71 y.o. female here for a UTI.  History of Present Illness:   Chief Complaint  Patient presents with   Urinary Tract Infection    Pt was seen at [facility] on Saturday for dysuria. She was treated with Cipro and Pyridium. Pt is concerned has had 2 UTI's in the past month and she is still having pain.   Epistaxis    Pt has had 2 nose bleeds in the past week. Lasting few minutes. She was using Saline Nasal Gel for 2 moths and nose bleeds stopped but now are back.    UTI On 05/06/21, Chelsey Jackson presented to [facility] with c/o dysuria that had been onset for two days. Associated sx were burning with urination, urinary urgency, and foul-smelling urine. Pt was found to have a UTI after urine culture showed growth of e.coli. Due to this she was prescribed macrobid 100 mg every 12 hours x 5 days. Although pt was compliant with medication and experienced relief, she had a reoccurence of sx a week later.   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 Upon wiping she noticed dark pink blood with no clots that was followed by pelvic pain. Due to this she immediately visited the [facility] for further evaluation. After further evaluation she was found to have yet another UTI and was prescribed Cipro 500 mg twice daily.   Although pt is compliant with taking cipro 500 mg twice daily and pyridium for additional relief, she still had concerns. Currently she is worried as to why she is having recurrent UTIs and would like to have this further evaluated. At this time she does admit that she is experiencing some pelvic pain and pain right below her kidneys. Pt does have a hx of kidney stones, about 30 years ago, and hysterectomy. Denies bowel changes, vaginal discharge, or concern for STD.   Epistaxis  Patient has been experiencing multiple nose bleeds for the past couple of months. States that epistaxis episodes began in the beginning of fall about once a  week from her left nostril only. In an effort to manage this, she began using OTC nasal saline gel, which she found beneficial. After using this product a few times a week for the next couple of months, she found in the middle of December that she hadn't had a nose bleed. Following this realization, she felt she could stop using the saline gel. Unfortunately, it wasn't until this past week, that her nose bleeds return. Currently she reports that she has had two this past week, lasting for a few minutes.   Denies fever, chills, lightheadedness, dizziness, or clots.   Past Medical History:  Diagnosis Date   AC (acromioclavicular) joint bone spurs    left shoulder with chronic pain, w/limitation ROM   Allergy    weekly allergy shots   Anxiety    Arthritis    Asthma    Complication of anesthesia    difficult intubation   Depression    Fibrocystic breast disease    GERD (gastroesophageal reflux disease)    OSA (obstructive sleep apnea) 07/31/2013   Peptic ulcer disease    TMJ locking      Social History   Tobacco Use   Smoking status: Former    Packs/day: 2.00    Years: 15.00    Pack years: 30.00    Types: Cigarettes    Quit date: 11/19/1989    Years  since quitting: 31.5   Smokeless tobacco: Never  Vaping Use   Vaping Use: Never used  Substance Use Topics   Alcohol use: Yes    Comment: maybe a glass of wine on the weekend   Drug use: No    Past Surgical History:  Procedure Laterality Date   ABDOMINAL HYSTERECTOMY  2003   due to fibroids   ADENOIDECTOMY     x3   BIOPSY  06/29/2018   Procedure: BIOPSY;  Surgeon: Lavena Bullion, DO;  Location: [facility];  Service: Gastroenterology;;   BREAST CYST ASPIRATION     COLONOSCOPY  03/10/2007   results-normal   COLONOSCOPY WITH PROPOFOL N/A 06/29/2018   Procedure: COLONOSCOPY WITH PROPOFOL;  Surgeon: Lavena Bullion, DO;  Location: [facility];  Service: Gastroenterology;  Laterality: N/A;   HAMMER  TOE SURGERY     POLYPECTOMY  06/29/2018   Procedure: POLYPECTOMY;  Surgeon: Lavena Bullion, DO;  Location: [facility];  Service: Gastroenterology;;   PUBOVAGINAL SLING  '90's   for incontinence   [place] Right 2013   [place]   ROTATOR CUFF REPAIR Left 2012   [place]   SHOULDER SURGERY     TONSILLECTOMY     x2   TUBAL LIGATION     TURBINATE REDUCTION  1997   WRIST SURGERY      Family History  Problem Relation Age of Onset   Osteoporosis Mother    Fibroids Mother    Other Mother        aortic valve replacement 1994, abdominal aortic aneurysm   Lung cancer Mother 73   Heart disease Father    Testicular cancer Father 62   Prostate cancer Father 56   Diabetes Maternal Grandmother    Heart disease Maternal Grandfather    Heart disease Paternal Grandmother    Diabetes Paternal Grandfather    Stomach cancer Paternal Grandfather    Breast cancer Paternal Aunt    Breast cancer Cousin    Colon cancer Neg Hx    Throat cancer Neg Hx    Pancreatic cancer Neg Hx     Allergies  Allergen Reactions   Penicillins Anaphylaxis    Stopped breathing REACTION: stop breathing    Current Medications:   Current Outpatient Medications:    albuterol (VENTOLIN HFA) 108 (90 Base) MCG/ACT inhaler, Inhale 1-2 puffs into the lungs every 6 (six) hours as needed for wheezing or shortness of breath., Disp: 18 g, Rfl: 2   buPROPion (WELLBUTRIN XL) 300 MG 24 hr tablet, Take 1 tablet (300 mg total) by mouth daily., Disp: 90 tablet, Rfl: 0   Calcium Carbonate-Vitamin D 600-400 MG-UNIT tablet, Take 1 tablet by mouth daily., Disp: , Rfl:    ciprofloxacin (CIPRO) 500 MG tablet, Take 500 mg by mouth 2 (two) times daily., Disp: , Rfl:    EPINEPHrine 0.3 mg/0.3 mL IJ SOAJ injection, Inject into the muscle once., Disp: , Rfl:    Iron-Vitamin C 65-125 MG TABS, Take 1 tablet by mouth daily., Disp: , Rfl:    levocetirizine (XYZAL) 5 MG  tablet, Take 5 mg by mouth every evening., Disp: , Rfl:    Multiple Vitamin (MULTIVITAMIN) tablet, Take 1 tablet by mouth daily., Disp: , Rfl:    scopolamine (TRANSDERM-SCOP) 1 MG/3DAYS, Place 1 patch (1.5 mg total) onto the skin every 3 (three) days., Disp: 10 patch, Rfl: 0   temazepam (RESTORIL) 30 MG capsule, Take 1 capsule (30 mg total) by mouth at  bedtime., Disp: 90 capsule, Rfl: 1   fluticasone (FLONASE) 50 MCG/ACT nasal spray, Place 2 sprays into both nostrils daily.  (Patient not taking: Reported on 05/21/2021), Disp: , Rfl:    rosuvastatin (CRESTOR) 5 MG tablet, Take 1 tablet (5 mg total) by mouth daily., Disp: 90 tablet, Rfl: 3  Current Facility-Administered Medications:    0.9 %  sodium chloride infusion, 500 mL, Intravenous, Once, [place], MD   Review of Systems:   Review of Systems  HENT:  Positive for nosebleeds.    Vitals:   Vitals:   05/21/21 1107  BP: 130/80  Pulse: 62  Temp: 98 F (36.7 C)  TempSrc: Temporal  SpO2: 97%  Weight: 184 lb 8 oz (83.7 kg)  Height: 5\' 8"  (1.727 m)     Body mass index is 28.05 kg/m.  Physical Exam:   Physical Exam Vitals and nursing note reviewed.  Constitutional:      General: She is not in acute distress.    Appearance: She is well-developed. She is not ill-appearing or toxic-appearing.  HENT:     Head: Normocephalic and atraumatic.     Right Ear: Tympanic membrane, ear canal and external ear normal. Tympanic membrane is not erythematous, retracted or bulging.     Left Ear: Tympanic membrane, ear canal and external ear normal. Tympanic membrane is not erythematous, retracted or bulging.     Nose: Nose normal.     Right Sinus: No maxillary sinus tenderness or frontal sinus tenderness.     Left Sinus: No maxillary sinus tenderness or frontal sinus tenderness.     Mouth/Throat:     Pharynx: Uvula midline. No posterior oropharyngeal erythema.  Eyes:     General: Lids are normal.     Conjunctiva/sclera:  Conjunctivae normal.  Neck:     Trachea: Trachea normal.  Cardiovascular:     Rate and Rhythm: Normal rate and regular rhythm.     Pulses: Normal pulses.     Heart sounds: Normal heart sounds, S1 normal and S2 normal.  Pulmonary:     Effort: Pulmonary effort is normal.     Breath sounds: Normal breath sounds. No decreased breath sounds, wheezing, rhonchi or rales.  Abdominal:     General: Abdomen is flat. Bowel sounds are normal.     Palpations: Abdomen is soft.     Tenderness: There is abdominal tenderness in the suprapubic area. There is no right CVA tenderness or left CVA tenderness.  Lymphadenopathy:     Cervical: No cervical adenopathy.  Skin:    General: Skin is warm and dry.  Neurological:     Mental Status: She is alert.     GCS: GCS eye subscore is 4. GCS verbal subscore is 5. GCS motor subscore is 6.  Psychiatric:        Speech: Speech normal.        Behavior: Behavior normal. Behavior is cooperative.   Results for orders placed or performed in visit on 05/21/21  POCT urinalysis dipstick  Result Value Ref Range   Color, UA yellow    Clarity, UA clear    Glucose, UA Negative Negative   Bilirubin, UA neg    Ketones, UA neg    Spec Grav, UA 1.015 1.010 - 1.025   Blood, UA neg    pH, UA 6.0 5.0 - 8.0   Protein, UA Negative Negative   Urobilinogen, UA 0.2 0.2 or 1.0 E.U./dL   Nitrite, UA neg    Leukocytes, UA Negative Negative  Appearance     Odor      Assessment and Plan:   Dysuria Urine today is normal I am going to send off her urine for culture I asked her to reach out to [facility] walk-in clinic to see if they performed a culture when she went Continue cipro 500 mg twice daily in the interim Hold use of pyridium or AZO immediately  Provided patient with UTI prevention instructions Informed patient that if new/worsening symptoms, or if symptoms do not completely resolve, to reach out and we will get a CT scan  Epistaxis No red flags Will refer to ENT for  further evaluation   I,Havlyn C Ratchford,acting as a scribe for [name], PA.,have documented all relevant documentation on the behalf of [name], PA,as directed by  [name], PA while in the presence of [name], [place].  I, [name], [place], have reviewed all documentation for this visit. The documentation on 05/21/21 for the exam, diagnosis, procedures, and orders are all accurate and complete.   [name], PA-C

## 2021-05-21 NOTE — Patient Instructions (Signed)
Plan for today: Referral for ENT   Continue antibiotic I will be in touch with OUR urine culture results and vaginal swab results  If not fully better by Friday, let me know and we will get a CT scan  No more pyridium or AZO  General instructions Make sure you: Pee until your bladder is empty. Do not hold pee for a long time. Empty your bladder after sex. Wipe from front to back after pooping if you are a female. Use each tissue one time when you wipe. Drink enough fluid to keep your pee pale yellow. Keep all follow-up visits as told by your doctor. This is important. Contact a doctor if: You do not get better after 1-2 days. Your symptoms go away and then come back. Get help right away if: You have very bad back pain. You have very bad pain in your lower belly. You have a fever. You are sick to your stomach (nauseous). You are throwing up.

## 2021-05-22 ENCOUNTER — Ambulatory Visit: Payer: Medicare Other | Admitting: Physician Assistant

## 2021-05-22 LAB — CERVICOVAGINAL ANCILLARY ONLY
Bacterial Vaginitis (gardnerella): NEGATIVE
Candida Glabrata: NEGATIVE
Candida Vaginitis: NEGATIVE
Comment: NEGATIVE
Comment: NEGATIVE
Comment: NEGATIVE

## 2021-05-23 LAB — URINE CULTURE
MICRO NUMBER:: 12943133
Result:: NO GROWTH
SPECIMEN QUALITY:: ADEQUATE

## 2021-05-24 ENCOUNTER — Other Ambulatory Visit: Payer: Self-pay | Admitting: Physician Assistant

## 2021-05-24 DIAGNOSIS — R102 Pelvic and perineal pain: Secondary | ICD-10-CM

## 2021-05-27 ENCOUNTER — Ambulatory Visit (HOSPITAL_BASED_OUTPATIENT_CLINIC_OR_DEPARTMENT_OTHER)
Admission: RE | Admit: 2021-05-27 | Discharge: 2021-05-27 | Disposition: A | Discharge status: Home / Self Care | Payer: Medicare Other | Source: Ambulatory Visit | Attending: Physician Assistant | Admitting: Physician Assistant

## 2021-05-27 ENCOUNTER — Encounter (HOSPITAL_BASED_OUTPATIENT_CLINIC_OR_DEPARTMENT_OTHER): Payer: Self-pay

## 2021-05-27 ENCOUNTER — Other Ambulatory Visit: Payer: Self-pay

## 2021-05-27 DIAGNOSIS — R102 Pelvic and perineal pain: Secondary | ICD-10-CM | POA: Insufficient documentation

## 2021-05-27 MED ORDER — IOHEXOL 300 MG/ML  SOLN
100.0000 mL | Freq: Once | INTRAMUSCULAR | Status: AC | PRN
  Administered 2021-05-27: 100 mL via INTRAVENOUS

## 2021-06-26 ENCOUNTER — Ambulatory Visit (INDEPENDENT_AMBULATORY_CARE_PROVIDER_SITE_OTHER): Payer: Medicare Other | Admitting: Family Medicine

## 2021-06-26 ENCOUNTER — Other Ambulatory Visit: Payer: Self-pay

## 2021-06-26 ENCOUNTER — Encounter: Payer: Self-pay | Admitting: Family Medicine

## 2021-06-26 VITALS — BP 122/76 | HR 70 | Temp 97.5°F | Ht 68.0 in | Wt 184.5 lb

## 2021-06-26 DIAGNOSIS — J069 Acute upper respiratory infection, unspecified: Secondary | ICD-10-CM

## 2021-06-26 DIAGNOSIS — R040 Epistaxis: Secondary | ICD-10-CM

## 2021-06-26 LAB — POC COVID19 BINAXNOW: SARS Coronavirus 2 Ag: NEGATIVE

## 2021-06-26 MED ORDER — AZITHROMYCIN 250 MG PO TABS: ORAL_TABLET | ORAL | 0 refills | Status: AC

## 2021-06-26 NOTE — Patient Instructions (Signed)
Meds have been sent the the pharmacy °You can take tylenol for pain/fevers °If worsening symptoms, let us know or go to the Emergency room  ° ° °

## 2021-06-26 NOTE — Progress Notes (Signed)
? ?Subjective:  ? ? ? Patient ID: Chelsey Jackson, female    DOB: 1951-01-20, 71 y.o.   MRN: [id number] ? ?Chief Complaint  ?Patient presents with  ? Nasal Congestion  ?  Green mucus ?Sx started 2 days ago ?  ? Epistaxis  ? Sinus Problem  ?  Taking allergy medications and saline nasal spray and nasal gel ?  ? ? ?HPI ?Chief complaint: congestion, epistaxis ?Symptom onset: 2 days ?Pertinent positives: congested, green mucus, face pressure.  Bloody nose-can spont bleed or when blows nose-for few wks-sees ENT in 10 days.   Has allergies/asthma-used to be on shots but off for 2 yrs. No flares.  Cough more from sinuses ?Pertinent negatives: no f/c ?Treatments tried: xyzal ?Vaccine status: has had 5 covid vaccines. ?Sick exposure: husb has covid (was in [place])-pt has been staying in different area of house ? ?There are no preventive care reminders to display for this patient. ? ?Past Medical History:  ?Diagnosis Date  ? AC (acromioclavicular) joint bone spurs   ? left shoulder with chronic pain, w/limitation ROM  ? Allergy   ? weekly allergy shots  ? Anxiety   ? Arthritis   ? Asthma   ? Complication of anesthesia   ? difficult intubation  ? Depression   ? Fibrocystic breast disease   ? GERD (gastroesophageal reflux disease)   ? OSA (obstructive sleep apnea) 07/31/2013  ? Peptic ulcer disease   ? TMJ locking   ? ? ?Past Surgical History:  ?Procedure Laterality Date  ? ABDOMINAL HYSTERECTOMY  2003  ? due to fibroids  ? ADENOIDECTOMY    ? x3  ? BIOPSY  06/29/2018  ? Procedure: BIOPSY;  Surgeon: Lavena Bullion, DO;  Location: [facility];  Service: Gastroenterology;;  ? BREAST CYST ASPIRATION    ? COLONOSCOPY  03/10/2007  ? results-normal  ? COLONOSCOPY WITH PROPOFOL N/A 06/29/2018  ? Procedure: COLONOSCOPY WITH PROPOFOL;  Surgeon: Lavena Bullion, DO;  Location: [facility];  Service: Gastroenterology;  Laterality: N/A;  ? HAMMER TOE SURGERY    ? POLYPECTOMY  06/29/2018  ? Procedure: POLYPECTOMY;  Surgeon: Lavena Bullion, DO;  Location: [facility];  Service: Gastroenterology;;  ? PUBOVAGINAL SLING  '90's  ? for incontinence  ? RHINOPLASTY  1984  ? ROTATOR CUFF REPAIR Right 2013  ? [place]  ? ROTATOR CUFF REPAIR Left 2012  ? [place]  ? SHOULDER SURGERY    ? TONSILLECTOMY    ? x2  ? TUBAL LIGATION    ? [place]  ? WRIST SURGERY    ? ? ?Outpatient Medications Prior to Visit  ?Medication Sig Dispense Refill  ? albuterol (VENTOLIN HFA) 108 (90 Base) MCG/ACT inhaler Inhale 1-2 puffs into the lungs every 6 (six) hours as needed for wheezing or shortness of breath. 18 g 2  ? buPROPion (WELLBUTRIN XL) 300 MG 24 hr tablet Take 1 tablet (300 mg total) by mouth daily. 90 tablet 0  ? Calcium Carbonate-Vitamin D 600-400 MG-UNIT tablet Take 1 tablet by mouth daily.    ? EPINEPHrine 0.3 mg/0.3 mL IJ SOAJ injection Inject into the muscle once.    ? fluticasone (FLONASE) 50 MCG/ACT nasal spray Place 2 sprays into both nostrils daily.    ? Iron-Vitamin C 65-125 MG TABS Take 1 tablet by mouth daily.    ? levocetirizine (XYZAL) 5 MG tablet Take 5 mg by mouth every evening.    ? Multiple Vitamin (MULTIVITAMIN) tablet Take 1 tablet by  mouth daily.    ? scopolamine (TRANSDERM-SCOP) 1 MG/3DAYS Place 1 patch (1.5 mg total) onto the skin every 3 (three) days. 10 patch 0  ? temazepam (RESTORIL) 30 MG capsule Take 1 capsule (30 mg total) by mouth at bedtime. 90 capsule 1  ? rosuvastatin (CRESTOR) 5 MG tablet Take 1 tablet (5 mg total) by mouth daily. 90 tablet 3  ? ciprofloxacin (CIPRO) 500 MG tablet Take 500 mg by mouth 2 (two) times daily.    ? ?Facility-Administered Medications Prior to Visit  ?Medication Dose Route Frequency Provider Last Rate Last Admin  ? 0.9 %  sodium chloride infusion  500 mL Intravenous Once Thornton Park, MD      ? ? ?Allergies  ?Allergen Reactions  ? Penicillins Anaphylaxis  ?  Stopped breathing ?REACTION: stop breathing  ? ?ROS neg/noncontributory except as noted HPI/below ? ? ?   ?Objective:  ?   ? ?BP 122/76   Pulse 70   Temp (!) 97.5 ?F (36.4 ?C) (Temporal)   Ht '5\' 8"'$  (1.727 m)   Wt 184 lb 8 oz (83.7 kg)   LMP  (LMP Unknown)   SpO2 97%   BMI 28.05 kg/m?  ?Wt Readings from Last 3 Encounters:  ?06/26/21 184 lb 8 oz (83.7 kg)  ?05/21/21 184 lb 8 oz (83.7 kg)  ?02/20/21 196 lb 3.2 oz (89 kg)  ? ? ?Physical Exam  ? ?Gen: WDWN NAD WF ?HEENT: NCAT, conjunctiva not injected, sclera nonicteric ?TM WNL B, OP moist, no exudates .  Some bleeding nares. No sinus tenderness ?NECK:  supple, no thyromegaly,some tender submand nodes, no carotid bruits ?CARDIAC: RRR, S1S2+, no murmur. DP 2+B ?LUNGS: CTAB. No wheezes ?EXT:  no edema ?MSK: no gross abnormalities.  ?NEURO: A&O x3.  CN II-XII intact.  ?PSYCH: normal mood. Good eye contact ? ?Results for orders placed or performed in visit on 06/26/21  ?[place] COVID-19  ?Result Value Ref Range  ? SARS Coronavirus 2 Ag Negative Negative  ?  ? ?   ?Assessment & Plan:  ? ?Problem List Items Addressed This Visit   ?None ?Visit Diagnoses   ? ? Upper respiratory tract infection, unspecified type    -  Primary  ? Relevant Medications  ? azithromycin (ZITHROMAX) 250 MG tablet  ? Frequent epistaxis      ? ?  ? URI-zpk to hold.  Cont saline.  Covid neg ?Epistaxis-has appt w/ENT next wk.  Cont saline and gel.  Afrin if uncontrollable bleeding ? ?Meds ordered this encounter  ?Medications  ? azithromycin (ZITHROMAX) 250 MG tablet  ?  Sig: Take 2 tablets on day 1, then 1 tablet daily on days 2 through 5  ?  Dispense:  6 tablet  ?  Refill:  0  ? ? ?Wellington Hampshire, MD ? ?

## 2021-07-18 ENCOUNTER — Ambulatory Visit (INDEPENDENT_AMBULATORY_CARE_PROVIDER_SITE_OTHER): Payer: Medicare Other

## 2021-07-18 DIAGNOSIS — Z0000 Encounter for general adult medical examination without abnormal findings: Secondary | ICD-10-CM | POA: Diagnosis not present

## 2021-07-18 NOTE — Patient Instructions (Signed)
Ms. Engelmann , ?Thank you for taking time to come for your Medicare Wellness Visit. I appreciate your ongoing commitment to your health goals. Please review the following plan we discussed and let me know if I can assist you in the future.  ? ?Screening recommendations/referrals: ?Colonoscopy: Done 06/29/18 ?Mammogram: Done 01/25/21 repeat every year  ?Bone Density: Done 01/28/19  ?Recommended yearly ophthalmology/optometry visit for glaucoma screening and checkup ?Recommended yearly dental visit for hygiene and checkup ? ?Vaccinations: ?Influenza vaccine: Done 01/29/21 repeat every year  ?Pneumococcal vaccine: Up to date ?Tdap vaccine: Done 07/25/16 repeat every 10 years  ?Shingles vaccine: Completed 8/16, 03/16/17   ?Covid-19:Completed 1/21, 2/11, 12/13/19 & 4/20, 04/02/21 ? ?Advanced directives: copies in chart  ? ?Conditions/risks identified: None at this time  ? ?Next appointment: Follow up in one year for your annual wellness visit  ? ? ?Preventive Care 4 Years and Older, Female ?Preventive care refers to lifestyle choices and visits with your health care provider that can promote health and wellness. ?What does preventive care include? ?A yearly physical exam. This is also called an annual well check. ?Dental exams once or twice a year. ?Routine eye exams. Ask your health care provider how often you should have your eyes checked. ?Personal lifestyle choices, including: ?Daily care of your teeth and gums. ?Regular physical activity. ?Eating a healthy diet. ?Avoiding tobacco and drug use. ?Limiting alcohol use. ?Practicing safe sex. ?Taking low-dose aspirin every day. ?Taking vitamin and mineral supplements as recommended by your health care provider. ?What happens during an annual well check? ?The services and screenings done by your health care provider during your annual well check will depend on your age, overall health, lifestyle risk factors, and family history of disease. ?Counseling  ?Your health care provider  may ask you questions about your: ?Alcohol use. ?Tobacco use. ?Drug use. ?Emotional well-being. ?Home and relationship well-being. ?Sexual activity. ?Eating habits. ?History of falls. ?Memory and ability to understand (cognition). ?Work and work Statistician. ?Reproductive health. ?Screening  ?You may have the following tests or measurements: ?Height, weight, and BMI. ?Blood pressure. ?Lipid and cholesterol levels. These may be checked every 5 years, or more frequently if you are over 97 years old. ?Skin check. ?Lung cancer screening. You may have this screening every year starting at age 42 if you have a 30-pack-year history of smoking and currently smoke or have quit within the past 15 years. ?Fecal occult blood test (FOBT) of the stool. You may have this test every year starting at age 5. ?Flexible sigmoidoscopy or colonoscopy. You may have a sigmoidoscopy every 5 years or a colonoscopy every 10 years starting at age 47. ?Hepatitis C blood test. ?Hepatitis B blood test. ?Sexually transmitted disease (STD) testing. ?Diabetes screening. This is done by checking your blood sugar (glucose) after you have not eaten for a while (fasting). You may have this done every 1-3 years. ?Bone density scan. This is done to screen for osteoporosis. You may have this done starting at age 32. ?Mammogram. This may be done every 1-2 years. Talk to your health care provider about how often you should have regular mammograms. ?Talk with your health care provider about your test results, treatment options, and if necessary, the need for more tests. ?Vaccines  ?Your health care provider may recommend certain vaccines, such as: ?Influenza vaccine. This is recommended every year. ?Tetanus, diphtheria, and acellular pertussis (Tdap, Td) vaccine. You may need a Td booster every 10 years. ?Zoster vaccine. You may need this after age  60. ?Pneumococcal 13-valent conjugate (PCV13) vaccine. One dose is recommended after age 75. ?Pneumococcal  polysaccharide (PPSV23) vaccine. One dose is recommended after age 46. ?Talk to your health care provider about which screenings and vaccines you need and how often you need them. ?This information is not intended to replace advice given to you by your health care provider. Make sure you discuss any questions you have with your health care provider. ?Document Released: 05/04/2015 Document Revised: 12/26/2015 Document Reviewed: 02/06/2015 ?Elsevier Interactive Patient Education ? 2017 [place]. ? ?Fall Prevention in the Home ?Falls can cause injuries. They can happen to people of all ages. There are many things you can do to make your home safe and to help prevent falls. ?What can I do on the outside of my home? ?Regularly fix the edges of walkways and driveways and fix any cracks. ?Remove anything that might make you trip as you walk through a door, such as a raised step or threshold. ?Trim any bushes or trees on the path to your home. ?Use bright outdoor lighting. ?Clear any walking paths of anything that might make someone trip, such as rocks or tools. ?Regularly check to see if handrails are loose or broken. Make sure that both sides of any steps have handrails. ?Any raised decks and porches should have guardrails on the edges. ?Have any leaves, snow, or ice cleared regularly. ?Use sand or salt on walking paths during winter. ?Clean up any spills in your garage right away. This includes oil or grease spills. ?What can I do in the bathroom? ?Use night lights. ?Install grab bars by the toilet and in the tub and shower. Do not use towel bars as grab bars. ?Use non-skid mats or decals in the tub or shower. ?If you need to sit down in the shower, use a plastic, non-slip stool. ?Keep the floor dry. Clean up any water that spills on the floor as soon as it happens. ?Remove soap buildup in the tub or shower regularly. ?Attach bath mats securely with double-sided non-slip rug tape. ?Do not have throw rugs and other  things on the floor that can make you trip. ?What can I do in the bedroom? ?Use night lights. ?Make sure that you have a light by your bed that is easy to reach. ?Do not use any sheets or blankets that are too big for your bed. They should not hang down onto the floor. ?Have a firm chair that has side arms. You can use this for support while you get dressed. ?Do not have throw rugs and other things on the floor that can make you trip. ?What can I do in the kitchen? ?Clean up any spills right away. ?Avoid walking on wet floors. ?Keep items that you use a lot in easy-to-reach places. ?If you need to reach something above you, use a strong step stool that has a grab bar. ?Keep electrical cords out of the way. ?Do not use floor polish or wax that makes floors slippery. If you must use wax, use non-skid floor wax. ?Do not have throw rugs and other things on the floor that can make you trip. ?What can I do with my stairs? ?Do not leave any items on the stairs. ?Make sure that there are handrails on both sides of the stairs and use them. Fix handrails that are broken or loose. Make sure that handrails are as long as the stairways. ?Check any carpeting to make sure that it is firmly attached to the stairs. Fix  any carpet that is loose or worn. ?Avoid having throw rugs at the top or bottom of the stairs. If you do have throw rugs, attach them to the floor with carpet tape. ?Make sure that you have a light switch at the top of the stairs and the bottom of the stairs. If you do not have them, ask someone to add them for you. ?What else can I do to help prevent falls? ?Wear shoes that: ?Do not have high heels. ?Have rubber bottoms. ?Are comfortable and fit you well. ?Are closed at the toe. Do not wear sandals. ?If you use a stepladder: ?Make sure that it is fully opened. Do not climb a closed stepladder. ?Make sure that both sides of the stepladder are locked into place. ?Ask someone to hold it for you, if possible. ?Clearly  mark and make sure that you can see: ?Any grab bars or handrails. ?First and last steps. ?Where the edge of each step is. ?Use tools that help you move around (mobility aids) if they are needed. These includ

## 2021-07-18 NOTE — Progress Notes (Signed)
Virtual Visit via Telephone Note ? ?I connected with  Chelsey Jackson on 07/18/21 at 10:00 AM EDT by telephone and verified that I am speaking with the correct person using two identifiers. ? ?Medicare Annual Wellness visit completed telephonically due to Covid-19 pandemic.  ? ?Persons participating in this call: This Health Coach and this patient.  ? ?Location: ?Patient: Home ?Provider: Office ?  ?I discussed the limitations, risks, security and privacy concerns of performing an evaluation and management service by telephone and the availability of in person appointments. The patient expressed understanding and agreed to proceed. ? ?Unable to perform video visit due to video visit attempted and failed and/or patient does not have video capability.  ? ?Some vital signs may be absent or patient reported.  ? ?Willette Brace, LPN ? ? ?Subjective:  ? Chelsey Jackson is a 71 y.o. female who presents for Medicare Annual (Subsequent) preventive examination. ? ?Review of Systems    ? ?Cardiac Risk Factors include: advanced age (>40mn, >>69women);dyslipidemia ? ?   ?Objective:  ?  ?There were no vitals filed for this visit. ?There is no height or weight on file to calculate BMI. ? ? ?  07/18/2021  ? 10:08 AM 05/23/2020  ?  9:54 PM 06/29/2018  ? 10:50 AM 04/28/2018  ?  1:59 PM 05/14/2017  ?  4:30 PM 09/20/2014  ?  8:56 AM 10/11/2013  ?  8:03 PM  ?Advanced Directives  ?Does Patient Have a Medical Advance Directive? Yes No Yes No No Yes Patient has advance directive, copy not in chart  ?Type of AParamedicof ASouth RangeLiving will   Living will HStaplesLiving will  ?Does patient want to make changes to medical advance directive?       No change requested  ?Copy of HGrayin Chart? Yes - validated most recent copy scanned in chart (See row information)  No - copy requested      ?Would patient like information on creating a medical advance  directive?  No - Patient declined  No - Patient declined No - Patient declined    ? ? ?Current Medications (verified) ?Outpatient Encounter Medications as of 07/18/2021  ?Medication Sig  ? albuterol (VENTOLIN HFA) 108 (90 Base) MCG/ACT inhaler Inhale 1-2 puffs into the lungs every 6 (six) hours as needed for wheezing or shortness of breath.  ? buPROPion (WELLBUTRIN XL) 300 MG 24 hr tablet Take 1 tablet (300 mg total) by mouth daily.  ? Calcium Carbonate-Vitamin D 600-400 MG-UNIT tablet Take 1 tablet by mouth daily.  ? EPINEPHrine 0.3 mg/0.3 mL IJ SOAJ injection Inject into the muscle once.  ? fluticasone (FLONASE) 50 MCG/ACT nasal spray Place 2 sprays into both nostrils daily.  ? Iron-Vitamin C 65-125 MG TABS Take 1 tablet by mouth daily.  ? levocetirizine (XYZAL) 5 MG tablet Take 5 mg by mouth every evening.  ? Multiple Vitamin (MULTIVITAMIN) tablet Take 1 tablet by mouth daily.  ? scopolamine (TRANSDERM-SCOP) 1 MG/3DAYS Place 1 patch (1.5 mg total) onto the skin every 3 (three) days.  ? temazepam (RESTORIL) 30 MG capsule Take 1 capsule (30 mg total) by mouth at bedtime.  ? rosuvastatin (CRESTOR) 5 MG tablet Take 1 tablet (5 mg total) by mouth daily.  ? ?Facility-Administered Encounter Medications as of 07/18/2021  ?Medication  ? 0.9 %  sodium chloride infusion  ? ? ?Allergies (verified) ?Penicillins  ? ?History: ?Past Medical History:  ?  Diagnosis Date  ? AC (acromioclavicular) joint bone spurs   ? left shoulder with chronic pain, w/limitation ROM  ? Allergy   ? weekly allergy shots  ? Anxiety   ? Arthritis   ? Asthma   ? Complication of anesthesia   ? difficult intubation  ? Depression   ? Fibrocystic breast disease   ? GERD (gastroesophageal reflux disease)   ? OSA (obstructive sleep apnea) 07/31/2013  ? Peptic ulcer disease   ? TMJ locking   ? ?Past Surgical History:  ?Procedure Laterality Date  ? ABDOMINAL HYSTERECTOMY  2003  ? due to fibroids  ? ADENOIDECTOMY    ? x3  ? BIOPSY  06/29/2018  ? Procedure: BIOPSY;   Surgeon: Lavena Bullion, DO;  Location: [facility];  Service: Gastroenterology;;  ? BREAST CYST ASPIRATION    ? COLONOSCOPY  03/10/2007  ? results-normal  ? COLONOSCOPY WITH PROPOFOL N/A 06/29/2018  ? Procedure: COLONOSCOPY WITH PROPOFOL;  Surgeon: Lavena Bullion, DO;  Location: [facility];  Service: Gastroenterology;  Laterality: N/A;  ? HAMMER TOE SURGERY    ? POLYPECTOMY  06/29/2018  ? Procedure: POLYPECTOMY;  Surgeon: Lavena Bullion, DO;  Location: [facility];  Service: Gastroenterology;;  ? PUBOVAGINAL SLING  '90's  ? for incontinence  ? RHINOPLASTY  1984  ? ROTATOR CUFF REPAIR Right 2013  ? [place]  ? ROTATOR CUFF REPAIR Left 2012  ? [place]  ? SHOULDER SURGERY    ? TONSILLECTOMY    ? x2  ? TUBAL LIGATION    ? [place]  ? WRIST SURGERY    ? ?Family History  ?Problem Relation Age of Onset  ? Osteoporosis Mother   ? Fibroids Mother   ? Other Mother   ?     aortic valve replacement 1994, abdominal aortic aneurysm  ? Lung cancer Mother 3  ? Heart disease Father   ? Testicular cancer Father 17  ? Prostate cancer Father 20  ? Diabetes Maternal Grandmother   ? Heart disease Maternal Grandfather   ? Heart disease Paternal Grandmother   ? Diabetes Paternal Grandfather   ? Stomach cancer Paternal Grandfather   ? Breast cancer Paternal Aunt   ? Breast cancer Cousin   ? Colon cancer Neg Hx   ? Throat cancer Neg Hx   ? Pancreatic cancer Neg Hx   ? ?Social History  ? ?Socioeconomic History  ? Marital status: Married  ?  Spouse name: Not on file  ? Number of children: Not on file  ? Years of education: Not on file  ? Highest education level: Not on file  ?Occupational History  ? Not on file  ?Tobacco Use  ? Smoking status: Former  ?  Packs/day: 2.00  ?  Years: 15.00  ?  Pack years: 30.00  ?  Types: Cigarettes  ?  Quit date: 11/19/1989  ?  Years since quitting: 31.6  ? Smokeless tobacco: Never  ?Vaping Use  ? Vaping Use: Never used  ?Substance and Sexual Activity  ? Alcohol use: Yes  ?   Comment: maybe a glass of wine on the weekend  ? Drug use: No  ? Sexual activity: Not on file  ?Other Topics Concern  ? Not on file  ?Social History Narrative  ? [employer] 2 years  ? 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 ?Social Determinants of Health  ? ?Financial Resource Strain: Low Risk   ? Difficulty of Paying Living  Expenses: Not hard at all  ?Food Insecurity: No Food Insecurity  ? Worried About Charity fundraiser in the Last Year: Never true  ? Ran Out of Food in the Last Year: Never true  ?Transportation Needs: No Transportation Needs  ? Lack of Transportation (Medical): No  ? Lack of Transportation (Non-Medical): No  ?Physical Activity: Sufficiently Active  ? Days of Exercise per Week: 3 days  ? Minutes of Exercise per Session: 60 min  ?Stress: No Stress Concern Present  ? Feeling of Stress : Not at all  ?Social Connections: Socially Integrated  ? Frequency of Communication with Friends and Family: More than three times a week  ? Frequency of Social Gatherings with Friends and Family: More than three times a week  ? Attends Religious Services: 1 to 4 times per year  ? Active Member of Clubs or Organizations: Yes  ? Attends Archivist Meetings: 1 to 4 times per year  ? Marital Status: Married  ? ? ?Tobacco Counseling ?Counseling given: Not Answered ? ? ?Clinical Intake: ? ?Pre-visit preparation completed: Yes ? ?Pain : No/denies pain ? ?  ? ?BMI - recorded: 28.06 ?Nutritional Status: BMI 25 -29 Overweight ?Nutritional Risks: None ?Diabetes: No ? ?How often do you need to have someone help you when you read instructions, pamphlets, or other written materials from your doctor or pharmacy?: 1 - Never ? ?Diabetic?No ? ?Interpreter Needed?: No ? ?Information entered by :: Charlott Rakes, LPN ? ? ?Activities of Daily Living ? ?  07/18/2021  ? 10:09 AM  ?In your present state of health, do you have any difficulty performing the following activities:  ?Hearing?  0  ?Vision? 0  ?Difficulty concentrating or making decisions? 0  ?Walking or climbing stairs? 0  ?Dressing or bathing? 0  ?Doing errands, shopping? 0  ?Preparing Food and eating ? N  ?Using the Toilet? N

## 2021-07-22 ENCOUNTER — Other Ambulatory Visit: Payer: Self-pay | Admitting: Physician Assistant

## 2021-07-30 ENCOUNTER — Encounter: Payer: Self-pay | Admitting: Physician Assistant

## 2021-08-01 ENCOUNTER — Ambulatory Visit (INDEPENDENT_AMBULATORY_CARE_PROVIDER_SITE_OTHER): Payer: Medicare Other | Admitting: Physician Assistant

## 2021-08-01 VITALS — BP 124/78 | HR 64 | Temp 98.6°F | Ht 68.0 in | Wt 185.2 lb

## 2021-08-01 DIAGNOSIS — N390 Urinary tract infection, site not specified: Secondary | ICD-10-CM

## 2021-08-01 DIAGNOSIS — F5101 Primary insomnia: Secondary | ICD-10-CM

## 2021-08-01 DIAGNOSIS — F32A Depression, unspecified: Secondary | ICD-10-CM

## 2021-08-01 DIAGNOSIS — T7840XA Allergy, unspecified, initial encounter: Secondary | ICD-10-CM

## 2021-08-01 DIAGNOSIS — F419 Anxiety disorder, unspecified: Secondary | ICD-10-CM

## 2021-08-01 DIAGNOSIS — J4520 Mild intermittent asthma, uncomplicated: Secondary | ICD-10-CM

## 2021-08-01 MED ORDER — METHYLPREDNISOLONE ACETATE 80 MG/ML IJ SUSP
80.0000 mg | Freq: Once | INTRAMUSCULAR | Status: AC
  Administered 2021-08-01: 80 mg via INTRAMUSCULAR

## 2021-08-01 MED ORDER — TEMAZEPAM 30 MG PO CAPS: 30.0000 mg | ORAL_CAPSULE | Freq: Every day | ORAL | 1 refills | Status: DC

## 2021-08-01 MED ORDER — MONTELUKAST SODIUM 10 MG PO TABS: 10.0000 mg | ORAL_TABLET | Freq: Every day | ORAL | 1 refills | Status: DC

## 2021-08-01 MED ORDER — NITROFURANTOIN MONOHYD MACRO 100 MG PO CAPS: 100.0000 mg | ORAL_CAPSULE | Freq: Two times a day (BID) | ORAL | 0 refills | Status: DC

## 2021-08-01 NOTE — Progress Notes (Signed)
Chelsey Jackson is a 71 y.o. female here for a new problem. ? ?History of Present Illness:  ? ?Chief Complaint  ?Patient presents with  ? Follow-up  ?  Pt in for 6 mon f/u; pt c/o cold/allergy symptoms since Monday night, two negative covid tests, nose feels swollen, cough, congestion.  ? ? ?HPI ? ?Allergies and Asthma ?Complains of nasal swelling. Saw ENT 2 weeks ago and had some cauterization done. Monday night started having some productive cough. Tuesday stayed on couch and hydrated. Feels  ? ?Takes: xyzal, flonase. Was on singulair at one point. ? ?Was doing allergy shots up until 2 years ago. Had been doing them for 10 years. Sees Dr. Fredderick Phenix -- 02/07/21 last visit.  ? ?Currently has albuterol inhaler on hand. Not taking regularly, has not needed it. ? ? ?Insomnia ?Currently taking restoril 30 mg daily. Tolerating well. Needs refill early for upcoming cruise to [place]. ? ? ?Recurrent UTI ?Concerned about getting possible UTI on cruise. Asking for medication just incase. Has responded well to macrobid in the past. Denies current sx. ? ? ?Anxiety and Depression ?Currently taking Wellbutrin 300 mg XL daily. Doing well. Denies SI/HI. Excited about upcoming cruise. ? ?Past Medical History:  ?Diagnosis Date  ? AC (acromioclavicular) joint bone spurs   ? left shoulder with chronic pain, w/limitation ROM  ? Allergy   ? weekly allergy shots  ? Anxiety   ? Arthritis   ? Asthma   ? Complication of anesthesia   ? difficult intubation  ? Depression   ? Fibrocystic breast disease   ? GERD (gastroesophageal reflux disease)   ? OSA (obstructive sleep apnea) 07/31/2013  ? Peptic ulcer disease   ? TMJ locking   ? ?  ?Social History  ? ?Tobacco Use  ? Smoking status: Former  ?  Packs/day: 2.00  ?  Years: 15.00  ?  Pack years: 30.00  ?  Types: Cigarettes  ?  Quit date: 11/19/1989  ?  Years since quitting: 31.7  ? Smokeless tobacco: Never  ?Vaping Use  ? Vaping Use: Never used  ?Substance Use Topics  ? Alcohol use: Yes  ?  Comment:  maybe a glass of wine on the weekend  ? Drug use: No  ? ? ?Past Surgical History:  ?Procedure Laterality Date  ? ABDOMINAL HYSTERECTOMY  2003  ? due to fibroids  ? ADENOIDECTOMY    ? x3  ? BIOPSY  06/29/2018  ? Procedure: BIOPSY;  Surgeon: Lavena Bullion, DO;  Location: [facility];  Service: Gastroenterology;;  ? BREAST CYST ASPIRATION    ? COLONOSCOPY  03/10/2007  ? results-normal  ? COLONOSCOPY WITH PROPOFOL N/A 06/29/2018  ? Procedure: COLONOSCOPY WITH PROPOFOL;  Surgeon: Lavena Bullion, DO;  Location: [facility];  Service: Gastroenterology;  Laterality: N/A;  ? HAMMER TOE SURGERY    ? POLYPECTOMY  06/29/2018  ? Procedure: POLYPECTOMY;  Surgeon: Lavena Bullion, DO;  Location: [facility];  Service: Gastroenterology;;  ? PUBOVAGINAL SLING  '90's  ? for incontinence  ? RHINOPLASTY  1984  ? ROTATOR CUFF REPAIR Right 2013  ? [place]  ? ROTATOR CUFF REPAIR Left 2012  ? [facility]  ? SHOULDER SURGERY    ? TONSILLECTOMY    ? x2  ? TUBAL LIGATION    ? [place]  ? WRIST SURGERY    ? ? ?Family History  ?Problem Relation Age of Onset  ? Osteoporosis Mother   ? Fibroids Mother   ?  Other Mother   ?     aortic valve replacement 1994, abdominal aortic aneurysm  ? Lung cancer Mother 27  ? Heart disease Father   ? Testicular cancer Father 32  ? Prostate cancer Father 13  ? Diabetes Maternal Grandmother   ? Heart disease Maternal Grandfather   ? Heart disease Paternal Grandmother   ? Diabetes Paternal Grandfather   ? Stomach cancer Paternal Grandfather   ? Breast cancer Paternal Aunt   ? Breast cancer Cousin   ? Colon cancer Neg Hx   ? Throat cancer Neg Hx   ? Pancreatic cancer Neg Hx   ? ? ?Allergies  ?Allergen Reactions  ? Penicillins Anaphylaxis  ?  Stopped breathing ?REACTION: stop breathing  ? ? ?Current Medications:  ? ?Current Outpatient Medications:  ?  albuterol (VENTOLIN HFA) 108 (90 Base) MCG/ACT inhaler, Inhale 1-2 puffs into the lungs every 6 (six) hours as needed for wheezing or  shortness of breath., Disp: 18 g, Rfl: 2 ?  buPROPion (WELLBUTRIN XL) 300 MG 24 hr tablet, TAKE ONE TABLET BY MOUTH DAILY, Disp: 90 tablet, Rfl: 0 ?  Calcium Carbonate-Vitamin D 600-400 MG-UNIT tablet, Take 1 tablet by mouth daily., Disp: , Rfl:  ?  EPINEPHrine 0.3 mg/0.3 mL IJ SOAJ injection, Inject into the muscle once., Disp: , Rfl:  ?  fluticasone (FLONASE) 50 MCG/ACT nasal spray, Place 2 sprays into both nostrils daily., Disp: , Rfl:  ?  Iron-Vitamin C 65-125 MG TABS, Take 1 tablet by mouth daily., Disp: , Rfl:  ?  levocetirizine (XYZAL) 5 MG tablet, Take 5 mg by mouth every evening., Disp: , Rfl:  ?  Multiple Vitamin (MULTIVITAMIN) tablet, Take 1 tablet by mouth daily., Disp: , Rfl:  ?  scopolamine (TRANSDERM-SCOP) 1 MG/3DAYS, Place 1 patch (1.5 mg total) onto the skin every 3 (three) days., Disp: 10 patch, Rfl: 0 ?  temazepam (RESTORIL) 30 MG capsule, Take 1 capsule (30 mg total) by mouth at bedtime., Disp: 90 capsule, Rfl: 1 ?  rosuvastatin (CRESTOR) 5 MG tablet, Take 1 tablet (5 mg total) by mouth daily., Disp: 90 tablet, Rfl: 3 ? ?Current Facility-Administered Medications:  ?  0.9 %  sodium chloride infusion, 500 mL, Intravenous, Once, [place], MD  ? ?Review of Systems:  ? ?ROS ?Negative unless otherwise specified per HPI. ? ? ?Vitals:  ? ?Vitals:  ? 08/01/21 0901  ?BP: 124/78  ?Pulse: 64  ?Temp: 98.6 ?F (37 ?C)  ?TempSrc: Temporal  ?SpO2: 98%  ?Weight: 185 lb 3.2 oz (84 kg)  ?Height: '5\' 8"'$  (1.727 m)  ?   ?Body mass index is 28.16 kg/m?. ? ?Physical Exam:  ? ?Physical Exam ?Vitals and nursing note reviewed.  ?Constitutional:   ?   General: She is not in acute distress. ?   Appearance: She is well-developed. She is not ill-appearing or toxic-appearing.  ?HENT:  ?   Head: Normocephalic and atraumatic.  ?   Right Ear: Tympanic membrane, ear canal and external ear normal. Tympanic membrane is not erythematous, retracted or bulging.  ?   Left Ear: Tympanic membrane, ear canal and external ear normal.  Tympanic membrane is not erythematous, retracted or bulging.  ?   Nose: Nose normal.  ?   Right Turbinates: Swollen.  ?   Left Turbinates: Swollen.  ?   Right Sinus: No maxillary sinus tenderness or frontal sinus tenderness.  ?   Left Sinus: No maxillary sinus tenderness or frontal sinus tenderness.  ?   Mouth/Throat:  ?  Pharynx: Uvula midline. No posterior oropharyngeal erythema.  ?Eyes:  ?   General: Lids are normal.  ?   Conjunctiva/sclera: Conjunctivae normal.  ?Neck:  ?   Trachea: Trachea normal.  ?Cardiovascular:  ?   Rate and Rhythm: Normal rate and regular rhythm.  ?   Heart sounds: Normal heart sounds, S1 normal and S2 normal.  ?Pulmonary:  ?   Effort: Pulmonary effort is normal.  ?   Breath sounds: Normal breath sounds. No decreased breath sounds, wheezing, rhonchi or rales.  ?Lymphadenopathy:  ?   Cervical: No cervical adenopathy.  ?Skin: ?   General: Skin is warm and dry.  ?Neurological:  ?   Mental Status: She is alert.  ?Psychiatric:     ?   Speech: Speech normal.     ?   Behavior: Behavior normal. Behavior is cooperative.  ? ? ?Assessment and Plan:  ? ?Allergy, initial encounter ?Uncontrolled ?80 mg depo-medrol injection given today ?Restart 10 mg singulair ?Continue xyzal, consider increase to 10 mg if needed ?Follow-up with allergy if sx persist or worsen ? ?Primary insomnia ?Well controlled ?PDMP reviewed, no red flags ?Refill Restoril 30 mg nightly ?Follow-up in 6 months, sooner if concerns ? ?Anxiety and depression ?Well controlled ?Continue Wellbutrin 300 mg xl ?Denies SI/HI ?Follow-up in 6 months, sooner if concerns ? ?Mild intermittent asthma without complication ?Well controlled ?Continue prn albuterol ?Follow-up as needed ? ?Recurrent UTI ?Will send macrobid 100 mg BID x 5 days to pharmacy for her to take on cruise should she need it ? ?[name], PA-C ?

## 2021-08-01 NOTE — Patient Instructions (Signed)
It was great to see you! ? ?Start singulair 10 mg nightly x 1 week. ?Consider doubling xyzal if this does not help you. ?Steroid injection given today. ? ?Macrobid/Nitrofurantoin rx sent in for UTI should you need it on your cruise. ? ?Pick up your Temazepam on Friday 4/21 -- if you have any issues -- call our office. ? ?Take care, ? ?[name] PA-C  ?

## 2021-08-30 ENCOUNTER — Encounter: Payer: Self-pay | Admitting: Physician Assistant

## 2021-09-02 NOTE — Telephone Encounter (Signed)
FYI

## 2021-09-04 ENCOUNTER — Ambulatory Visit: Payer: Medicare Other | Admitting: Orthopedic Surgery

## 2021-09-09 ENCOUNTER — Encounter: Payer: Self-pay | Admitting: Gastroenterology

## 2021-09-18 ENCOUNTER — Ambulatory Visit: Payer: Medicare Other

## 2021-09-18 ENCOUNTER — Ambulatory Visit (INDEPENDENT_AMBULATORY_CARE_PROVIDER_SITE_OTHER): Payer: Medicare Other | Admitting: Surgical

## 2021-09-18 ENCOUNTER — Encounter: Payer: Self-pay | Admitting: Orthopedic Surgery

## 2021-09-18 DIAGNOSIS — M75102 Unspecified rotator cuff tear or rupture of left shoulder, not specified as traumatic: Secondary | ICD-10-CM

## 2021-09-18 DIAGNOSIS — G8929 Other chronic pain: Secondary | ICD-10-CM

## 2021-09-18 DIAGNOSIS — M19012 Primary osteoarthritis, left shoulder: Secondary | ICD-10-CM

## 2021-09-18 MED ORDER — LIDOCAINE HCL 1 % IJ SOLN
5.0000 mL | INTRAMUSCULAR | Status: AC | PRN
  Administered 2021-09-18: 5 mL

## 2021-09-18 MED ORDER — BUPIVACAINE HCL 0.5 % IJ SOLN
9.0000 mL | INTRAMUSCULAR | Status: AC | PRN
  Administered 2021-09-18: 9 mL via INTRA_ARTICULAR

## 2021-09-18 MED ORDER — METHYLPREDNISOLONE ACETATE 40 MG/ML IJ SUSP
40.0000 mg | INTRAMUSCULAR | Status: AC | PRN
  Administered 2021-09-18: 40 mg via INTRA_ARTICULAR

## 2021-09-18 NOTE — Progress Notes (Signed)
Office Visit Note   Patient: Chelsey Jackson           Date of Birth: Sep 04, 1950           MRN: [id number] Visit Date: 09/18/2021 Requested by: [name], [address] [place],  [place] [id number] PCP: [name], [place]  Subjective: Chief Complaint  Patient presents with   Left Shoulder - Follow-up    HPI: Chelsey Jackson is a 71 y.o. female who presents to the office complaining of left shoulder pain.  Patient had divided subacromial and glenohumeral injection on 04/24/2021 that gave her excellent relief until 6 weeks ago when she ran into a clothing rack at a store and has had increased pain since then.  She is right-hand dominant.  She is retired.  She enjoys quilting in her free time.  She has history of prior rotator cuff tear repair in both shoulders by Dr. Durward Fortes about 10 years ago.  She would like to hold off on surgery if at all possible as she is having good relief from these injections.  No diabetes.  No blood thinner use.  No smoking..                ROS: All systems reviewed are negative as they relate to the chief complaint within the history of present illness.  Patient denies fevers or chills.  Assessment & Plan: Visit Diagnoses:  1. Chronic left shoulder pain   2. Primary osteoarthritis, left shoulder   3. Tear of left supraspinatus tendon     Plan: Patient is a 71 year old female who presents for evaluation of left shoulder pain.  She has history of rotator cuff tear and left shoulder glenohumeral osteoarthritis that is moderate.  This was identified on MRI of the left shoulder from 2022.  She has had no relief from prior subacromial injection but she has had excellent lasting relief for 4 to 5 months at a time from divided glenohumeral/subacromial injections.  Discussed the surgical options for the shoulder which include bicep tenodesis/rotator cuff repair versus reverse shoulder arthroplasty.  She would like to hold off on surgery if at all  possible as she is having good relief from these injections.  After discussion, plan for ultrasound-guided left glenohumeral injection today as the glenohumeral arthritis seems to be the main driver behind her pain.  Under ultrasound guidance, glenohumeral injection was administered and injection was seen infiltrating the glenohumeral joint.  No significant resistance during administration of the injection.  Patient tolerated the procedure well.  She will follow-up in another 4 to 5 months for repeat injection.  Recommended she reach out to the office if this injection does not provide good relief and if that is the case then plan to convert back to divided subacromial/glenohumeral injections instead of solely glenohumeral injection.  Follow-Up Instructions: No follow-ups on file.   Orders:  Orders Placed This Encounter  Procedures   US Guided Needle Placement - No Linked Charges   No orders of the defined types were placed in this encounter.     Procedures: Large Joint Inj: L glenohumeral on 09/18/2021 4:47 PM Indications: diagnostic evaluation and pain Details: 18 G 3.5 in needle, ultrasound-guided posterior approach  Arthrogram: No  Medications: 9 mL bupivacaine 0.5 %; 40 mg methylPREDNISolone acetate 40 MG/ML; 5 mL lidocaine 1 % Outcome: tolerated well, no immediate complications Procedure, treatment alternatives, risks and benefits explained, specific risks discussed. Consent was given by the patient. Immediately prior to procedure a  time out was called to verify the correct patient, procedure, equipment, support staff and site/side marked as required. Patient was prepped and draped in the usual sterile fashion.      Clinical Data: No additional findings.  Objective: Vital Signs: LMP  (LMP Unknown)   Physical Exam:  Constitutional: Patient appears well-developed HEENT:  Head: Normocephalic Eyes:EOM are normal Neck: Normal range of motion Cardiovascular: Normal  rate Pulmonary/chest: Effort normal Neurologic: Patient is alert Skin: Skin is warm Psychiatric: Patient has normal mood and affect  Ortho Exam: Ortho exam demonstrates left shoulder with 50 degrees external rotation, 80 degrees abduction, 160 degrees forward flexion.  Mild tenderness over the bicipital groove.  No tenderness over the [facility] joint.  Negative O'Brien sign.  5/5 motor strength of bilateral grip strength, finger abduction, pronation/supination, bicep, tricep, deltoid.  5 -/5 external rotation strength.  5/5 supra and subscap strength.  Axillary nerve is intact with deltoid firing.  Specialty Comments:  No specialty comments available.  Imaging: No results found.   PMFS History: Patient Active Problem List   Diagnosis Date Noted   Allergic rhinitis 09/12/2020   Allergic rhinitis due to animal (cat) (dog) hair and dander 09/12/2020   Mild intermittent asthma 09/12/2020   Vasomotor rhinitis 09/12/2020   Hyperlipidemia, mild 05/17/2020   Impingement syndrome of left shoulder 04/03/2020   Urge incontinence 11/05/2018   Diverticulosis of colon without hemorrhage    Primary osteoarthritis of first carpometacarpal joint of left hand 06/22/2018   Chronic pain of left thumb 06/22/2018   History of retinal tear 10/20/2016   Cough 05/02/2016   Low back pain 09/28/2015   Insomnia 08/18/2013   OSA (obstructive sleep apnea) 07/31/2013   Thyroid nodule, cold 07/15/2012   Anxiety and depression 10/05/2007   Allergic rhinitis due to pollen 10/05/2007   Asthma 10/05/2007   Past Medical History:  Diagnosis Date   AC (acromioclavicular) joint bone spurs    left shoulder with chronic pain, w/limitation ROM   Allergy    weekly allergy shots   Anxiety    Arthritis    Asthma    Complication of anesthesia    difficult intubation   Depression    Fibrocystic breast disease    GERD (gastroesophageal reflux disease)    OSA (obstructive sleep apnea) 07/31/2013   Peptic ulcer disease     TMJ locking     Family History  Problem Relation Age of Onset   Osteoporosis Mother    Fibroids Mother    Other Mother        aortic valve replacement 1994, abdominal aortic aneurysm   Lung cancer Mother 3   Heart disease Father    Testicular cancer Father 28   Prostate cancer Father 56   Diabetes Maternal Grandmother    Heart disease Maternal Grandfather    Heart disease Paternal Grandmother    Diabetes Paternal Grandfather    Stomach cancer Paternal Grandfather    Breast cancer Paternal Aunt    Breast cancer Cousin    Colon cancer Neg Hx    Throat cancer Neg Hx    Pancreatic cancer Neg Hx     Past Surgical History:  Procedure Laterality Date   ABDOMINAL HYSTERECTOMY  2003   due to fibroids   ADENOIDECTOMY     x3   BIOPSY  06/29/2018   Procedure: BIOPSY;  Surgeon: Lavena Bullion, DO;  Location: [facility];  Service: Gastroenterology;;   BREAST CYST ASPIRATION     COLONOSCOPY  03/10/2007  results-normal   COLONOSCOPY WITH PROPOFOL N/A 06/29/2018   Procedure: COLONOSCOPY WITH PROPOFOL;  Surgeon: Lavena Bullion, DO;  Location: [facility];  Service: Gastroenterology;  Laterality: N/A;   HAMMER TOE SURGERY     POLYPECTOMY  06/29/2018   Procedure: POLYPECTOMY;  Surgeon: Lavena Bullion, DO;  Location: [facility];  Service: Gastroenterology;;   PUBOVAGINAL SLING  '90's   for incontinence   [place] Right 2013   [place]   ROTATOR CUFF REPAIR Left 2012   [place]   SHOULDER SURGERY     TONSILLECTOMY     x2   [place]   WRIST SURGERY     Social History   Occupational History   Not on file  Tobacco Use   Smoking status: Former    Packs/day: 2.00    Years: 15.00    Pack years: 30.00    Types: Cigarettes    Quit date: 11/19/1989    Years since quitting: 31.8   Smokeless tobacco: Never  Vaping Use   Vaping Use: Never used  Substance and Sexual Activity   Alcohol use: Yes     Comment: maybe a glass of wine on the weekend   Drug use: No   Sexual activity: Not on file

## 2021-10-03 ENCOUNTER — Telehealth (INDEPENDENT_AMBULATORY_CARE_PROVIDER_SITE_OTHER): Payer: Medicare Other | Admitting: Family Medicine

## 2021-10-03 DIAGNOSIS — R059 Cough, unspecified: Secondary | ICD-10-CM | POA: Diagnosis not present

## 2021-10-03 DIAGNOSIS — R0981 Nasal congestion: Secondary | ICD-10-CM

## 2021-10-03 DIAGNOSIS — H5789 Other specified disorders of eye and adnexa: Secondary | ICD-10-CM

## 2021-10-03 MED ORDER — DOXYCYCLINE HYCLATE 100 MG PO TABS: 100.0000 mg | ORAL_TABLET | Freq: Two times a day (BID) | ORAL | 0 refills | Status: DC

## 2021-10-03 MED ORDER — ERYTHROMYCIN 5 MG/GM OP OINT: 1.0000 | TOPICAL_OINTMENT | Freq: Every day | OPHTHALMIC | 0 refills | Status: DC

## 2021-10-03 NOTE — Progress Notes (Signed)
Virtual Visit via Video Note  I connected with Chelsey Jackson  on 10/03/21 at 10:20 AM EDT by a video enabled telemedicine application and verified that I am speaking with the correct person using two identifiers.  Location patient: [place] Location provider:work or home office Persons participating in the virtual visit: patient, provider  I discussed the limitations and requested verbal permission for telemedicine visit. The patient expressed understanding and agreed to proceed.   HPI:  Acute telemedicine visit for L eye pinkeye and sinus issues: -Onset: this morning for the eye, 1 month ago for the sinus issues -had covid 1 month ago and has had ongoing nasal congestion, now with sinus discomfort, bad cough with now thick discolored mucus -today L eye irritated, red, mucky - this white muck -Denies:fevers, malaise, pain in the eye, vision changes, foreign body in the eye or trauma that she is aware of, known sick contact -Has tried:nothing -Pertinent past medical history: see below -Pertinent medication allergies: Allergies  Allergen Reactions   Penicillins Anaphylaxis    Stopped breathing REACTION: stop breathing  -COVID-19 vaccine status:  Immunization History  Administered Date(s) Administered   Fluad Quad(high Dose 65+) 01/11/2020, 01/29/2021   Influenza Whole 01/20/2008, 02/02/2009, 01/10/2010   Influenza, High Dose Seasonal PF 02/28/2016, 02/18/2017, 02/24/2018, 12/24/2018, 06/20/2019, 06/19/2020   Influenza,inj,Quad PF,6+ Mos 01/06/2015, 01/08/2016, 01/19/2017   Influenza,inj,quad, With Preservative 01/06/2015   Influenza-Unspecified 01/20/2012, 01/06/2013, 01/16/2014, 01/01/2018, 12/24/2018   PFIZER(Purple Top)SARS-COV-2 Vaccination 05/12/2019, 06/02/2019, 12/13/2019, 08/08/2020   Pfizer Covid-19 Vaccine Bivalent Booster 74yr & up 04/02/2021   Pneumococcal Conjugate-13 11/05/2018   Pneumococcal Polysaccharide-23 07/29/2013, 02/28/2016, 02/18/2017, 02/24/2018, 06/20/2019,  01/11/2020, 06/19/2020   Td 03/26/2009   Tdap 07/25/2016   Zoster Recombinat (Shingrix) 12/04/2016, 03/16/2017   Zoster, Live 08/11/2008     ROS: See pertinent positives and negatives per HPI.  Past Medical History:  Diagnosis Date   AC (acromioclavicular) joint bone spurs    left shoulder with chronic pain, w/limitation ROM   Allergy    weekly allergy shots   Anxiety    Arthritis    Asthma    Complication of anesthesia    difficult intubation   Depression    Fibrocystic breast disease    GERD (gastroesophageal reflux disease)    OSA (obstructive sleep apnea) 07/31/2013   Peptic ulcer disease    TMJ locking     Past Surgical History:  Procedure Laterality Date   ABDOMINAL HYSTERECTOMY  2003   due to fibroids   ADENOIDECTOMY     x3   BIOPSY  06/29/2018   Procedure: BIOPSY;  Surgeon: CLavena Bullion DO;  Location: [facility];  Service: Gastroenterology;;   BREAST CYST ASPIRATION     COLONOSCOPY  03/10/2007   results-normal   COLONOSCOPY WITH PROPOFOL N/A 06/29/2018   Procedure: COLONOSCOPY WITH PROPOFOL;  Surgeon: CLavena Bullion DO;  Location: [facility];  Service: Gastroenterology;  Laterality: N/A;   HAMMER TOE SURGERY     POLYPECTOMY  06/29/2018   Procedure: POLYPECTOMY;  Surgeon: CLavena Bullion DO;  Location[facility];  Service: Gastroenterology;;   PUBOVAGINAL SLING  '90's   for incontinence   [place]   ROTATOR CUFF REPAIR Left 2012   [place]   SHOULDER SURGERY     TONSILLECTOMY     x2   [place]  WRIST SURGERY       Current Outpatient Medications:  doxycycline (VIBRA-TABS) 100 MG tablet, Take 1 tablet (100 mg total) by mouth 2 (two) times daily., Disp: 14 tablet, Rfl: 0   erythromycin ophthalmic ointment, Place 1 Application into the left eye at bedtime., Disp: 3.5 g, Rfl: 0   albuterol (VENTOLIN HFA) 108 (90 Base) MCG/ACT inhaler,  Inhale 1-2 puffs into the lungs every 6 (six) hours as needed for wheezing or shortness of breath., Disp: 18 g, Rfl: 2   buPROPion (WELLBUTRIN XL) 300 MG 24 hr tablet, TAKE ONE TABLET BY MOUTH DAILY, Disp: 90 tablet, Rfl: 0   Calcium Carbonate-Vitamin D 600-400 MG-UNIT tablet, Take 1 tablet by mouth daily., Disp: , Rfl:    EPINEPHrine 0.3 mg/0.3 mL IJ SOAJ injection, Inject into the muscle once., Disp: , Rfl:    fluticasone (FLONASE) 50 MCG/ACT nasal spray, Place 2 sprays into both nostrils daily., Disp: , Rfl:    Iron-Vitamin C 65-125 MG TABS, Take 1 tablet by mouth daily., Disp: , Rfl:    levocetirizine (XYZAL) 5 MG tablet, Take 5 mg by mouth every evening., Disp: , Rfl:    montelukast (SINGULAIR) 10 MG tablet, Take 1 tablet (10 mg total) by mouth at bedtime., Disp: 90 tablet, Rfl: 1   Multiple Vitamin (MULTIVITAMIN) tablet, Take 1 tablet by mouth daily., Disp: , Rfl:    nitrofurantoin, macrocrystal-monohydrate, (MACROBID) 100 MG capsule, Take 1 capsule (100 mg total) by mouth 2 (two) times daily., Disp: 10 capsule, Rfl: 0   rosuvastatin (CRESTOR) 5 MG tablet, Take 1 tablet (5 mg total) by mouth daily., Disp: 90 tablet, Rfl: 3   scopolamine (TRANSDERM-SCOP) 1 MG/3DAYS, Place 1 patch (1.5 mg total) onto the skin every 3 (three) days., Disp: 10 patch, Rfl: 0   temazepam (RESTORIL) 30 MG capsule, Take 1 capsule (30 mg total) by mouth at bedtime., Disp: 90 capsule, Rfl: 1  Current Facility-Administered Medications:    0.9 %  sodium chloride infusion, 500 mL, Intravenous, Once, [place], MD  EXAM:  VITALS per patient if applicable:  GENERAL: alert, oriented, appears well and in no acute distress  HEENT: atraumatic, conjunttiva erythematous L with some white drainage EOMI, no signs of foreign body on gross exam over video - explained limited exam, no appreciable periocular swelling, PER, no obvious abnormalities on inspection of external nose and ears  NECK: normal movements of the head  and neck  LUNGS: on inspection no signs of respiratory distress, breathing rate appears normal, no obvious gross SOB, gasping or wheezing  CV: no obvious cyanosis  MS: moves all visible extremities without noticeable abnormality  PSYCH/NEURO: pleasant and cooperative, no obvious depression or anxiety, speech and thought processing grossly intact  ASSESSMENT AND PLAN:  Discussed the following assessment and plan:  Nasal congestion  Cough, unspecified type  Eye irritation  Eye drainage  -we discussed possible serious and likely etiologies, options for evaluation and workup, limitations of telemedicine visit vs in person visit, treatment, treatment risks and precautions. Pt is agreeable to treatment via telemedicine at this moment. Query 2ndary bacterial sinusitis and possible conjunctivitis vs other. She has opted for compresses, erythro oint for eye, nasal saline rinses and initiation of doxy for the sinuses if worsening or not improving.  Advised to seek prompt  in person care if worsening, new symptoms arise, or if is not improving with treatment as expected per our conversation of expected course. Discussed options for follow up care. Did let this patient know that I do telemedicine on Tuesdays and Thursdays for [place] and those  are the days I am logged into the system. Advised to schedule follow up visit with PCP, [place] virtual visits or [facility] if any further questions or concerns to avoid delays in care.   I discussed the assessment and treatment plan with the patient. The patient was provided an opportunity to ask questions and all were answered. The patient agreed with the plan and demonstrated an understanding of the instructions.     Lucretia Kern, DO

## 2021-10-03 NOTE — Patient Instructions (Signed)
-  I sent the medication(s) we discussed to your pharmacy: Meds ordered this encounter  Medications   erythromycin ophthalmic ointment    Sig: Place 1 Application into the left eye at bedtime.    Dispense:  3.5 g    Refill:  0   doxycycline (VIBRA-TABS) 100 MG tablet    Sig: Take 1 tablet (100 mg total) by mouth 2 (two) times daily.    Dispense:  14 tablet    Refill:  0     I hope you are feeling better soon!  Seek in person care promptly if your symptoms worsen, new concerns arise or you are not improving with treatment.  It was nice to meet you today. I help [facility] out with telemedicine visits on Tuesdays and Thursdays and am happy to help if you need a virtual follow up visit on those days. Otherwise, if you have any concerns or questions following this visit please schedule a follow up visit with your Primary Care office or seek care at a local urgent care clinic to avoid delays in care. If you are having severe or life threatening symptoms please call 911 and/or go to the nearest emergency room.

## 2021-10-09 ENCOUNTER — Encounter: Payer: Self-pay | Admitting: Family Medicine

## 2021-10-16 ENCOUNTER — Other Ambulatory Visit: Payer: Self-pay | Admitting: Physician Assistant

## 2021-10-18 ENCOUNTER — Other Ambulatory Visit (HOSPITAL_BASED_OUTPATIENT_CLINIC_OR_DEPARTMENT_OTHER): Payer: Self-pay | Admitting: Cardiology

## 2021-10-18 NOTE — Telephone Encounter (Signed)
Rx request sent to pharmacy.  

## 2021-11-11 ENCOUNTER — Ambulatory Visit (INDEPENDENT_AMBULATORY_CARE_PROVIDER_SITE_OTHER): Payer: Medicare Other | Admitting: Family

## 2021-11-11 ENCOUNTER — Encounter: Payer: Self-pay | Admitting: Family

## 2021-11-11 VITALS — BP 147/79 | HR 67 | Temp 97.7°F | Ht 68.0 in | Wt 183.5 lb

## 2021-11-11 DIAGNOSIS — H65112 Acute and subacute allergic otitis media (mucoid) (sanguinous) (serous), left ear: Secondary | ICD-10-CM | POA: Diagnosis not present

## 2021-11-11 MED ORDER — AZITHROMYCIN 250 MG PO TABS: ORAL_TABLET | ORAL | 0 refills | Status: AC

## 2021-11-11 NOTE — Progress Notes (Signed)
Patient ID: Chelsey Jackson, female    DOB: 1951/02/25, 71 y.o.   MRN: [id number]  Chief Complaint  Patient presents with   swollen neck    Pt c/o swollen place on left side of neck, Noticed yesterday. Pt tender to touch and hurts, uncomfortable and hard for pt to swallow. It was extremely swollen yesterday traveling back home from vacation.     HPI: Swollen neck:  left side, pt noticed yesterday, reports pain around left ear, hurts to chew, denies sinus drg or pain, but is on several allergy meds she takes daily. Reports mild daily nasal drainage. She reports she was around a friend with 3 cats and she is allergic which aggravated her sx for a while, pt also reports swimming every day on vacation just recently.  Assessment & Plan:  1. Acute allergic mucoid otitis media of left ear TM w/mucoid effusion and injection, very swollen left cervical lymph node. Allergy to PCN, sending Azithromycin, advised on use & SE, may take up to a month for lymph node to completely subside, but other sx should be clear after abt is finished, call the office if no improvement.  Subjective:    Outpatient Medications Prior to Visit  Medication Sig Dispense Refill   albuterol (VENTOLIN HFA) 108 (90 Base) MCG/ACT inhaler Inhale 1-2 puffs into the lungs every 6 (six) hours as needed for wheezing or shortness of breath. 18 g 2   buPROPion (WELLBUTRIN XL) 300 MG 24 hr tablet TAKE ONE TABLET BY MOUTH DAILY 90 tablet 0   Calcium Carbonate-Vitamin D 600-400 MG-UNIT tablet Take 1 tablet by mouth daily.     EPINEPHrine 0.3 mg/0.3 mL IJ SOAJ injection Inject into the muscle once.     fluticasone (FLONASE) 50 MCG/ACT nasal spray Place 2 sprays into both nostrils daily.     Iron-Vitamin C 65-125 MG TABS Take 1 tablet by mouth daily.     levocetirizine (XYZAL) 5 MG tablet Take 5 mg by mouth every evening.     Multiple Vitamin (MULTIVITAMIN) tablet Take 1 tablet by mouth daily.     rosuvastatin (CRESTOR) 5 MG tablet TAKE  ONE TABLET BY MOUTH DAILY 90 tablet 1   temazepam (RESTORIL) 30 MG capsule Take 1 capsule (30 mg total) by mouth at bedtime. 90 capsule 1   doxycycline (VIBRA-TABS) 100 MG tablet Take 1 tablet (100 mg total) by mouth 2 (two) times daily. 14 tablet 0   erythromycin ophthalmic ointment Place 1 Application into the left eye at bedtime. 3.5 g 0   montelukast (SINGULAIR) 10 MG tablet Take 1 tablet (10 mg total) by mouth at bedtime. 90 tablet 1   nitrofurantoin, macrocrystal-monohydrate, (MACROBID) 100 MG capsule Take 1 capsule (100 mg total) by mouth 2 (two) times daily. 10 capsule 0   scopolamine (TRANSDERM-SCOP) 1 MG/3DAYS Place 1 patch (1.5 mg total) onto the skin every 3 (three) days. 10 patch 0   Facility-Administered Medications Prior to Visit  Medication Dose Route Frequency Provider Last Rate Last Admin   0.9 %  sodium chloride infusion  500 mL Intravenous Once [place], MD       Past Medical History:  Diagnosis Date   AC (acromioclavicular) joint bone spurs    left shoulder with chronic pain, w/limitation ROM   Allergy    weekly allergy shots   Anxiety    Arthritis    Asthma    Complication of anesthesia    difficult intubation   Depression    Fibrocystic  breast disease    GERD (gastroesophageal reflux disease)    OSA (obstructive sleep apnea) 07/31/2013   Peptic ulcer disease    TMJ locking    Past Surgical History:  Procedure Laterality Date   ABDOMINAL HYSTERECTOMY  2003   due to fibroids   ADENOIDECTOMY     x3   BIOPSY  06/29/2018   Procedure: BIOPSY;  Surgeon: Lavena Bullion, DO;  [facility]: [facility];  Service: Gastroenterology;;   BREAST CYST ASPIRATION     COLONOSCOPY  03/10/2007   results-normal   COLONOSCOPY WITH PROPOFOL N/A 06/29/2018   Procedure: COLONOSCOPY WITH PROPOFOL;  Surgeon: Lavena Bullion, DO;  [facility]: [facility];  Service: Gastroenterology;  Laterality: N/A;   HAMMER TOE SURGERY     POLYPECTOMY  06/29/2018   Procedure:  POLYPECTOMY;  Surgeon: Lavena Bullion, DO;  [facility];  Service: Gastroenterology;;   PUBOVAGINAL SLING  '90's   for incontinence   [place] Right 2013   [place]   ROTATOR CUFF REPAIR Left 2012   [place]   SHOULDER SURGERY     TONSILLECTOMY     x2   TUBAL LIGATION     TURBINATE REDUCTION  1997   WRIST SURGERY     Allergies  Allergen Reactions   Penicillins Anaphylaxis    Stopped breathing REACTION: stop breathing      Objective:    Physical Exam Vitals and nursing note reviewed.  Constitutional:      Appearance: Normal appearance.  HENT:     Head: Right periorbital erythema (mild) and left periorbital erythema (mild) present.     Right Ear: Tympanic membrane and ear canal normal.     Left Ear: Ear canal normal. A middle ear effusion is present. Tympanic membrane is injected. Tympanic membrane is not perforated, erythematous, retracted or bulging.     Nose:     Right Sinus: No maxillary sinus tenderness or frontal sinus tenderness.     Left Sinus: Maxillary sinus tenderness present. No frontal sinus tenderness.     Mouth/Throat:     Mouth: Mucous membranes are moist.     Pharynx: No pharyngeal swelling or oropharyngeal exudate.  Cardiovascular:     Rate and Rhythm: Normal rate and regular rhythm.  Pulmonary:     Effort: Pulmonary effort is normal.     Breath sounds: Normal breath sounds.  Musculoskeletal:        General: Normal range of motion.  Lymphadenopathy:     Cervical: Cervical adenopathy present.     Right cervical: No superficial cervical adenopathy.    Left cervical: Superficial cervical adenopathy present.  Skin:    General: Skin is warm and dry.  Neurological:     Mental Status: She is alert.  Psychiatric:        Mood and Affect: Mood normal.        Behavior: Behavior normal.    BP (!) 147/79 (BP Location: Left Arm, Patient Position: Sitting, Cuff Size: Large)   Pulse 67   Temp 97.7 F (36.5 C)  (Temporal)   Ht '5\' 8"'$  (1.727 m)   Wt 183 lb 8 oz (83.2 kg)   LMP  (LMP Unknown)   SpO2 97%   BMI 27.90 kg/m  Wt Readings from Last 3 Encounters:  11/11/21 183 lb 8 oz (83.2 kg)  08/01/21 185 lb 3.2 oz (84 kg)  06/26/21 184 lb 8 oz (83.7 kg)       Sirius Woodford,  NP

## 2021-11-15 ENCOUNTER — Ambulatory Visit (HOSPITAL_BASED_OUTPATIENT_CLINIC_OR_DEPARTMENT_OTHER): Payer: Medicare Other | Admitting: Cardiology

## 2021-11-18 ENCOUNTER — Encounter: Payer: Self-pay | Admitting: Family

## 2021-11-29 ENCOUNTER — Other Ambulatory Visit (HOSPITAL_BASED_OUTPATIENT_CLINIC_OR_DEPARTMENT_OTHER): Payer: Self-pay | Admitting: Cardiology

## 2021-11-30 LAB — LIPID PANEL
Chol/HDL Ratio: 3.7 ratio (ref 0.0–4.4)
Cholesterol, Total: 148 mg/dL (ref 100–199)
HDL: 40 mg/dL (ref >39)
LDL Chol Calc (NIH): 91 mg/dL (ref 0–99)
Triglycerides: 87 mg/dL (ref 0–149)
VLDL Cholesterol Cal: 17 mg/dL (ref 5–40)

## 2021-12-04 ENCOUNTER — Ambulatory Visit (INDEPENDENT_AMBULATORY_CARE_PROVIDER_SITE_OTHER): Payer: Medicare Other | Admitting: Cardiology

## 2021-12-04 ENCOUNTER — Encounter (HOSPITAL_BASED_OUTPATIENT_CLINIC_OR_DEPARTMENT_OTHER): Payer: Self-pay | Admitting: Cardiology

## 2021-12-04 VITALS — BP 126/80 | HR 67 | Ht 68.0 in | Wt 183.6 lb

## 2021-12-04 DIAGNOSIS — Z7189 Other specified counseling: Secondary | ICD-10-CM | POA: Diagnosis not present

## 2021-12-04 DIAGNOSIS — G4733 Obstructive sleep apnea (adult) (pediatric): Secondary | ICD-10-CM

## 2021-12-04 DIAGNOSIS — E785 Hyperlipidemia, unspecified: Secondary | ICD-10-CM

## 2021-12-04 DIAGNOSIS — Z8249 Family history of ischemic heart disease and other diseases of the circulatory system: Secondary | ICD-10-CM

## 2021-12-04 NOTE — Patient Instructions (Signed)

## 2021-12-04 NOTE — Progress Notes (Signed)
Cardiology Office Note:    Date:  12/04/2021   ID:  Chelsey Jackson, DOB Mar 18, 1951, MRN [id number]  PCP:  Chelsey Coke, PA  Cardiologist:  Chelsey Dresser, MD  Referring MD: Chelsey Jackson, [place]   CC: follow up  History of Present Illness:    Chelsey Jackson is a 71 y.o. female with a hx of OSA on CPAP who is seen for follow up. She was initially seen as a new consult at the request of Chelsey Jackson, [place] for the evaluation and management of family history of cardiovascular disease.  Cardiovascular risk factors: Prior clinical ASCVD: none Comorbid conditions, including hypertension, hyperlipidemia, diabetes, chronic kidney disease: borderline lipids Metabolic syndrome/Obesity: highest adult weight 200 lbs Chronic inflammatory conditions: none Tobacco use history: former, quit 12/11/1989 (on her birthday) Family history: father had MI, first age late 60s/early 50s. He had survived metastatic testicular cancer in his 27s. Had two more stents put in age late 70s/early 56s, diagnosed with afib at that time. Deceased age 39. Mother had a fever when she was young, told she had MVP but had AVR in her early-mid 24s, passed away from lung cancer age 73. 2 siblings, sister and brother. Brother is 66 years younger, had nuclear stress test that was normal, started on a statin. Prior cardiac testing and/or incidental findings on other testing (ie coronary calcium): none Exercise level: deep water aerobics 3x/week for the last several months, walks with her husband on occasion (heat limits her) Current diet: "everything". Chicken, fish, fruits/veg. Fried food rarely (special occasions).  At her last appointment, she was evaluated for her family history of heart disease and reviewed her CV risk. She was started on pravastatin 20 mg daily.   Today: She states she is feeling good with no new concerns. She complains of ecchymosis of her LUE, which was caused last Friday when she had blood  drawn.  She completes water aerobics 3 times a week at the [facility].  Regarding her diet she usually eats fish or chicken; every now and then a hamburger. She may have 1-2 glasses of wine a week.  At night she is using an oral appliance instead of a CPAP. She is sleeping well and feeling rested.  She denies any palpitations, chest pain, shortness of breath, or peripheral edema. No lightheadedness, headaches, syncope, orthopnea, or PND.   Past Medical History:  Diagnosis Date   AC (acromioclavicular) joint bone spurs    left shoulder with chronic pain, w/limitation ROM   Allergy    weekly allergy shots   Anxiety    Arthritis    Asthma    Complication of anesthesia    difficult intubation   Depression    Fibrocystic breast disease    GERD (gastroesophageal reflux disease)    OSA (obstructive sleep apnea) 07/31/2013   Peptic ulcer disease    TMJ locking     Past Surgical History:  Procedure Laterality Date   ABDOMINAL HYSTERECTOMY  2003   due to fibroids   ADENOIDECTOMY     x3   BIOPSY  06/29/2018   Procedure: BIOPSY;  Surgeon: Lavena Bullion, DO;  Location: [facility];  Service: Gastroenterology;;   BREAST CYST ASPIRATION     COLONOSCOPY  03/10/2007   results-normal   COLONOSCOPY WITH PROPOFOL N/A 06/29/2018   Procedure: COLONOSCOPY WITH PROPOFOL;  Surgeon: Lavena Bullion, DO;  Location: [facility];  Service: Gastroenterology;  Laterality: N/A;   HAMMER TOE SURGERY     POLYPECTOMY  06/29/2018  Procedure: POLYPECTOMY;  Surgeon: Lavena Bullion, DO;  Location: [facility];  Service: Gastroenterology;;   PUBOVAGINAL SLING  '90's   for incontinence   [place] Right 2013   [place]   ROTATOR CUFF REPAIR Left 2012   [place]   SHOULDER SURGERY     TONSILLECTOMY     x2   [place]   WRIST SURGERY      Current Medications: Current Outpatient Medications on File Prior to Visit  Medication  Sig   albuterol (VENTOLIN HFA) 108 (90 Base) MCG/ACT inhaler Inhale 1-2 puffs into the lungs every 6 (six) hours as needed for wheezing or shortness of breath.   augmented betamethasone dipropionate (DIPROLENE-AF) 0.05 % cream Apply topically as needed.   buPROPion (WELLBUTRIN XL) 300 MG 24 hr tablet TAKE ONE TABLET BY MOUTH DAILY   Calcium Carbonate-Vitamin D 600-400 MG-UNIT tablet Take 1 tablet by mouth daily.   EPINEPHrine 0.3 mg/0.3 mL IJ SOAJ injection Inject into the muscle once.   fluticasone (FLONASE) 50 MCG/ACT nasal spray Place 2 sprays into both nostrils daily.   ipratropium (ATROVENT) 0.03 % nasal spray Place 2 sprays into both nostrils daily at 12 noon.   Iron-Vitamin C 65-125 MG TABS Take 1 tablet by mouth daily.   levocetirizine (XYZAL) 5 MG tablet Take 5 mg by mouth every evening.   Multiple Vitamin (MULTIVITAMIN) tablet Take 1 tablet by mouth daily.   rosuvastatin (CRESTOR) 5 MG tablet TAKE ONE TABLET BY MOUTH DAILY   temazepam (RESTORIL) 30 MG capsule Take 1 capsule (30 mg total) by mouth at bedtime.   Current Facility-Administered Medications on File Prior to Visit  Medication   0.9 %  sodium chloride infusion     Allergies:   Penicillins   Social History   Tobacco Use   Smoking status: Former    Packs/day: 2.00    Years: 15.00    Total pack years: 30.00    Types: Cigarettes    Quit date: 11/19/1989    Years since quitting: 32.0   Smokeless tobacco: Never  Vaping Use   Vaping Use: Never used  Substance Use Topics   Alcohol use: Yes    Comment: maybe a glass of wine on the weekend   Drug use: No    Family History: family history includes Breast cancer in her cousin and paternal aunt; Diabetes in her maternal grandmother and paternal grandfather; Fibroids in her mother; Heart disease in her father, maternal grandfather, and paternal grandmother; Lung cancer (age of onset: 18) in her mother; Osteoporosis in her mother; Other in her mother; Prostate cancer (age  of onset: 16) in her father; Stomach cancer in her paternal grandfather; Testicular cancer (age of onset: 26) in her father. There is no history of Colon cancer, Throat cancer, or Pancreatic cancer.  ROS:   Please see the history of present illness. All other systems are reviewed and negative.     EKGs/Labs/Other Studies Reviewed:    The following studies were reviewed today:  CT Abdomen Pelvis 05/27/21: IMPRESSION: 1. No acute intra-abdominal process. 2. Aortic Atherosclerosis (ICD10-I70.0).  EKG:  EKG is personally reviewed.  12/04/2021: NST at 67 bpm, borderline low voltage precordial leads 09/20/2020: NSR at 71 bpm.  Recent Labs: 12/20/2020: ALT 24  Recent Lipid Panel    Component Value Date/Time   CHOL 148 11/29/2021 0846   TRIG 87 11/29/2021 0846   HDL 40 11/29/2021 0846  CHOLHDL 3.7 11/29/2021 0846   CHOLHDL 5 05/21/2020 0838   VLDL 25.6 05/21/2020 0838   LDLCALC 91 11/29/2021 0846    Physical Exam:    VS:  BP 126/80 (BP Location: Left Arm, Patient Position: Sitting, Cuff Size: Normal)   Pulse 67   Ht '5\' 8"'$  (1.727 m)   Wt 183 lb 9.6 oz (83.3 kg)   LMP  (LMP Unknown)   BMI 27.92 kg/m     Wt Readings from Last 3 Encounters:  12/04/21 183 lb 9.6 oz (83.3 kg)  11/11/21 183 lb 8 oz (83.2 kg)  08/01/21 185 lb 3.2 oz (84 kg)    GEN: Well nourished, well developed in no acute distress HEENT: Normal, moist mucous membranes NECK: No JVD CARDIAC: regular rhythm, normal S1 and S2, no rubs or gallops. No murmur. VASCULAR: Radial and DP pulses 2+ bilaterally. No carotid bruits RESPIRATORY:  Clear to auscultation without rales, wheezing or rhonchi  ABDOMEN: Soft, non-tender, non-distended MUSCULOSKELETAL:  Ambulates independently SKIN: Warm and dry, no edema; Ecchymosis of LUE NEUROLOGIC:  Alert and oriented x 3. No focal neuro deficits noted. PSYCHIATRIC:  Normal affect    ASSESSMENT:    1. Hyperlipidemia, mild   2. Family history of heart disease   3. OSA  (obstructive sleep apnea)   4. Cardiac risk counseling   5. Counseling on health promotion and disease prevention     PLAN:    Family history of heart disease  Mild dyslipidemia Review of CV risk -tolerating pravastatin 20 mg daily. Recent LDL 91, acceptable for primary prevention  OSA: on oral appliance  Cardiac risk counseling and prevention recommendations: -recommend heart healthy/Mediterranean diet, with whole grains, fruits, vegetable, fish, lean meats, nuts, and olive oil. Limit salt. -recommend moderate walking, 3-5 times/week for 30-50 minutes each session. Aim for at least 150 minutes.week. Goal should be pace of 3 miles/hours, or walking 1.5 miles in 30 minutes -recommend avoidance of tobacco products. Avoid excess alcohol. -ASCVD risk score: The 10-year ASCVD risk score (Arnett DK, et al., 2019) is: 7.2%   Values used to calculate the score:     Age: 41 years     Sex: Female     Is Non-Hispanic African American: No     Diabetic: No     Tobacco smoker: No     Systolic Blood Pressure: 299 mmHg     Is BP treated: No     HDL Cholesterol: 40 mg/dL     Total Cholesterol: 148 mg/dL    Plan for follow up: 1 year or sooner as needed   Chelsey Dresser, MD, PhD, [facility]    Medication Adjustments/Labs and Tests Ordered: Current medicines are reviewed at length with the patient today.  Concerns regarding medicines are outlined above.  Orders Placed This Encounter  Procedures   EKG 12-Lead   No orders of the defined types were placed in this encounter.   Patient Instructions  Medication Instructions:  Your Physician recommend you continue on your current medication as directed.    *If you need a refill on your cardiac medications before your next appointment, please call your pharmacy*   Jackson Work: None ordered today   Testing/Procedures: None ordered today   Follow-Up: At [facility], you and your health needs are our  priority.  As part of our continuing mission to provide you with exceptional heart care, we have created designated Provider Care Teams.  These Care Teams include your primary Cardiologist (physician)  and Advanced Practice Providers (APPs -  Physician Assistants and Nurse Practitioners) who all work together to provide you with the care you need, when you need it.  We recommend signing up for the patient portal called "MyChart".  Sign up information is provided on this After Visit Summary.  MyChart is used to connect with patients for Virtual Visits (Telemedicine).  Patients are able to view Jackson/test results, encounter notes, upcoming appointments, etc.  Non-urgent messages can be sent to your provider as well.   To learn more about what you can do with MyChart, go to [email or web address].    Your next appointment:   1 year(s)  The format for your next appointment:   In Person  Provider:   Buford Dresser, MD{           I,Jenifer Velasquez,acting as a scribe for Chelsey Dresser, MD.,have documented all relevant documentation on the behalf of Chelsey Dresser, MD,as directed by  Chelsey Dresser, MD while in the presence of Chelsey Dresser, MD.  I, Chelsey Dresser, MD, have reviewed all documentation for this visit. The documentation on 01/07/22 for the exam, diagnosis, procedures, and orders are all accurate and complete.  Signed, Chelsey Dresser, MD PhD 12/04/2021     [place]

## 2022-01-07 ENCOUNTER — Encounter (HOSPITAL_BASED_OUTPATIENT_CLINIC_OR_DEPARTMENT_OTHER): Payer: Self-pay | Admitting: Cardiology

## 2022-01-07 DIAGNOSIS — Z8249 Family history of ischemic heart disease and other diseases of the circulatory system: Secondary | ICD-10-CM | POA: Insufficient documentation

## 2022-01-13 ENCOUNTER — Other Ambulatory Visit: Payer: Self-pay | Admitting: Physician Assistant

## 2022-01-13 ENCOUNTER — Encounter: Payer: Self-pay | Admitting: *Deleted

## 2022-01-22 ENCOUNTER — Encounter: Payer: Self-pay | Admitting: Orthopedic Surgery

## 2022-01-22 ENCOUNTER — Ambulatory Visit (INDEPENDENT_AMBULATORY_CARE_PROVIDER_SITE_OTHER): Payer: Medicare Other | Admitting: Orthopedic Surgery

## 2022-01-22 DIAGNOSIS — G8929 Other chronic pain: Secondary | ICD-10-CM

## 2022-01-22 DIAGNOSIS — M25512 Pain in left shoulder: Secondary | ICD-10-CM

## 2022-01-22 NOTE — Progress Notes (Signed)
Office Visit Note   Patient: Chelsey Jackson           Date of Birth: 22-Mar-1951           MRN: [id number] Visit Date: 01/22/2022 Requested by: [name], [address] [place],  [place] [address] PCP: [name], [place]  Subjective: Chief Complaint  Patient presents with   Left Shoulder - Pain, Follow-up    HPI: Chelsey Jackson is a 71 y.o. female who presents to the office reporting continued left shoulder pain.  She had 1 week of relief from prior injection.  Reports relatively constant aching pain as well as pain with increased activity.  Pain wakes her from sleep at night.  She states her shoulder aches 24/7.  Was doing water aerobics this morning and just the motion of moving her arms around was very painful on the left-hand side.  She has had a history of prior arthroscopic surgery on that left shoulder over 10 years ago.  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            ROS: All systems reviewed are negative as they relate to the chief complaint within the history of present illness.  Patient denies fevers or chills.  Assessment & Plan: Visit Diagnoses:  1. Chronic left shoulder pain     Plan: Impression is rotator cuff pathology likely with maintenance of strength and range of motion but significant pain.  Hard to say if the pain is coming from rotator cuff pathology or the moderate arthritis.  Scan is reviewed with the patient.  In general I think that since we are considering surgical intervention that would be nice to know exactly how big the rotator cuff tear is as well as how much arthritis is present in the glenohumeral joint.  An updated MRI arthrogram could help in that regard.  Follow-up after that study with surgery likely to follow in the form of either arthroscopy  with biceps tenodesis and rotator cuff repair or reverse shoulder replacement.  Follow-Up Instructions: No follow-ups on file.   Orders:  Orders Placed This Encounter  Procedures   MR Shoulder Left w/ contrast   Arthrogram   No orders of the defined types were placed in this encounter.     Procedures: No procedures performed   Clinical Data: No additional findings.  Objective: Vital Signs: LMP  (LMP Unknown)   Physical Exam:  Constitutional: Patient appears well-developed HEENT:  Head: Normocephalic Eyes:EOM are normal Neck: Normal range of motion Cardiovascular: Normal rate Pulmonary/chest: Effort normal Neurologic: Patient is alert Skin: Skin is warm Psychiatric: Patient has normal mood and affect  Ortho Exam: Ortho exam demonstrates passive range of motion on the left of 60/95/165.  Patient 5 out of 5 subscap strength bilaterally.  Infraspinatus strength on the left.  Abduction strength 5+ out of 5.  No discrete AC joint tenderness on the left   Crepitus is present with internal/external rotation of the left shoulder  No other masses lymphadenopathy or skin changes noted in that shoulder girdle region  Specialty Comments:  No specialty comments available.  Imaging: No results found.   PMFS History: Patient Active Problem List   Diagnosis Date Noted   Family history of heart disease 01/07/2022   Allergic rhinitis 09/12/2020  Allergic rhinitis due to animal (cat) (dog) hair and dander 09/12/2020   Mild intermittent asthma 09/12/2020   Vasomotor rhinitis 09/12/2020   Hyperlipidemia, mild 05/17/2020   Impingement syndrome of left shoulder 04/03/2020   Urge incontinence 11/05/2018   Diverticulosis of colon without hemorrhage    Primary osteoarthritis of first carpometacarpal joint of left hand 06/22/2018   Chronic pain of left thumb 06/22/2018   History of retinal tear 10/20/2016   Cough 05/02/2016   Low back pain 09/28/2015   Insomnia 08/18/2013    OSA (obstructive sleep apnea) 07/31/2013   Thyroid nodule, cold 07/15/2012   Anxiety and depression 10/05/2007   Allergic rhinitis due to pollen 10/05/2007   Asthma 10/05/2007   Past Medical History:  Diagnosis Date   AC (acromioclavicular) joint bone spurs    left shoulder with chronic pain, w/limitation ROM   Allergy    weekly allergy shots   Anxiety    Arthritis    Asthma    Complication of anesthesia    difficult intubation   Depression    Fibrocystic breast disease    GERD (gastroesophageal reflux disease)    OSA (obstructive sleep apnea) 07/31/2013   Peptic ulcer disease    TMJ locking     Family History  Problem Relation Age of Onset   Osteoporosis Mother    Fibroids Mother    Other Mother        aortic valve replacement 1994, abdominal aortic aneurysm   Lung cancer Mother 55   Heart disease Father    Testicular cancer Father 72   Prostate cancer Father 45   Diabetes Maternal Grandmother    Heart disease Maternal Grandfather    Heart disease Paternal Grandmother    Diabetes Paternal Grandfather    Stomach cancer Paternal Grandfather    Breast cancer Paternal Aunt    Breast cancer Cousin    Colon cancer Neg Hx    Throat cancer Neg Hx    Pancreatic cancer Neg Hx     Past Surgical History:  Procedure Laterality Date   ABDOMINAL HYSTERECTOMY  2003   due to fibroids   ADENOIDECTOMY     x3   BIOPSY  06/29/2018   Procedure: BIOPSY;  Surgeon: Lavena Bullion, DO;  Location: [facility];  Service: Gastroenterology;;   BREAST CYST ASPIRATION     COLONOSCOPY  03/10/2007   results-normal   COLONOSCOPY WITH PROPOFOL N/A 06/29/2018   Procedure: COLONOSCOPY WITH PROPOFOL;  Surgeon: Lavena Bullion, DO;  Location: [facility];  Service: Gastroenterology;  Laterality: N/A;   HAMMER TOE SURGERY     POLYPECTOMY  06/29/2018   Procedure: POLYPECTOMY;  Surgeon: Lavena Bullion, DO;  Location: [facility];  Service: Gastroenterology;;   PUBOVAGINAL SLING  '90's    for incontinence   [place] Right 2013   [place]   ROTATOR CUFF REPAIR Left 2012   [place]   SHOULDER SURGERY     TONSILLECTOMY     x2   [place]   WRIST SURGERY     Social History   Occupational History   Not on file  Tobacco Use   Smoking status: Former    Packs/day: 2.00    Years: 15.00    Total pack years: 30.00    Types: Cigarettes    Quit date: 11/19/1989    Years since quitting: 32.1   Smokeless tobacco: Never  Vaping Use  Vaping Use: Never used  Substance and Sexual Activity   Alcohol use: Yes    Comment: maybe a glass of wine on the weekend   Drug use: No   Sexual activity: Not on file

## 2022-02-10 ENCOUNTER — Other Ambulatory Visit: Payer: Medicare Other

## 2022-02-14 ENCOUNTER — Ambulatory Visit
Admission: RE | Admit: 2022-02-14 | Discharge: 2022-02-14 | Disposition: A | Discharge status: Home / Self Care | Payer: Medicare Other | Source: Ambulatory Visit | Attending: Orthopedic Surgery | Admitting: Orthopedic Surgery

## 2022-02-14 DIAGNOSIS — G8929 Other chronic pain: Secondary | ICD-10-CM

## 2022-02-14 MED ORDER — IOPAMIDOL (ISOVUE-M 200) INJECTION 41%
12.0000 mL | Freq: Once | INTRAMUSCULAR | Status: AC
  Administered 2022-02-14: 12 mL via INTRA_ARTICULAR

## 2022-02-18 ENCOUNTER — Other Ambulatory Visit: Payer: Self-pay | Admitting: Physician Assistant

## 2022-02-18 NOTE — Telephone Encounter (Signed)
Pt requesting refill for Temazepam 30 mg. Last OV 08/01/2021.

## 2022-02-19 ENCOUNTER — Other Ambulatory Visit: Payer: Self-pay

## 2022-02-19 ENCOUNTER — Encounter: Payer: Self-pay | Admitting: Physician Assistant

## 2022-02-19 ENCOUNTER — Ambulatory Visit (INDEPENDENT_AMBULATORY_CARE_PROVIDER_SITE_OTHER): Payer: Medicare Other | Admitting: Orthopedic Surgery

## 2022-02-19 DIAGNOSIS — M75102 Unspecified rotator cuff tear or rupture of left shoulder, not specified as traumatic: Secondary | ICD-10-CM | POA: Diagnosis not present

## 2022-02-20 ENCOUNTER — Encounter: Payer: Self-pay | Admitting: Orthopedic Surgery

## 2022-02-20 ENCOUNTER — Encounter: Payer: Self-pay | Admitting: Physician Assistant

## 2022-02-20 ENCOUNTER — Other Ambulatory Visit: Payer: Self-pay | Admitting: Physician Assistant

## 2022-02-20 ENCOUNTER — Ambulatory Visit (INDEPENDENT_AMBULATORY_CARE_PROVIDER_SITE_OTHER): Payer: Medicare Other | Admitting: Physician Assistant

## 2022-02-20 VITALS — BP 120/76 | HR 77 | Temp 97.8°F | Ht 68.0 in | Wt 185.5 lb

## 2022-02-20 DIAGNOSIS — F419 Anxiety disorder, unspecified: Secondary | ICD-10-CM | POA: Diagnosis not present

## 2022-02-20 DIAGNOSIS — Z01818 Encounter for other preprocedural examination: Secondary | ICD-10-CM | POA: Diagnosis not present

## 2022-02-20 DIAGNOSIS — F32A Depression, unspecified: Secondary | ICD-10-CM | POA: Diagnosis not present

## 2022-02-20 DIAGNOSIS — J4520 Mild intermittent asthma, uncomplicated: Secondary | ICD-10-CM

## 2022-02-20 DIAGNOSIS — Z0000 Encounter for general adult medical examination without abnormal findings: Secondary | ICD-10-CM

## 2022-02-20 DIAGNOSIS — E785 Hyperlipidemia, unspecified: Secondary | ICD-10-CM

## 2022-02-20 DIAGNOSIS — G4733 Obstructive sleep apnea (adult) (pediatric): Secondary | ICD-10-CM

## 2022-02-20 DIAGNOSIS — F5101 Primary insomnia: Secondary | ICD-10-CM | POA: Diagnosis not present

## 2022-02-20 DIAGNOSIS — G8929 Other chronic pain: Secondary | ICD-10-CM

## 2022-02-20 DIAGNOSIS — M25512 Pain in left shoulder: Secondary | ICD-10-CM

## 2022-02-20 DIAGNOSIS — Z1231 Encounter for screening mammogram for malignant neoplasm of breast: Secondary | ICD-10-CM

## 2022-02-20 LAB — CBC WITH DIFFERENTIAL/PLATELET
Basophils Absolute: 0.1 10*3/uL (ref 0.0–0.1)
Basophils Relative: 1.3 % (ref 0.0–3.0)
Eosinophils Absolute: 0.2 10*3/uL (ref 0.0–0.7)
Eosinophils Relative: 3.1 % (ref 0.0–5.0)
HCT: 38.8 % (ref 36.0–46.0)
Hemoglobin: 12.9 g/dL (ref 12.0–15.0)
Lymphocytes Relative: 28.2 % (ref 12.0–46.0)
Lymphs Abs: 1.6 10*3/uL (ref 0.7–4.0)
MCHC: 33.2 g/dL (ref 30.0–36.0)
MCV: 86.4 fl (ref 78.0–100.0)
Monocytes Absolute: 0.5 10*3/uL (ref 0.1–1.0)
Monocytes Relative: 8.4 % (ref 3.0–12.0)
Neutro Abs: 3.3 10*3/uL (ref 1.4–7.7)
Neutrophils Relative %: 59 % (ref 43.0–77.0)
Platelets: 262 10*3/uL (ref 150.0–400.0)
RBC: 4.49 Mil/uL (ref 3.87–5.11)
RDW: 14.4 % (ref 11.5–15.5)
WBC: 5.6 10*3/uL (ref 4.0–10.5)

## 2022-02-20 LAB — COMPREHENSIVE METABOLIC PANEL
ALT: 31 U/L (ref 0–35)
AST: 28 U/L (ref 0–37)
Albumin: 4.6 g/dL (ref 3.5–5.2)
Alkaline Phosphatase: 45 U/L (ref 39–117)
BUN: 21 mg/dL (ref 6–23)
CO2: 27 mEq/L (ref 19–32)
Calcium: 9.6 mg/dL (ref 8.4–10.5)
Chloride: 107 mEq/L (ref 96–112)
Creatinine, Ser: 0.87 mg/dL (ref 0.40–1.20)
GFR: 68.09 mL/min (ref >60.00)
Glucose, Bld: 90 mg/dL (ref 70–99)
Potassium: 3.8 mEq/L (ref 3.5–5.1)
Sodium: 143 mEq/L (ref 135–145)
Total Bilirubin: 0.4 mg/dL (ref 0.2–1.2)
Total Protein: 7 g/dL (ref 6.0–8.3)

## 2022-02-20 LAB — HEMOGLOBIN A1C: Hgb A1c MFr Bld: 5.3 % (ref 4.6–6.5)

## 2022-02-20 MED ORDER — TEMAZEPAM 30 MG PO CAPS: 30.0000 mg | ORAL_CAPSULE | Freq: Every day | ORAL | 1 refills | Status: DC

## 2022-02-20 NOTE — Progress Notes (Signed)
Subjective:    Chelsey Jackson is a 71 y.o. female and is here for a comprehensive physical exam.  HPI  There are no preventive care reminders to display for this patient.  Acute Concerns: Left Rotator Cuff Tear She is scheduled for left shoulder arthroscopy RTC tear repair on 03/25/22 with Dr. Marlou Sa. Reports Hx of prior left should scope without repair.  Chronic Issues: Allergies and Asthma Takes Xyzal and Flonase. Was on Singulair at one point. Was doing allergy shots up until 2 years ago. Had been doing them for 10 years. Sees Dr. Fredderick Phenix -- 09/18/2021 last visit.  Currently has Albuterol inhaler on hand. Not taking regularly, has not needed it.   Insomnia Currently taking Restoril 30 mg daily. She states that most nights she experiences nocturia.   Anxiety and Depression Currently taking Wellbutrin 300 mg XL daily. Doing well without any concerns. Had [place] therapy last year with improvement. Sees Benjiman Core for therapy once monthly. Denies SI/HI.  OSA Well controlled. No longer using CPAP. Using sleep apnea mouth guard.   Health Maintenance: Immunizations -- UTD Colonoscopy -- 06/29/2018, polyps removed, due in 2025 Mammogram -- 01/25/2021, negative. Due for mammogram, needs to schedule. PAP -- 2009 Bone Density -- 2020, osteopenia Diet -- Well balanced Exercise -- Staying active  Sleep habits -- attributes sleep problems to age, wakes to urinate at night Mood -- Doing well on Wellbutrin XL '300mg'$  s/p [place] therapy  UTD with dentist? - Yes UTD with eye doctor? - Yes UTD with dermatology- Yes, goes annually.  Weight history: Wt Readings from Last 10 Encounters:  02/20/22 185 lb 8 oz (84.1 kg)  12/04/21 183 lb 9.6 oz (83.3 kg)  11/11/21 183 lb 8 oz (83.2 kg)  08/01/21 185 lb 3.2 oz (84 kg)  06/26/21 184 lb 8 oz (83.7 kg)  05/21/21 184 lb 8 oz (83.7 kg)  02/20/21 196 lb 3.2 oz (89 kg)  01/30/21 188 lb 9.6 oz (85.5 kg)  01/04/21 190 lb (86.2 kg)   09/20/20 189 lb (85.7 kg)   Body mass index is 28.21 kg/m. No LMP recorded (lmp unknown). Patient has had a hysterectomy.  Alcohol use:  reports current alcohol use.  Tobacco use:  Tobacco Use: Medium Risk (02/20/2022)   Patient History    Smoking Tobacco Use: Former    Smokeless Tobacco Use: Never    Passive Exposure: Not on file   Eligible for lung cancer screening? no     07/18/2021   10:06 AM  Depression screen PHQ 2/9  Decreased Interest 0  Down, Depressed, Hopeless 0  PHQ - 2 Score 0     Other providers/specialists: Patient Care Team: [name], [place] as PCP - General (Physician Assistant) Buford Dresser, MD as PCP - Cardiology (Cardiology) Azucena Fallen, MD as Attending Physician (Obstetrics and Gynecology) Garald Balding, MD (Orthopedic Surgery) Harold Hedge, Darrick Grinder, MD (Allergy and Immunology) Norma Fredrickson, MD (Psychiatry)    PMHx, SurgHx, SocialHx, Medications, and Allergies were reviewed in the Visit Navigator and updated as appropriate.   Past Medical History:  Diagnosis Date   AC (acromioclavicular) joint bone spurs    left shoulder with chronic pain, w/limitation ROM   Allergy    weekly allergy shots   Anxiety    Arthritis    Asthma    Complication of anesthesia    difficult intubation   Depression    Fibrocystic breast disease    GERD (gastroesophageal reflux disease)    OSA (obstructive sleep  apnea) 07/31/2013   Peptic ulcer disease    TMJ locking      Past Surgical History:  Procedure Laterality Date   ABDOMINAL HYSTERECTOMY  2003   due to fibroids   ADENOIDECTOMY     x3   BIOPSY  06/29/2018   Procedure: BIOPSY;  Surgeon: Lavena Bullion, DO;  Location: [facility];  Service: Gastroenterology;;   BREAST CYST ASPIRATION     COLONOSCOPY  03/10/2007   results-normal   COLONOSCOPY WITH PROPOFOL N/A 06/29/2018   Procedure: COLONOSCOPY WITH PROPOFOL;  Surgeon: Lavena Bullion, DO;  Location: [facility];   Service: Gastroenterology;  Laterality: N/A;   HAMMER TOE SURGERY     POLYPECTOMY  06/29/2018   Procedure: POLYPECTOMY;  Surgeon: Lavena Bullion, DO;  Location: [facility];  Service: Gastroenterology;;   PUBOVAGINAL SLING  '90's   for incontinence   [place] Right 2013   [facility]   [place] Left 2012   [facility]   SHOULDER SURGERY     TONSILLECTOMY     x2   TUBAL LIGATION     TURBINATE REDUCTION  1997   WRIST SURGERY       Family History  Problem Relation Age of Onset   Osteoporosis Mother    Fibroids Mother    Other Mother        aortic valve replacement 1994, abdominal aortic aneurysm   Lung cancer Mother 63   Heart disease Father    Testicular cancer Father 16   Prostate cancer Father 19   Leukemia Father        CLL   Diabetes Maternal Grandmother    Heart disease Maternal Grandfather    Heart disease Paternal Grandmother    Diabetes Paternal Grandfather    Stomach cancer Paternal Grandfather    Breast cancer Paternal Aunt    Breast cancer Cousin    Colon cancer Neg Hx    Throat cancer Neg Hx    Pancreatic cancer Neg Hx     Social History   Tobacco Use   Smoking status: Former    Packs/day: 2.00    Years: 15.00    Total pack years: 30.00    Types: Cigarettes    Quit date: 11/19/1989    Years since quitting: 32.2   Smokeless tobacco: Never  Vaping Use   Vaping Use: Never used  Substance Use Topics   Alcohol use: Yes    Comment: maybe a glass of wine on the weekend   Drug use: No    Review of Systems:   Review of Systems  Constitutional:  Negative for chills, fever, malaise/fatigue and weight loss.  HENT:  Negative for hearing loss, sinus pain and sore throat.   Respiratory:  Negative for cough and hemoptysis.   Cardiovascular:  Negative for chest pain, palpitations, leg swelling and PND.  Gastrointestinal:  Negative for abdominal pain, constipation, diarrhea, heartburn, nausea and vomiting.   Genitourinary:  Negative for dysuria, frequency and urgency.       + Nocturia  Musculoskeletal:  Negative for back pain, myalgias and neck pain.  Skin:  Negative for itching and rash.  Neurological:  Negative for dizziness, tingling, seizures and headaches.  Endo/Heme/Allergies:  Negative for polydipsia.  Psychiatric/Behavioral:  Negative for depression. The patient is not nervous/anxious.     Objective:   BP 120/76 (BP Location: Left Arm, Patient Position: Sitting, Cuff Size: Normal)   Pulse 77   Temp 97.8 F (36.6  C) (Temporal)   Ht '5\' 8"'$  (1.727 m)   Wt 185 lb 8 oz (84.1 kg)   LMP  (LMP Unknown)   SpO2 96%   BMI 28.21 kg/m  Body mass index is 28.21 kg/m.   General Appearance:    Alert, cooperative, no distress, appears stated age  Head:    Normocephalic, without obvious abnormality, atraumatic  Eyes:    PERRL, conjunctiva/corneas clear, EOM's intact, fundi    benign, both eyes  Ears:    Normal TM's and external ear canals, both ears  Nose:   Nares normal, septum midline, mucosa normal, no drainage    or sinus tenderness  Throat:   Lips, mucosa, and tongue normal; teeth and gums normal  Neck:   Supple, symmetrical, trachea midline, no adenopathy;    thyroid:  no enlargement/tenderness/nodules; no carotid   bruit or JVD  Back:     Symmetric, no curvature, ROM normal, no CVA tenderness  Lungs:     Clear to auscultation bilaterally, respirations unlabored  Chest Wall:    No tenderness or deformity   Heart:    Regular rate and rhythm, S1 and S2 normal, no murmur, rub or gallop  Breast Exam:    Deferred  Abdomen:     Soft, non-tender, bowel sounds active all four quadrants,    no masses, no organomegaly  Genitalia:    Deferred  Extremities:   Extremities normal, atraumatic, no cyanosis or edema  Pulses:   2+ and symmetric all extremities  Skin:   Skin color, texture, turgor normal, no rashes or lesions  Lymph nodes:   Cervical, supraclavicular, and axillary nodes normal   Neurologic:   CNII-XII intact, normal strength, sensation and reflexes    throughout    Assessment/Plan:   Routine physical examination Today patient counseled on age appropriate routine health concerns for screening and prevention, each reviewed and up to date or declined. Immunizations reviewed and up to date or declined. Labs ordered and reviewed. Risk factors for depression reviewed and negative. Hearing function and visual acuity are intact. ADLs screened and addressed as needed. Functional ability and level of safety reviewed and appropriate. Education, counseling and referrals performed based on assessed risks today. Patient provided with a copy of personalized plan for preventive services.  Anxiety and depression Well controlled per patient Continue Wellbutrin 300 mg daily Continue talk therapy  Follow-up in 1 year, sooner if concerns  Hyperlipidemia, mild Well controlled Continue crestor 5 mg daily Follow-up with cardiology as advised  Primary insomnia Well controlled Continue restoril 30 mg daily Follow-up in 6 months, sooner if concerns  Pre-operative clearance; Chronic left shoulder pain Will complete any paperwork needed for this She is a low risk candidate for surgery  Mild intermittent asthma without complication Well controlled Continue albuterol prn  OSA (obstructive sleep apnea) Well controlled with oral appliance   I,Alexis Herring,acting as a scribe for [employer], PA.,have documented all relevant documentation on the behalf of [name], PA,as directed by  [name], PA while in the presence of [name], [place].  I, [name], [place], have reviewed all documentation for this visit. The documentation on 02/20/22 for the exam, diagnosis, procedures, and orders are all accurate and complete.  [name] PA-C

## 2022-02-20 NOTE — Progress Notes (Signed)
Office Visit Note   Patient: Chelsey Jackson           Date of Birth: Oct 22, 1950           MRN: [id number] Visit Date: 02/19/2022 Requested by: [name], [place],  [place] [id number] PCP: [name], [place]  Subjective: Chief Complaint  Patient presents with   Left Shoulder - Pain    HPI: Chelsey Jackson is a 71 y.o. female who presents to the office reporting left shoulder pain.  She reports pain as well as weakness.  Hurts her to roll over.  She has had 2 years of symptoms.  She has a history of prior surgery on that left shoulder which sounds like it was more of an arthroscopic debridement type procedure.  MRI scan shows no anchors in the humeral head.  MRI scan also shows a large full-thickness near complete tear of the supraspinatus tendon with 24 mm of retraction along with moderate tendinosis of the long head of the biceps tendon..                ROS: All systems reviewed are negative as they relate to the chief complaint within the history of present illness.  Patient denies fevers or chills.  Assessment & Plan: Visit Diagnoses: No diagnosis found.  Plan: Impression is rotator cuff tear left shoulder symptomatic for 2 years with failure of conservative management including injections as well as exercises and therapy.  Patient has daily symptoms.  Decision point today was for or against rotator cuff repair versus reverse shoulder replacement.  In the absence of significant arthritis in the glenohumeral joint and with moderate retraction of the rotator cuff tear I think an attempt at rotator cuff repair is indicated.  No real atrophy in the supraspinatus muscle belly is present.  I think the biceps tendon could be a big pain generator in the shoulder as well.  No evidence on exam that she has adhesive capsulitis.  Plan at this time would be arthroscopy with biceps tendon release superior labral debridement biceps tenodesis and mini open rotator cuff  tendon repair.  Would have to adjust anchor placement techniques based on bone quality.  The risk and benefits of the procedure discussed with the patient including not limited to infection or vessel damage shoulder stiffness incomplete pain relief as well as incomplete restoration of function.  Patient understands risk and benefits and wishes to proceed.  All questions answered  Follow-Up Instructions: No follow-ups on file.   Orders:  No orders of the defined types were placed in this encounter.  No orders of the defined types were placed in this encounter.     Procedures: No procedures performed   Clinical Data: No additional findings.  Objective: Vital Signs: LMP  (LMP Unknown)   Physical Exam:  Constitutional: Patient appears well-developed HEENT:  Head: Normocephalic Eyes:EOM are normal Neck: Normal range of motion Cardiovascular: Normal rate Pulmonary/chest: Effort normal Neurologic: Patient is alert Skin: Skin is warm Psychiatric: Patient has normal mood and affect  Ortho Exam: Ortho exam demonstrates on that left-hand side passive range of motion of 70/95/170.  She does have some weakness to supraspinatus testing on the left compared to the right.  Does have positive tenderness to palpation over the bicipital groove.  Positive speeds test.  Does have a little coarseness with internal/external rotation at 90 degrees of abduction but that localizes to the anterolateral aspect of the shoulder.  Specialty Comments:  No  specialty comments available.  Imaging: No results found.   PMFS History: Patient Active Problem List   Diagnosis Date Noted   Family history of heart disease 01/07/2022   Allergic rhinitis 09/12/2020   Allergic rhinitis due to animal (cat) (dog) hair and dander 09/12/2020   Mild intermittent asthma 09/12/2020   Vasomotor rhinitis 09/12/2020   Hyperlipidemia, mild 05/17/2020   Impingement syndrome of left shoulder 04/03/2020   Urge incontinence  11/05/2018   Diverticulosis of colon without hemorrhage    Primary osteoarthritis of first carpometacarpal joint of left hand 06/22/2018   Chronic pain of left thumb 06/22/2018   History of retinal tear 10/20/2016   Cough 05/02/2016   Low back pain 09/28/2015   Insomnia 08/18/2013   OSA (obstructive sleep apnea) 07/31/2013   Thyroid nodule, cold 07/15/2012   Anxiety and depression 10/05/2007   Allergic rhinitis due to pollen 10/05/2007   Asthma 10/05/2007   Past Medical History:  Diagnosis Date   AC (acromioclavicular) joint bone spurs    left shoulder with chronic pain, w/limitation ROM   Allergy    weekly allergy shots   Anxiety    Arthritis    Asthma    Complication of anesthesia    difficult intubation   Depression    Fibrocystic breast disease    GERD (gastroesophageal reflux disease)    OSA (obstructive sleep apnea) 07/31/2013   Peptic ulcer disease    TMJ locking     Family History  Problem Relation Age of Onset   Osteoporosis Mother    Fibroids Mother    Other Mother        aortic valve replacement 1994, abdominal aortic aneurysm   Lung cancer Mother 8   Heart disease Father    Testicular cancer Father 46   Prostate cancer Father 46   Leukemia Father        CLL   Diabetes Maternal Grandmother    Heart disease Maternal Grandfather    Heart disease Paternal Grandmother    Diabetes Paternal Grandfather    Stomach cancer Paternal Grandfather    Breast cancer Paternal Aunt    Breast cancer Cousin    Colon cancer Neg Hx    Throat cancer Neg Hx    Pancreatic cancer Neg Hx     Past Surgical History:  Procedure Laterality Date   ABDOMINAL HYSTERECTOMY  2003   due to fibroids   ADENOIDECTOMY     x3   BIOPSY  06/29/2018   Procedure: BIOPSY;  Surgeon: Lavena Bullion, DO;  [facility]: [facility];  Service: Gastroenterology;;   BREAST CYST ASPIRATION     COLONOSCOPY  03/10/2007   results-normal   COLONOSCOPY WITH PROPOFOL N/A 06/29/2018   Procedure:  COLONOSCOPY WITH PROPOFOL;  Surgeon: Lavena Bullion, DO;  Location: [facility];  Service: Gastroenterology;  Laterality: N/A;   HAMMER TOE SURGERY     POLYPECTOMY  06/29/2018   Procedure: POLYPECTOMY;  Surgeon: Lavena Bullion, DO;  [facility];  Service: Gastroenterology;;   PUBOVAGINAL SLING  '90's   for incontinence   [place] Right 2013   [facility]   ROTATOR CUFF REPAIR Left 2012   [facility]   SHOULDER SURGERY     TONSILLECTOMY     x2   [place] History   Occupational History   Not on file  Tobacco Use  Smoking status: Former    Packs/day: 2.00    Years: 15.00    Total pack years: 30.00    Types: Cigarettes    Quit date: 11/19/1989    Years since quitting: 32.2   Smokeless tobacco: Never  Vaping Use   Vaping Use: Never used  Substance and Sexual Activity   Alcohol use: Yes    Comment: maybe a glass of wine on the weekend   Drug use: No   Sexual activity: Not on file

## 2022-02-20 NOTE — Patient Instructions (Signed)
It was great to see you!  Get your mammogram  Please go to the lab for blood work.   Our office will call you with your results unless you have chosen to receive results via MyChart.  If your blood work is normal we will follow-up each year for physicals and as scheduled for chronic medical problems.  If anything is abnormal we will treat accordingly and get you in for a follow-up.  Take care,  Aldona Bar

## 2022-02-24 ENCOUNTER — Telehealth: Payer: Self-pay | Admitting: *Deleted

## 2022-02-24 ENCOUNTER — Encounter: Payer: Self-pay | Admitting: Orthopedic Surgery

## 2022-02-24 NOTE — Telephone Encounter (Signed)
Called [facility] spoke to [place]yx told him PA for Temazepam 30 mg has been approved by insurance. Darius said looks like pt picked up with a discount card. He asked if I wanted him to run with insurance? Told him yes. He said cheaper with discount card, higher copay with insurance. Told him okay.

## 2022-02-24 NOTE — Telephone Encounter (Signed)
Patient has been on this medication for more than 3 years. Can you complete a prior authorization? Is there an alternative that they will cover?

## 2022-02-24 NOTE — Telephone Encounter (Signed)
Left message on voicemail to call office.  

## 2022-02-24 NOTE — Telephone Encounter (Signed)
Received response from [place] has approved a request from you or your doctor for TEMAZEPAM Capsule '30MG'$ . This approval is for 01/25/2022 until further notice.

## 2022-02-24 NOTE — Telephone Encounter (Signed)
Prior authorization done through Covermymeds. Awaiting response. Key: [id number]

## 2022-02-24 NOTE — Telephone Encounter (Signed)
Noted, pt already picked up prescription.

## 2022-02-24 NOTE — Telephone Encounter (Signed)
Received fax from pharmacy Temazepam 30 mg is not covered by insurance. Please advise.

## 2022-02-24 NOTE — Telephone Encounter (Signed)
Error

## 2022-03-06 NOTE — Pre-Procedure Instructions (Signed)
Surgical Instructions    Your procedure is scheduled on Tuesday, December 5th.  Report to [facility] Main Entrance "A" at 1:00 P.M., then check in with the Admitting office.  Call this number if you have problems the morning of surgery:  [phone]   If you have any questions prior to your surgery date call [phone]: Open Monday-Friday 8am-4pm    Remember:  Do not eat after midnight the night before your surgery  You may drink clear liquids until 12:00 PM the day of your surgery.   Clear liquids allowed are: Water, Non-Citrus Juices (without pulp), Carbonated Beverages, Clear Tea, Black Coffee Only (NO MILK, CREAM OR POWDERED CREAMER of any kind), and Gatorade.   Patient Instructions  The night before surgery:  No food after midnight. ONLY clear liquids after midnight  The day of surgery (if you do NOT have diabetes):  Drink ONE (1) Pre-Surgery Clear Ensure by 12:00 PM the day of surgery. Drink in one sitting. Do not sip.  This drink was given to you during your hospital  pre-op appointment visit.  Nothing else to drink after completing the  Pre-Surgery Clear Ensure.          If you have questions, please contact your surgeon's office.     Take these medicines the morning of surgery with A SIP OF WATER  buPROPion (WELLBUTRIN XL)  ipratropium (ATROVENT)  rosuvastatin (CRESTOR)   If needed: albuterol (VENTOLIN HFA)- if needed, bring with you on day of surgery EPINEPHrine  hydroxypropyl methylcellulose / hypromellose (ISOPTO TEARS / GONIOVISC)   As of today, STOP taking any Aspirin (unless otherwise instructed by your surgeon) Aleve, Naproxen, Ibuprofen, Motrin, Advil, Goody's, BC's, all herbal medications, fish oil, and all vitamins. This includes diclofenac Sodium (VOLTAREN) 1 % GEL.                     Do NOT Smoke (Tobacco/Vaping) for 24 hours prior to your procedure.  If you use a CPAP at night, you may bring your mask/headgear for your overnight stay.    Contacts, glasses, piercing's, hearing aid's, dentures or partials may not be worn into surgery, please bring cases for these belongings.    For patients admitted to the hospital, discharge time will be determined by your treatment team.   Patients discharged the day of surgery will not be allowed to drive home, and someone needs to stay with them for 24 hours.  SURGICAL WAITING ROOM VISITATION Patients having surgery or a procedure may have no more than 2 support people in the waiting area - these visitors may rotate.   Children under the age of 69 must have an adult with them who is not the patient. If the patient needs to stay at the hospital during part of their recovery, the visitor guidelines for inpatient rooms apply. Pre-op nurse will coordinate an appropriate time for 1 support person to accompany patient in pre-op.  This support person may not rotate.   Please refer to the [facility] website for the visitor guidelines for Inpatients (after your surgery is over and you are in a regular room).    Special instructions:   [place]- Preparing For Surgery  Before surgery, you can play an important role. Because skin is not sterile, your skin needs to be as free of germs as possible. You can reduce the number of germs on your skin by washing with CHG (chlorahexidine gluconate) Soap before surgery.  CHG is an antiseptic cleaner which kills  germs and bonds with the skin to continue killing germs even after washing.    Oral Hygiene is also important to reduce your risk of infection.  Remember - BRUSH YOUR TEETH THE MORNING OF SURGERY WITH YOUR REGULAR TOOTHPASTE  Please do not use if you have an allergy to CHG or antibacterial soaps. If your skin becomes reddened/irritated stop using the CHG.  Do not shave (including legs and underarms) for at least 48 hours prior to first CHG shower. It is OK to shave your face.  Please follow these instructions carefully.   Shower the NIGHT BEFORE  SURGERY and the MORNING OF SURGERY  If you chose to wash your hair, wash your hair first as usual with your normal shampoo.  After you shampoo, rinse your hair and body thoroughly to remove the shampoo.  Use CHG Soap as you would any other liquid soap. You can apply CHG directly to the skin and wash gently with a scrungie or a clean washcloth.   Apply the CHG Soap to your body ONLY FROM THE NECK DOWN.  Do not use on open wounds or open sores. Avoid contact with your eyes, ears, mouth and genitals (private parts). Wash Face and genitals (private parts)  with your normal soap.   Wash thoroughly, paying special attention to the area where your surgery will be performed.  Thoroughly rinse your body with warm water from the neck down.  DO NOT shower/wash with your normal soap after using and rinsing off the CHG Soap.  Pat yourself dry with a CLEAN TOWEL.  Wear CLEAN PAJAMAS to bed the night before surgery  Place CLEAN SHEETS on your bed the night before your surgery  DO NOT SLEEP WITH PETS.   Day of Surgery: Take a shower with CHG soap. Do not wear jewelry or makeup Do not wear lotions, powders, perfumes, or deodorant. Do not shave 48 hours prior to surgery.   Do not bring valuables to the hospital. [facility] is not responsible for any belongings or valuables. Do not wear nail polish, gel polish, artificial nails, or any other type of covering on natural nails (fingers and toes) If you have artificial nails or gel coating that need to be removed by a nail salon, please have this removed prior to surgery. Artificial nails or gel coating may interfere with anesthesia's ability to adequately monitor your vital signs. Wear Clean/Comfortable clothing the morning of surgery Remember to brush your teeth WITH YOUR REGULAR TOOTHPASTE.   Please read over the following fact sheets that you were given.    If you received a COVID test during your pre-op visit  it is requested that you wear a  mask when out in public, stay away from anyone that may not be feeling well and notify your surgeon if you develop symptoms. If you have been in contact with anyone that has tested positive in the last 10 days please notify you surgeon.

## 2022-03-07 ENCOUNTER — Encounter (HOSPITAL_COMMUNITY)
Admission: RE | Admit: 2022-03-07 | Discharge: 2022-03-07 | Disposition: A | Discharge status: Home / Self Care | Payer: Medicare Other | Source: Ambulatory Visit | Attending: Orthopedic Surgery | Admitting: Orthopedic Surgery

## 2022-03-07 ENCOUNTER — Encounter (HOSPITAL_COMMUNITY): Payer: Self-pay

## 2022-03-07 ENCOUNTER — Other Ambulatory Visit: Payer: Self-pay

## 2022-03-07 DIAGNOSIS — Z01818 Encounter for other preprocedural examination: Secondary | ICD-10-CM | POA: Insufficient documentation

## 2022-03-07 HISTORY — DX: Anemia, unspecified: D64.9

## 2022-03-07 LAB — BASIC METABOLIC PANEL
Anion gap: 6 (ref 5–15)
BUN: 24 mg/dL — ABNORMAL HIGH (ref 8–23)
CO2: 28 mmol/L (ref 22–32)
Calcium: 9.6 mg/dL (ref 8.9–10.3)
Chloride: 109 mmol/L (ref 98–111)
Creatinine, Ser: 0.94 mg/dL (ref 0.44–1.00)
GFR, Estimated: >60 mL/min (ref >60)
Glucose, Bld: 98 mg/dL (ref 70–99)
Potassium: 4 mmol/L (ref 3.5–5.1)
Sodium: 143 mmol/L (ref 135–145)

## 2022-03-07 LAB — CBC
HCT: 37.6 % (ref 36.0–46.0)
Hemoglobin: 12.7 g/dL (ref 12.0–15.0)
MCH: 28.9 pg (ref 26.0–34.0)
MCHC: 33.8 g/dL (ref 30.0–36.0)
MCV: 85.6 fL (ref 80.0–100.0)
Platelets: 235 10*3/uL (ref 150–400)
RBC: 4.39 MIL/uL (ref 3.87–5.11)
RDW: 13.4 % (ref 11.5–15.5)
WBC: 5.1 10*3/uL (ref 4.0–10.5)
nRBC: 0 % (ref 0.0–0.2)

## 2022-03-07 NOTE — Progress Notes (Signed)
PCP - [name], PA Cardiologist - Dr. Buford Dresser  PPM/ICD - denies   Chest x-ray - 05/02/16 EKG - 12/04/21 Stress Test - denies ECHO - denies Cardiac Cath - denies  Sleep Study - 10/11/20 CPAP - denies, pt states she wears an oral device  DM- denies  Last dose of GLP1 agonist-  n/a   ASA/Blood Thinner Instructions: n/a   ERAS Protcol - yes PRE-SURGERY Ensure given at PAT  COVID TEST- n/a   Anesthesia review: yes, cardiac hx. Pt also states she has a "limited oral opening" in relation to intubation but states that she has never had difficulties with intubation.  Patient denies shortness of breath, fever, cough and chest pain at PAT appointment   All instructions explained to the patient, with a verbal understanding of the material. Patient agrees to go over the instructions while at home for a better understanding.  The opportunity to ask questions was provided.

## 2022-03-10 NOTE — Anesthesia Preprocedure Evaluation (Addendum)
Anesthesia Evaluation  Patient identified by MRN, date of birth, ID band Patient awake    Reviewed: Allergy & Precautions, NPO status , Patient's Chart, lab work & pertinent test results  History of Anesthesia Complications Negative for: history of anesthetic complications  Airway Mallampati: III  TM Distance: <3 FB Neck ROM: Full  Mouth opening: Limited Mouth Opening  Dental no notable dental hx.    Pulmonary asthma , sleep apnea , former smoker   Pulmonary exam normal        Cardiovascular negative cardio ROS Normal cardiovascular exam     Neuro/Psych   Anxiety Depression       GI/Hepatic Neg liver ROS, PUD,GERD  Controlled,,  Endo/Other  negative endocrine ROS    Renal/GU negative Renal ROS  negative genitourinary   Musculoskeletal  (+) Arthritis ,  left shoulder biceps tendinitis, rotator cuff tear   Abdominal   Peds  Hematology negative hematology ROS (+)   Anesthesia Other Findings Day of surgery medications reviewed with patient.  Reproductive/Obstetrics negative OB ROS                             Anesthesia Physical Anesthesia Plan  ASA: 2  Anesthesia Plan: General   Post-op Pain Management: Tylenol PO (pre-op)* and Regional block*   Induction: Intravenous  PONV Risk Score and Plan: 3 and Treatment may vary due to age or medical condition, Ondansetron, Dexamethasone and Midazolam  Airway Management Planned: Oral ETT  Additional Equipment: None  Intra-op Plan:   Post-operative Plan: Extubation in OR  Informed Consent: I have reviewed the patients History and Physical, chart, labs and discussed the procedure including the risks, benefits and alternatives for the proposed anesthesia with the patient or authorized representative who has indicated his/her understanding and acceptance.     Dental advisory given  Plan Discussed with: CRNA  Anesthesia Plan Comments:  (PAT note written by Myra Gianotti, PA-C. Reported limited mouth opening. No difficult intubation in 2003.   )       Anesthesia Quick Evaluation

## 2022-03-10 NOTE — Progress Notes (Addendum)
Anesthesia Chart Review:  Case: [id number] Date/Time: 03/25/22 1436   Procedures:      LEFT SHOULDER ARTHROSCOPY, SUBACROMIAL DECOMPRESSION, DEBRIDEMENT, MINI OPEN ROTATOR CUFF TEAR REPAIR (Left)     BICEPS TENODESIS (Left)   Anesthesia type: General   Pre-op diagnosis: left shoulder biceps tendinitis, rotator cuff tear   [facility]: [facility] ROOM 06 / [facility]   Surgeons: [name], MD       DISCUSSION: Patient is a 71 year old female scheduled for the above procedure.  History includes former smoker (quit 11/19/89), asthma, GERD, PUD, anemia, OSA (moderate OSA, AHI 17/hr 09/2020, uses oral appliance), TMJ, T&A, rhinoplasty (4782), vaginal hysterectomy (10/27/01). She reported "limited oral opening" but denied known difficulties with intubation.   Last cardiology visit with Dr. Cristal Deer was on 12/04/21 for HLD and family history of heart disease. She was tolerating pravastatin. She was participating in water aerobics 3x/week and occasionally walked with her husband. No chest pain, palpitations, dyspnea, peripheral edema, syncope, presyncope, orthopnea, PND.  She recommended moderate walking routine, heart healthy/Mediterranean diet, avoidance of tobacco products and excessive alcohol.  1 year follow-up planned.  As above, reported small mouth opening, but no known difficult intubation. No documented issues placing esophageal probe for by 03/11/07 EGD report (Media tab). She had a laparoscopically assisted vaginal hysterectomy on 10/27/01 at [facility] (prior [facility] Will attempt to get a copy of anesthesia records from [facility], otherwise anesthesia team to assess on the day of surgery.   ADDENDUM 03/12/22 4:50 PM: 10/27/01 anesthesia records received from [facility]. Intubation records included: Anterior  Easy Stylette used Blade 3 MAC ETT 7.0  Anesthesia team to evaluate on the day of surgery.    VS:  Wt Readings from Last 3 Encounters:  03/07/22 84.1 kg  02/20/22 84.1 kg   12/04/21 83.3 kg   BP Readings from Last 3 Encounters:  03/07/22 (!) 142/73  02/20/22 120/76  12/04/21 126/80   Pulse Readings from Last 3 Encounters:  03/07/22 74  02/20/22 77  12/04/21 67     PROVIDERS: Jarold Motto, PA is PCP  Jodelle Red, MD is cardiologist Coralyn Helling, MD is pulmonologist (OSA) Irena Cords, Molly Maduro, MD is allergist Berniece Salines, MD is urologist   LABS:  Lab Results  Component Value Date   WBC 5.1 03/07/2022   HGB 12.7 03/07/2022   HCT 37.6 03/07/2022   PLT 235 03/07/2022   GLUCOSE 98 03/07/2022   ALT 31 02/20/2022   AST 28 02/20/2022   NA 143 03/07/2022   K 4.0 03/07/2022   CL 109 03/07/2022   CREATININE 0.94 03/07/2022   BUN 24 (H) 03/07/2022   CO2 28 03/07/2022   HGBA1C 5.3 02/20/2022    IMAGES: MRI Left Shoulder 02/14/22: IMPRESSION: 1. Large full-thickness, near complete, tear of the supraspinatus tendon with 24 mm of retraction. 2. Moderate tendinosis of the infraspinatus tendon with a small partial-thickness articular surface tear. 3. Moderate tendinosis of the intra-articular portion of the long head of the biceps tendon.    EKG: 12/04/21: NSR. Low voltage QRS.   CV: N/A  Past Medical History:  Diagnosis Date   AC (acromioclavicular) joint bone spurs    left shoulder with chronic pain, w/limitation ROM   Allergy    weekly allergy shots   Anemia    hx of   Anxiety    Arthritis    Asthma    Depression    Fibrocystic breast disease    GERD (gastroesophageal reflux disease)  OSA (obstructive sleep apnea) 07/31/2013   Peptic ulcer disease    TMJ locking     Past Surgical History:  Procedure Laterality Date   ABDOMINAL HYSTERECTOMY  2003   due to fibroids   ADENOIDECTOMY     x3   BIOPSY  06/29/2018   Procedure: BIOPSY;  Surgeon: Shellia Cleverly, DO;  Location: [facility];  Service: Gastroenterology;;   BREAST CYST ASPIRATION     COLONOSCOPY  03/10/2007   results-normal   COLONOSCOPY  WITH PROPOFOL N/A 06/29/2018   Procedure: COLONOSCOPY WITH PROPOFOL;  Surgeon: Shellia Cleverly, DO;  Location: [facility];  Service: Gastroenterology;  Laterality: N/A;   DILATION AND CURETTAGE OF UTERUS     HAMMER TOE SURGERY     POLYPECTOMY  06/29/2018   Procedure: POLYPECTOMY;  Surgeon: Shellia Cleverly, DO;  Location: [facility];  Service: Gastroenterology;;   PUBOVAGINAL SLING  '90's   for incontinence   RHINOPLASTY  1984   ROTATOR CUFF REPAIR Right 2013   [facility]   ROTATOR CUFF REPAIR Left 2012   [facility]   TONSILLECTOMY     x2   TUBAL LIGATION     TURBINATE REDUCTION  1997   WRIST SURGERY      MEDICATIONS:  0.9 %  sodium chloride infusion    albuterol (VENTOLIN HFA) 108 (90 Base) MCG/ACT inhaler   buPROPion (WELLBUTRIN XL) 300 MG 24 hr tablet   diclofenac Sodium (VOLTAREN) 1 % GEL   EPINEPHrine 0.3 mg/0.3 mL IJ SOAJ injection   fluticasone (FLONASE) 50 MCG/ACT nasal spray   hydroxypropyl methylcellulose / hypromellose (ISOPTO TEARS / GONIOVISC) 2.5 % ophthalmic solution   ipratropium (ATROVENT) 0.03 % nasal spray   Iron-Vitamin C 65-125 MG TABS   levocetirizine (XYZAL) 5 MG tablet   Multiple Vitamin (MULTIVITAMIN) tablet   rosuvastatin (CRESTOR) 5 MG tablet   temazepam (RESTORIL) 30 MG capsule    Shonna Chock, PA-C Surgical Short Stay/Anesthesiology [facility] Phone [phone] [facility] Phone [phone] 03/10/2022 6:30 PM

## 2022-03-10 NOTE — Progress Notes (Incomplete)
error 

## 2022-03-25 ENCOUNTER — Other Ambulatory Visit: Payer: Self-pay

## 2022-03-25 ENCOUNTER — Ambulatory Visit (HOSPITAL_COMMUNITY)
Admission: RE | Admit: 2022-03-25 | Discharge: 2022-03-25 | Disposition: A | Discharge status: Home / Self Care | Payer: Medicare Other | Attending: Orthopedic Surgery | Admitting: Orthopedic Surgery

## 2022-03-25 ENCOUNTER — Ambulatory Visit (HOSPITAL_COMMUNITY): Payer: Medicare Other | Admitting: Physician Assistant

## 2022-03-25 ENCOUNTER — Ambulatory Visit (HOSPITAL_BASED_OUTPATIENT_CLINIC_OR_DEPARTMENT_OTHER): Payer: Medicare Other | Admitting: Physician Assistant

## 2022-03-25 ENCOUNTER — Encounter (HOSPITAL_COMMUNITY)
Admission: RE | Disposition: A | Discharge status: Home / Self Care | Payer: Self-pay | Source: Home / Self Care | Attending: Orthopedic Surgery

## 2022-03-25 ENCOUNTER — Encounter (HOSPITAL_COMMUNITY): Payer: Self-pay | Admitting: Orthopedic Surgery

## 2022-03-25 DIAGNOSIS — E785 Hyperlipidemia, unspecified: Secondary | ICD-10-CM | POA: Diagnosis not present

## 2022-03-25 DIAGNOSIS — S43432A Superior glenoid labrum lesion of left shoulder, initial encounter: Secondary | ICD-10-CM

## 2022-03-25 DIAGNOSIS — M75122 Complete rotator cuff tear or rupture of left shoulder, not specified as traumatic: Secondary | ICD-10-CM | POA: Diagnosis not present

## 2022-03-25 DIAGNOSIS — F32A Depression, unspecified: Secondary | ICD-10-CM | POA: Insufficient documentation

## 2022-03-25 DIAGNOSIS — Z01818 Encounter for other preprocedural examination: Secondary | ICD-10-CM

## 2022-03-25 DIAGNOSIS — M75112 Incomplete rotator cuff tear or rupture of left shoulder, not specified as traumatic: Secondary | ICD-10-CM | POA: Insufficient documentation

## 2022-03-25 DIAGNOSIS — M1990 Unspecified osteoarthritis, unspecified site: Secondary | ICD-10-CM | POA: Insufficient documentation

## 2022-03-25 DIAGNOSIS — G4733 Obstructive sleep apnea (adult) (pediatric): Secondary | ICD-10-CM | POA: Insufficient documentation

## 2022-03-25 DIAGNOSIS — J45909 Unspecified asthma, uncomplicated: Secondary | ICD-10-CM | POA: Diagnosis not present

## 2022-03-25 DIAGNOSIS — M65812 Other synovitis and tenosynovitis, left shoulder: Secondary | ICD-10-CM

## 2022-03-25 DIAGNOSIS — Z87891 Personal history of nicotine dependence: Secondary | ICD-10-CM | POA: Insufficient documentation

## 2022-03-25 DIAGNOSIS — M7522 Bicipital tendinitis, left shoulder: Secondary | ICD-10-CM | POA: Insufficient documentation

## 2022-03-25 DIAGNOSIS — F419 Anxiety disorder, unspecified: Secondary | ICD-10-CM | POA: Diagnosis not present

## 2022-03-25 DIAGNOSIS — M75101 Unspecified rotator cuff tear or rupture of right shoulder, not specified as traumatic: Secondary | ICD-10-CM

## 2022-03-25 HISTORY — PX: BICEPT TENODESIS: SHX5116

## 2022-03-25 HISTORY — PX: SHOULDER ARTHROSCOPY WITH ROTATOR CUFF REPAIR AND SUBACROMIAL DECOMPRESSION: SHX5686

## 2022-03-25 SURGERY — SHOULDER ARTHROSCOPY WITH ROTATOR CUFF REPAIR AND SUBACROMIAL DECOMPRESSION
Anesthesia: General | Site: Shoulder | Laterality: Left

## 2022-03-25 MED ORDER — ROCURONIUM BROMIDE 10 MG/ML (PF) SYRINGE
PREFILLED_SYRINGE | INTRAVENOUS | Status: AC
  Filled 2022-03-25: qty 40

## 2022-03-25 MED ORDER — EPHEDRINE 5 MG/ML INJ
INTRAVENOUS | Status: AC
  Filled 2022-03-25: qty 5

## 2022-03-25 MED ORDER — ACETAMINOPHEN 500 MG PO TABS
1000.0000 mg | ORAL_TABLET | Freq: Once | ORAL | Status: AC
  Administered 2022-03-25: 1000 mg via ORAL

## 2022-03-25 MED ORDER — EPINEPHRINE PF 1 MG/ML IJ SOLN
INTRAMUSCULAR | Status: AC
  Filled 2022-03-25: qty 2

## 2022-03-25 MED ORDER — LIDOCAINE 2% (20 MG/ML) 5 ML SYRINGE
INTRAMUSCULAR | Status: AC
  Filled 2022-03-25: qty 15

## 2022-03-25 MED ORDER — CELECOXIB 100 MG PO CAPS: 100.0000 mg | ORAL_CAPSULE | Freq: Two times a day (BID) | ORAL | 0 refills | Status: DC

## 2022-03-25 MED ORDER — FENTANYL CITRATE (PF) 100 MCG/2ML IJ SOLN: 25.0000 ug | INTRAMUSCULAR | Status: DC | PRN

## 2022-03-25 MED ORDER — MIDAZOLAM HCL 2 MG/2ML IJ SOLN
INTRAMUSCULAR | Status: DC | PRN
  Administered 2022-03-25: 2 mg via INTRAVENOUS

## 2022-03-25 MED ORDER — ONDANSETRON HCL 4 MG/2ML IJ SOLN
INTRAMUSCULAR | Status: AC
  Filled 2022-03-25: qty 10

## 2022-03-25 MED ORDER — VANCOMYCIN HCL IN DEXTROSE 1-5 GM/200ML-% IV SOLN
INTRAVENOUS | Status: AC
  Administered 2022-03-25: 1000 mg via INTRAVENOUS
  Filled 2022-03-25: qty 200

## 2022-03-25 MED ORDER — SODIUM CHLORIDE 0.9 % IR SOLN
Status: DC | PRN
  Administered 2022-03-25 (×2): 3001 mL

## 2022-03-25 MED ORDER — POVIDONE-IODINE 7.5 % EX SOLN
Freq: Once | CUTANEOUS | Status: DC
  Filled 2022-03-25: qty 118

## 2022-03-25 MED ORDER — CHLORHEXIDINE GLUCONATE 0.12 % MT SOLN: 15.0000 mL | Freq: Once | OROMUCOSAL | Status: AC

## 2022-03-25 MED ORDER — DEXAMETHASONE SODIUM PHOSPHATE 10 MG/ML IJ SOLN
INTRAMUSCULAR | Status: DC | PRN
  Administered 2022-03-25: 10 mg via INTRAVENOUS

## 2022-03-25 MED ORDER — ONDANSETRON HCL 4 MG/2ML IJ SOLN
INTRAMUSCULAR | Status: DC | PRN
  Administered 2022-03-25: 4 mg via INTRAVENOUS

## 2022-03-25 MED ORDER — MIDAZOLAM HCL 2 MG/2ML IJ SOLN: 1.0000 mg | Freq: Once | INTRAMUSCULAR | Status: AC

## 2022-03-25 MED ORDER — 0.9 % SODIUM CHLORIDE (POUR BTL) OPTIME
TOPICAL | Status: DC | PRN
  Administered 2022-03-25: 1000 mL

## 2022-03-25 MED ORDER — ORAL CARE MOUTH RINSE: 15.0000 mL | Freq: Once | OROMUCOSAL | Status: AC

## 2022-03-25 MED ORDER — TRANEXAMIC ACID-NACL 1000-0.7 MG/100ML-% IV SOLN
INTRAVENOUS | Status: AC
  Filled 2022-03-25: qty 100

## 2022-03-25 MED ORDER — VANCOMYCIN HCL IN DEXTROSE 1-5 GM/200ML-% IV SOLN: 1000.0000 mg | INTRAVENOUS | Status: AC

## 2022-03-25 MED ORDER — LACTATED RINGERS IV SOLN: INTRAVENOUS | Status: DC

## 2022-03-25 MED ORDER — DEXAMETHASONE SODIUM PHOSPHATE 10 MG/ML IJ SOLN
INTRAMUSCULAR | Status: AC
  Filled 2022-03-25: qty 5

## 2022-03-25 MED ORDER — FENTANYL CITRATE (PF) 100 MCG/2ML IJ SOLN
INTRAMUSCULAR | Status: AC
  Administered 2022-03-25: 50 ug via INTRAVENOUS
  Filled 2022-03-25: qty 2

## 2022-03-25 MED ORDER — MIDAZOLAM HCL 2 MG/2ML IJ SOLN
INTRAMUSCULAR | Status: AC
  Administered 2022-03-25: 1 mg via INTRAVENOUS
  Filled 2022-03-25: qty 2

## 2022-03-25 MED ORDER — OXYCODONE-ACETAMINOPHEN 5-325 MG PO TABS: 1.0000 | ORAL_TABLET | ORAL | 0 refills | Status: DC | PRN

## 2022-03-25 MED ORDER — METHOCARBAMOL 500 MG PO TABS: 500.0000 mg | ORAL_TABLET | Freq: Three times a day (TID) | ORAL | 1 refills | Status: DC | PRN

## 2022-03-25 MED ORDER — OXYCODONE HCL 5 MG PO TABS: 5.0000 mg | ORAL_TABLET | Freq: Once | ORAL | Status: DC | PRN

## 2022-03-25 MED ORDER — PROPOFOL 10 MG/ML IV BOLUS
INTRAVENOUS | Status: DC | PRN
  Administered 2022-03-25: 150 mg via INTRAVENOUS
  Administered 2022-03-25: 50 mg via INTRAVENOUS

## 2022-03-25 MED ORDER — OXYCODONE HCL 5 MG/5ML PO SOLN: 5.0000 mg | Freq: Once | ORAL | Status: DC | PRN

## 2022-03-25 MED ORDER — PROPOFOL 10 MG/ML IV BOLUS
INTRAVENOUS | Status: AC
  Filled 2022-03-25: qty 20

## 2022-03-25 MED ORDER — POVIDONE-IODINE 10 % EX SWAB
2.0000 | Freq: Once | CUTANEOUS | Status: AC
  Administered 2022-03-25: 2 via TOPICAL

## 2022-03-25 MED ORDER — CHLORHEXIDINE GLUCONATE 0.12 % MT SOLN
OROMUCOSAL | Status: AC
  Administered 2022-03-25: 15 mL via OROMUCOSAL
  Filled 2022-03-25: qty 15

## 2022-03-25 MED ORDER — TRANEXAMIC ACID-NACL 1000-0.7 MG/100ML-% IV SOLN
1000.0000 mg | INTRAVENOUS | Status: AC
  Administered 2022-03-25 (×2): 1000 mg via INTRAVENOUS

## 2022-03-25 MED ORDER — FENTANYL CITRATE (PF) 250 MCG/5ML IJ SOLN
INTRAMUSCULAR | Status: AC
  Filled 2022-03-25: qty 5

## 2022-03-25 MED ORDER — PHENYLEPHRINE HCL-NACL 20-0.9 MG/250ML-% IV SOLN
INTRAVENOUS | Status: DC | PRN
  Administered 2022-03-25: 30 ug/min via INTRAVENOUS

## 2022-03-25 MED ORDER — MIDAZOLAM HCL 2 MG/2ML IJ SOLN
INTRAMUSCULAR | Status: AC
  Filled 2022-03-25: qty 2

## 2022-03-25 MED ORDER — ACETAMINOPHEN 500 MG PO TABS
ORAL_TABLET | ORAL | Status: AC
  Filled 2022-03-25: qty 2

## 2022-03-25 MED ORDER — ONDANSETRON HCL 4 MG/2ML IJ SOLN
INTRAMUSCULAR | Status: AC
  Filled 2022-03-25: qty 2

## 2022-03-25 MED ORDER — LIDOCAINE 2% (20 MG/ML) 5 ML SYRINGE
INTRAMUSCULAR | Status: DC | PRN
  Administered 2022-03-25: 100 mg via INTRAVENOUS

## 2022-03-25 MED ORDER — ROCURONIUM BROMIDE 10 MG/ML (PF) SYRINGE
PREFILLED_SYRINGE | INTRAVENOUS | Status: DC | PRN
  Administered 2022-03-25: 20 mg via INTRAVENOUS
  Administered 2022-03-25: 60 mg via INTRAVENOUS

## 2022-03-25 MED ORDER — PROPOFOL 500 MG/50ML IV EMUL
INTRAVENOUS | Status: DC | PRN
  Administered 2022-03-25: 50 ug/kg/min via INTRAVENOUS

## 2022-03-25 MED ORDER — FENTANYL CITRATE (PF) 250 MCG/5ML IJ SOLN
INTRAMUSCULAR | Status: DC | PRN
  Administered 2022-03-25: 50 ug via INTRAVENOUS

## 2022-03-25 MED ORDER — FENTANYL CITRATE (PF) 100 MCG/2ML IJ SOLN: 50.0000 ug | Freq: Once | INTRAMUSCULAR | Status: AC

## 2022-03-25 SURGICAL SUPPLY — 89 items
ALCOHOL 70% 16 OZ (MISCELLANEOUS) ×1 IMPLANT
ANCH BUTTON FIBERTAK KNEE SP (Anchor) IMPLANT
ANCHOR SUT 1.8 FIBERTAK SB KL (Anchor) IMPLANT
ANCHOR SUT BIOCOMP CORKSREW (Anchor) IMPLANT
ANCHOR SUT FBRTK 2.6X1.7X2 (Anchor) IMPLANT
ANCHOR SUT KL SWIVELCK5.5X19.1 (Orthopedic Implant) IMPLANT
ANCHR SUT KL SWIVELCK 5.5X19.1 (Orthopedic Implant) ×2 IMPLANT
BAG COUNTER SPONGE SURGICOUNT (BAG) ×1 IMPLANT
BLADE CUTTER GATOR 3.5 (BLADE) IMPLANT
BLADE EXCALIBUR 4.0X13 (MISCELLANEOUS) IMPLANT
BLADE SURG 11 STRL SS (BLADE) IMPLANT
BUR OVAL 6.0 (BURR) IMPLANT
CLSR STERI-STRIP ANTIMIC 1/2X4 (GAUZE/BANDAGES/DRESSINGS) IMPLANT
COOLER ICEMAN CLASSIC (MISCELLANEOUS) IMPLANT
COVER SURGICAL LIGHT HANDLE (MISCELLANEOUS) ×1 IMPLANT
DRAPE INCISE IOBAN 66X45 STRL (DRAPES) ×2 IMPLANT
DRAPE STERI 35X30 U-POUCH (DRAPES) ×1 IMPLANT
DRAPE U-SHAPE 47X51 STRL (DRAPES) ×2 IMPLANT
DRSG TEGADERM 4X4.75 (GAUZE/BANDAGES/DRESSINGS) ×3 IMPLANT
DURAPREP 26ML APPLICATOR (WOUND CARE) ×1 IMPLANT
ELECT REM PT RETURN 9FT ADLT (ELECTROSURGICAL) ×1
ELECTRODE REM PT RTRN 9FT ADLT (ELECTROSURGICAL) ×1 IMPLANT
FILTER STRAW FLUID ASPIR (MISCELLANEOUS) ×1 IMPLANT
GAUZE SPONGE 4X4 12PLY STRL (GAUZE/BANDAGES/DRESSINGS) ×1 IMPLANT
GAUZE XEROFORM 1X8 LF (GAUZE/BANDAGES/DRESSINGS) ×1 IMPLANT
GLOVE BIOGEL PI IND STRL 7.0 (GLOVE) ×1 IMPLANT
GLOVE BIOGEL PI IND STRL 8 (GLOVE) ×1 IMPLANT
GLOVE ECLIPSE 7.0 STRL STRAW (GLOVE) ×1 IMPLANT
GLOVE ECLIPSE 8.0 STRL XLNG CF (GLOVE) ×1 IMPLANT
GOWN STRL REUS W/ TWL LRG LVL3 (GOWN DISPOSABLE) ×3 IMPLANT
GOWN STRL REUS W/TWL LRG LVL3 (GOWN DISPOSABLE) ×3
HYDROGEN PEROXIDE 16OZ (MISCELLANEOUS) ×1 IMPLANT
KIT BASIN OR (CUSTOM PROCEDURE TRAY) ×1 IMPLANT
KIT STR SPEAR 1.8 FBRTK DISP (KITS) IMPLANT
KIT TURNOVER KIT B (KITS) ×1 IMPLANT
MANIFOLD NEPTUNE II (INSTRUMENTS) ×1 IMPLANT
NDL 18GX1X1/2 (RX/OR ONLY) (NEEDLE) IMPLANT
NDL FILTER BLUNT 18X1 1/2 (NEEDLE) IMPLANT
NDL HD SCORPION MEGA LOADER (NEEDLE) IMPLANT
NDL HYPO 25X1 1.5 SAFETY (NEEDLE) ×1 IMPLANT
NDL SCORPION MULTI FIRE (NEEDLE) IMPLANT
NDL SPNL 18GX3.5 QUINCKE PK (NEEDLE) ×1 IMPLANT
NDL SUT 6 .5 CRC .975X.05 MAYO (NEEDLE) ×1 IMPLANT
NDL TAPERED W/ NITINOL LOOP (MISCELLANEOUS) IMPLANT
NEEDLE 18GX1X1/2 (RX/OR ONLY) (NEEDLE) ×1 IMPLANT
NEEDLE FILTER BLUNT 18X1 1/2 (NEEDLE) ×1 IMPLANT
NEEDLE HYPO 25X1 1.5 SAFETY (NEEDLE) ×1 IMPLANT
NEEDLE MAYO TAPER (NEEDLE)
NEEDLE SCORPION MULTI FIRE (NEEDLE) IMPLANT
NEEDLE SPNL 18GX3.5 QUINCKE PK (NEEDLE) ×1 IMPLANT
NEEDLE TAPERED W/ NITINOL LOOP (MISCELLANEOUS) ×2 IMPLANT
NS IRRIG 1000ML POUR BTL (IV SOLUTION) ×1 IMPLANT
PACK SHOULDER (CUSTOM PROCEDURE TRAY) ×1 IMPLANT
PAD ARMBOARD 7.5X6 YLW CONV (MISCELLANEOUS) ×2 IMPLANT
PAD COLD SHLDR WRAP-ON (PAD) IMPLANT
PORT APPOLLO RF 90DEGREE MULTI (SURGICAL WAND) IMPLANT
RESTRAINT HEAD UNIVERSAL NS (MISCELLANEOUS) ×1 IMPLANT
SLING ARM IMMOBILIZER LRG (SOFTGOODS) IMPLANT
SOL PREP POV-IOD 4OZ 10% (MISCELLANEOUS) ×2 IMPLANT
SPONGE T-LAP 4X18 ~~LOC~~+RFID (SPONGE) ×1 IMPLANT
STRIP CLOSURE SKIN 1/2X4 (GAUZE/BANDAGES/DRESSINGS) ×1 IMPLANT
SUCTION FRAZIER HANDLE 10FR (MISCELLANEOUS)
SUCTION TUBE FRAZIER 10FR DISP (MISCELLANEOUS) ×1 IMPLANT
SUT 0 FIBERLOOP 38 BLUE TPR ND (SUTURE) ×1
SUT 2 FIBERLOOP 20 STRT BLUE (SUTURE) ×1
SUT ETHILON 3 0 PS 1 (SUTURE) ×2 IMPLANT
SUT FIBERWIRE #2 38 T-5 BLUE (SUTURE)
SUT MNCRL AB 3-0 PS2 18 (SUTURE) ×1 IMPLANT
SUT VIC AB 0 CT1 27 (SUTURE) ×1
SUT VIC AB 0 CT1 27XBRD ANBCTR (SUTURE) ×2 IMPLANT
SUT VIC AB 1 CT1 27 (SUTURE) ×2
SUT VIC AB 1 CT1 27XBRD ANBCTR (SUTURE) ×1 IMPLANT
SUT VIC AB 2-0 CT1 27 (SUTURE) ×2
SUT VIC AB 2-0 CT1 TAPERPNT 27 (SUTURE) ×1 IMPLANT
SUT VICRYL 0 UR6 27IN ABS (SUTURE) ×1 IMPLANT
SUTURE 0 FIBERLP 38 BLU TPR ND (SUTURE) IMPLANT
SUTURE 2 FIBERLOOP 20 STRT BLU (SUTURE) IMPLANT
SUTURE FIBERWR #2 38 T-5 BLUE (SUTURE) IMPLANT
SUTURE TAPE 1.3 40 TPR END (SUTURE) IMPLANT
SUTURETAPE 1.3 40 TPR END (SUTURE) ×3
SYR 20ML LL LF (SYRINGE) ×2 IMPLANT
SYR 30ML LL (SYRINGE) ×1 IMPLANT
SYR 3ML LL SCALE MARK (SYRINGE) IMPLANT
SYR TB 1ML LUER SLIP (SYRINGE) ×1 IMPLANT
TOWEL GREEN STERILE (TOWEL DISPOSABLE) ×1 IMPLANT
TOWEL GREEN STERILE FF (TOWEL DISPOSABLE) ×1 IMPLANT
TUBING ARTHROSCOPY IRRIG 16FT (MISCELLANEOUS) ×1 IMPLANT
WAND ABLATOR APOLLO I90 (BUR) IMPLANT
WATER STERILE IRR 1000ML POUR (IV SOLUTION) ×1 IMPLANT

## 2022-03-25 NOTE — H&P (Signed)
Chelsey Jackson is an 71 y.o. female.   Chief Complaint: Left shoulder pain HPI: Chelsey Jackson is a 71 y.o. female who presents with left shoulder pain.  She reports pain as well as weakness.  Hurts her to roll over.  She has had 2 years of symptoms.  She has a history of prior surgery on that left shoulder which sounds like it was more of an arthroscopic debridement type procedure.  MRI scan shows no anchors in the humeral head.  MRI scan also shows a large full-thickness near complete tear of the supraspinatus tendon with 24 mm of retraction along with moderate tendinosis of the long head of the biceps tendon.   Past Medical History:  Diagnosis Date   AC (acromioclavicular) joint bone spurs    left shoulder with chronic pain, w/limitation ROM   Allergy    weekly allergy shots   Anemia    hx of   Anxiety    Arthritis    Asthma    Depression    Fibrocystic breast disease    GERD (gastroesophageal reflux disease)    OSA (obstructive sleep apnea) 07/31/2013   Peptic ulcer disease    TMJ locking     Past Surgical History:  Procedure Laterality Date   ABDOMINAL HYSTERECTOMY  2003   due to fibroids   ADENOIDECTOMY     x3   BIOPSY  06/29/2018   Procedure: BIOPSY;  Surgeon: Lavena Bullion, DO;  Location: [facility];  Service: Gastroenterology;;   BREAST CYST ASPIRATION     COLONOSCOPY  03/10/2007   results-normal   COLONOSCOPY WITH PROPOFOL N/A 06/29/2018   Procedure: COLONOSCOPY WITH PROPOFOL;  Surgeon: Lavena Bullion, DO;  Location: [facility];  Service: Gastroenterology;  Laterality: N/A;   DILATION AND CURETTAGE OF UTERUS     HAMMER TOE SURGERY     POLYPECTOMY  06/29/2018   Procedure: POLYPECTOMY;  Surgeon: Lavena Bullion, DO;  Location: [facility];  Service: Gastroenterology;;   PUBOVAGINAL SLING  '90's   for incontinence   [place] Right 2013   [facility]   ROTATOR CUFF REPAIR Left 2012   [facility]   TONSILLECTOMY      x2   TUBAL LIGATION     TURBINATE REDUCTION  1997   [facility] SURGERY      Family History  Problem Relation Age of Onset   Osteoporosis Mother    Fibroids Mother    Other Mother        aortic valve replacement 1994, abdominal aortic aneurysm   Lung cancer Mother 56   Heart disease Father    Testicular cancer Father 20   Prostate cancer Father 30   Leukemia Father        CLL   Diabetes Maternal Grandmother    Heart disease Maternal Grandfather    Heart disease Paternal Grandmother    Diabetes Paternal Grandfather    Stomach cancer Paternal Grandfather    Breast cancer Paternal Aunt    Breast cancer Cousin    Colon cancer Neg Hx    Throat cancer Neg Hx    Pancreatic cancer Neg Hx    Social History:  reports that she quit smoking about 32 years ago. Her smoking use included cigarettes. She has a 30.00 pack-year smoking history. She has never used smokeless tobacco. She reports current alcohol use. She reports that she does not use drugs.  Allergies:  Allergies  Allergen Reactions   Penicillins Anaphylaxis  REACTION: stop breathing    Facility-Administered Medications Prior to Admission  Medication Dose Route Frequency Provider Last Rate Last Admin   0.9 %  sodium chloride infusion  500 mL Intravenous Once Thornton Park, MD       Medications Prior to Admission  Medication Sig Dispense Refill   buPROPion (WELLBUTRIN XL) 300 MG 24 hr tablet TAKE 1 TABLET BY MOUTH DAILY 90 tablet 0   EPINEPHrine 0.3 mg/0.3 mL IJ SOAJ injection Inject 0.3 mg into the muscle as needed for anaphylaxis.     fluticasone (FLONASE) 50 MCG/ACT nasal spray Place 2 sprays into both nostrils at bedtime.     hydroxypropyl methylcellulose / hypromellose (ISOPTO TEARS / GONIOVISC) 2.5 % ophthalmic solution Place 1 drop into both eyes as needed for dry eyes.     ipratropium (ATROVENT) 0.03 % nasal spray Place 2 sprays into both nostrils daily.     Iron-Vitamin C 65-125 MG TABS Take 1 tablet by mouth  every evening.     levocetirizine (XYZAL) 5 MG tablet Take 5 mg by mouth every evening.     Multiple Vitamin (MULTIVITAMIN) tablet Take 1 tablet by mouth daily.     rosuvastatin (CRESTOR) 5 MG tablet TAKE ONE TABLET BY MOUTH DAILY 90 tablet 1   temazepam (RESTORIL) 30 MG capsule Take 1 capsule (30 mg total) by mouth at bedtime. 90 capsule 1   albuterol (VENTOLIN HFA) 108 (90 Base) MCG/ACT inhaler Inhale 1-2 puffs into the lungs every 6 (six) hours as needed for wheezing or shortness of breath. 18 g 2   diclofenac Sodium (VOLTAREN) 1 % GEL Apply 2 g topically daily as needed (pain).      No results found for this or any previous visit (from the past 48 hour(s)). No results found.  Review of Systems  Musculoskeletal:  Positive for arthralgias.  All other systems reviewed and are negative.   Blood pressure (!) 187/83, pulse 64, temperature 97.9 F (36.6 C), resp. rate 14, height '5\' 8"'$  (1.727 m), weight 83.9 kg, SpO2 93 %. Physical Exam Vitals reviewed.  HENT:     Head: Normocephalic.     Nose: Nose normal.     Mouth/Throat:     Mouth: Mucous membranes are moist.  Eyes:     Pupils: Pupils are equal, round, and reactive to light.  Cardiovascular:     Rate and Rhythm: Normal rate.     Pulses: Normal pulses.  Pulmonary:     Effort: Pulmonary effort is normal.  Abdominal:     General: Abdomen is flat.  Musculoskeletal:     Cervical back: Normal range of motion.  Skin:    General: Skin is warm.     Capillary Refill: Capillary refill takes less than 2 seconds.  Neurological:     General: No focal deficit present.     Mental Status: She is alert.  Psychiatric:        Mood and Affect: Mood normal.    Ortho exam demonstrates on that left-hand side passive range of motion of 70/95/170. She does have some weakness to supraspinatus testing on the left compared to the right. Does have positive tenderness to palpation over the bicipital groove. Positive speeds test. Does have a little  coarseness with internal/external rotation at 90 degrees of abduction but that localizes to the anterolateral aspect of the shoulde   Assessment/Plan Impression is rotator cuff tear left shoulder symptomatic for 2 years with failure of conservative management including injections as well as exercises and  therapy. Patient has daily symptoms. Decision point today was for or against rotator cuff repair versus reverse shoulder replacement. In the absence of significant arthritis in the glenohumeral joint and with moderate retraction of the rotator cuff tear I think an attempt at rotator cuff repair is indicated. No real atrophy in the supraspinatus muscle belly is present. I think the biceps tendon could be a big pain generator in the shoulder as well. No evidence on exam that she has adhesive capsulitis. Plan at this time would be arthroscopy with biceps tendon release superior labral debridement biceps tenodesis and mini open rotator cuff tendon repair. Would have to adjust anchor placement techniques based on bone quality. The risk and benefits of the procedure discussed with the patient including not limited to infection or vessel damage shoulder stiffness incomplete pain relief as well as incomplete restoration of function. Patient understands risk and benefits and wishes to proceed. All questions answere   Anderson Malta, MD 03/25/2022, 2:36 PM

## 2022-03-25 NOTE — Anesthesia Procedure Notes (Signed)
Anesthesia Regional Block: Interscalene brachial plexus block   Pre-Anesthetic Checklist: , timeout performed,  Correct Patient, Correct Site, Correct Laterality,  Correct Procedure, Correct Position, site marked,  Risks and benefits discussed,  Pre-op evaluation,  At surgeon's request and post-op pain management  Laterality: Left  Prep: Maximum Sterile Barrier Precautions used, chloraprep       Needles:  Injection technique: Single-shot  Needle Type: Echogenic Stimulator Needle     Needle Length: 4cm  Needle Gauge: 22     Additional Needles:   Procedures:,,,, ultrasound used (permanent image in chart),,    Narrative:  Start time: 03/25/2022 2:23 PM End time: 03/25/2022 2:26 PM Injection made incrementally with aspirations every 5 mL.  Performed by: Personally  Anesthesiologist: Brennan Bailey, MD  Additional Notes: Risks, benefits, and alternative discussed. Patient gave consent for procedure. Patient prepped and draped in sterile fashion. Sedation administered, patient remains easily responsive to voice. Relevant anatomy identified with ultrasound guidance. Local anesthetic given in 5cc increments with no signs or symptoms of intravascular injection. No pain or paraesthesias with injection. Patient monitored throughout procedure with signs of LAST or immediate complications. Tolerated well. Ultrasound image placed in chart.  Tawny Asal, MD

## 2022-03-25 NOTE — Transfer of Care (Signed)
Immediate Anesthesia Transfer of Care Note  Patient: Chelsey Jackson  Procedure(s) Performed: LEFT SHOULDER ARTHROSCOPY, SUBACROMIAL DECOMPRESSION, DEBRIDEMENT, MINI OPEN ROTATOR CUFF TEAR REPAIR (Left: Shoulder) BICEPS TENODESIS (Left: Shoulder)  Patient Location: PACU  Anesthesia Type:GA combined with regional for post-op pain  Level of Consciousness: awake and alert   Airway & Oxygen Therapy: Patient Spontanous Breathing and Patient connected to nasal cannula oxygen  Post-op Assessment: Report given to RN and Post -op Vital signs reviewed and stable  Post vital signs: Reviewed and stable  Last Vitals:  Vitals Value Taken Time  BP 176/82 03/25/22 1810  Temp 36.5 C 03/25/22 1810  Pulse 67 03/25/22 1823  Resp 19 03/25/22 1823  SpO2 94 % 03/25/22 1823  Vitals shown include unvalidated device data.  Last Pain:  Vitals:   03/25/22 1810  PainSc: Asleep         Complications:  Encounter Notable Events  Notable Event Outcome Phase Comment  Difficult to intubate - expected  Intraprocedure Filed from anesthesia note documentation.

## 2022-03-25 NOTE — Brief Op Note (Signed)
   03/25/2022  5:48 PM  PATIENT:  [name]  71 y.o. female  PRE-OPERATIVE DIAGNOSIS:  left shoulder biceps tendinitis, rotator cuff tear  POST-OPERATIVE DIAGNOSIS:  left shoulder biceps tendinitis, rotator cuff tear  PROCEDURE:  Procedure(s): LEFT SHOULDER ARTHROSCOPY, SUBACROMIAL DECOMPRESSION, DEBRIDEMENT, MINI OPEN ROTATOR CUFF TEAR REPAIR BICEPS TENODESIS  SURGEON:  Surgeon(s): Marlou Sa, Tonna Corner, MD  ASSISTANT: Annie Main, PA  ANESTHESIA:   General  EBL: 25 ml    Total I/O In: 1600 [I.V.:1600] Out: -   BLOOD ADMINISTERED: none  DRAINS: None  LOCAL MEDICATIONS USED:  none  SPECIMEN:  No Specimen  COUNTS:  YES  TOURNIQUET:  * No tourniquets in log *  DICTATION: .Other Dictation: Dictation Number [id number]  PLAN OF CARE: Discharge to home after PACU  PATIENT DISPOSITION:  PACU - hemodynamically stable

## 2022-03-25 NOTE — Anesthesia Procedure Notes (Signed)
Procedure Name: Intubation Date/Time: 03/25/2022 3:30 PM  Performed by: Minerva Ends, CRNAPre-anesthesia Checklist: Patient identified, Emergency Drugs available, Suction available and Patient being monitored Patient Re-evaluated:Patient Re-evaluated prior to induction Oxygen Delivery Method: Circle system utilized Preoxygenation: Pre-oxygenation with 100% oxygen Induction Type: IV induction Ventilation: Mask ventilation without difficulty Laryngoscope Size: Mac, 3 and Glidescope Grade View: Grade I Tube type: Oral Tube size: 7.0 mm Number of attempts: 3 Airway Equipment and Method: Stylet and Oral airway Placement Confirmation: ETT inserted through vocal cords under direct vision, positive ETCO2 and breath sounds checked- equal and bilateral Secured at: 23 cm Tube secured with: Tape Dental Injury: Teeth and Oropharynx as per pre-operative assessment  Difficulty Due To: Difficulty was anticipated Comments: Small mouth opening Attempt 1 mac 3 grade 3 view Attempt 2 miller 2 grade 3 view Attempt 3 mac 3 glidescope successful intubation

## 2022-03-26 NOTE — Anesthesia Postprocedure Evaluation (Signed)
Anesthesia Post Note  Patient: Chelsey Jackson  Procedure(s) Performed: LEFT SHOULDER ARTHROSCOPY, SUBACROMIAL DECOMPRESSION, DEBRIDEMENT, MINI OPEN ROTATOR CUFF TEAR REPAIR (Left: Shoulder) BICEPS TENODESIS (Left: Shoulder)     Patient location during evaluation: PACU Anesthesia Type: General Level of consciousness: awake and alert Pain management: pain level controlled Vital Signs Assessment: post-procedure vital signs reviewed and stable Respiratory status: spontaneous breathing, nonlabored ventilation and respiratory function stable Cardiovascular status: stable and blood pressure returned to baseline Anesthetic complications: yes   Encounter Notable Events  Notable Event Outcome Phase Comment  Difficult to intubate - expected  Intraprocedure Filed from anesthesia note documentation.    Last Vitals:  Vitals:   03/25/22 1825 03/25/22 1840  BP: (!) 142/87 (!) 151/87  Pulse: 65 61  Resp: 13 13  Temp:  36.9 C  SpO2: 96% 93%    Last Pain:  Vitals:   03/25/22 1840  PainSc: 0-No pain                 Audry Pili

## 2022-03-27 ENCOUNTER — Telehealth: Payer: Self-pay

## 2022-03-27 NOTE — Op Note (Signed)
NAME: Chelsey Jackson, Chelsey A. MEDICAL RECORD NO: [id number] ACCOUNT NO: [id number] DATE OF BIRTH: [birth year] FACILITY: [facility] LOCATION: [facility] PHYSICIAN: Yetta Barre. Marlou Sa, MD  Operative Report   DATE OF PROCEDURE: 03/25/2022  PREOPERATIVE DIAGNOSES:  Left shoulder rotator cuff tear and biceps tendinitis.  POSTOPERATIVE DIAGNOSES:  Left shoulder rotator cuff tear and biceps tendinitis.  PROCEDURE:  Left shoulder arthroscopy with superior labral debridement, biceps tendon release, biceps tenodesis and mini open rotator cuff tear repair.  SURGEON:  Attending, Tonna Corner Saranda Legrande, MD  ASSISTANT:  Annie Main, PA  INDICATIONS:  The patient is a 71 year old patient with retracted rotator cuff tear who presents for operative management after explanation of risks and benefits.  DESCRIPTION OF PROCEDURE:  The patient was brought to the operating room where general anesthetic was induced.  Preoperative antibiotics administered.  Timeout was called.  The patient was placed in the beach chair position with head in neutral position.   Left shoulder, arm and hand pre-scrubbed with alcohol and Betadine, allowed to air dry.  Prepped with DuraPrep solution and draped in a sterile manner.  Ioban used to seal the operative field and cover the axilla.  After calling timeout, posterior  portal was created.  Anterior portal created under direct visualization.  Diagnostic arthroscopy was performed.  The patient had retracted rotator cuff tear of the supraspinatus, which was more of an A-shaped type tear.  Essentially appeared to be a  cleft with some retraction.  The patient also had significant biceps tendinitis.  Biceps tendon was released from the superior labrum and the superior labrum was debrided with the Arthrocare wand.  Glenohumeral articular surfaces otherwise intact.   Intra-articular subscap intact.  Intra-articular anterior inferior glenohumeral ligament and posterior inferior glenohumeral ligament intact.   Next, the left shoulder portals were closed.  Ioban used to cover the operative field.  Incision made off the  anterolateral margin of the acromion.  Skin and subcutaneous tissues were sharply divided.  Raphae between the anterior and middle deltoid was incised, a measured distance of 4 cm marked with #1 Vicryl suture.  Bursectomy performed.  Biceps tendon  tenodesed under appropriate tension after placing a FiberLoop wire in the biceps tendon and using knotless SutureTak in the inferior aspect of the bicipital groove as well as a knotless suture anchor in the superior aspect of the groove.  The tendon was  oversewn with two 0 Vicryl sutures.  Now at this time, the attention was directed towards the rotator cuff tear.  Acromioplasty performed with a rasp.  The rotator cuff tear was significant cleft with some retraction between the anterior portion of the  supraspinatus involving the infraspinatus as well. All in all, the tear measured about 4 cm in length and 1.5 cm in width.  The bursal type tissue was debrided around the rotator cuff tear.  This was more of a side-to-side repair.  Debridement was  performed back to healthy appearing tissue.  Side-to-side closure using 3 suture tapes were placed with the Scorpion to close the most medial aspect of the rotator cuff tear.  The infraspinatus had significant degeneration and this was debrided. An Arthrex knotless  suture anchor was placed in the posterior aspect of the footprint and the knotless mechanism was used to anchor this portion of the rotator cuff down and two other suture tapes from that anchor were placed into the rotator cuff tendon, infraspinatus and  then mobilized, which helped it to mobilize anteriorly.  A 5.5 corkscrew was then placed at  the intersection of the articular surface and the greater tuberosity.  Both these suture tapes were then placed in the anterior and posterior rotator cuff flaps.   The medial suture tapes were tied.  The  corkscrew suture tapes were also tied.  These tapes were then split and placed into two Sw[place]ocks anteriorly and posteriorly to give a watertight repair.  Thorough irrigation was performed.  Deltoid split was  closed using #1 Vicryl suture followed by interrupted inverted 0 Vicryl suture, 2-0 Vicryl suture, and 3-0 Monocryl with impervious dressings and shoulder sling applied.  The patient tolerated the procedure well without immediate complications.  Luke's  assistance was required at all times for retraction, opening, closing, mobilization of tissue.  His assistance was a medical necessity.   SHW D: 03/25/2022 5:55:43 pm T: 03/25/2022 9:23:00 pm  JOB: [id number]/ [id number]

## 2022-03-27 NOTE — Patient Outreach (Signed)
  Care Coordination   03/27/2022 Name: Chelsey Jackson MRN: [id number] DOB: [birth year]   Care Coordination Outreach Attempts:  An unsuccessful telephone outreach was attempted today to offer the patient information about available care coordination services as a benefit of their health plan.   Follow Up Plan:  Additional outreach attempts will be made to offer the patient care coordination information and services.   Encounter Outcome:  No Answer   Care Coordination Interventions:  No, not indicated    Enzo Montgomery, RN,BSN,CCM [place] [facility] Telephonic Care Management Coordinator Direct Phone: [phone] Toll Free: [phone] Fax: [phone]

## 2022-03-28 ENCOUNTER — Encounter (HOSPITAL_COMMUNITY): Payer: Self-pay | Admitting: Orthopedic Surgery

## 2022-03-29 DIAGNOSIS — M75122 Complete rotator cuff tear or rupture of left shoulder, not specified as traumatic: Secondary | ICD-10-CM

## 2022-03-29 DIAGNOSIS — S43432A Superior glenoid labrum lesion of left shoulder, initial encounter: Secondary | ICD-10-CM

## 2022-03-29 DIAGNOSIS — M65912 Unspecified synovitis and tenosynovitis, left shoulder: Secondary | ICD-10-CM

## 2022-03-29 DIAGNOSIS — M65812 Other synovitis and tenosynovitis, left shoulder: Secondary | ICD-10-CM

## 2022-03-29 DIAGNOSIS — M7522 Bicipital tendinitis, left shoulder: Secondary | ICD-10-CM

## 2022-04-02 ENCOUNTER — Encounter: Payer: Self-pay | Admitting: Orthopedic Surgery

## 2022-04-02 ENCOUNTER — Ambulatory Visit (INDEPENDENT_AMBULATORY_CARE_PROVIDER_SITE_OTHER): Payer: Medicare Other | Admitting: Orthopedic Surgery

## 2022-04-02 DIAGNOSIS — M75102 Unspecified rotator cuff tear or rupture of left shoulder, not specified as traumatic: Secondary | ICD-10-CM

## 2022-04-02 MED ORDER — TRAMADOL HCL 50 MG PO TABS: 50.0000 mg | ORAL_TABLET | Freq: Every evening | ORAL | 0 refills | Status: DC | PRN

## 2022-04-04 ENCOUNTER — Encounter: Payer: Self-pay | Admitting: Physician Assistant

## 2022-04-05 ENCOUNTER — Encounter: Payer: Self-pay | Admitting: Orthopedic Surgery

## 2022-04-05 NOTE — Progress Notes (Signed)
Post-Op Visit Note   Patient: Chelsey Jackson           Date of Birth: May 26, 1950           MRN: [id number] Visit Date: 04/02/2022 PCP: Chelsey Coke, PA   Assessment & Plan:  Chief Complaint:  Chief Complaint  Patient presents with   Left Shoulder - Routine Post Op   Visit Diagnoses:  1. Tear of left supraspinatus tendon     Plan: Chelsey Jackson is a patient is now 1 week out right shoulder arthroscopy subacromial decompression and debridement mini open rotator cuff tear repair.  Hard for her to sleep because of pain.  She is on 6 machine at 62 degrees.  Takes muscle relaxer and oxycodone at night.  He has been taking Celebrex and Robaxin.  On examination her range of motion is 35/50/80.  Plan is to add tramadol to her pain regimen.  Do not take with oxycodone.  To get her set up with a range of motion brace and therapy to start and drawbridge in 2 weeks for 3 times daily for passive range of motion.  Would like for her to start in 2 weeks which would be 3 weeks postop appointment and then do that for 3 weeks until her 6-week mark where she can start doing strengthening.  Return to clinic in 3 weeks.  Follow-Up Instructions: No follow-ups on file.   Orders:  Orders Placed This Encounter  Procedures   Ambulatory referral to Physical Therapy   Meds ordered this encounter  Medications   traMADol (ULTRAM) 50 MG tablet    Sig: Take 1 tablet (50 mg total) by mouth at bedtime as needed.    Dispense:  20 tablet    Refill:  0    Imaging: No results found.  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 (SLAP) tear of left shoulder 03/29/2022   Synovitis of left shoulder 03/29/2022   Family history of heart disease 01/07/2022   Allergic rhinitis 09/12/2020   Allergic rhinitis due to animal (cat) (dog) hair and dander  09/12/2020   Mild intermittent asthma 09/12/2020   Vasomotor rhinitis 09/12/2020   Hyperlipidemia, mild 05/17/2020   Impingement syndrome of left shoulder 04/03/2020   Urge incontinence 11/05/2018   Diverticulosis of colon without hemorrhage    Primary osteoarthritis of first carpometacarpal joint of left hand 06/22/2018   Chronic pain of left thumb 06/22/2018   History of retinal tear 10/20/2016   Cough 05/02/2016   Low back pain 09/28/2015   Insomnia 08/18/2013   OSA (obstructive sleep apnea) 07/31/2013   Thyroid nodule, cold 07/15/2012   Anxiety and depression 10/05/2007   Allergic rhinitis due to pollen 10/05/2007   Asthma 10/05/2007   Past Medical History:  Diagnosis Date   AC (acromioclavicular) joint bone spurs    left shoulder with chronic pain, w/limitation ROM   Allergy    weekly allergy shots   Anemia    hx of   Anxiety    Arthritis    Asthma    Depression    Fibrocystic breast disease    GERD (gastroesophageal reflux disease)    OSA (obstructive sleep apnea) 07/31/2013   Peptic ulcer disease    TMJ locking     Family History  Problem Relation Age of Onset   Osteoporosis Mother    Fibroids  Mother    Other Mother        aortic valve replacement 1994, abdominal aortic aneurysm   Lung cancer Mother 66   Heart disease Father    Testicular cancer Father 54   Prostate cancer Father 48   Leukemia Father        CLL   Diabetes Maternal Grandmother    Heart disease Maternal Grandfather    Heart disease Paternal Grandmother    Diabetes Paternal Grandfather    Stomach cancer Paternal Grandfather    Breast cancer Paternal Aunt    Breast cancer Cousin    Colon cancer Neg Hx    Throat cancer Neg Hx    Pancreatic cancer Neg Hx     Past Surgical History:  Procedure Laterality Date   ABDOMINAL HYSTERECTOMY  2003   due to fibroids   ADENOIDECTOMY     x3   BICEPT TENODESIS Left 03/25/2022   Procedure: BICEPS TENODESIS;  Surgeon: Meredith Pel, MD;   Location: [place];  Service: Orthopedics;  Laterality: Left;   BIOPSY  06/29/2018   Procedure: BIOPSY;  Surgeon: Lavena Bullion, DO;  Location: [facility];  Service: Gastroenterology;;   BREAST CYST ASPIRATION     COLONOSCOPY  03/10/2007   results-normal   COLONOSCOPY WITH PROPOFOL N/A 06/29/2018   Procedure: COLONOSCOPY WITH PROPOFOL;  Surgeon: Lavena Bullion, DO;  Location: [facility];  Service: Gastroenterology;  Laterality: N/A;   DILATION AND CURETTAGE OF UTERUS     HAMMER TOE SURGERY     POLYPECTOMY  06/29/2018   Procedure: POLYPECTOMY;  Surgeon: Lavena Bullion, DO;  Location: [facility];  Service: Gastroenterology;;   PUBOVAGINAL SLING  '90's   for incontinence   [place] Right 2013   [place]   ROTATOR CUFF REPAIR Left 2012   [place]   SHOULDER ARTHROSCOPY WITH ROTATOR CUFF REPAIR AND SUBACROMIAL DECOMPRESSION Left 03/25/2022   Procedure: LEFT SHOULDER ARTHROSCOPY, SUBACROMIAL DECOMPRESSION, DEBRIDEMENT, MINI OPEN ROTATOR CUFF TEAR REPAIR;  Surgeon: Meredith Pel, MD;  Location: [place];  Service: Orthopedics;  Laterality: Left;   TONSILLECTOMY     x2   TUBAL LIGATION     TURBINATE REDUCTION  1997   WRIST SURGERY     Social History   Occupational History   Not on file  Tobacco Use   Smoking status: Former    Packs/day: 2.00    Years: 15.00    Total pack years: 30.00    Types: Cigarettes    Quit date: 11/19/1989    Years since quitting: 32.3   Smokeless tobacco: Never  Vaping Use   Vaping Use: Never used  Substance and Sexual Activity   Alcohol use: Yes    Comment: maybe a glass of wine on the weekend   Drug use: No   Sexual activity: Not on file

## 2022-04-07 ENCOUNTER — Ambulatory Visit (HOSPITAL_BASED_OUTPATIENT_CLINIC_OR_DEPARTMENT_OTHER): Payer: Medicare Other | Admitting: Physical Therapy

## 2022-04-07 ENCOUNTER — Encounter (HOSPITAL_BASED_OUTPATIENT_CLINIC_OR_DEPARTMENT_OTHER): Payer: Self-pay

## 2022-04-10 ENCOUNTER — Other Ambulatory Visit: Payer: Self-pay | Admitting: Physician Assistant

## 2022-04-12 ENCOUNTER — Encounter: Payer: Self-pay | Admitting: Physician Assistant

## 2022-04-13 ENCOUNTER — Other Ambulatory Visit (HOSPITAL_BASED_OUTPATIENT_CLINIC_OR_DEPARTMENT_OTHER): Payer: Self-pay | Admitting: Cardiology

## 2022-04-15 NOTE — Telephone Encounter (Signed)
Rx(s) sent to pharmacy electronically.  

## 2022-04-16 ENCOUNTER — Ambulatory Visit (HOSPITAL_BASED_OUTPATIENT_CLINIC_OR_DEPARTMENT_OTHER): Payer: Medicare Other | Attending: Orthopedic Surgery | Admitting: Physical Therapy

## 2022-04-16 ENCOUNTER — Other Ambulatory Visit: Payer: Self-pay

## 2022-04-16 DIAGNOSIS — M25612 Stiffness of left shoulder, not elsewhere classified: Secondary | ICD-10-CM | POA: Diagnosis present

## 2022-04-16 DIAGNOSIS — M75102 Unspecified rotator cuff tear or rupture of left shoulder, not specified as traumatic: Secondary | ICD-10-CM | POA: Insufficient documentation

## 2022-04-16 DIAGNOSIS — M6281 Muscle weakness (generalized): Secondary | ICD-10-CM | POA: Diagnosis present

## 2022-04-16 DIAGNOSIS — M25512 Pain in left shoulder: Secondary | ICD-10-CM | POA: Insufficient documentation

## 2022-04-16 NOTE — Therapy (Signed)
OUTPATIENT PHYSICAL THERAPY SHOULDER EVALUATION   Patient Name: Chelsey Jackson MRN: [id number] DOB:[birth year], 71 y.o., female Today's Date: 04/17/2022  END OF SESSION:  PT End of Session - 04/16/22 1249     Visit Number 1    Number of Visits 28    Date for PT Re-Evaluation 07/10/22    Authorization Type MCR A and B    PT Start Time 1151    PT Stop Time 1240    PT Time Calculation (min) 49 min    Activity Tolerance Patient tolerated treatment well    Behavior During Therapy WFL for tasks assessed/performed             Past Medical History:  Diagnosis Date   AC (acromioclavicular) joint bone spurs    left shoulder with chronic pain, w/limitation ROM   Allergy    weekly allergy shots   Anemia    hx of   Anxiety    Arthritis    Asthma    Depression    Fibrocystic breast disease    GERD (gastroesophageal reflux disease)    OSA (obstructive sleep apnea) 07/31/2013   Peptic ulcer disease    TMJ locking    Past Surgical History:  Procedure Laterality Date   ABDOMINAL HYSTERECTOMY  2003   due to fibroids   ADENOIDECTOMY     x3   BICEPT TENODESIS Left 03/25/2022   Procedure: BICEPS TENODESIS;  Surgeon: Meredith Pel, MD;  Location: [place];  Service: Orthopedics;  Laterality: Left;   BIOPSY  06/29/2018   Procedure: BIOPSY;  Surgeon: Lavena Bullion, DO;  Location: [facility];  Service: Gastroenterology;;   BREAST CYST ASPIRATION     COLONOSCOPY  03/10/2007   results-normal   COLONOSCOPY WITH PROPOFOL N/A 06/29/2018   Procedure: COLONOSCOPY WITH PROPOFOL;  Surgeon: Lavena Bullion, DO;  Location[facility];  Service: Gastroenterology;  Laterality: N/A;   DILATION AND CURETTAGE OF UTERUS     HAMMER TOE SURGERY     POLYPECTOMY  06/29/2018   Procedure: POLYPECTOMY;  Surgeon: Lavena Bullion, DO;  Location: [facility];  Service: Gastroenterology;;   PUBOVAGINAL SLING  '90's   for incontinence   [place] Right  2013   [place]   ROTATOR CUFF REPAIR Left 2012   [place]   SHOULDER ARTHROSCOPY WITH ROTATOR CUFF REPAIR AND SUBACROMIAL DECOMPRESSION Left 03/25/2022   Procedure: LEFT SHOULDER ARTHROSCOPY, SUBACROMIAL DECOMPRESSION, DEBRIDEMENT, MINI OPEN ROTATOR CUFF TEAR REPAIR;  Surgeon: Meredith Pel, MD;  Location: [place];  Service: Orthopedics;  Laterality: Left;   TONSILLECTOMY     x2   TUBAL LIGATION     [place]   WRIST SURGERY     Patient Active Problem List   Diagnosis Date Noted   Complete tear of left rotator cuff 03/29/2022   Biceps tendinitis on left 03/29/2022   Degenerative superior labral 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 (SLAP) tear of left shoulder 03/29/2022   Synovitis of left shoulder 03/29/2022   Family history of heart disease 01/07/2022   Allergic rhinitis 09/12/2020   Allergic rhinitis due to animal (cat) (dog) hair and dander 09/12/2020   Mild intermittent asthma 09/12/2020   Vasomotor rhinitis 09/12/2020   Hyperlipidemia, mild 05/17/2020   Impingement syndrome of left shoulder 04/03/2020   Urge incontinence 11/05/2018   Diverticulosis of colon without hemorrhage    Primary osteoarthritis of first carpometacarpal joint of left hand 06/22/2018   Chronic pain of left thumb 06/22/2018  History of retinal tear 10/20/2016   Cough 05/02/2016   Low back pain 09/28/2015   Insomnia 08/18/2013   OSA (obstructive sleep apnea) 07/31/2013   Thyroid nodule, cold 07/15/2012   Anxiety and depression 10/05/2007   Allergic rhinitis due to pollen 10/05/2007   Asthma 10/05/2007    PCP: [name], PA   REFERRING PROVIDER: Meredith Pel, MD  REFERRING DIAG: [phone] (ICD-10-CM) - Tear of left supraspinatus tendon  THERAPY DIAG:  Left shoulder pain, unspecified chronicity  Stiffness of left shoulder, not elsewhere classified  Muscle weakness (generalized)  Rationale for Evaluation and Treatment: Rehabilitation  ONSET DATE: DOS  03/25/2022  SUBJECTIVE:                                                                                                                                                                                      SUBJECTIVE STATEMENT: Pt had shoulder pain for 3-4 years with no specific MOI.  Pt received injections which helped initially but not lately.  Pt had shoulder pain when pulling on rail to come out of the pool approx 4 months.  Pt underwent Left shoulder arthroscopy with superior labral debridement, biceps tenodesis and mini open rotator cuff tear repair on 03/25/22.  Pt saw MD on 04/02/22 and note indicated pt to start PT in 2 weeks for PROM which would be 3 weeks postop and then do that for 3 weeks until her 6-week mark where she can start doing strengthening.  Pt to get set up with a range of motion brace.  PT order indicated PT to start in 2 weeks 3x/wk PROM x 3 weeks.   Pt is limited with all of her self care activities, ADLs, and IADLs.  Pt is limited per protocol and unable to perform any reaching and overhead activities.  Pt unable to perform any quilting and water aerobics classes.  Pt uses a ROM machine for shoulder which she has reached 72 deg of abd.    PERTINENT HISTORY: L shoulder mini open RCR, biceps tenodesis, and labral debridement on 03/25/22.  PROM x 3 weeks Hx of bilat shoulder arthroscopies Depression and Anxiety which is controlled  PAIN:  Pt denies pain currently and reports having discomfort.  Pt states she has a constant discomfort and the medication hasn't helped.   PRECAUTIONS: Other: surgical protocol; PROM currently  WEIGHT BEARING RESTRICTIONS: Yes L UE  FALLS:  Has patient fallen in last 6 months? No    OCCUPATION: Pt is retired  PLOF: Independent.  Pt was able to perform her ADLs and IADLs independently.  Pt performs water aerobics 3x/wk.  Pt enjoys quilting and embroidering.    PATIENT  GOALS: to fasten bra in the back.  To return to water aerobics and be  able to quilt.  Return to her normal activities including driving.   NEXT MD VISIT:   OBJECTIVE:   DIAGNOSTIC FINDINGS:  Pt is post op.  PATIENT SURVEYS:  FOTO 16 with a goal of 54 at visit 23  COGNITION: Overall cognitive status: Within functional limits for tasks assessed      OBSERVATION: Pt not in sling.  PT educated pt that she needs to wear her sling and educated pt on the importance and rationale of wearing the sling.  Pt states the sling is flimsy.  PT instructed pt to call MD about her sling and to request a new sling if it's not comfortable.  Pt agreed to wear the sling when she returned home.   Pt is R hand dominant.  UPPER EXTREMITY ROM:    ROM Right eval Left eval  Shoulder flexion AROM: 156 PROM: 96  Shoulder scaption AROM: 155   Shoulder abduction AROM: 121   Shoulder adduction    Shoulder internal rotation    Shoulder external rotation  PROM: 24  Elbow flexion    Elbow extension    Wrist flexion    Wrist extension    Wrist ulnar deviation    Wrist radial deviation    Wrist pronation    Wrist supination    (Blank rows = not tested)  UPPER EXTREMITY MMT:  Strength not tested due to healing constraints/surgical protocol   PALPATION:  TTP:  anterior and lateral L shoulder   TODAY'S TREATMENT:                                                                                                                                           Pt performed pendulums 2x10 cw and ccw, wrist flexion and extension AROM with arm supported 2x10, towel gripping 2x10 reps with arm supported.  Pt received a HEP handout and was educated in correct form and appropriate frequency.   See below for pt education.     PATIENT EDUCATION: Education details: PT educated pt that she needs to wear her sling and educated pt on the importance and rationale of wearing the sling.  Pt states the sling is flimsy.  PT instructed pt to call MD about her sling and to request a new sling if  it's not comfortable.  Objective findings, protocol limitations and restrictions, MD order, relevant anatomy, dx, HEP, and POC.  Person educated: Patient Education method: Explanation, Demonstration, Tactile cues, Verbal cues, and Handouts Education comprehension: verbalized understanding, returned demonstration, verbal cues required, tactile cues required, and needs further education  HOME EXERCISE PROGRAM: Access Code: [email or web address] URL: [email or web address] Date: 04/16/2022 Prepared by: Ronny Flurry  Exercises - Circular Shoulder Pendulum with Table Support  - 2-3 x daily - 7 x weekly - 2-3 sets - 10 reps - Wrist AROM  Flexion Extension  - 3 x daily - 7 x weekly - 2-3 sets - 10 reps - Seated Gripping Towel  - 2-3 x daily - 7 x weekly - 2 sets - 10 reps  ASSESSMENT:  CLINICAL IMPRESSION: Patient is a 71 y.o. female 3 weeks and 1 day s/p L shoulder RCR, biceps tenodesis, and labral debridement presenting to the clinic with expected post op findings of L shoulder pain, limited ROM in L shoulder, and muscle weakness in L UE.  Pt presented to Rx without her sling PT instructed her in wearing her sling.  Pt agreed.  Pt is limited with all of her self care activities, ADLs, and IADLs.  Pt is limited per protocol and unable to perform any reaching and overhead activities.  Pt enjoys quilting and water aerobics and is unable to perform either of these currently.  Pt should benefit from skilled PT services per protocol to address impairments and improve overall function.  OBJECTIVE IMPAIRMENTS: decreased activity tolerance, decreased endurance, decreased ROM, decreased strength, hypomobility, impaired flexibility, impaired UE functional use, and pain.   ACTIVITY LIMITATIONS: carrying, lifting, bathing, dressing, reach over head, and hygiene/grooming  PARTICIPATION LIMITATIONS: meal prep, cleaning, laundry, driving, shopping, and community activity  PERSONAL FACTORS: 1 comorbidity: R  shoulder arthroscopy  are also affecting patient's functional outcome.   REHAB POTENTIAL: Good  CLINICAL DECISION MAKING: Stable/uncomplicated  EVALUATION COMPLEXITY: Low   GOALS:  SHORT TERM GOALS: Target date: 05/14/2022   Pt will be independent and compliant with HEP for improved pain, ROM, and function.  Baseline: Goal status: INITIAL Target date:  05/21/2022  2.  Pt will demo improved L shoulder PROM to at least 140 deg in flexion and 50 deg in ER for improved shoulder mobility and stiffness.  Baseline:  Goal status: INITIAL Target date:  05/07/2022  3.  Pt will tolerate AAROM as allowed by MD without adverse effects for improved shoulder mobility.  Baseline:  Goal status: INITIAL Target date:  05/14/2022  4.  Pt will wean out of sling without adverse effects.  Baseline:  Goal status: INITIAL Target date:  05/14/2022  5.  Pt will demo L shoulder AAROM in supine to be at least 120 deg in flexion and 50 deg in ER for improved shoulder mobility and stiffness.  Baseline:  Goal status: INITIAL Target date:  05/21/2022  6.  Pt will be able to actively elevate L shoulder at least 90 deg without significant shoulder hike.  Baseline:  Goal status: INITIAL Target date:  06/11/2022  7.  Pt will report she is able to perform her self care activities with no > than minimal difficulty.  Baseline:  Goal status: INITIAL Target date:  06/18/2022    LONG TERM GOALS: Target date: 08/06/2022   Pt will demo L shoulder AROM to be [facility] t/o for performance of ADLs and IADLs.   Baseline:  Goal status: INITIAL  2.  Pt will be able to perform her ADLs and IADLs without significant difficulty and pain.  Baseline:  Goal status: INITIAL  3.  Pt will be able to perform her normal reaching and overhead activities without significant pain and limitation.  Baseline:  Goal status: INITIAL  4.  Pt will be able to perform quilting without adverse effects.  Baseline:  Goal status:  INITIAL  5.  Pt will demo improved L shoulder strength to at least 4+/5 MMT t/o for functional carrying/lifting and performance of household chores.  Baseline:  Goal status: INITIAL  PLAN:  PT FREQUENCY:  2x/wk x 12 weeks and 1x/wk x 4 weeks  PT DURATION: other: 12-16 weeks  PLANNED INTERVENTIONS: Therapeutic exercises, Therapeutic activity, Neuromuscular re-education, Patient/Family education, Self Care, Joint mobilization, Aquatic Therapy, Dry Needling, Electrical stimulation, Spinal mobilization, Cryotherapy, Moist heat, scar mobilization, Taping, Ultrasound, Manual therapy, and Re-evaluation  PLAN FOR NEXT SESSION: Cont per RCR protocol with precautions to biceps tenodesis.  PROM x 3 weeks per MD order.     Selinda Michaels III PT, DPT 04/17/22 5:00 PM

## 2022-04-17 ENCOUNTER — Encounter (HOSPITAL_BASED_OUTPATIENT_CLINIC_OR_DEPARTMENT_OTHER): Payer: Self-pay | Admitting: Physical Therapy

## 2022-04-22 ENCOUNTER — Ambulatory Visit: Payer: Medicare Other

## 2022-04-22 ENCOUNTER — Encounter (HOSPITAL_BASED_OUTPATIENT_CLINIC_OR_DEPARTMENT_OTHER): Payer: Self-pay | Admitting: Physical Therapy

## 2022-04-22 ENCOUNTER — Telehealth (HOSPITAL_BASED_OUTPATIENT_CLINIC_OR_DEPARTMENT_OTHER): Payer: Self-pay | Admitting: Physical Therapy

## 2022-04-22 ENCOUNTER — Ambulatory Visit (HOSPITAL_BASED_OUTPATIENT_CLINIC_OR_DEPARTMENT_OTHER): Payer: Medicare Other | Attending: Orthopedic Surgery | Admitting: Physical Therapy

## 2022-04-22 DIAGNOSIS — M25612 Stiffness of left shoulder, not elsewhere classified: Secondary | ICD-10-CM

## 2022-04-22 DIAGNOSIS — M6281 Muscle weakness (generalized): Secondary | ICD-10-CM | POA: Insufficient documentation

## 2022-04-22 DIAGNOSIS — M25512 Pain in left shoulder: Secondary | ICD-10-CM

## 2022-04-22 NOTE — Therapy (Signed)
OUTPATIENT PHYSICAL THERAPY SHOULDER TREATMENT    Patient Name: Chelsey Jackson MRN: [id number] DOB:03-22-1951, 72 y.o., female Today's Date: 04/22/2022  END OF SESSION:  PT End of Session - 04/22/22 1058     Visit Number 2    Number of Visits 28    Date for PT Re-Evaluation 07/10/22    Authorization Type MCR A and B    PT Start Time 1040   pt extremely late still wanted to be seen   PT Stop Time 1057    PT Time Calculation (min) 17 min    Activity Tolerance Patient tolerated treatment well    Behavior During Therapy WFL for tasks assessed/performed              Past Medical History:  Diagnosis Date   AC (acromioclavicular) joint bone spurs    left shoulder with chronic pain, w/limitation ROM   Allergy    weekly allergy shots   Anemia    hx of   Anxiety    Arthritis    Asthma    Depression    Fibrocystic breast disease    GERD (gastroesophageal reflux disease)    OSA (obstructive sleep apnea) 07/31/2013   Peptic ulcer disease    TMJ locking    Past Surgical History:  Procedure Laterality Date   ABDOMINAL HYSTERECTOMY  2003   due to fibroids   ADENOIDECTOMY     x3   BICEPT TENODESIS Left 03/25/2022   Procedure: BICEPS TENODESIS;  Surgeon: Meredith Pel, MD;  Location: [place];  Service: Orthopedics;  Laterality: Left;   BIOPSY  06/29/2018   Procedure: BIOPSY;  Surgeon: Lavena Bullion, DO;  Location: [facility];  Service: Gastroenterology;;   BREAST CYST ASPIRATION     COLONOSCOPY  03/10/2007   results-normal   COLONOSCOPY WITH PROPOFOL N/A 06/29/2018   Procedure: COLONOSCOPY WITH PROPOFOL;  Surgeon: Lavena Bullion, DO;  Location: [facility];  Service: Gastroenterology;  Laterality: N/A;   DILATION AND CURETTAGE OF UTERUS     HAMMER TOE SURGERY     POLYPECTOMY  06/29/2018   Procedure: POLYPECTOMY;  Surgeon: Lavena Bullion, DO;  Location: [facility];  Service: Gastroenterology;;   PUBOVAGINAL SLING  '90's   for incontinence    [place] Right 2013   [place]   ROTATOR CUFF REPAIR Left 2012   [place]   SHOULDER ARTHROSCOPY WITH ROTATOR CUFF REPAIR AND SUBACROMIAL DECOMPRESSION Left 03/25/2022   Procedure: LEFT SHOULDER ARTHROSCOPY, SUBACROMIAL DECOMPRESSION, DEBRIDEMENT, MINI OPEN ROTATOR CUFF TEAR REPAIR;  Surgeon: Meredith Pel, MD;  Location: [place];  Service: Orthopedics;  Laterality: Left;   TONSILLECTOMY     x2   TUBAL LIGATION     [place]   WRIST SURGERY     Patient Active Problem List   Diagnosis Date Noted   Complete tear of left rotator cuff 03/29/2022   Biceps tendinitis on left 03/29/2022   Degenerative superior labral 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 (SLAP) tear of left shoulder 03/29/2022   Synovitis of left shoulder 03/29/2022   Family history of heart disease 01/07/2022   Allergic rhinitis 09/12/2020   Allergic rhinitis due to animal (cat) (dog) hair and dander 09/12/2020   Mild intermittent asthma 09/12/2020   Vasomotor rhinitis 09/12/2020   Hyperlipidemia, mild 05/17/2020   Impingement syndrome of left shoulder 04/03/2020   Urge incontinence 11/05/2018   Diverticulosis of colon without hemorrhage    Primary osteoarthritis of first carpometacarpal joint of left  hand 06/22/2018   Chronic pain of left thumb 06/22/2018   History of retinal tear 10/20/2016   Cough 05/02/2016   Low back pain 09/28/2015   Insomnia 08/18/2013   OSA (obstructive sleep apnea) 07/31/2013   Thyroid nodule, cold 07/15/2012   Anxiety and depression 10/05/2007   Allergic rhinitis due to pollen 10/05/2007   Asthma 10/05/2007    PCP: [name], PA   REFERRING PROVIDER: Meredith Pel, MD  REFERRING DIAG: [phone] (ICD-10-CM) - Tear of left supraspinatus tendon  THERAPY DIAG:  Left shoulder pain, unspecified chronicity  Stiffness of left shoulder, not elsewhere classified  Muscle weakness (generalized)  Rationale for Evaluation and Treatment:  Rehabilitation  ONSET DATE: DOS 03/25/2022  SUBJECTIVE:                                                                                                                                                                                      SUBJECTIVE STATEMENT:  I thought the time was 1045 not 1015 and I thought I had Trey not you. My schedule got changed somehow. I can't find a comfortable place for myself and I can't sleep. He told me I need to be in the sling 3 times so now I'm wearing a shower sling.   Eval: Pt had shoulder pain for 3-4 years with no specific MOI.  Pt received injections which helped initially but not lately.  Pt had shoulder pain when pulling on rail to come out of the pool approx 4 months.  Pt underwent Left shoulder arthroscopy with superior labral debridement, biceps tenodesis and mini open rotator cuff tear repair on 03/25/22.  Pt saw MD on 04/02/22 and note indicated pt to start PT in 2 weeks for PROM which would be 3 weeks postop and then do that for 3 weeks until her 6-week mark where she can start doing strengthening.  Pt to get set up with a range of motion brace.  PT order indicated PT to start in 2 weeks 3x/wk PROM x 3 weeks.   Pt is limited with all of her self care activities, ADLs, and IADLs.  Pt is limited per protocol and unable to perform any reaching and overhead activities.  Pt unable to perform any quilting and water aerobics classes.  Pt uses a ROM machine for shoulder which she has reached 72 deg of abd.    PERTINENT HISTORY: L shoulder mini open RCR, biceps tenodesis, and labral debridement on 03/25/22.  PROM x 3 weeks Hx of bilat shoulder arthroscopies Depression and Anxiety which is controlled  PAIN:  Pt denies pain currently and reports having discomfort.  Pt states she has a constant discomfort  and the medication hasn't helped.   PRECAUTIONS: Other: surgical protocol; PROM currently  WEIGHT BEARING RESTRICTIONS: Yes L UE  FALLS:  Has patient  fallen in last 6 months? No    OCCUPATION: Pt is retired  PLOF: Independent.  Pt was able to perform her ADLs and IADLs independently.  Pt performs water aerobics 3x/wk.  Pt enjoys quilting and embroidering.    PATIENT GOALS: to fasten bra in the back.  To return to water aerobics and be able to quilt.  Return to her normal activities including driving.   NEXT MD VISIT:   OBJECTIVE:   DIAGNOSTIC FINDINGS:  Pt is post op.  PATIENT SURVEYS:  FOTO 16 with a goal of 54 at visit 23  COGNITION: Overall cognitive status: Within functional limits for tasks assessed      OBSERVATION: Pt not in sling.  PT educated pt that she needs to wear her sling and educated pt on the importance and rationale of wearing the sling.  Pt states the sling is flimsy.  PT instructed pt to call MD about her sling and to request a new sling if it's not comfortable.  Pt agreed to wear the sling when she returned home.   Pt is R hand dominant.  UPPER EXTREMITY ROM:    ROM Right eval Left eval  Shoulder flexion AROM: 156 PROM: 96  Shoulder scaption AROM: 155   Shoulder abduction AROM: 121   Shoulder adduction    Shoulder internal rotation    Shoulder external rotation  PROM: 24  Elbow flexion    Elbow extension    Wrist flexion    Wrist extension    Wrist ulnar deviation    Wrist radial deviation    Wrist pronation    Wrist supination    (Blank rows = not tested)  UPPER EXTREMITY MMT:  Strength not tested due to healing constraints/surgical protocol   PALPATION:  TTP:  anterior and lateral L shoulder   TODAY'S TREATMENT:                                                                                                                                           04/22/22    PROM as appropriate/per protocol supine    PATIENT EDUCATION: Education details: importance of getting to PT on time for full benefit of session, discuss meds with MD as she is not able to sleep due to pain,  recommended sleeping in sling in armchair for improved comfort  Person educated: Patient Education method: Explanation, Demonstration, Tactile cues, Verbal cues, and Handouts Education comprehension: verbalized understanding, returned demonstration, verbal cues required, tactile cues required, and needs further education  HOME EXERCISE PROGRAM: Access Code: [email or web address] URL: [email or web address] Date: 04/16/2022 Prepared by: Ronny Flurry  Exercises - Circular Shoulder Pendulum with Table Support  - 2-3 x daily - 7 x weekly - 2-3 sets - 10 reps -  Wrist AROM Flexion Extension  - 3 x daily - 7 x weekly - 2-3 sets - 10 reps - Seated Gripping Towel  - 2-3 x daily - 7 x weekly - 2 sets - 10 reps  ASSESSMENT:  CLINICAL IMPRESSION:  Precious Bard arrived extremely late today still requested to be seen for limited session. Educated that we would have time for a small amount of PROM only. Reminded her to double check time/date of next appointments to make sure she is getting full benefit in the future.   OBJECTIVE IMPAIRMENTS: decreased activity tolerance, decreased endurance, decreased ROM, decreased strength, hypomobility, impaired flexibility, impaired UE functional use, and pain.   ACTIVITY LIMITATIONS: carrying, lifting, bathing, dressing, reach over head, and hygiene/grooming  PARTICIPATION LIMITATIONS: meal prep, cleaning, laundry, driving, shopping, and community activity  PERSONAL FACTORS: 1 comorbidity: R shoulder arthroscopy  are also affecting patient's functional outcome.   REHAB POTENTIAL: Good  CLINICAL DECISION MAKING: Stable/uncomplicated  EVALUATION COMPLEXITY: Low   GOALS:  SHORT TERM GOALS: Target date: 05/14/2022   Pt will be independent and compliant with HEP for improved pain, ROM, and function.  Baseline: Goal status: INITIAL Target date:  05/21/2022  2.  Pt will demo improved L shoulder PROM to at least 140 deg in flexion and 50 deg in ER for improved  shoulder mobility and stiffness.  Baseline:  Goal status: INITIAL Target date:  05/07/2022  3.  Pt will tolerate AAROM as allowed by MD without adverse effects for improved shoulder mobility.  Baseline:  Goal status: INITIAL Target date:  05/14/2022  4.  Pt will wean out of sling without adverse effects.  Baseline:  Goal status: INITIAL Target date:  05/14/2022  5.  Pt will demo L shoulder AAROM in supine to be at least 120 deg in flexion and 50 deg in ER for improved shoulder mobility and stiffness.  Baseline:  Goal status: INITIAL Target date:  05/21/2022  6.  Pt will be able to actively elevate L shoulder at least 90 deg without significant shoulder hike.  Baseline:  Goal status: INITIAL Target date:  06/11/2022  7.  Pt will report she is able to perform her self care activities with no > than minimal difficulty.  Baseline:  Goal status: INITIAL Target date:  06/18/2022    LONG TERM GOALS: Target date: 08/06/2022   Pt will demo L shoulder AROM to be [facility] t/o for performance of ADLs and IADLs.   Baseline:  Goal status: INITIAL  2.  Pt will be able to perform her ADLs and IADLs without significant difficulty and pain.  Baseline:  Goal status: INITIAL  3.  Pt will be able to perform her normal reaching and overhead activities without significant pain and limitation.  Baseline:  Goal status: INITIAL  4.  Pt will be able to perform quilting without adverse effects.  Baseline:  Goal status: INITIAL  5.  Pt will demo improved L shoulder strength to at least 4+/5 MMT t/o for functional carrying/lifting and performance of household chores.  Baseline:  Goal status: INITIAL    PLAN:  PT FREQUENCY:  2x/wk x 12 weeks and 1x/wk x 4 weeks  PT DURATION: other: 12-16 weeks  PLANNED INTERVENTIONS: Therapeutic exercises, Therapeutic activity, Neuromuscular re-education, Patient/Family education, Self Care, Joint mobilization, Aquatic Therapy, Dry Needling, Electrical  stimulation, Spinal mobilization, Cryotherapy, Moist heat, scar mobilization, Taping, Ultrasound, Manual therapy, and Re-evaluation  PLAN FOR NEXT SESSION: Cont per RCR protocol with precautions to biceps tenodesis.  PROM x 3  weeks per MD order.    Ann Lions PT DPT PN2

## 2022-04-22 NOTE — Telephone Encounter (Signed)
No-show for today's PT appt. Called and left voicemail about missed visit and time/date of next scheduled session.  Ann Lions PT DPT PN2

## 2022-04-23 ENCOUNTER — Ambulatory Visit (INDEPENDENT_AMBULATORY_CARE_PROVIDER_SITE_OTHER): Payer: Medicare Other

## 2022-04-23 ENCOUNTER — Ambulatory Visit (INDEPENDENT_AMBULATORY_CARE_PROVIDER_SITE_OTHER): Payer: Medicare Other | Admitting: Surgical

## 2022-04-23 DIAGNOSIS — M25512 Pain in left shoulder: Secondary | ICD-10-CM

## 2022-04-23 DIAGNOSIS — G8929 Other chronic pain: Secondary | ICD-10-CM

## 2022-04-23 DIAGNOSIS — M75102 Unspecified rotator cuff tear or rupture of left shoulder, not specified as traumatic: Secondary | ICD-10-CM

## 2022-04-23 MED ORDER — HYDROMORPHONE HCL 2 MG PO TABS: 1.0000 mg | ORAL_TABLET | Freq: Four times a day (QID) | ORAL | 0 refills | Status: DC | PRN

## 2022-04-24 NOTE — Therapy (Signed)
OUTPATIENT PHYSICAL THERAPY SHOULDER TREATMENT    Patient Name: Chelsey Jackson MRN: [id number] DOB:12-Aug-1950, 72 y.o., female Today's Date: 04/25/2022  END OF SESSION:  PT End of Session - 04/25/22 1103     Visit Number 3    Number of Visits 28    Date for PT Re-Evaluation 07/10/22    Authorization Type MCR A and B    PT Start Time 1020    PT Stop Time 1054    PT Time Calculation (min) 34 min    Activity Tolerance Patient tolerated treatment well    Behavior During Therapy WFL for tasks assessed/performed               Past Medical History:  Diagnosis Date   AC (acromioclavicular) joint bone spurs    left shoulder with chronic pain, w/limitation ROM   Allergy    weekly allergy shots   Anemia    hx of   Anxiety    Arthritis    Asthma    Depression    Fibrocystic breast disease    GERD (gastroesophageal reflux disease)    OSA (obstructive sleep apnea) 07/31/2013   Peptic ulcer disease    TMJ locking    Past Surgical History:  Procedure Laterality Date   ABDOMINAL HYSTERECTOMY  2003   due to fibroids   ADENOIDECTOMY     x3   BICEPT TENODESIS Left 03/25/2022   Procedure: BICEPS TENODESIS;  Surgeon: Meredith Pel, MD;  Location: [place];  Service: Orthopedics;  Laterality: Left;   BIOPSY  06/29/2018   Procedure: BIOPSY;  Surgeon: Lavena Bullion, DO;  Location: [facility];  Service: Gastroenterology;;   BREAST CYST ASPIRATION     COLONOSCOPY  03/10/2007   results-normal   COLONOSCOPY WITH PROPOFOL N/A 06/29/2018   Procedure: COLONOSCOPY WITH PROPOFOL;  Surgeon: Lavena Bullion, DO;  Location: [facility];  Service: Gastroenterology;  Laterality: N/A;   DILATION AND CURETTAGE OF UTERUS     HAMMER TOE SURGERY     POLYPECTOMY  06/29/2018   Procedure: POLYPECTOMY;  Surgeon: Lavena Bullion, DO;  Location: [facility];  Service: Gastroenterology;;   PUBOVAGINAL SLING  '90's   for incontinence   [place] Right  2013   [place]   ROTATOR CUFF REPAIR Left 2012   [place]   SHOULDER ARTHROSCOPY WITH ROTATOR CUFF REPAIR AND SUBACROMIAL DECOMPRESSION Left 03/25/2022   Procedure: LEFT SHOULDER ARTHROSCOPY, SUBACROMIAL DECOMPRESSION, DEBRIDEMENT, MINI OPEN ROTATOR CUFF TEAR REPAIR;  Surgeon: Meredith Pel, MD;  Location: [place];  Service: Orthopedics;  Laterality: Left;   TONSILLECTOMY     x2   TUBAL LIGATION     [place]   WRIST SURGERY     Patient Active Problem List   Diagnosis Date Noted   Complete tear of left rotator cuff 03/29/2022   Biceps tendinitis on left 03/29/2022   Degenerative superior labral 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 (SLAP) tear of left shoulder 03/29/2022   Synovitis of left shoulder 03/29/2022   Family history of heart disease 01/07/2022   Allergic rhinitis 09/12/2020   Allergic rhinitis due to animal (cat) (dog) hair and dander 09/12/2020   Mild intermittent asthma 09/12/2020   Vasomotor rhinitis 09/12/2020   Hyperlipidemia, mild 05/17/2020   Impingement syndrome of left shoulder 04/03/2020   Urge incontinence 11/05/2018   Diverticulosis of colon without hemorrhage    Primary osteoarthritis of first carpometacarpal joint of left hand 06/22/2018   Chronic pain of left  thumb 06/22/2018   History of retinal tear 10/20/2016   Cough 05/02/2016   Low back pain 09/28/2015   Insomnia 08/18/2013   OSA (obstructive sleep apnea) 07/31/2013   Thyroid nodule, cold 07/15/2012   Anxiety and depression 10/05/2007   Allergic rhinitis due to pollen 10/05/2007   Asthma 10/05/2007    PCP: [name], PA   REFERRING PROVIDER: Meredith Pel, MD  REFERRING DIAG: [phone] (ICD-10-CM) - Tear of left supraspinatus tendon  THERAPY DIAG:  Left shoulder pain, unspecified chronicity  Stiffness of left shoulder, not elsewhere classified  Muscle weakness (generalized)  Rationale for Evaluation and Treatment: Rehabilitation  ONSET DATE: DOS  03/25/2022  SUBJECTIVE:                                                                                                                                                                                      SUBJECTIVE STATEMENT: Pt is 4 weeks and 2 days s/p L shoulder RCR, biceps tenodesis, and labral debridement.  Pt states she continues to have an ache in her L shoulder.  She continues to have much difficulty sleeping due to L shoulder pain.  Pt saw PA on Wednesday and pt informed him about her night pain.  He ordered a new medication to help her night time pain.  She took it last night and states it didn't help.  Pt reports she hasn't slept more than 3 hours at a time in over a month.  Pt states PA informed her she didn't need to wear the sling in the house, but to wear it out of the house.  Pt states PA informed her PROM x 2 more weeks and then more active after that.  Pt states she had increased pain after PA visit.     Pt reports compliance with HEP.  She is still using the ROM chair, but they are coming to pick it up soon.  Pt is limited with all of her self care activities, ADLs, and IADLs.  Pt is limited per protocol and unable to perform any reaching and overhead activities.  Pt unable to perform any quilting and water aerobics classes.    PERTINENT HISTORY: L shoulder mini open RCR, biceps tenodesis, and labral debridement on 03/25/22.  PROM x 3 weeks Hx of bilat shoulder arthroscopies Depression and Anxiety which is controlled  PAIN:  2/10 L shoulder pain  PRECAUTIONS: Other: surgical protocol; PROM currently  WEIGHT BEARING RESTRICTIONS: Yes L UE  FALLS:  Has patient fallen in last 6 months? No   OCCUPATION: Pt is retired  PLOF: Independent.  Pt was able to perform her ADLs and IADLs independently.  Pt performs  water aerobics 3x/wk.  Pt enjoys quilting and embroidering.    PATIENT GOALS: to fasten bra in the back.  To return to water aerobics and be able to quilt.  Return to  her normal activities including driving.     OBJECTIVE:   DIAGNOSTIC FINDINGS:  Pt is post op.      TODAY'S TREATMENT:           OBSERVATION: Pt wearing sling.    Pt is R hand dominant.  UPPER EXTREMITY ROM:    ROM Right eval Left eval Left 1/5  Shoulder flexion AROM: 156 PROM: 96 PROM:  128  Shoulder scaption AROM: 155    Shoulder abduction AROM: 121    Shoulder adduction     Shoulder internal rotation     Shoulder external rotation  PROM: 24 PROM: 37  Elbow flexion     Elbow extension     Wrist flexion     Wrist extension     Wrist ulnar deviation     Wrist radial deviation     Wrist pronation     Wrist supination     (Blank rows = not tested)                                                                                                                             Reviewed response to prior Rx, HEP compliance, and pain level. Pt performed: Wrist flexion AROM 2x10 reps with arm supported Wrist extension AROM 2x10 reps with arm supported Towel gripping with arm supported 2x10 reps Pendulums 2x10 cw and ccw  Pt received L shoulder flexion, scaption, and ER PROM per pt and tissue tolerance w/n protocol ranges    PATIENT EDUCATION: Education details:  HEP, POC, objective findings and progress.  PT instructed pt in icing.   Person educated: Patient Education method: Explanation, Demonstration, Tactile cues, Verbal cues Education comprehension: verbalized understanding, returned demonstration, verbal cues required, tactile cues required, and needs further education  HOME EXERCISE PROGRAM: Access Code: [email or web address] URL: [email or web address] Date: 04/16/2022 Prepared by: Ronny Flurry  Exercises - Circular Shoulder Pendulum with Table Support  - 2-3 x daily - 7 x weekly - 2-3 sets - 10 reps - Wrist AROM Flexion Extension  - 3 x daily - 7 x weekly - 2-3 sets - 10 reps - Seated Gripping Towel  - 2-3 x daily - 7 x weekly - 2 sets - 10  reps  ASSESSMENT:  CLINICAL IMPRESSION:  PT reviewed HEP and pt performed HEP well.  Pt received PROM per protocol ranges today w/n pt tolerance.  She required some cuing to relax L UE and decrease guarding with PROM.  Pt has tightness in L shoulder and her ROM improved with increased reps of PROM.  Pt is improving with L shoulder PROM as evidenced by goniometric measurements.  Pt responded well to Rx reporting increased pain from 2/10 before Rx to 3/10 after Rx.  Pt should benefit from cont  skilled PT services per protocol to address ongoing goals and improve ROM and tightness.    OBJECTIVE IMPAIRMENTS: decreased activity tolerance, decreased endurance, decreased ROM, decreased strength, hypomobility, impaired flexibility, impaired UE functional use, and pain.   ACTIVITY LIMITATIONS: carrying, lifting, bathing, dressing, reach over head, and hygiene/grooming  PARTICIPATION LIMITATIONS: meal prep, cleaning, laundry, driving, shopping, and community activity  PERSONAL FACTORS: 1 comorbidity: R shoulder arthroscopy  are also affecting patient's functional outcome.   REHAB POTENTIAL: Good  CLINICAL DECISION MAKING: Stable/uncomplicated  EVALUATION COMPLEXITY: Low   GOALS:  SHORT TERM GOALS: Target date: 05/14/2022   Pt will be independent and compliant with HEP for improved pain, ROM, and function.  Baseline: Goal status: INITIAL Target date:  05/21/2022  2.  Pt will demo improved L shoulder PROM to at least 140 deg in flexion and 50 deg in ER for improved shoulder mobility and stiffness.  Baseline:  Goal status: INITIAL Target date:  05/07/2022  3.  Pt will tolerate AAROM as allowed by MD without adverse effects for improved shoulder mobility.  Baseline:  Goal status: INITIAL Target date:  05/14/2022  4.  Pt will wean out of sling without adverse effects.  Baseline:  Goal status: INITIAL Target date:  05/14/2022  5.  Pt will demo L shoulder AAROM in supine to be at least 120  deg in flexion and 50 deg in ER for improved shoulder mobility and stiffness.  Baseline:  Goal status: INITIAL Target date:  05/21/2022  6.  Pt will be able to actively elevate L shoulder at least 90 deg without significant shoulder hike.  Baseline:  Goal status: INITIAL Target date:  06/11/2022  7.  Pt will report she is able to perform her self care activities with no > than minimal difficulty.  Baseline:  Goal status: INITIAL Target date:  06/18/2022    LONG TERM GOALS: Target date: 08/06/2022   Pt will demo L shoulder AROM to be [facility] t/o for performance of ADLs and IADLs.   Baseline:  Goal status: INITIAL  2.  Pt will be able to perform her ADLs and IADLs without significant difficulty and pain.  Baseline:  Goal status: INITIAL  3.  Pt will be able to perform her normal reaching and overhead activities without significant pain and limitation.  Baseline:  Goal status: INITIAL  4.  Pt will be able to perform quilting without adverse effects.  Baseline:  Goal status: INITIAL  5.  Pt will demo improved L shoulder strength to at least 4+/5 MMT t/o for functional carrying/lifting and performance of household chores.  Baseline:  Goal status: INITIAL    PLAN:  PT FREQUENCY:  2x/wk x 12 weeks and 1x/wk x 4 weeks  PT DURATION: other: 12-16 weeks  PLANNED INTERVENTIONS: Therapeutic exercises, Therapeutic activity, Neuromuscular re-education, Patient/Family education, Self Care, Joint mobilization, Aquatic Therapy, Dry Needling, Electrical stimulation, Spinal mobilization, Cryotherapy, Moist heat, scar mobilization, Taping, Ultrasound, Manual therapy, and Re-evaluation  PLAN FOR NEXT SESSION: Cont per RCR protocol with precautions to biceps tenodesis.  PROM x 3 weeks per MD order.    Selinda Michaels III PT, DPT 04/25/22 11:36 AM

## 2022-04-25 ENCOUNTER — Ambulatory Visit (HOSPITAL_BASED_OUTPATIENT_CLINIC_OR_DEPARTMENT_OTHER): Payer: Medicare Other | Admitting: Physical Therapy

## 2022-04-25 ENCOUNTER — Encounter (HOSPITAL_BASED_OUTPATIENT_CLINIC_OR_DEPARTMENT_OTHER): Payer: Self-pay | Admitting: Physical Therapy

## 2022-04-25 DIAGNOSIS — M25512 Pain in left shoulder: Secondary | ICD-10-CM

## 2022-04-25 DIAGNOSIS — M6281 Muscle weakness (generalized): Secondary | ICD-10-CM

## 2022-04-25 DIAGNOSIS — M25612 Stiffness of left shoulder, not elsewhere classified: Secondary | ICD-10-CM

## 2022-04-27 ENCOUNTER — Other Ambulatory Visit: Payer: Self-pay | Admitting: Surgical

## 2022-04-27 ENCOUNTER — Encounter: Payer: Self-pay | Admitting: Surgical

## 2022-04-27 MED ORDER — CYCLOBENZAPRINE HCL 10 MG PO TABS: 10.0000 mg | ORAL_TABLET | Freq: Three times a day (TID) | ORAL | 0 refills | Status: DC | PRN

## 2022-04-27 NOTE — Progress Notes (Signed)
Post-Op Visit Note   Patient: Chelsey Jackson           Date of Birth: May 27, 1950           MRN: [id number] Visit Date: 04/23/2022 PCP: [name], PA   Assessment & Plan:  Chief Complaint:  Chief Complaint  Patient presents with   Left Shoulder - Routine Post Op   Visit Diagnoses:  1. Chronic left shoulder pain   2. Tear of left supraspinatus tendon     Plan: Patient is now about 4 week out left shoulder arthroscopy subacromial decompression and debridement mini open rotator cuff tear repair.  Still having a significant difficulty when it comes to sleeping.  She sleeps for maybe 1 to 2 hours per night.  She has constant aching pain through the left shoulder.  Meds are not really helping despite taking tramadol as well as has been taking Celebrex and Robaxin.  She is not actively lifting the arm.  She is not lifting anything with the arm.  She states she does have some pain in the scapular region as well and some neck discomfort.  On exam, patient has 2+ radial pulse of the operative extremity.  Range of motion 25 degrees X rotation, 60 degrees abduction, 95 degrees forward flexion passively.  She has no coarseness or grinding with passive motion of the shoulder.  Axillary nerve is intact with deltoid firing.  Intact EPL, FPL, finger abduction, grip strength, pronation/supination, bicep, tricep, deltoid.  Does have tenderness throughout the axial cervical spine.  She has stiffness with tilting her head toward her left shoulder but no stiffness toward the right.  Does have reproduction of neck and trap pain with tilting her head to the left.  Negative Spurling sign.  Plan is to continue with range of motion brace and physical therapy.  Plan to hold off on full active range of motion until 6 weeks out from surgery.  Will send message to her physical therapist informing.  Her main concern is difficulty sleeping.  She has had really no relief from oxycodone or tramadol.  We can try a  temporary prescription for Dilaudid 1 mg to see if this will help her sleep and if she responds a little bit better to this medication.  Another thought would be that this is constant unrelenting pain could be related to neck pathology given the difficulty controlling it as well as the distribution of pain with some neck and scapular region pain as well as the shoulder.  She has cervical spine radiographs demonstrating fairly pronounced degenerative changes at C5-C6.  Plan to order MRI cervical spine for further evaluation of cervical radiculopathy.  If she has significant improvement after use of Dilaudid and her symptoms calm down, she may call and cancel the scan.  If not, plan to proceed with MRI.  Follow-up in 4 weeks for clinical recheck and potential review of MRI scan.  She may start active range of motion and rotator cuff strengthening exercises around 6 weeks out from surgery on 05/06/2022.  Follow-Up Instructions: No follow-ups on file.   Orders:  Orders Placed This Encounter  Procedures   XR Cervical Spine 2 or 3 views   MR Cervical Spine w/o contrast   Meds ordered this encounter  Medications   HYDROmorphone (DILAUDID) 2 MG tablet    Sig: Take 0.5 tablets (1 mg total) by mouth every 6 (six) hours as needed for severe pain.    Dispense:  20 tablet  Refill:  0    Imaging: No results found.  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 (SLAP) tear of left shoulder 03/29/2022   Synovitis of left shoulder 03/29/2022   Family history of heart disease 01/07/2022   Allergic rhinitis 09/12/2020   Allergic rhinitis due to animal (cat) (dog) hair and dander 09/12/2020   Mild intermittent asthma 09/12/2020   Vasomotor rhinitis 09/12/2020   Hyperlipidemia, mild 05/17/2020   Impingement syndrome of left shoulder 04/03/2020    Urge incontinence 11/05/2018   Diverticulosis of colon without hemorrhage    Primary osteoarthritis of first carpometacarpal joint of left hand 06/22/2018   Chronic pain of left thumb 06/22/2018   History of retinal tear 10/20/2016   Cough 05/02/2016   Low back pain 09/28/2015   Insomnia 08/18/2013   OSA (obstructive sleep apnea) 07/31/2013   Thyroid nodule, cold 07/15/2012   Anxiety and depression 10/05/2007   Allergic rhinitis due to pollen 10/05/2007   Asthma 10/05/2007   Past Medical History:  Diagnosis Date   AC (acromioclavicular) joint bone spurs    left shoulder with chronic pain, w/limitation ROM   Allergy    weekly allergy shots   Anemia    hx of   Anxiety    Arthritis    Asthma    Depression    Fibrocystic breast disease    GERD (gastroesophageal reflux disease)    OSA (obstructive sleep apnea) 07/31/2013   Peptic ulcer disease    TMJ locking     Family History  Problem Relation Age of Onset   Osteoporosis Mother    Fibroids Mother    Other Mother        aortic valve replacement 1994, abdominal aortic aneurysm   Lung cancer Mother 45   Heart disease Father    Testicular cancer Father 48   Prostate cancer Father 40   Leukemia Father        CLL   Diabetes Maternal Grandmother    Heart disease Maternal Grandfather    Heart disease Paternal Grandmother    Diabetes Paternal Grandfather    Stomach cancer Paternal Grandfather    Breast cancer Paternal Aunt    Breast cancer Cousin    Colon cancer Neg Hx    Throat cancer Neg Hx    Pancreatic cancer Neg Hx     Past Surgical History:  Procedure Laterality Date   ABDOMINAL HYSTERECTOMY  2003   due to fibroids   ADENOIDECTOMY     x3   BICEPT TENODESIS Left 03/25/2022   Procedure: BICEPS TENODESIS;  Surgeon: Meredith Pel, MD;  Location: [place];  Service: Orthopedics;  Laterality: Left;   BIOPSY  06/29/2018   Procedure: BIOPSY;  Surgeon: Lavena Bullion, DO;  Location: [facility];  Service:  Gastroenterology;;   BREAST CYST ASPIRATION     COLONOSCOPY  03/10/2007   results-normal   COLONOSCOPY WITH PROPOFOL N/A 06/29/2018   Procedure: COLONOSCOPY WITH PROPOFOL;  Surgeon: Lavena Bullion, DO;  Location: [facility];  Service: Gastroenterology;  Laterality: N/A;   DILATION AND CURETTAGE OF UTERUS     HAMMER TOE SURGERY     POLYPECTOMY  06/29/2018   Procedure: POLYPECTOMY;  Surgeon: Lavena Bullion, DO;  Location: [facility];  Service: Gastroenterology;;   PUBOVAGINAL SLING  '90's   for incontinence   RHINOPLASTY  1984  ROTATOR CUFF REPAIR Right 2013   [place]   ROTATOR CUFF REPAIR Left 2012   [place]   SHOULDER ARTHROSCOPY WITH ROTATOR CUFF REPAIR AND SUBACROMIAL DECOMPRESSION Left 03/25/2022   Procedure: LEFT SHOULDER ARTHROSCOPY, SUBACROMIAL DECOMPRESSION, DEBRIDEMENT, MINI OPEN ROTATOR CUFF TEAR REPAIR;  Surgeon: Meredith Pel, MD;  Location: [place];  Service: Orthopedics;  Laterality: Left;   TONSILLECTOMY     x2   TUBAL LIGATION     TURBINATE REDUCTION  1997   [facility]RIST SURGERY     Social History   Occupational History   Not on file  Tobacco Use   Smoking status: Former    Packs/day: 2.00    Years: 15.00    Total pack years: 30.00    Types: Cigarettes    Quit date: 11/19/1989    Years since quitting: 32.4   Smokeless tobacco: Never  Vaping Use   Vaping Use: Never used  Substance and Sexual Activity   Alcohol use: Yes    Comment: maybe a glass of wine on the weekend   Drug use: No   Sexual activity: Not on file

## 2022-04-30 ENCOUNTER — Ambulatory Visit (HOSPITAL_BASED_OUTPATIENT_CLINIC_OR_DEPARTMENT_OTHER): Payer: Medicare Other | Admitting: Physical Therapy

## 2022-04-30 DIAGNOSIS — M6281 Muscle weakness (generalized): Secondary | ICD-10-CM

## 2022-04-30 DIAGNOSIS — M25612 Stiffness of left shoulder, not elsewhere classified: Secondary | ICD-10-CM

## 2022-04-30 DIAGNOSIS — M25512 Pain in left shoulder: Secondary | ICD-10-CM

## 2022-04-30 NOTE — Therapy (Signed)
OUTPATIENT PHYSICAL THERAPY SHOULDER TREATMENT    Patient Name: Chelsey Jackson MRN: [id number] DOB:1951-01-24, 72 y.o., female Today's Date: 05/01/2022  END OF SESSION:  PT End of Session - 04/30/22 1033     Visit Number 4    Number of Visits 28    Date for PT Re-Evaluation 07/10/22    Authorization Type MCR A and B    PT Start Time 0940    PT Stop Time 1020    PT Time Calculation (min) 40 min    Activity Tolerance Patient tolerated treatment well    Behavior During Therapy WFL for tasks assessed/performed                Past Medical History:  Diagnosis Date   AC (acromioclavicular) joint bone spurs    left shoulder with chronic pain, w/limitation ROM   Allergy    weekly allergy shots   Anemia    hx of   Anxiety    Arthritis    Asthma    Depression    Fibrocystic breast disease    GERD (gastroesophageal reflux disease)    OSA (obstructive sleep apnea) 07/31/2013   Peptic ulcer disease    TMJ locking    Past Surgical History:  Procedure Laterality Date   ABDOMINAL HYSTERECTOMY  2003   due to fibroids   ADENOIDECTOMY     x3   BICEPT TENODESIS Left 03/25/2022   Procedure: BICEPS TENODESIS;  Surgeon: Meredith Pel, MD;  Location: [place];  Service: Orthopedics;  Laterality: Left;   BIOPSY  06/29/2018   Procedure: BIOPSY;  Surgeon: Lavena Bullion, DO;  Location: [facility];  Service: Gastroenterology;;   BREAST CYST ASPIRATION     COLONOSCOPY  03/10/2007   results-normal   COLONOSCOPY WITH PROPOFOL N/A 06/29/2018   Procedure: COLONOSCOPY WITH PROPOFOL;  Surgeon: Lavena Bullion, DO;  Location: [facility];  Service: Gastroenterology;  Laterality: N/A;   DILATION AND CURETTAGE OF UTERUS     HAMMER TOE SURGERY     POLYPECTOMY  06/29/2018   Procedure: POLYPECTOMY;  Surgeon: Lavena Bullion, DO;  Location: [facility];  Service: Gastroenterology;;   PUBOVAGINAL SLING  '90's   for incontinence   [place]  Right 2013   [place]   ROTATOR CUFF REPAIR Left 2012   [place]   SHOULDER ARTHROSCOPY WITH ROTATOR CUFF REPAIR AND SUBACROMIAL DECOMPRESSION Left 03/25/2022   Procedure: LEFT SHOULDER ARTHROSCOPY, SUBACROMIAL DECOMPRESSION, DEBRIDEMENT, MINI OPEN ROTATOR CUFF TEAR REPAIR;  Surgeon: Meredith Pel, MD;  Location: [place];  Service: Orthopedics;  Laterality: Left;   TONSILLECTOMY     x2   TUBAL LIGATION     [place]   WRIST SURGERY     Patient Active Problem List   Diagnosis Date Noted   Complete tear of left rotator cuff 03/29/2022   Biceps tendinitis on left 03/29/2022   Degenerative superior labral 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 (SLAP) tear of left shoulder 03/29/2022   Synovitis of left shoulder 03/29/2022   Family history of heart disease 01/07/2022   Allergic rhinitis 09/12/2020   Allergic rhinitis due to animal (cat) (dog) hair and dander 09/12/2020   Mild intermittent asthma 09/12/2020   Vasomotor rhinitis 09/12/2020   Hyperlipidemia, mild 05/17/2020   Impingement syndrome of left shoulder 04/03/2020   Urge incontinence 11/05/2018   Diverticulosis of colon without hemorrhage    Primary osteoarthritis of first carpometacarpal joint of left hand 06/22/2018   Chronic pain of  left thumb 06/22/2018   History of retinal tear 10/20/2016   Cough 05/02/2016   Low back pain 09/28/2015   Insomnia 08/18/2013   OSA (obstructive sleep apnea) 07/31/2013   Thyroid nodule, cold 07/15/2012   Anxiety and depression 10/05/2007   Allergic rhinitis due to pollen 10/05/2007   Asthma 10/05/2007    PCP: [name], PA   REFERRING PROVIDER: Meredith Pel, MD  REFERRING DIAG: [phone] (ICD-10-CM) - Tear of left supraspinatus tendon  THERAPY DIAG:  Left shoulder pain, unspecified chronicity  Stiffness of left shoulder, not elsewhere classified  Muscle weakness (generalized)  Rationale for Evaluation and Treatment: Rehabilitation  ONSET DATE: DOS  03/25/2022  SUBJECTIVE:                                                                                                                                                                                      SUBJECTIVE STATEMENT: Pt is 5 weeks and 1 day s/p L shoulder RCR, biceps tenodesis, and labral debridement.  Pt saw PA last week and she was informed she didn't need to wear the sling in her home, but to wear it outside of her home.  Note indicated PA assessing cervical and states she has deg changes at C5-6 and is ordering a cervical MRI.  Pt may start AROM and RTC strengthening 6 weeks post op (05/06/22).  Pt sent a message to PA concerning medication not working.  PA prescribed flexeril and she slept great that night.  Pt was in the kitchen yesterday and she instinctively reached out to grab an object that was falling.  Pt reports having increased pain since that incident and had much difficulty sleeping last night.  Pt is using flexeril at night but not taking pain meds.  She did take Aleve this AM.   PT received message from PA: As far as progressing with therapy, she is okay to continue going with passive range of motion and active assisted range of motion of the operative shoulder.  She can start some full active range of motion of the shoulder when she is 6 weeks out from procedure on 05/06/2022.  Also okay to start some very gentle rotator cuff strengthening exercises at that point and progress from there.   Pt reports compliance with HEP.  She is still using the ROM chair, but they are coming to pick it up soon.  Pt is limited with all of her self care activities, ADLs, and IADLs.  Pt is limited per protocol and unable to perform any reaching and overhead activities.   PERTINENT HISTORY: L shoulder mini open RCR, biceps tenodesis, and labral debridement on 03/25/22.  PROM x  3 weeks Hx of bilat shoulder arthroscopies Depression and Anxiety which is controlled  PAIN:  She states she has more  discomfort than pain.  Pt states it's aching, not really a pain.   PRECAUTIONS: Other: surgical protocol; PROM currently  WEIGHT BEARING RESTRICTIONS: Yes L UE  FALLS:  Has patient fallen in last 6 months? No   OCCUPATION: Pt is retired  PLOF: Independent.  Pt was able to perform her ADLs and IADLs independently.  Pt performs water aerobics 3x/wk.  Pt enjoys quilting and embroidering.    PATIENT GOALS: to fasten bra in the back.  To return to water aerobics and be able to quilt.  Return to her normal activities including driving.     OBJECTIVE:   DIAGNOSTIC FINDINGS:  Pt is post op.      TODAY'S TREATMENT:           OBSERVATION: Pt wearing sling.    Pt is R hand dominant.  UPPER EXTREMITY ROM:    ROM Right eval Left eval Left 1/5 Left 1/10  Shoulder flexion AROM: 156 PROM: 96 PROM:  128 PROM:  134  Shoulder scaption AROM: 155     Shoulder abduction AROM: 121   PROM: 81  Shoulder adduction      Shoulder internal rotation      Shoulder external rotation  PROM: 24 PROM: 37 PROM:  47   Elbow flexion      Elbow extension      Wrist flexion      Wrist extension      Wrist ulnar deviation      Wrist radial deviation      Wrist pronation      Wrist supination      (Blank rows = not tested)                                                                                                                             Reviewed response to prior Rx, HEP compliance, and pain level. Pt performed: Pendulums 2x10 cw and ccw Attempted supine clasped hands flexion AAROM Seated table slides in flexion 2x10  Pt received a HEP handout and was educated in correct form and appropriate frequency.  PT instructed pt to not perform into a painful or tight range.  Pt received gentle L shoulder oscillations f/b L shoulder flexion, scaption, abd, IR, and ER PROM per pt and tissue tolerance w/n protocol ranges.   Assessed ROM.     PATIENT EDUCATION: Education details:  HEP, POC,  objective findings and progress.  Person educated: Patient Education method: Explanation, Demonstration, Tactile cues, Verbal cues Education comprehension: verbalized understanding, returned demonstration, verbal cues required, tactile cues required, and needs further education  HOME EXERCISE PROGRAM: Access Code: [email or web address] URL: [email or web address] Date: 04/16/2022 Prepared by: Ronny Flurry  Exercises - Circular Shoulder Pendulum with Table Support  - 2-3 x daily - 7 x weekly - 2-3 sets - 10 reps - Wrist AROM Flexion  Extension  - 3 x daily - 7 x weekly - 2-3 sets - 10 reps - Seated Gripping Towel  - 2-3 x daily - 7 x weekly - 2 sets - 10 reps  Updated HEP: - Seated Shoulder Flexion Towel Slide at Table Top  - 2 x daily - 7 x weekly - 2 sets - 10 reps  ASSESSMENT:  CLINICAL IMPRESSION:  Pt reports improved sleeping with flexeril.  She had increased pain after instinctively reaching out to grab a falling object yesterday.  She states she is now having more discomfort.  Pt is progressing with PROM and tolerated PROM well.  She demonstrates improved PROM as evidenced by subjective reports.  PT received message from PA to begin [facility] and wait on AROM until 05/06/2022.  PT attempted supine clasped hands flexion though pt unable to perform well due to reported fatigued and weakness.  PT had pt stop due to very minimal ROM.  Pt able to perform table slides well and states it feels really good.  Pt responded well to Rx having no c/o's after Rx and stating her shoulder felt really good after table slides.  Pt should benefit from cont skilled PT services per protocol to address ongoing goals and improve ROM and tightness.  OBJECTIVE IMPAIRMENTS: decreased activity tolerance, decreased endurance, decreased ROM, decreased strength, hypomobility, impaired flexibility, impaired UE functional use, and pain.   ACTIVITY LIMITATIONS: carrying, lifting, bathing, dressing, reach over head, and  hygiene/grooming  PARTICIPATION LIMITATIONS: meal prep, cleaning, laundry, driving, shopping, and community activity  PERSONAL FACTORS: 1 comorbidity: R shoulder arthroscopy  are also affecting patient's functional outcome.   REHAB POTENTIAL: Good  CLINICAL DECISION MAKING: Stable/uncomplicated  EVALUATION COMPLEXITY: Low   GOALS:  SHORT TERM GOALS: Target date: 05/14/2022   Pt will be independent and compliant with HEP for improved pain, ROM, and function.  Baseline: Goal status: INITIAL Target date:  05/21/2022  2.  Pt will demo improved L shoulder PROM to at least 140 deg in flexion and 50 deg in ER for improved shoulder mobility and stiffness.  Baseline:  Goal status: INITIAL Target date:  05/07/2022  3.  Pt will tolerate AAROM as allowed by MD without adverse effects for improved shoulder mobility.  Baseline:  Goal status: INITIAL Target date:  05/14/2022  4.  Pt will wean out of sling without adverse effects.  Baseline:  Goal status: INITIAL Target date:  05/14/2022  5.  Pt will demo L shoulder AAROM in supine to be at least 120 deg in flexion and 50 deg in ER for improved shoulder mobility and stiffness.  Baseline:  Goal status: INITIAL Target date:  05/21/2022  6.  Pt will be able to actively elevate L shoulder at least 90 deg without significant shoulder hike.  Baseline:  Goal status: INITIAL Target date:  06/11/2022  7.  Pt will report she is able to perform her self care activities with no > than minimal difficulty.  Baseline:  Goal status: INITIAL Target date:  06/18/2022    LONG TERM GOALS: Target date: 08/06/2022   Pt will demo L shoulder AROM to be [facility] t/o for performance of ADLs and IADLs.   Baseline:  Goal status: INITIAL  2.  Pt will be able to perform her ADLs and IADLs without significant difficulty and pain.  Baseline:  Goal status: INITIAL  3.  Pt will be able to perform her normal reaching and overhead activities without significant pain  and limitation.  Baseline:  Goal  status: INITIAL  4.  Pt will be able to perform quilting without adverse effects.  Baseline:  Goal status: INITIAL  5.  Pt will demo improved L shoulder strength to at least 4+/5 MMT t/o for functional carrying/lifting and performance of household chores.  Baseline:  Goal status: INITIAL    PLAN:  PT FREQUENCY:  2x/wk x 12 weeks and 1x/wk x 4 weeks  PT DURATION: other: 12-16 weeks  PLANNED INTERVENTIONS: Therapeutic exercises, Therapeutic activity, Neuromuscular re-education, Patient/Family education, Self Care, Joint mobilization, Aquatic Therapy, Dry Needling, Electrical stimulation, Spinal mobilization, Cryotherapy, Moist heat, scar mobilization, Taping, Ultrasound, Manual therapy, and Re-evaluation  PLAN FOR NEXT SESSION: Cont per RCR protocol with precautions to biceps tenodesis.  Cont with PROM and AAROM.   PT received message from PA:  As far as progressing with therapy, she is okay to continue going with passive range of motion and active assisted range of motion of the operative shoulder.  She can start some full active range of motion of the shoulder when she is 6 weeks out from procedure on 05/06/2022.  Also okay to start some very gentle rotator cuff strengthening exercises at that point and progress from there.   Selinda Michaels III PT, DPT 05/01/22 11:23 AM

## 2022-05-01 ENCOUNTER — Encounter (HOSPITAL_BASED_OUTPATIENT_CLINIC_OR_DEPARTMENT_OTHER): Payer: Self-pay | Admitting: Physical Therapy

## 2022-05-02 ENCOUNTER — Ambulatory Visit (HOSPITAL_BASED_OUTPATIENT_CLINIC_OR_DEPARTMENT_OTHER): Payer: Medicare Other | Admitting: Physical Therapy

## 2022-05-02 ENCOUNTER — Encounter (HOSPITAL_BASED_OUTPATIENT_CLINIC_OR_DEPARTMENT_OTHER): Payer: Self-pay | Admitting: Physical Therapy

## 2022-05-02 DIAGNOSIS — M25512 Pain in left shoulder: Secondary | ICD-10-CM

## 2022-05-02 DIAGNOSIS — M6281 Muscle weakness (generalized): Secondary | ICD-10-CM

## 2022-05-02 DIAGNOSIS — M25612 Stiffness of left shoulder, not elsewhere classified: Secondary | ICD-10-CM

## 2022-05-02 NOTE — Therapy (Signed)
OUTPATIENT PHYSICAL THERAPY SHOULDER TREATMENT    Patient Name: Chelsey Jackson MRN: [id number] DOB:07/01/1950, 72 y.o., female Today's Date: 05/02/2022  END OF SESSION:  PT End of Session - 05/02/22 1542     Visit Number 5    Number of Visits 28    Date for PT Re-Evaluation 07/10/22    Authorization Type MCR A and B    PT Start Time 1508    PT Stop Time 1549    PT Time Calculation (min) 41 min    Activity Tolerance Patient tolerated treatment well    Behavior During Therapy WFL for tasks assessed/performed                 Past Medical History:  Diagnosis Date   AC (acromioclavicular) joint bone spurs    left shoulder with chronic pain, w/limitation ROM   Allergy    weekly allergy shots   Anemia    hx of   Anxiety    Arthritis    Asthma    Depression    Fibrocystic breast disease    GERD (gastroesophageal reflux disease)    OSA (obstructive sleep apnea) 07/31/2013   Peptic ulcer disease    TMJ locking    Past Surgical History:  Procedure Laterality Date   ABDOMINAL HYSTERECTOMY  2003   due to fibroids   ADENOIDECTOMY     x3   BICEPT TENODESIS Left 03/25/2022   Procedure: BICEPS TENODESIS;  Surgeon: Meredith Pel, MD;  Location: [place];  Service: Orthopedics;  Laterality: Left;   BIOPSY  06/29/2018   Procedure: BIOPSY;  Surgeon: Lavena Bullion, DO;  Location: [facility];  Service: Gastroenterology;;   BREAST CYST ASPIRATION     COLONOSCOPY  03/10/2007   results-normal   COLONOSCOPY WITH PROPOFOL N/A 06/29/2018   Procedure: COLONOSCOPY WITH PROPOFOL;  Surgeon: Lavena Bullion, DO;  Location: [facility];  Service: Gastroenterology;  Laterality: N/A;   DILATION AND CURETTAGE OF UTERUS     HAMMER TOE SURGERY     POLYPECTOMY  06/29/2018   Procedure: POLYPECTOMY;  Surgeon: Lavena Bullion, DO;  Location: [facility];  Service: Gastroenterology;;   PUBOVAGINAL SLING  '90's   for incontinence   [place]  Right 2013   [place]   ROTATOR CUFF REPAIR Left 2012   [place]   SHOULDER ARTHROSCOPY WITH ROTATOR CUFF REPAIR AND SUBACROMIAL DECOMPRESSION Left 03/25/2022   Procedure: LEFT SHOULDER ARTHROSCOPY, SUBACROMIAL DECOMPRESSION, DEBRIDEMENT, MINI OPEN ROTATOR CUFF TEAR REPAIR;  Surgeon: Meredith Pel, MD;  Location: [place];  Service: Orthopedics;  Laterality: Left;   TONSILLECTOMY     x2   TUBAL LIGATION     [place]   WRIST SURGERY     Patient Active Problem List   Diagnosis Date Noted   Complete tear of left rotator cuff 03/29/2022   Biceps tendinitis on left 03/29/2022   Degenerative superior labral 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 (SLAP) tear of left shoulder 03/29/2022   Synovitis of left shoulder 03/29/2022   Family history of heart disease 01/07/2022   Allergic rhinitis 09/12/2020   Allergic rhinitis due to animal (cat) (dog) hair and dander 09/12/2020   Mild intermittent asthma 09/12/2020   Vasomotor rhinitis 09/12/2020   Hyperlipidemia, mild 05/17/2020   Impingement syndrome of left shoulder 04/03/2020   Urge incontinence 11/05/2018   Diverticulosis of colon without hemorrhage    Primary osteoarthritis of first carpometacarpal joint of left hand 06/22/2018   Chronic pain  of left thumb 06/22/2018   History of retinal tear 10/20/2016   Cough 05/02/2016   Low back pain 09/28/2015   Insomnia 08/18/2013   OSA (obstructive sleep apnea) 07/31/2013   Thyroid nodule, cold 07/15/2012   Anxiety and depression 10/05/2007   Allergic rhinitis due to pollen 10/05/2007   Asthma 10/05/2007    PCP: [name], PA   REFERRING PROVIDER: Meredith Pel, MD  REFERRING DIAG: [phone] (ICD-10-CM) - Tear of left supraspinatus tendon  THERAPY DIAG:  Left shoulder pain, unspecified chronicity  Stiffness of left shoulder, not elsewhere classified  Muscle weakness (generalized)  Rationale for Evaluation and Treatment: Rehabilitation  ONSET DATE: DOS  03/25/2022  SUBJECTIVE:                                                                                                                                                                                      SUBJECTIVE STATEMENT:  Shoulder is feeling better, I think the muscle relaxer is really helping as it has allowed me to sleep and sleep really makes a difference. I have been trying new things, [facility] [place] the recumbent bike for 15 minutes, and I also was able to feed fabric into my sewing machine the other day with R UE. I ironed a little, its nice to get back to more normal activities. Did everything in my sling so I wouldn't reach with that arm.    Prior session   PT received message from PA: As far as progressing with therapy, she is okay to continue going with passive range of motion and active assisted range of motion of the operative shoulder.  She can start some full active range of motion of the shoulder when she is 6 weeks out from procedure on 05/06/2022.  Also okay to start some very gentle rotator cuff strengthening exercises at that point and progress from there.   Pt reports compliance with HEP.  She is still using the ROM chair, but they are coming to pick it up soon.  Pt is limited with all of her self care activities, ADLs, and IADLs.  Pt is limited per protocol and unable to perform any reaching and overhead activities.   PERTINENT HISTORY: L shoulder mini open RCR, biceps tenodesis, and labral debridement on 03/25/22.  PROM x 3 weeks Hx of bilat shoulder arthroscopies Depression and Anxiety which is controlled  PAIN:  No, NPRS 0/10  PRECAUTIONS: Other: surgical protocol; PROM currently  WEIGHT BEARING RESTRICTIONS: Yes L UE  FALLS:  Has patient fallen in last 6 months? No   OCCUPATION: Pt is retired  PLOF: Independent.  Pt was able to perform her ADLs and IADLs independently.  Pt performs water aerobics 3x/wk.  Pt enjoys  quilting and embroidering.    PATIENT GOALS: to fasten bra in the back.  To return to water aerobics and be able to quilt.  Return to her normal activities including driving.     OBJECTIVE:   DIAGNOSTIC FINDINGS:  Pt is post op.      TODAY'S TREATMENT:            05/02/22  Manual  PROM surgical shoulder within all limits of protocol    TherEx  AAROM x10 with cane flexion within ROM restrictions  AAROM x10 with 50% assist from PT within ROM restrictions  AAROM x10 ER seated with elbow tucked to side about 20* range  Chin tucks x15 B upper trap stretches 3x30 seconds B      PATIENT EDUCATION: Education details:  HEP, POC, objective findings and progress.  Person educated: Patient Education method: Explanation, Demonstration, Tactile cues, Verbal cues Education comprehension: verbalized understanding, returned demonstration, verbal cues required, tactile cues required, and needs further education  HOME EXERCISE PROGRAM: Access Code: [email or web address] URL: [email or web address] Date: 04/16/2022 Prepared by: Ronny Flurry  Exercises - Circular Shoulder Pendulum with Table Support  - 2-3 x daily - 7 x weekly - 2-3 sets - 10 reps - Wrist AROM Flexion Extension  - 3 x daily - 7 x weekly - 2-3 sets - 10 reps - Seated Gripping Towel  - 2-3 x daily - 7 x weekly - 2 sets - 10 reps  Updated HEP: - Seated Shoulder Flexion Towel Slide at Table Top  - 2 x daily - 7 x weekly - 2 sets - 10 reps  ASSESSMENT:  CLINICAL IMPRESSION:  Precious Bard arrives today doing well, still feeling much better after getting more sleep. Spent some time working on PROM within precautions, after that worked on some limited [place] as per instructions from [place]. UE still very weak although AAROM was better today, no increased pain but was very easily fatigued especially during new exercises today. Will continue to progress per protocol/PA advice.   OBJECTIVE IMPAIRMENTS: decreased activity tolerance,  decreased endurance, decreased ROM, decreased strength, hypomobility, impaired flexibility, impaired UE functional use, and pain.   ACTIVITY LIMITATIONS: carrying, lifting, bathing, dressing, reach over head, and hygiene/grooming  PARTICIPATION LIMITATIONS: meal prep, cleaning, laundry, driving, shopping, and community activity  PERSONAL FACTORS: 1 comorbidity: R shoulder arthroscopy  are also affecting patient's functional outcome.   REHAB POTENTIAL: Good  CLINICAL DECISION MAKING: Stable/uncomplicated  EVALUATION COMPLEXITY: Low   GOALS:  SHORT TERM GOALS: Target date: 05/14/2022   Pt will be independent and compliant with HEP for improved pain, ROM, and function.  Baseline: Goal status: INITIAL Target date:  05/21/2022  2.  Pt will demo improved L shoulder PROM to at least 140 deg in flexion and 50 deg in ER for improved shoulder mobility and stiffness.  Baseline:  Goal status: INITIAL Target date:  05/07/2022  3.  Pt will tolerate AAROM as allowed by MD without adverse effects for improved shoulder mobility.  Baseline:  Goal status: INITIAL Target date:  05/14/2022  4.  Pt will wean out of sling without adverse effects.  Baseline:  Goal status: INITIAL Target date:  05/14/2022  5.  Pt will demo L shoulder AAROM in supine to be at least 120 deg in flexion and 50 deg in ER for improved shoulder mobility and stiffness.  Baseline:  Goal status: INITIAL Target date:  05/21/2022  6.  Pt will be able to actively elevate L shoulder at least 90 deg without significant shoulder hike.  Baseline:  Goal status: INITIAL Target date:  06/11/2022  7.  Pt will report she is able to perform her self care activities with no > than minimal difficulty.  Baseline:  Goal status: INITIAL Target date:  06/18/2022    LONG TERM GOALS: Target date: 08/06/2022   Pt will demo L shoulder AROM to be [facility] t/o for performance of ADLs and IADLs.   Baseline:  Goal status: INITIAL  2.  Pt will  be able to perform her ADLs and IADLs without significant difficulty and pain.  Baseline:  Goal status: INITIAL  3.  Pt will be able to perform her normal reaching and overhead activities without significant pain and limitation.  Baseline:  Goal status: INITIAL  4.  Pt will be able to perform quilting without adverse effects.  Baseline:  Goal status: INITIAL  5.  Pt will demo improved L shoulder strength to at least 4+/5 MMT t/o for functional carrying/lifting and performance of household chores.  Baseline:  Goal status: INITIAL    PLAN:  PT FREQUENCY:  2x/wk x 12 weeks and 1x/wk x 4 weeks  PT DURATION: other: 12-16 weeks  PLANNED INTERVENTIONS: Therapeutic exercises, Therapeutic activity, Neuromuscular re-education, Patient/Family education, Self Care, Joint mobilization, Aquatic Therapy, Dry Needling, Electrical stimulation, Spinal mobilization, Cryotherapy, Moist heat, scar mobilization, Taping, Ultrasound, Manual therapy, and Re-evaluation  PLAN FOR NEXT SESSION: Cont per RCR protocol with precautions to biceps tenodesis.  Cont with PROM and AAROM.   PT received message from PA:  As far as progressing with therapy, she is okay to continue going with passive range of motion and active assisted range of motion of the operative shoulder.  She can start some full active range of motion of the shoulder when she is 6 weeks out from procedure on 05/06/2022.  Also okay to start some very gentle rotator cuff strengthening exercises at that point and progress from there.   Deniece Ree PT DPT PN2

## 2022-05-06 ENCOUNTER — Encounter (HOSPITAL_BASED_OUTPATIENT_CLINIC_OR_DEPARTMENT_OTHER): Payer: Medicare Other | Admitting: Physical Therapy

## 2022-05-08 ENCOUNTER — Ambulatory Visit (HOSPITAL_BASED_OUTPATIENT_CLINIC_OR_DEPARTMENT_OTHER): Payer: Medicare Other | Admitting: Physical Therapy

## 2022-05-08 ENCOUNTER — Encounter (HOSPITAL_BASED_OUTPATIENT_CLINIC_OR_DEPARTMENT_OTHER): Payer: Self-pay | Admitting: Physical Therapy

## 2022-05-08 DIAGNOSIS — M25512 Pain in left shoulder: Secondary | ICD-10-CM | POA: Diagnosis not present

## 2022-05-08 DIAGNOSIS — M25612 Stiffness of left shoulder, not elsewhere classified: Secondary | ICD-10-CM

## 2022-05-08 DIAGNOSIS — M6281 Muscle weakness (generalized): Secondary | ICD-10-CM

## 2022-05-08 NOTE — Therapy (Signed)
OUTPATIENT PHYSICAL THERAPY SHOULDER TREATMENT    Patient Name: Chelsey Jackson MRN: [id number] DOB:1950/11/28, 72 y.o., female Today's Date: 05/08/2022  END OF SESSION:  PT End of Session - 05/08/22 1559     Visit Number 6    Number of Visits 28    Date for PT Re-Evaluation 07/10/22    Authorization Type MCR A and B    PT Start Time 4098    PT Stop Time 1556    PT Time Calculation (min) 40 min    Activity Tolerance Patient tolerated treatment well    Behavior During Therapy WFL for tasks assessed/performed                  Past Medical History:  Diagnosis Date   AC (acromioclavicular) joint bone spurs    left shoulder with chronic pain, w/limitation ROM   Allergy    weekly allergy shots   Anemia    hx of   Anxiety    Arthritis    Asthma    Depression    Fibrocystic breast disease    GERD (gastroesophageal reflux disease)    OSA (obstructive sleep apnea) 07/31/2013   Peptic ulcer disease    TMJ locking    Past Surgical History:  Procedure Laterality Date   ABDOMINAL HYSTERECTOMY  2003   due to fibroids   ADENOIDECTOMY     x3   BICEPT TENODESIS Left 03/25/2022   Procedure: BICEPS TENODESIS;  Surgeon: Meredith Pel, MD;  Location: [place];  Service: Orthopedics;  Laterality: Left;   BIOPSY  06/29/2018   Procedure: BIOPSY;  Surgeon: Lavena Bullion, DO;  Location: [facility];  Service: Gastroenterology;;   BREAST CYST ASPIRATION     COLONOSCOPY  03/10/2007   results-normal   COLONOSCOPY WITH PROPOFOL N/A 06/29/2018   Procedure: COLONOSCOPY WITH PROPOFOL;  Surgeon: Lavena Bullion, DO;  Location: [facility];  Service: Gastroenterology;  Laterality: N/A;   DILATION AND CURETTAGE OF UTERUS     HAMMER TOE SURGERY     POLYPECTOMY  06/29/2018   Procedure: POLYPECTOMY;  Surgeon: Lavena Bullion, DO;  Location: [facility];  Service: Gastroenterology;;   PUBOVAGINAL SLING  '90's   for incontinence   [place]  Right 2013   [place]   ROTATOR CUFF REPAIR Left 2012   [place]   SHOULDER ARTHROSCOPY WITH ROTATOR CUFF REPAIR AND SUBACROMIAL DECOMPRESSION Left 03/25/2022   Procedure: LEFT SHOULDER ARTHROSCOPY, SUBACROMIAL DECOMPRESSION, DEBRIDEMENT, MINI OPEN ROTATOR CUFF TEAR REPAIR;  Surgeon: Meredith Pel, MD;  Location: [place];  Service: Orthopedics;  Laterality: Left;   TONSILLECTOMY     x2   TUBAL LIGATION     [place]   WRIST SURGERY     Patient Active Problem List   Diagnosis Date Noted   Complete tear of left rotator cuff 03/29/2022   Biceps tendinitis on left 03/29/2022   Degenerative superior labral 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 (SLAP) tear of left shoulder 03/29/2022   Synovitis of left shoulder 03/29/2022   Family history of heart disease 01/07/2022   Allergic rhinitis 09/12/2020   Allergic rhinitis due to animal (cat) (dog) hair and dander 09/12/2020   Mild intermittent asthma 09/12/2020   Vasomotor rhinitis 09/12/2020   Hyperlipidemia, mild 05/17/2020   Impingement syndrome of left shoulder 04/03/2020   Urge incontinence 11/05/2018   Diverticulosis of colon without hemorrhage    Primary osteoarthritis of first carpometacarpal joint of left hand 06/22/2018   Chronic  pain of left thumb 06/22/2018   History of retinal tear 10/20/2016   Cough 05/02/2016   Low back pain 09/28/2015   Insomnia 08/18/2013   OSA (obstructive sleep apnea) 07/31/2013   Thyroid nodule, cold 07/15/2012   Anxiety and depression 10/05/2007   Allergic rhinitis due to pollen 10/05/2007   Asthma 10/05/2007    PCP: [name], PA   REFERRING PROVIDER: Meredith Pel, MD  REFERRING DIAG: [phone] (ICD-10-CM) - Tear of left supraspinatus tendon  THERAPY DIAG:  Left shoulder pain, unspecified chronicity  Stiffness of left shoulder, not elsewhere classified  Muscle weakness (generalized)  Rationale for Evaluation and Treatment: Rehabilitation  ONSET DATE: DOS  03/25/2022  SUBJECTIVE:                                                                                                                                                                                      SUBJECTIVE STATEMENT:  Shoulder felt fine after last time. Have been working with the cane with active assisted work, I feel very weak today. Was busy today and didn't have as much time to do regular HEP today before therapy.   Prior session   PT received message from PA: As far as progressing with therapy, she is okay to continue going with passive range of motion and active assisted range of motion of the operative shoulder.  She can start some full active range of motion of the shoulder when she is 6 weeks out from procedure on 05/06/2022.  Also okay to start some very gentle rotator cuff strengthening exercises at that point and progress from there.   Pt reports compliance with HEP.  She is still using the ROM chair, but they are coming to pick it up soon.  Pt is limited with all of her self care activities, ADLs, and IADLs.  Pt is limited per protocol and unable to perform any reaching and overhead activities.   PERTINENT HISTORY: L shoulder mini open RCR, biceps tenodesis, and labral debridement on 03/25/22.  PROM x 3 weeks Hx of bilat shoulder arthroscopies Depression and Anxiety which is controlled  PAIN:  No, NPRS 0/10  PRECAUTIONS: Other: surgical protocol; PROM currently  WEIGHT BEARING RESTRICTIONS: Yes L UE  FALLS:  Has patient fallen in last 6 months? No   OCCUPATION: Pt is retired  PLOF: Independent.  Pt was able to perform her ADLs and IADLs independently.  Pt performs water aerobics 3x/wk.  Pt enjoys quilting and embroidering.    PATIENT GOALS: to fasten bra in the back.  To return to water aerobics and be able to quilt.  Return to her normal activities including driving.  OBJECTIVE:   DIAGNOSTIC FINDINGS:  Pt is post op.      TODAY'S TREATMENT:         05/08/22  TherEx  Flexion AAROM supine x15 Scaption AAROM supine x10 manual cues/guidance by PT Flexion AROM x10 limited by pain Seated AAROM ER x15 towel pinch under elbow  Seated ER AROM x15 Scapular retractions x15 3 second holds  Isometrics x10 flexion, ABD  x50%   Manual     PROM surgical shoulder within all limits of protocol    05/02/22  Manual  PROM surgical shoulder within all limits of protocol    TherEx  AAROM x10 with cane flexion within ROM restrictions  AAROM x10 with 50% assist from PT within ROM restrictions  AAROM x10 ER seated with elbow tucked to side about 20* range  Chin tucks x15 B upper trap stretches 3x30 seconds B      PATIENT EDUCATION: Education details:  HEP, POC, objective findings and progress.  Person educated: Patient Education method: Explanation, Demonstration, Tactile cues, Verbal cues Education comprehension: verbalized understanding, returned demonstration, verbal cues required, tactile cues required, and needs further education  HOME EXERCISE PROGRAM: Access Code: [email or web address] URL: [email or web address] Date: 04/16/2022 Prepared by: Ronny Flurry  Exercises - Circular Shoulder Pendulum with Table Support  - 2-3 x daily - 7 x weekly - 2-3 sets - 10 reps - Wrist AROM Flexion Extension  - 3 x daily - 7 x weekly - 2-3 sets - 10 reps - Seated Gripping Towel  - 2-3 x daily - 7 x weekly - 2 sets - 10 reps  Updated HEP: - Seated Shoulder Flexion Towel Slide at Table Top  - 2 x daily - 7 x weekly - 2 sets - 10 reps  ASSESSMENT:  CLINICAL IMPRESSION:  Chelsey Jackson arrives feeling well today, we kept working on ROM today and progressed into light AROM and light strengthening today as well. Did have some increased pain with new activities today. Education provided on tennis ball massage at home. Incisions look good.   OBJECTIVE IMPAIRMENTS: decreased activity tolerance, decreased endurance, decreased ROM, decreased  strength, hypomobility, impaired flexibility, impaired UE functional use, and pain.   ACTIVITY LIMITATIONS: carrying, lifting, bathing, dressing, reach over head, and hygiene/grooming  PARTICIPATION LIMITATIONS: meal prep, cleaning, laundry, driving, shopping, and community activity  PERSONAL FACTORS: 1 comorbidity: R shoulder arthroscopy  are also affecting patient's functional outcome.   REHAB POTENTIAL: Good  CLINICAL DECISION MAKING: Stable/uncomplicated  EVALUATION COMPLEXITY: Low   GOALS:  SHORT TERM GOALS: Target date: 05/14/2022   Pt will be independent and compliant with HEP for improved pain, ROM, and function.  Baseline: Goal status: INITIAL Target date:  05/21/2022  2.  Pt will demo improved L shoulder PROM to at least 140 deg in flexion and 50 deg in ER for improved shoulder mobility and stiffness.  Baseline:  Goal status: INITIAL Target date:  05/07/2022  3.  Pt will tolerate AAROM as allowed by MD without adverse effects for improved shoulder mobility.  Baseline:  Goal status: INITIAL Target date:  05/14/2022  4.  Pt will wean out of sling without adverse effects.  Baseline:  Goal status: INITIAL Target date:  05/14/2022  5.  Pt will demo L shoulder AAROM in supine to be at least 120 deg in flexion and 50 deg in ER for improved shoulder mobility and stiffness.  Baseline:  Goal status: INITIAL Target date:  05/21/2022  6.  Pt will be able to actively elevate L shoulder  at least 90 deg without significant shoulder hike.  Baseline:  Goal status: INITIAL Target date:  06/11/2022  7.  Pt will report she is able to perform her self care activities with no > than minimal difficulty.  Baseline:  Goal status: INITIAL Target date:  06/18/2022    LONG TERM GOALS: Target date: 08/06/2022   Pt will demo L shoulder AROM to be [facility] t/o for performance of ADLs and IADLs.   Baseline:  Goal status: INITIAL  2.  Pt will be able to perform her ADLs and IADLs without  significant difficulty and pain.  Baseline:  Goal status: INITIAL  3.  Pt will be able to perform her normal reaching and overhead activities without significant pain and limitation.  Baseline:  Goal status: INITIAL  4.  Pt will be able to perform quilting without adverse effects.  Baseline:  Goal status: INITIAL  5.  Pt will demo improved L shoulder strength to at least 4+/5 MMT t/o for functional carrying/lifting and performance of household chores.  Baseline:  Goal status: INITIAL    PLAN:  PT FREQUENCY:  2x/wk x 12 weeks and 1x/wk x 4 weeks  PT DURATION: other: 12-16 weeks  PLANNED INTERVENTIONS: Therapeutic exercises, Therapeutic activity, Neuromuscular re-education, Patient/Family education, Self Care, Joint mobilization, Aquatic Therapy, Dry Needling, Electrical stimulation, Spinal mobilization, Cryotherapy, Moist heat, scar mobilization, Taping, Ultrasound, Manual therapy, and Re-evaluation  PLAN FOR NEXT SESSION: Cont per RCR protocol with precautions to biceps tenodesis.  Cont with PROM and AAROM.   PT received message from PA:  As far as progressing with therapy, she is okay to continue going with passive range of motion and active assisted range of motion of the operative shoulder.  She can start some full active range of motion of the shoulder when she is 6 weeks out from procedure on 05/06/2022.  Also okay to start some very gentle rotator cuff strengthening exercises at that point and progress from there.   Deniece Ree PT DPT PN2

## 2022-05-13 ENCOUNTER — Ambulatory Visit (HOSPITAL_BASED_OUTPATIENT_CLINIC_OR_DEPARTMENT_OTHER): Payer: Medicare Other | Admitting: Physical Therapy

## 2022-05-13 ENCOUNTER — Encounter (HOSPITAL_BASED_OUTPATIENT_CLINIC_OR_DEPARTMENT_OTHER): Payer: Self-pay | Admitting: Physical Therapy

## 2022-05-13 DIAGNOSIS — M25512 Pain in left shoulder: Secondary | ICD-10-CM

## 2022-05-13 DIAGNOSIS — M25612 Stiffness of left shoulder, not elsewhere classified: Secondary | ICD-10-CM

## 2022-05-13 DIAGNOSIS — M6281 Muscle weakness (generalized): Secondary | ICD-10-CM

## 2022-05-13 NOTE — Therapy (Signed)
OUTPATIENT PHYSICAL THERAPY SHOULDER TREATMENT    Patient Name: Chelsey Jackson MRN: [id number] DOB:07/14/1950, 72 y.o., female Today's Date: 05/13/2022  END OF SESSION:  PT End of Session - 05/13/22 0904     Visit Number 7    Number of Visits 28    Date for PT Re-Evaluation 07/10/22    Authorization Type MCR A and B    PT Start Time 0847    PT Stop Time 0927    PT Time Calculation (min) 40 min    Activity Tolerance Patient tolerated treatment well    Behavior During Therapy [facility] for tasks assessed/performed                   Past Medical History:  Diagnosis Date   AC (acromioclavicular) joint bone spurs    left shoulder with chronic pain, w/limitation ROM   Allergy    weekly allergy shots   Anemia    hx of   Anxiety    Arthritis    Asthma    Depression    Fibrocystic breast disease    GERD (gastroesophageal reflux disease)    OSA (obstructive sleep apnea) 07/31/2013   Peptic ulcer disease    TMJ locking    Past Surgical History:  Procedure Laterality Date   ABDOMINAL HYSTERECTOMY  2003   due to fibroids   ADENOIDECTOMY     x3   BICEPT TENODESIS Left 03/25/2022   Procedure: BICEPS TENODESIS;  Surgeon: Meredith Pel, MD;  Location: [place];  Service: Orthopedics;  Laterality: Left;   BIOPSY  06/29/2018   Procedure: BIOPSY;  Surgeon: Lavena Bullion, DO;  Location: [facility];  Service: Gastroenterology;;   BREAST CYST ASPIRATION     COLONOSCOPY  03/10/2007   results-normal   COLONOSCOPY WITH PROPOFOL N/A 06/29/2018   Procedure: COLONOSCOPY WITH PROPOFOL;  Surgeon: Lavena Bullion, DO;  Location: [facility];  Service: Gastroenterology;  Laterality: N/A;   DILATION AND CURETTAGE OF UTERUS     HAMMER TOE SURGERY     POLYPECTOMY  06/29/2018   Procedure: POLYPECTOMY;  Surgeon: Lavena Bullion, DO;  Location: [facility];  Service: Gastroenterology;;   PUBOVAGINAL SLING  '90's   for incontinence   [place] Right 2013   [place]   ROTATOR CUFF REPAIR Left 2012   [place]   SHOULDER ARTHROSCOPY WITH ROTATOR CUFF REPAIR AND SUBACROMIAL DECOMPRESSION Left 03/25/2022   Procedure: LEFT SHOULDER ARTHROSCOPY, SUBACROMIAL DECOMPRESSION, DEBRIDEMENT, MINI OPEN ROTATOR CUFF TEAR REPAIR;  Surgeon: Meredith Pel, MD;  Location: [place];  Service: Orthopedics;  Laterality: Left;   TONSILLECTOMY     x2   TUBAL LIGATION     [place]   WRIST SURGERY     Patient Active Problem List   Diagnosis Date Noted   Complete tear of left rotator cuff 03/29/2022   Biceps tendinitis on left 03/29/2022   Degenerative superior labral 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 (SLAP) tear of left shoulder 03/29/2022   Synovitis of left shoulder 03/29/2022   Family history of heart disease 01/07/2022   Allergic rhinitis 09/12/2020   Allergic rhinitis due to animal (cat) (dog) hair and dander 09/12/2020   Mild intermittent asthma 09/12/2020   Vasomotor rhinitis 09/12/2020   Hyperlipidemia, mild 05/17/2020   Impingement syndrome of left shoulder 04/03/2020   Urge incontinence 11/05/2018   Diverticulosis of colon without hemorrhage    Primary osteoarthritis of first carpometacarpal joint of left hand 06/22/2018  Chronic pain of left thumb 06/22/2018   History of retinal tear 10/20/2016   Cough 05/02/2016   Low back pain 09/28/2015   Insomnia 08/18/2013   OSA (obstructive sleep apnea) 07/31/2013   Thyroid nodule, cold 07/15/2012   Anxiety and depression 10/05/2007   Allergic rhinitis due to pollen 10/05/2007   Asthma 10/05/2007    PCP: [name], PA   REFERRING PROVIDER: Meredith Pel, MD  REFERRING DIAG: [phone] (ICD-10-CM) - Tear of left supraspinatus tendon  THERAPY DIAG:  Left shoulder pain, unspecified chronicity  Stiffness of left shoulder, not elsewhere classified  Muscle weakness (generalized)  Rationale for Evaluation and Treatment: Rehabilitation  ONSET DATE: DOS  03/25/2022  SUBJECTIVE:                                                                                                                                                                                      SUBJECTIVE STATEMENT:  I was sleeping last night and woke up last night in a lot of pain, can't remember the position (either on my back or right side), and woke up in a lot of pain. Took Motrin but hasn't calmed down a lot. Wearing the sling when I see grandkids so they don't think I can pick them up.   Prior session   PT received message from PA: As far as progressing with therapy, she is okay to continue going with passive range of motion and active assisted range of motion of the operative shoulder.  She can start some full active range of motion of the shoulder when she is 6 weeks out from procedure on 05/06/2022.  Also okay to start some very gentle rotator cuff strengthening exercises at that point and progress from there.   Pt reports compliance with HEP.  She is still using the ROM chair, but they are coming to pick it up soon.  Pt is limited with all of her self care activities, ADLs, and IADLs.  Pt is limited per protocol and unable to perform any reaching and overhead activities.   PERTINENT HISTORY: L shoulder mini open RCR, biceps tenodesis, and labral debridement on 03/25/22.  PROM x 3 weeks Hx of bilat shoulder arthroscopies Depression and Anxiety which is controlled  PAIN:  Yes- 5/10 Location: back of shoulder Type: deep aching pain Aggravating factors: unsure Relieving factors: unsure   PRECAUTIONS: Other: surgical protocol; PROM currently  WEIGHT BEARING RESTRICTIONS: Yes L UE  FALLS:  Has patient fallen in last 6 months? No   OCCUPATION: Pt is retired  PLOF: Independent.  Pt was able to perform her ADLs and IADLs independently.  Pt performs water aerobics 3x/wk.  Pt  enjoys quilting and embroidering.    PATIENT GOALS: to fasten bra in the back.  To return to  water aerobics and be able to quilt.  Return to her normal activities including driving.     OBJECTIVE:   DIAGNOSTIC FINDINGS:  Pt is post op.      TODAY'S TREATMENT:        05/13/22  TherEx  Pulleys into flexion x20 with 10 second holds AAROM  Serratus punches 0# x15 CW and CCW circles at 90* flexion 2x10 rounds B Flexion x10 AROM supine had to use L UE intermittently to assist due to ongoing L UE weakness   Manual  PROM/stretching to tolerance all directions per protocol   Education  Course of recovery, strength through ROM- IE if you only use muscles in a specific part of ROM it will only be strong within that specific range, progressions per protocol, good vs bad days post-op are normal, typical pain patterns post-op following this surgery, progress so far compared to last few sessions   05/08/22  TherEx  Flexion AAROM supine x15 Scaption AAROM supine x10 manual cues/guidance by PT Flexion AROM x10 limited by pain Seated AAROM ER x15 towel pinch under elbow  Seated ER AROM x15 Scapular retractions x15 3 second holds  Isometrics x10 flexion, ABD  x50%   Manual     PROM surgical shoulder within all limits of protocol    05/02/22  Manual  PROM surgical shoulder within all limits of protocol    TherEx  AAROM x10 with cane flexion within ROM restrictions  AAROM x10 with 50% assist from PT within ROM restrictions  AAROM x10 ER seated with elbow tucked to side about 20* range  Chin tucks x15 B upper trap stretches 3x30 seconds B      PATIENT EDUCATION: Education details:  HEP, POC, objective findings and progress.  Person educated: Patient Education method: Explanation, Demonstration, Tactile cues, Verbal cues Education comprehension: verbalized understanding, returned demonstration, verbal cues required, tactile cues required, and needs further education  HOME EXERCISE PROGRAM: Access Code: [email or web address] URL: [email or web address] Date:  04/16/2022 Prepared by: Ronny Flurry  Exercises - Circular Shoulder Pendulum with Table Support  - 2-3 x daily - 7 x weekly - 2-3 sets - 10 reps - Wrist AROM Flexion Extension  - 3 x daily - 7 x weekly - 2-3 sets - 10 reps - Seated Gripping Towel  - 2-3 x daily - 7 x weekly - 2 sets - 10 reps  Updated HEP: - Seated Shoulder Flexion Towel Slide at Table Top  - 2 x daily - 7 x weekly - 2 sets - 10 reps  ASSESSMENT:  CLINICAL IMPRESSION:  Precious Bard arrives feeling Fair today, had a lot of pain when sleeping for unknown reason. We took it a bit easier today- made it an active recovery day- ROM still looking good. Unsure what's causing her pain as range looked OK. Advised extra ice at home today, hopefully she will be feeling better next session. Tolerated everything well this session.    OBJECTIVE IMPAIRMENTS: decreased activity tolerance, decreased endurance, decreased ROM, decreased strength, hypomobility, impaired flexibility, impaired UE functional use, and pain.   ACTIVITY LIMITATIONS: carrying, lifting, bathing, dressing, reach over head, and hygiene/grooming  PARTICIPATION LIMITATIONS: meal prep, cleaning, laundry, driving, shopping, and community activity  PERSONAL FACTORS: 1 comorbidity: R shoulder arthroscopy  are also affecting patient's functional outcome.   REHAB POTENTIAL: Good  CLINICAL DECISION MAKING: Stable/uncomplicated  EVALUATION COMPLEXITY: Low   GOALS:  SHORT TERM GOALS: Target date: 05/14/2022   Pt will be independent and compliant with HEP for improved pain, ROM, and function.  Baseline: Goal status: INITIAL Target date:  05/21/2022  2.  Pt will demo improved L shoulder PROM to at least 140 deg in flexion and 50 deg in ER for improved shoulder mobility and stiffness.  Baseline:  Goal status: INITIAL Target date:  05/07/2022  3.  Pt will tolerate AAROM as allowed by MD without adverse effects for improved shoulder mobility.  Baseline:  Goal status:  INITIAL Target date:  05/14/2022  4.  Pt will wean out of sling without adverse effects.  Baseline:  Goal status: INITIAL Target date:  05/14/2022  5.  Pt will demo L shoulder AAROM in supine to be at least 120 deg in flexion and 50 deg in ER for improved shoulder mobility and stiffness.  Baseline:  Goal status: INITIAL Target date:  05/21/2022  6.  Pt will be able to actively elevate L shoulder at least 90 deg without significant shoulder hike.  Baseline:  Goal status: INITIAL Target date:  06/11/2022  7.  Pt will report she is able to perform her self care activities with no > than minimal difficulty.  Baseline:  Goal status: INITIAL Target date:  06/18/2022    LONG TERM GOALS: Target date: 08/06/2022   Pt will demo L shoulder AROM to be [facility] t/o for performance of ADLs and IADLs.   Baseline:  Goal status: INITIAL  2.  Pt will be able to perform her ADLs and IADLs without significant difficulty and pain.  Baseline:  Goal status: INITIAL  3.  Pt will be able to perform her normal reaching and overhead activities without significant pain and limitation.  Baseline:  Goal status: INITIAL  4.  Pt will be able to perform quilting without adverse effects.  Baseline:  Goal status: INITIAL  5.  Pt will demo improved L shoulder strength to at least 4+/5 MMT t/o for functional carrying/lifting and performance of household chores.  Baseline:  Goal status: INITIAL    PLAN:  PT FREQUENCY:  2x/wk x 12 weeks and 1x/wk x 4 weeks  PT DURATION: other: 12-16 weeks  PLANNED INTERVENTIONS: Therapeutic exercises, Therapeutic activity, Neuromuscular re-education, Patient/Family education, Self Care, Joint mobilization, Aquatic Therapy, Dry Needling, Electrical stimulation, Spinal mobilization, Cryotherapy, Moist heat, scar mobilization, Taping, Ultrasound, Manual therapy, and Re-evaluation  PLAN FOR NEXT SESSION: Cont per RCR protocol with precautions to biceps tenodesis.  Cont with PROM  and AAROM.   PT received message from PA:  As far as progressing with therapy, she is okay to continue going with passive range of motion and active assisted range of motion of the operative shoulder.  She can start some full active range of motion of the shoulder when she is 6 weeks out from procedure on 05/06/2022.  Also okay to start some very gentle rotator cuff strengthening exercises at that point and progress from there.   Deniece Ree PT DPT PN2

## 2022-05-15 ENCOUNTER — Ambulatory Visit (HOSPITAL_BASED_OUTPATIENT_CLINIC_OR_DEPARTMENT_OTHER): Payer: Medicare Other | Admitting: Physical Therapy

## 2022-05-15 ENCOUNTER — Encounter (HOSPITAL_BASED_OUTPATIENT_CLINIC_OR_DEPARTMENT_OTHER): Payer: Self-pay | Admitting: Physical Therapy

## 2022-05-15 ENCOUNTER — Other Ambulatory Visit (HOSPITAL_BASED_OUTPATIENT_CLINIC_OR_DEPARTMENT_OTHER): Payer: Self-pay

## 2022-05-15 DIAGNOSIS — M25512 Pain in left shoulder: Secondary | ICD-10-CM

## 2022-05-15 DIAGNOSIS — M25612 Stiffness of left shoulder, not elsewhere classified: Secondary | ICD-10-CM

## 2022-05-15 DIAGNOSIS — M6281 Muscle weakness (generalized): Secondary | ICD-10-CM

## 2022-05-15 MED ORDER — COMIRNATY 30 MCG/0.3ML IM SUSY
PREFILLED_SYRINGE | INTRAMUSCULAR | 0 refills | Status: DC
  Filled 2022-05-15: qty 0.3, 1d supply, fill #0

## 2022-05-15 NOTE — Therapy (Signed)
OUTPATIENT PHYSICAL THERAPY SHOULDER TREATMENT    Patient Name: Chelsey Jackson MRN: [id number] DOB:1950-08-10, 72 y.o., female Today's Date: 05/15/2022  END OF SESSION:  PT End of Session - 05/15/22 1019     Visit Number 8    Number of Visits 28    Date for PT Re-Evaluation 07/10/22    Authorization Type MCR A and B    Progress Note Due on Visit 10    PT Start Time 1018    PT Stop Time 1058    PT Time Calculation (min) 40 min    Activity Tolerance Patient tolerated treatment well    Behavior During Therapy WFL for tasks assessed/performed                    Past Medical History:  Diagnosis Date   AC (acromioclavicular) joint bone spurs    left shoulder with chronic pain, w/limitation ROM   Allergy    weekly allergy shots   Anemia    hx of   Anxiety    Arthritis    Asthma    Depression    Fibrocystic breast disease    GERD (gastroesophageal reflux disease)    OSA (obstructive sleep apnea) 07/31/2013   Peptic ulcer disease    TMJ locking    Past Surgical History:  Procedure Laterality Date   ABDOMINAL HYSTERECTOMY  2003   due to fibroids   ADENOIDECTOMY     x3   BICEPT TENODESIS Left 03/25/2022   Procedure: BICEPS TENODESIS;  Surgeon: Meredith Pel, MD;  Location: [place];  Service: Orthopedics;  Laterality: Left;   BIOPSY  06/29/2018   Procedure: BIOPSY;  Surgeon: Lavena Bullion, DO;  Location: [facility];  Service: Gastroenterology;;   BREAST CYST ASPIRATION     COLONOSCOPY  03/10/2007   results-normal   COLONOSCOPY WITH PROPOFOL N/A 06/29/2018   Procedure: COLONOSCOPY WITH PROPOFOL;  Surgeon: Lavena Bullion, DO;  Location: [facility];  Service: Gastroenterology;  Laterality: N/A;   DILATION AND CURETTAGE OF UTERUS     HAMMER TOE SURGERY     POLYPECTOMY  06/29/2018   Procedure: POLYPECTOMY;  Surgeon: Lavena Bullion, DO;  Location[facility];  Service: Gastroenterology;;   PUBOVAGINAL SLING  '90's   for incontinence    [place] Right 2013   [place]   ROTATOR CUFF REPAIR Left 2012   [place]   SHOULDER ARTHROSCOPY WITH ROTATOR CUFF REPAIR AND SUBACROMIAL DECOMPRESSION Left 03/25/2022   Procedure: LEFT SHOULDER ARTHROSCOPY, SUBACROMIAL DECOMPRESSION, DEBRIDEMENT, MINI OPEN ROTATOR CUFF TEAR REPAIR;  Surgeon: Meredith Pel, MD;  Location: [place];  Service: Orthopedics;  Laterality: Left;   TONSILLECTOMY     x2   TUBAL LIGATION     [place]   WRIST SURGERY     Patient Active Problem List   Diagnosis Date Noted   Complete tear of left rotator cuff 03/29/2022   Biceps tendinitis on left 03/29/2022   Degenerative superior labral 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 (SLAP) tear of left shoulder 03/29/2022   Synovitis of left shoulder 03/29/2022   Family history of heart disease 01/07/2022   Allergic rhinitis 09/12/2020   Allergic rhinitis due to animal (cat) (dog) hair and dander 09/12/2020   Mild intermittent asthma 09/12/2020   Vasomotor rhinitis 09/12/2020   Hyperlipidemia, mild 05/17/2020   Impingement syndrome of left shoulder 04/03/2020   Urge incontinence 11/05/2018   Diverticulosis of colon without hemorrhage    Primary osteoarthritis  of first carpometacarpal joint of left hand 06/22/2018   Chronic pain of left thumb 06/22/2018   History of retinal tear 10/20/2016   Cough 05/02/2016   Low back pain 09/28/2015   Insomnia 08/18/2013   OSA (obstructive sleep apnea) 07/31/2013   Thyroid nodule, cold 07/15/2012   Anxiety and depression 10/05/2007   Allergic rhinitis due to pollen 10/05/2007   Asthma 10/05/2007    PCP: [name], PA   REFERRING PROVIDER: Meredith Pel, MD  REFERRING DIAG: [phone] (ICD-10-CM) - Tear of left supraspinatus tendon  THERAPY DIAG:  Left shoulder pain, unspecified chronicity  Stiffness of left shoulder, not elsewhere classified  Muscle weakness (generalized)  Rationale for Evaluation and Treatment:  Rehabilitation  ONSET DATE: DOS 03/25/2022  SUBJECTIVE:                                                                                                                                                                                      SUBJECTIVE STATEMENT:  Feeling better today, the pain is in my arm not in my shoulder itself its a couple inches below. Putting heat and ice on a different spot on my arm. Have to remind my daughter that I cannot pick up my grandkids right now.    Prior session   PT received message from PA: As far as progressing with therapy, she is okay to continue going with passive range of motion and active assisted range of motion of the operative shoulder.  She can start some full active range of motion of the shoulder when she is 6 weeks out from procedure on 05/06/2022.  Also okay to start some very gentle rotator cuff strengthening exercises at that point and progress from there.   Pt reports compliance with HEP.  She is still using the ROM chair, but they are coming to pick it up soon.  Pt is limited with all of her self care activities, ADLs, and IADLs.  Pt is limited per protocol and unable to perform any reaching and overhead activities.   PERTINENT HISTORY: L shoulder mini open RCR, biceps tenodesis, and labral debridement on 03/25/22.  PROM x 3 weeks Hx of bilat shoulder arthroscopies Depression and Anxiety which is controlled  PAIN:  No- 0/10  PRECAUTIONS: Other: surgical protocol; PROM currently  WEIGHT BEARING RESTRICTIONS: Yes L UE  FALLS:  Has patient fallen in last 6 months? No   OCCUPATION: Pt is retired  PLOF: Independent.  Pt was able to perform her ADLs and IADLs independently.  Pt performs water aerobics 3x/wk.  Pt enjoys quilting and embroidering.    PATIENT GOALS: to fasten bra in the back.  To return  to water aerobics and be able to quilt.  Return to her normal activities including driving.     OBJECTIVE:   DIAGNOSTIC FINDINGS:   Pt is post op.      TODAY'S TREATMENT:     05/15/22  Manual  Shoulder stretches/PROM all directions per protocol     TherEx  Supine flexion x15 0#  Supine AAROM scaption x10 50% assist from PT [place] spine 0# x15  Serratus punches 0# x15 supine  Seated biceps curls 2# 2x10 Seated supination/pronation 2# 2x10 Isometric shoulder flexion, ABD, ER, IR x10     05/13/22  TherEx  Pulleys into flexion x20 with 10 second holds AAROM  Serratus punches 0# x15 CW and CCW circles at 90* flexion 2x10 rounds B Flexion x10 AROM supine had to use L UE intermittently to assist due to ongoing L UE weakness   Manual  PROM/stretching to tolerance all directions per protocol   Education  Course of recovery, strength through ROM- IE if you only use muscles in a specific part of ROM it will only be strong within that specific range, progressions per protocol, good vs bad days post-op are normal, typical pain patterns post-op following this surgery, progress so far compared to last few sessions   05/08/22  TherEx  Flexion AAROM supine x15 Scaption AAROM supine x10 manual cues/guidance by PT Flexion AROM x10 limited by pain Seated AAROM ER x15 towel pinch under elbow  Seated ER AROM x15 Scapular retractions x15 3 second holds  Isometrics x10 flexion, ABD  x50%   Manual     PROM surgical shoulder within all limits of protocol    05/02/22  Manual  PROM surgical shoulder within all limits of protocol    TherEx  AAROM x10 with cane flexion within ROM restrictions  AAROM x10 with 50% assist from PT within ROM restrictions  AAROM x10 ER seated with elbow tucked to side about 20* range  Chin tucks x15 B upper trap stretches 3x30 seconds B      PATIENT EDUCATION: Education details:  HEP, POC, objective findings and progress.  Person educated: Patient Education method: Explanation, Demonstration, Tactile cues, Verbal cues Education comprehension: verbalized  understanding, returned demonstration, verbal cues required, tactile cues required, and needs further education  HOME EXERCISE PROGRAM: Access Code: [email or web address] URL: [email or web address] Date: 04/16/2022 Prepared by: Ronny Flurry  Exercises - Circular Shoulder Pendulum with Table Support  - 2-3 x daily - 7 x weekly - 2-3 sets - 10 reps - Wrist AROM Flexion Extension  - 3 x daily - 7 x weekly - 2-3 sets - 10 reps - Seated Gripping Towel  - 2-3 x daily - 7 x weekly - 2 sets - 10 reps  Updated HEP: - Seated Shoulder Flexion Towel Slide at Table Top  - 2 x daily - 7 x weekly - 2 sets - 10 reps  ASSESSMENT:  CLINICAL IMPRESSION:   Precious Bard arrives feeling better, ROM is still a bit limited but still on track per general cuff and tenodesis protocol. Now 7.5 weeks out, did progress AAROM and AROM activities within tolerance this session. Will need updated objective measures prior to MD appointment next week.    OBJECTIVE IMPAIRMENTS: decreased activity tolerance, decreased endurance, decreased ROM, decreased strength, hypomobility, impaired flexibility, impaired UE functional use, and pain.   ACTIVITY LIMITATIONS: carrying, lifting, bathing, dressing, reach over head, and hygiene/grooming  PARTICIPATION LIMITATIONS: meal prep, cleaning, laundry, driving, shopping, and community activity  PERSONAL FACTORS: 1 comorbidity: R shoulder  arthroscopy  are also affecting patient's functional outcome.   REHAB POTENTIAL: Good  CLINICAL DECISION MAKING: Stable/uncomplicated  EVALUATION COMPLEXITY: Low   GOALS:  SHORT TERM GOALS: Target date: 05/14/2022   Pt will be independent and compliant with HEP for improved pain, ROM, and function.  Baseline: Goal status: INITIAL Target date:  05/21/2022  2.  Pt will demo improved L shoulder PROM to at least 140 deg in flexion and 50 deg in ER for improved shoulder mobility and stiffness.  Baseline:  Goal status: INITIAL Target date:   05/07/2022  3.  Pt will tolerate AAROM as allowed by MD without adverse effects for improved shoulder mobility.  Baseline:  Goal status: INITIAL Target date:  05/14/2022  4.  Pt will wean out of sling without adverse effects.  Baseline:  Goal status: INITIAL Target date:  05/14/2022  5.  Pt will demo L shoulder AAROM in supine to be at least 120 deg in flexion and 50 deg in ER for improved shoulder mobility and stiffness.  Baseline:  Goal status: INITIAL Target date:  05/21/2022  6.  Pt will be able to actively elevate L shoulder at least 90 deg without significant shoulder hike.  Baseline:  Goal status: INITIAL Target date:  06/11/2022  7.  Pt will report she is able to perform her self care activities with no > than minimal difficulty.  Baseline:  Goal status: INITIAL Target date:  06/18/2022    LONG TERM GOALS: Target date: 08/06/2022   Pt will demo L shoulder AROM to be [facility] t/o for performance of ADLs and IADLs.   Baseline:  Goal status: INITIAL  2.  Pt will be able to perform her ADLs and IADLs without significant difficulty and pain.  Baseline:  Goal status: INITIAL  3.  Pt will be able to perform her normal reaching and overhead activities without significant pain and limitation.  Baseline:  Goal status: INITIAL  4.  Pt will be able to perform quilting without adverse effects.  Baseline:  Goal status: INITIAL  5.  Pt will demo improved L shoulder strength to at least 4+/5 MMT t/o for functional carrying/lifting and performance of household chores.  Baseline:  Goal status: INITIAL    PLAN:  PT FREQUENCY:  2x/wk x 12 weeks and 1x/wk x 4 weeks  PT DURATION: other: 12-16 weeks  PLANNED INTERVENTIONS: Therapeutic exercises, Therapeutic activity, Neuromuscular re-education, Patient/Family education, Self Care, Joint mobilization, Aquatic Therapy, Dry Needling, Electrical stimulation, Spinal mobilization, Cryotherapy, Moist heat, scar mobilization, Taping,  Ultrasound, Manual therapy, and Re-evaluation  PLAN FOR NEXT SESSION: Needs measures before MD appt. Cont per RCR protocol with precautions to biceps tenodesis.  Cont with PROM and AAROM.   PT received message from PA:  As far as progressing with therapy, she is okay to continue going with passive range of motion and active assisted range of motion of the operative shoulder.  She can start some full active range of motion of the shoulder when she is 6 weeks out from procedure on 05/06/2022.  Also okay to start some very gentle rotator cuff strengthening exercises at that point and progress from there.   Deniece Ree PT DPT PN2

## 2022-05-16 ENCOUNTER — Encounter (HOSPITAL_BASED_OUTPATIENT_CLINIC_OR_DEPARTMENT_OTHER): Payer: Medicare Other | Admitting: Physical Therapy

## 2022-05-19 ENCOUNTER — Encounter (HOSPITAL_BASED_OUTPATIENT_CLINIC_OR_DEPARTMENT_OTHER): Payer: Medicare Other | Admitting: Physical Therapy

## 2022-05-20 NOTE — Therapy (Signed)
OUTPATIENT PHYSICAL THERAPY SHOULDER TREATMENT    Patient Name: Chelsey Jackson MRN: [id number] DOB:09/06/1950, 72 y.o., female Today's Date: 05/21/2022  END OF SESSION:  PT End of Session - 05/21/22 1135     Visit Number 9    Number of Visits 28    Date for PT Re-Evaluation 07/10/22    Authorization Type MCR A and B    PT Start Time 1062    PT Stop Time 1119    PT Time Calculation (min) 50 min    Activity Tolerance Patient tolerated treatment well    Behavior During Therapy WFL for tasks assessed/performed                     Past Medical History:  Diagnosis Date   AC (acromioclavicular) joint bone spurs    left shoulder with chronic pain, w/limitation ROM   Allergy    weekly allergy shots   Anemia    hx of   Anxiety    Arthritis    Asthma    Depression    Fibrocystic breast disease    GERD (gastroesophageal reflux disease)    OSA (obstructive sleep apnea) 07/31/2013   Peptic ulcer disease    TMJ locking    Past Surgical History:  Procedure Laterality Date   ABDOMINAL HYSTERECTOMY  2003   due to fibroids   ADENOIDECTOMY     x3   BICEPT TENODESIS Left 03/25/2022   Procedure: BICEPS TENODESIS;  Surgeon: Meredith Pel, MD;  Location: [place];  Service: Orthopedics;  Laterality: Left;   BIOPSY  06/29/2018   Procedure: BIOPSY;  Surgeon: Lavena Bullion, DO;  Location: [facility];  Service: Gastroenterology;;   BREAST CYST ASPIRATION     COLONOSCOPY  03/10/2007   results-normal   COLONOSCOPY WITH PROPOFOL N/A 06/29/2018   Procedure: COLONOSCOPY WITH PROPOFOL;  Surgeon: Lavena Bullion, DO;  Location: [facility];  Service: Gastroenterology;  Laterality: N/A;   DILATION AND CURETTAGE OF UTERUS     HAMMER TOE SURGERY     POLYPECTOMY  06/29/2018   Procedure: POLYPECTOMY;  Surgeon: Lavena Bullion, DO;  Location: [facility];  Service: Gastroenterology;;   PUBOVAGINAL SLING  '90's   for incontinence   [place] Right 2013   [place]   ROTATOR CUFF REPAIR Left 2012   [place]   SHOULDER ARTHROSCOPY WITH ROTATOR CUFF REPAIR AND SUBACROMIAL DECOMPRESSION Left 03/25/2022   Procedure: LEFT SHOULDER ARTHROSCOPY, SUBACROMIAL DECOMPRESSION, DEBRIDEMENT, MINI OPEN ROTATOR CUFF TEAR REPAIR;  Surgeon: Meredith Pel, MD;  Location: [place];  Service: Orthopedics;  Laterality: Left;   TONSILLECTOMY     x2   TUBAL LIGATION     [place]   WRIST SURGERY     Patient Active Problem List   Diagnosis Date Noted   Complete tear of left rotator cuff 03/29/2022   Biceps tendinitis on left 03/29/2022   Degenerative superior labral 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 (SLAP) tear of left shoulder 03/29/2022   Synovitis of left shoulder 03/29/2022   Family history of heart disease 01/07/2022   Allergic rhinitis 09/12/2020   Allergic rhinitis due to animal (cat) (dog) hair and dander 09/12/2020   Mild intermittent asthma 09/12/2020   Vasomotor rhinitis 09/12/2020   Hyperlipidemia, mild 05/17/2020   Impingement syndrome of left shoulder 04/03/2020   Urge incontinence 11/05/2018   Diverticulosis of colon without hemorrhage    Primary osteoarthritis of first carpometacarpal joint of left hand 06/22/2018  Chronic pain of left thumb 06/22/2018   History of retinal tear 10/20/2016   Cough 05/02/2016   Low back pain 09/28/2015   Insomnia 08/18/2013   OSA (obstructive sleep apnea) 07/31/2013   Thyroid nodule, cold 07/15/2012   Anxiety and depression 10/05/2007   Allergic rhinitis due to pollen 10/05/2007   Asthma 10/05/2007    PCP: [name], PA   REFERRING PROVIDER: Meredith Pel, MD  REFERRING DIAG: [id number] (ICD-10-CM) - Tear of left supraspinatus tendon  THERAPY DIAG:  Left shoulder pain, unspecified chronicity  Stiffness of left shoulder, not elsewhere classified  Muscle weakness (generalized)  Rationale for Evaluation and Treatment: Rehabilitation  ONSET DATE: DOS  03/25/2022  SUBJECTIVE:                                                                                                                                                                                      SUBJECTIVE STATEMENT:  Pt is 8 weeks and 1 day s/p L shoulder RCR, biceps tenodesis, and labral debridement.  Pt reports having having stiffness in L shoulder.  Her  stiffness is worst 1st thing in AM.  Pt sees MD later today. Pt has not taken flexeril in 10 days and is able to sleep well.  Pt reports slight difficulty with with dressing and no difficulty with bathing.  Pt reports compliance with HEP.       PERTINENT HISTORY: L shoulder mini open RCR, biceps tenodesis, and labral debridement on 03/25/22.  PROM x 3 weeks Hx of bilat shoulder arthroscopies Depression and Anxiety which is controlled  PAIN:  0/10 current, 5/10 worst  PRECAUTIONS: Other: surgical protocol; PROM currently  WEIGHT BEARING RESTRICTIONS: Yes L UE  FALLS:  Has patient fallen in last 6 months? No   OCCUPATION: Pt is retired  PLOF: Independent.  Pt was able to perform her ADLs and IADLs independently.  Pt performs water aerobics 3x/wk.  Pt enjoys quilting and embroidering.    PATIENT GOALS: to fasten bra in the back.  To return to water aerobics and be able to quilt.  Return to her normal activities including driving.     OBJECTIVE:   DIAGNOSTIC FINDINGS:  Pt is post op.      TODAY'S TREATMENT:     Therapeutic Exercise: -Reviewed current function, HEP compliance, pain level, and response to prior Rx.   -Pt received L shoulder PROM in flexion, abd, ER, and IR per pt and tissue tolerance w/n protocol ranges.  -L shoulder ROM:  PROM:  Flexion:  148 deg, Abd:  90 deg, ER:  53 deg, IR:  55 deg  AROM:  (Supine)  Flex:  142 deg,  ER:  57 deg  -Pt performed:  Seated pulleys in flexion and scaption x 20 reps each Supine serratus punch 2x15 Supine shoulder ABC x 1 rep Supine wand ER x 15  reps Prone extension to neutral 2x10 reps   Reviewed Shoulder submaximal isometrics.  Pt required instruction in correct form.  Pt performed submaximal isometrics in flexion x 5-6 reps, abd x 5-6 reps, ER approx 3 reps, and IR approx 8 reps.  Pt received a HEP handout and was educated in correct form and appropriate frequency.  Pt was instructed to push gently with isometrics and she should not have pain with isometrics.   PATIENT EDUCATION: Education details:  HEP, POC, objective findings and progress.  Person educated: Patient Education method: Explanation, Demonstration, Tactile cues, Verbal cues Education comprehension: verbalized understanding, returned demonstration, verbal cues required, tactile cues required, and needs further education  HOME EXERCISE PROGRAM: Access Code: [email or web address] URL: [email or web address] Prepared by: Ronny Flurry  Updated HEP: - Isometric Shoulder Flexion at Wall  - 1-2 x daily - 5 x weekly - 1 sets - 10 reps - 5 seconds hold - Isometric Shoulder Abduction at Wall  - 1-2 x daily - 5 x weekly - 1 sets - 10 reps - 5 seconds hold - Standing Isometric Shoulder Internal Rotation at Doorway  - 1-2 x daily - 5 x weekly - 1 sets - 10 reps - 5 seconds hold  ASSESSMENT:  CLINICAL IMPRESSION:  Pt is improving with function as evidenced by subjective reports.  Pt is sleeping well now and has not been taking her mm relaxer.  Pt is progressing with AROM and PROM as evidenced by subjective reports.  Pt reports compliance with HEP.  She hasn't been doing the isometrics as much at home though due to having pain with isometrics.  PT evaluated form with submaximal isometrics and pt had incorrect form.  PT demonstrates and instructed pt in correct form.  Pt felt more comfortable not having pain though was fatigued when performing iso's in the clinic.  Pt had difficulty with ER isometric and was quickly fatigued.  PT reviewed HEP and instructed pt in home exercises.   Gave pt a handout of submax iso's and thoroughly went thru form, reps, sets, and frequency.  Pt demonstrated good understanding of updated HEP.  Pt responded well to Rx though was fatigued in L UE.  Pt stated she would ice at home.  Pt should benefit from cont skilled PT services to address ongoing goals and impairments and to improve overall function.    OBJECTIVE IMPAIRMENTS: decreased activity tolerance, decreased endurance, decreased ROM, decreased strength, hypomobility, impaired flexibility, impaired UE functional use, and pain.   ACTIVITY LIMITATIONS: carrying, lifting, bathing, dressing, reach over head, and hygiene/grooming  PARTICIPATION LIMITATIONS: meal prep, cleaning, laundry, driving, shopping, and community activity  PERSONAL FACTORS: 1 comorbidity: R shoulder arthroscopy  are also affecting patient's functional outcome.   REHAB POTENTIAL: Good  CLINICAL DECISION MAKING: Stable/uncomplicated  EVALUATION COMPLEXITY: Low   GOALS:  SHORT TERM GOALS: Target date: 05/14/2022   Pt will be independent and compliant with HEP for improved pain, ROM, and function.  Baseline: Goal status: INITIAL Target date:  05/21/2022  2.  Pt will demo improved L shoulder PROM to at least 140 deg in flexion and 50 deg in ER for improved shoulder mobility and stiffness.  Baseline:  Goal status: INITIAL Target date:  05/07/2022  3.  Pt will tolerate AAROM as allowed by MD without adverse effects for  improved shoulder mobility.  Baseline:  Goal status: INITIAL Target date:  05/14/2022  4.  Pt will wean out of sling without adverse effects.  Baseline:  Goal status: INITIAL Target date:  05/14/2022  5.  Pt will demo L shoulder AAROM in supine to be at least 120 deg in flexion and 50 deg in ER for improved shoulder mobility and stiffness.  Baseline:  Goal status: INITIAL Target date:  05/21/2022  6.  Pt will be able to actively elevate L shoulder at least 90 deg without significant shoulder  hike.  Baseline:  Goal status: INITIAL Target date:  06/11/2022  7.  Pt will report she is able to perform her self care activities with no > than minimal difficulty.  Baseline:  Goal status: INITIAL Target date:  06/18/2022    LONG TERM GOALS: Target date: 08/06/2022   Pt will demo L shoulder AROM to be [facility] t/o for performance of ADLs and IADLs.   Baseline:  Goal status: INITIAL  2.  Pt will be able to perform her ADLs and IADLs without significant difficulty and pain.  Baseline:  Goal status: INITIAL  3.  Pt will be able to perform her normal reaching and overhead activities without significant pain and limitation.  Baseline:  Goal status: INITIAL  4.  Pt will be able to perform quilting without adverse effects.  Baseline:  Goal status: INITIAL  5.  Pt will demo improved L shoulder strength to at least 4+/5 MMT t/o for functional carrying/lifting and performance of household chores.  Baseline:  Goal status: INITIAL    PLAN:  PT FREQUENCY:  2x/wk x 12 weeks and 1x/wk x 4 weeks  PT DURATION: other: 12-16 weeks  PLANNED INTERVENTIONS: Therapeutic exercises, Therapeutic activity, Neuromuscular re-education, Patient/Family education, Self Care, Joint mobilization, Aquatic Therapy, Dry Needling, Electrical stimulation, Spinal mobilization, Cryotherapy, Moist heat, scar mobilization, Taping, Ultrasound, Manual therapy, and Re-evaluation  PLAN FOR NEXT SESSION:  Cont per RCR protocol with precautions to biceps tenodesis.  Cont with PROM/AAROM/AROM.  Review HEP.   PT received message from PA:  As far as progressing with therapy, she is okay to continue going with passive range of motion and active assisted range of motion of the operative shoulder.  She can start some full active range of motion of the shoulder when she is 6 weeks out from procedure on 05/06/2022.  Also okay to start some very gentle rotator cuff strengthening exercises at that point and progress from there.    Selinda Michaels III PT, DPT 05/21/22 9:19 PM

## 2022-05-21 ENCOUNTER — Encounter (HOSPITAL_BASED_OUTPATIENT_CLINIC_OR_DEPARTMENT_OTHER): Payer: Self-pay | Admitting: Physical Therapy

## 2022-05-21 ENCOUNTER — Ambulatory Visit (HOSPITAL_BASED_OUTPATIENT_CLINIC_OR_DEPARTMENT_OTHER): Payer: Medicare Other | Admitting: Physical Therapy

## 2022-05-21 ENCOUNTER — Ambulatory Visit (INDEPENDENT_AMBULATORY_CARE_PROVIDER_SITE_OTHER): Payer: Medicare Other | Admitting: Orthopedic Surgery

## 2022-05-21 ENCOUNTER — Encounter: Payer: Self-pay | Admitting: Orthopedic Surgery

## 2022-05-21 DIAGNOSIS — M6281 Muscle weakness (generalized): Secondary | ICD-10-CM

## 2022-05-21 DIAGNOSIS — M25612 Stiffness of left shoulder, not elsewhere classified: Secondary | ICD-10-CM

## 2022-05-21 DIAGNOSIS — M75102 Unspecified rotator cuff tear or rupture of left shoulder, not specified as traumatic: Secondary | ICD-10-CM

## 2022-05-21 DIAGNOSIS — M25512 Pain in left shoulder: Secondary | ICD-10-CM

## 2022-05-21 NOTE — Progress Notes (Signed)
Post-Op Visit Note   Patient: Chelsey Jackson           Date of Birth: Dec 31, 1950           MRN: [id number] Visit Date: 05/21/2022 PCP: [name], PA   Assessment & Plan:  Chief Complaint:  Chief Complaint  Patient presents with   Post-op Follow-up    03/25/22 (8w 1d) Shoulder Arthroscopy, Subacromial Decompression, Debridement, Mini Open Rotator Cuff Tear Repair - Left  Biceps Tenodesis - Left      Visit Diagnoses:  1. Tear of left supraspinatus tendon     Plan: Chelsey Jackson is a 72 year old female who is now 8 weeks out left shoulder arthroscopy with subacromial decompression and mini open rotator cuff tear repair.  Has been going to therapy 2 times a week.  This morning she had range of motion on the left passive of 148 degrees of flexion 90 of abduction and 53 degrees of external rotation.  No coarse grinding or crepitus present.  5- out of 5 external rotation strength.  At this time I think she is okay to do water aerobics.  No overhead resistance training as part of that however.  She is good to hold off on using a trainer for shoulder exercises for 4 more weeks.  Note for massage therapy provided.  4-week return for final check.  Follow-Up Instructions: No follow-ups on file.   Orders:  No orders of the defined types were placed in this encounter.  No orders of the defined types were placed in this encounter.   Imaging: No results found.  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 (SLAP) tear of left shoulder 03/29/2022   Synovitis of left shoulder 03/29/2022   Family history of heart disease 01/07/2022   Allergic rhinitis 09/12/2020   Allergic rhinitis due to animal (cat) (dog) hair and dander 09/12/2020   Mild intermittent asthma 09/12/2020   Vasomotor rhinitis 09/12/2020   Hyperlipidemia, mild  05/17/2020   Impingement syndrome of left shoulder 04/03/2020   Urge incontinence 11/05/2018   Diverticulosis of colon without hemorrhage    Primary osteoarthritis of first carpometacarpal joint of left hand 06/22/2018   Chronic pain of left thumb 06/22/2018   History of retinal tear 10/20/2016   Cough 05/02/2016   Low back pain 09/28/2015   Insomnia 08/18/2013   OSA (obstructive sleep apnea) 07/31/2013   Thyroid nodule, cold 07/15/2012   Anxiety and depression 10/05/2007   Allergic rhinitis due to pollen 10/05/2007   Asthma 10/05/2007   Past Medical History:  Diagnosis Date   AC (acromioclavicular) joint bone spurs    left shoulder with chronic pain, w/limitation ROM   Allergy    weekly allergy shots   Anemia    hx of   Anxiety    Arthritis    Asthma    Depression    Fibrocystic breast disease    GERD (gastroesophageal reflux disease)    OSA (obstructive sleep apnea) 07/31/2013   Peptic ulcer disease    TMJ locking     Family History  Problem Relation Age of Onset   Osteoporosis Mother    Fibroids Mother    Other Mother        aortic valve replacement 1994, abdominal aortic aneurysm   Lung cancer Mother 45   Heart disease Father    Testicular cancer  Father 42   Prostate cancer Father 61   Leukemia Father        CLL   Diabetes Maternal Grandmother    Heart disease Maternal Grandfather    Heart disease Paternal Grandmother    Diabetes Paternal Grandfather    Stomach cancer Paternal Grandfather    Breast cancer Paternal Aunt    Breast cancer Cousin    Colon cancer Neg Hx    Throat cancer Neg Hx    Pancreatic cancer Neg Hx     Past Surgical History:  Procedure Laterality Date   ABDOMINAL HYSTERECTOMY  2003   due to fibroids   ADENOIDECTOMY     x3   BICEPT TENODESIS Left 03/25/2022   Procedure: BICEPS TENODESIS;  Surgeon: Meredith Pel, MD;  Location: [place];  Service: Orthopedics;  Laterality: Left;   BIOPSY  06/29/2018   Procedure: BIOPSY;  Surgeon:  Lavena Bullion, DO;  Location: [facility];  Service: Gastroenterology;;   BREAST CYST ASPIRATION     COLONOSCOPY  03/10/2007   results-normal   COLONOSCOPY WITH PROPOFOL N/A 06/29/2018   Procedure: COLONOSCOPY WITH PROPOFOL;  Surgeon: Lavena Bullion, DO;  Location: [facility];  Service: Gastroenterology;  Laterality: N/A;   DILATION AND CURETTAGE OF UTERUS     HAMMER TOE SURGERY     POLYPECTOMY  06/29/2018   Procedure: POLYPECTOMY;  Surgeon: Lavena Bullion, DO;  Location: [facility];  Service: Gastroenterology;;   PUBOVAGINAL SLING  '90's   for incontinence   [place] Right 2013   [place]   ROTATOR CUFF REPAIR Left 2012   [place]   SHOULDER ARTHROSCOPY WITH ROTATOR CUFF REPAIR AND SUBACROMIAL DECOMPRESSION Left 03/25/2022   Procedure: LEFT SHOULDER ARTHROSCOPY, SUBACROMIAL DECOMPRESSION, DEBRIDEMENT, MINI OPEN ROTATOR CUFF TEAR REPAIR;  Surgeon: Meredith Pel, MD;  Location: [place];  Service: Orthopedics;  Laterality: Left;   TONSILLECTOMY     x2   TUBAL LIGATION     TURBINATE REDUCTION  1997   WRIST SURGERY     Social History   Occupational History   Not on file  Tobacco Use   Smoking status: Former    Packs/day: 2.00    Years: 15.00    Total pack years: 30.00    Types: Cigarettes    Quit date: 11/19/1989    Years since quitting: 32.5   Smokeless tobacco: Never  Vaping Use   Vaping Use: Never used  Substance and Sexual Activity   Alcohol use: Yes    Comment: maybe a glass of wine on the weekend   Drug use: No   Sexual activity: Not on file

## 2022-05-27 ENCOUNTER — Encounter (HOSPITAL_BASED_OUTPATIENT_CLINIC_OR_DEPARTMENT_OTHER): Payer: Self-pay

## 2022-05-27 ENCOUNTER — Ambulatory Visit (HOSPITAL_BASED_OUTPATIENT_CLINIC_OR_DEPARTMENT_OTHER): Payer: Medicare Other | Attending: Orthopedic Surgery

## 2022-05-27 DIAGNOSIS — M6281 Muscle weakness (generalized): Secondary | ICD-10-CM | POA: Diagnosis present

## 2022-05-27 DIAGNOSIS — M25512 Pain in left shoulder: Secondary | ICD-10-CM | POA: Diagnosis present

## 2022-05-27 DIAGNOSIS — M25612 Stiffness of left shoulder, not elsewhere classified: Secondary | ICD-10-CM | POA: Diagnosis present

## 2022-05-27 NOTE — Therapy (Signed)
OUTPATIENT PHYSICAL THERAPY SHOULDER TREATMENT   Progress Note Reporting Period 04/16/2022 to 05/27/2022  See note below for Objective Data and Assessment of Progress/Goals.       Patient Name: Chelsey Jackson MRN: [id number] DOB:11-08-1950, 72 y.o., female Today's Date: 05/27/2022  END OF SESSION:  PT End of Session - 05/27/22 0855     Visit Number 10    Number of Visits 28    Date for PT Re-Evaluation 07/10/22    Authorization Type MCR A and B    Progress Note Due on Visit 10    PT Start Time 0847    PT Stop Time 0930    PT Time Calculation (min) 43 min    Activity Tolerance Patient tolerated treatment well    Behavior During Therapy [facility] for tasks assessed/performed                      Past Medical History:  Diagnosis Date   AC (acromioclavicular) joint bone spurs    left shoulder with chronic pain, w/limitation ROM   Allergy    weekly allergy shots   Anemia    hx of   Anxiety    Arthritis    Asthma    Depression    Fibrocystic breast disease    GERD (gastroesophageal reflux disease)    OSA (obstructive sleep apnea) 07/31/2013   Peptic ulcer disease    TMJ locking    Past Surgical History:  Procedure Laterality Date   ABDOMINAL HYSTERECTOMY  2003   due to fibroids   ADENOIDECTOMY     x3   BICEPT TENODESIS Left 03/25/2022   Procedure: BICEPS TENODESIS;  Surgeon: Meredith Pel, MD;  Location: [place];  Service: Orthopedics;  Laterality: Left;   BIOPSY  06/29/2018   Procedure: BIOPSY;  Surgeon: Lavena Bullion, DO;  Location: [facility];  Service: Gastroenterology;;   BREAST CYST ASPIRATION     COLONOSCOPY  03/10/2007   results-normal   COLONOSCOPY WITH PROPOFOL N/A 06/29/2018   Procedure: COLONOSCOPY WITH PROPOFOL;  Surgeon: Lavena Bullion, DO;  Location: [facility];  Service: Gastroenterology;  Laterality: N/A;   DILATION AND CURETTAGE OF UTERUS     HAMMER TOE SURGERY     POLYPECTOMY  06/29/2018   Procedure: POLYPECTOMY;   Surgeon: Lavena Bullion, DO;  Location: [facility];  Service: Gastroenterology;;   PUBOVAGINAL SLING  '90's   for incontinence   [place] Right 2013   [place]   ROTATOR CUFF REPAIR Left 2012   [place]   SHOULDER ARTHROSCOPY WITH ROTATOR CUFF REPAIR AND SUBACROMIAL DECOMPRESSION Left 03/25/2022   Procedure: LEFT SHOULDER ARTHROSCOPY, SUBACROMIAL DECOMPRESSION, DEBRIDEMENT, MINI OPEN ROTATOR CUFF TEAR REPAIR;  Surgeon: Meredith Pel, MD;  Location: [place];  Service: Orthopedics;  Laterality: Left;   TONSILLECTOMY     x2   TUBAL LIGATION     [place]   WRIST SURGERY     Patient Active Problem List   Diagnosis Date Noted   Complete tear of left rotator cuff 03/29/2022   Biceps tendinitis on left 03/29/2022   Degenerative superior labral 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 (SLAP) tear of left shoulder 03/29/2022   Synovitis of left shoulder 03/29/2022   Family history of heart disease 01/07/2022   Allergic rhinitis 09/12/2020   Allergic rhinitis due to animal (cat) (dog) hair and dander 09/12/2020   Mild intermittent asthma 09/12/2020   Vasomotor rhinitis 09/12/2020   Hyperlipidemia, mild 05/17/2020  Impingement syndrome of left shoulder 04/03/2020   Urge incontinence 11/05/2018   Diverticulosis of colon without hemorrhage    Primary osteoarthritis of first carpometacarpal joint of left hand 06/22/2018   Chronic pain of left thumb 06/22/2018   History of retinal tear 10/20/2016   Cough 05/02/2016   Low back pain 09/28/2015   Insomnia 08/18/2013   OSA (obstructive sleep apnea) 07/31/2013   Thyroid nodule, cold 07/15/2012   Anxiety and depression 10/05/2007   Allergic rhinitis due to pollen 10/05/2007   Asthma 10/05/2007    PCP: [name], PA   REFERRING PROVIDER: Meredith Pel, MD  REFERRING DIAG: [phone] (ICD-10-CM) - Tear of left supraspinatus tendon  THERAPY DIAG:  Left shoulder pain, unspecified  chronicity  Stiffness of left shoulder, not elsewhere classified  Muscle weakness (generalized)  Rationale for Evaluation and Treatment: Rehabilitation  ONSET DATE: DOS 03/25/2022  SUBJECTIVE:                                                                                                                                                                                      SUBJECTIVE STATEMENT:  Pt reports she tried water aerobics yesterday without increased resistance in class. "I thought I was going to die. My shoulder was exhausted after." Has relief with ice. MD visit went well.    PERTINENT HISTORY: L shoulder mini open RCR, biceps tenodesis, and labral debridement on 03/25/22.  PROM x 3 weeks Hx of bilat shoulder arthroscopies Depression and Anxiety which is controlled  PAIN:  0/10 current, 5/10 worst  PRECAUTIONS: Other: surgical protocol; PROM currently  WEIGHT BEARING RESTRICTIONS: Yes L UE  FALLS:  Has patient fallen in last 6 months? No   OCCUPATION: Pt is retired  PLOF: Independent.  Pt was able to perform her ADLs and IADLs independently.  Pt performs water aerobics 3x/wk.  Pt enjoys quilting and embroidering.    PATIENT GOALS: to fasten bra in the back.  To return to water aerobics and be able to quilt.  Return to her normal activities including driving.     OBJECTIVE: 05/27/22   -L shoulder ROM:  PROM:  Flexion:  140 deg, Abd:  123 deg, ER:  53 deg, IR:  59 deg  AROM:  (Supine)  Flex:  137deg     TODAY'S TREATMENT:     Progress note Review of Goals, FOTO, Updated ROM  -L shoulder ROM:  PROM:  Flexion:  140 deg, Abd:  123 deg, ER:  53 deg, IR:  59 deg  AROM:  (Supine)  Flex:  137deg  Therex: PROM L shoulder Supine active flexion 2x10 Shoulder isometrics 5" x10ea- flex/abd/IR   PATIENT  EDUCATION: Education details:  HEP, POC, objective findings and progress.  Person educated: Patient Education method: Explanation, Demonstration, Tactile cues,  Verbal cues Education comprehension: verbalized understanding, returned demonstration, verbal cues required, tactile cues required, and needs further education  HOME EXERCISE PROGRAM: Access Code: [email or web address] URL: [email or web address] Prepared by: Ronny Flurry  Updated HEP: - Isometric Shoulder Flexion at Wall  - 1-2 x daily - 5 x weekly - 1 sets - 10 reps - 5 seconds hold - Isometric Shoulder Abduction at Wall  - 1-2 x daily - 5 x weekly - 1 sets - 10 reps - 5 seconds hold - Standing Isometric Shoulder Internal Rotation at Doorway  - 1-2 x daily - 5 x weekly - 1 sets - 10 reps - 5 seconds hold  ASSESSMENT:  CLINICAL IMPRESSION:  Pt has attended 10 visits of PT thus far and is making steady progress towards LTGs. Has met 4/7 STGs. Updated FOTO score of 53, an improvement from 16 at IE. Not limited by pain or soreness in daily life. Obeying lifting restrictions. She remains limited into end range flexion and abduction especially, though overall improvement noted. Able to complete AROM flexion in supine without pain, but does note crepitus. Mild increase in tightness today compared to last session, which pt attributes to early morning appt. Reviewed isometric exercise again with pt for correct performance.   OBJECTIVE IMPAIRMENTS: decreased activity tolerance, decreased endurance, decreased ROM, decreased strength, hypomobility, impaired flexibility, impaired UE functional use, and pain.   ACTIVITY LIMITATIONS: carrying, lifting, bathing, dressing, reach over head, and hygiene/grooming  PARTICIPATION LIMITATIONS: meal prep, cleaning, laundry, driving, shopping, and community activity  PERSONAL FACTORS: 1 comorbidity: R shoulder arthroscopy  are also affecting patient's functional outcome.   REHAB POTENTIAL: Good  CLINICAL DECISION MAKING: Stable/uncomplicated  EVALUATION COMPLEXITY: Low   GOALS:  SHORT TERM GOALS: Target date: 05/14/2022   Pt will be independent and  compliant with HEP for improved pain, ROM, and function.  Baseline: Goal status: MET 05/27/22 Target date:  05/21/2022  2.  Pt will demo improved L shoulder PROM to at least 140 deg in flexion and 50 deg in ER for improved shoulder mobility and stiffness.  Baseline:  Goal status: MET 05/27/22 Target date:  05/07/2022  3.  Pt will tolerate AAROM as allowed by MD without adverse effects for improved shoulder mobility.  Baseline:  Goal status: MET 05/27/22 Target date:  05/14/2022  4.  Pt will wean out of sling without adverse effects.  Baseline:  Goal status: MET 05/27/22 Target date:  05/14/2022  5.  Pt will demo L shoulder AAROM in supine to be at least 120 deg in flexion and 50 deg in ER for improved shoulder mobility and stiffness.  Baseline:  Goal status: IN PROGRESS Target date:  05/21/2022  6.  Pt will be able to actively elevate L shoulder at least 90 deg without significant shoulder hike.  Baseline:  Goal status: IN PROGRESS Target date:  06/11/2022  7.  Pt will report she is able to perform her self care activities with no > than minimal difficulty.  Baseline:  Goal status: IN PROGRESS Target date:  06/18/2022    LONG TERM GOALS: Target date: 08/06/2022   Pt will demo L shoulder AROM to be [facility] t/o for performance of ADLs and IADLs.   Baseline:  Goal status: IN PROGRESS  2.  Pt will be able to perform her ADLs and IADLs without significant difficulty and pain.  Baseline:  Goal status: IN  PROGRESS  3.  Pt will be able to perform her normal reaching and overhead activities without significant pain and limitation.  Baseline:  Goal status: IN PROGRESS  4.  Pt will be able to perform quilting without adverse effects.  Baseline:  Goal status: INITIAL  5.  Pt will demo improved L shoulder strength to at least 4+/5 MMT t/o for functional carrying/lifting and performance of household chores.  Baseline:  Goal status: INITIAL    PLAN:  PT FREQUENCY:  2x/wk   PT DURATION:  other: 6-10 weeks  PLANNED INTERVENTIONS: Therapeutic exercises, Therapeutic activity, Neuromuscular re-education, Patient/Family education, Self Care, Joint mobilization, Aquatic Therapy, Dry Needling, Electrical stimulation, Spinal mobilization, Cryotherapy, Moist heat, scar mobilization, Taping, Ultrasound, Manual therapy, and Re-evaluation  PLAN FOR NEXT SESSION:  PROM, AAROM, AROM, strengthening per protocol Sherlynn Carbon, PTA  05/27/22 4:55 PM  PT spoke with PTA and reviewed pt's progress and POC.   Selinda Michaels III PT, DPT 05/28/22 10:57 AM

## 2022-05-29 ENCOUNTER — Encounter (HOSPITAL_BASED_OUTPATIENT_CLINIC_OR_DEPARTMENT_OTHER): Payer: Self-pay

## 2022-05-29 ENCOUNTER — Ambulatory Visit (HOSPITAL_BASED_OUTPATIENT_CLINIC_OR_DEPARTMENT_OTHER): Payer: Medicare Other

## 2022-05-29 DIAGNOSIS — M6281 Muscle weakness (generalized): Secondary | ICD-10-CM

## 2022-05-29 DIAGNOSIS — M25512 Pain in left shoulder: Secondary | ICD-10-CM | POA: Diagnosis not present

## 2022-05-29 DIAGNOSIS — M25612 Stiffness of left shoulder, not elsewhere classified: Secondary | ICD-10-CM

## 2022-05-29 NOTE — Therapy (Signed)
OUTPATIENT PHYSICAL THERAPY SHOULDER TREATMENT   Progress Note Reporting Period 04/16/2022 to 05/27/2022  See note below for Objective Data and Assessment of Progress/Goals.       Patient Name: Chelsey Jackson MRN: [id number] DOB:Dec 19, 1950, 72 y.o., female Today's Date: 05/29/2022  END OF SESSION:  PT End of Session - 05/29/22 1622     Visit Number 11    Number of Visits 28    Date for PT Re-Evaluation 07/10/22    Authorization Type MCR A and B    Progress Note Due on Visit 10    PT Start Time 1515    PT Stop Time 1556    PT Time Calculation (min) 41 min    Activity Tolerance Patient tolerated treatment well    Behavior During Therapy WFL for tasks assessed/performed                       Past Medical History:  Diagnosis Date   AC (acromioclavicular) joint bone spurs    left shoulder with chronic pain, w/limitation ROM   Allergy    weekly allergy shots   Anemia    hx of   Anxiety    Arthritis    Asthma    Depression    Fibrocystic breast disease    GERD (gastroesophageal reflux disease)    OSA (obstructive sleep apnea) 07/31/2013   Peptic ulcer disease    TMJ locking    Past Surgical History:  Procedure Laterality Date   ABDOMINAL HYSTERECTOMY  2003   due to fibroids   ADENOIDECTOMY     x3   BICEPT TENODESIS Left 03/25/2022   Procedure: BICEPS TENODESIS;  Surgeon: Meredith Pel, MD;  Location: [place];  Service: Orthopedics;  Laterality: Left;   BIOPSY  06/29/2018   Procedure: BIOPSY;  Surgeon: Lavena Bullion, DO;  Location: [facility];  Service: Gastroenterology;;   BREAST CYST ASPIRATION     COLONOSCOPY  03/10/2007   results-normal   COLONOSCOPY WITH PROPOFOL N/A 06/29/2018   Procedure: COLONOSCOPY WITH PROPOFOL;  Surgeon: Lavena Bullion, DO;  Location: [facility];  Service: Gastroenterology;  Laterality: N/A;   DILATION AND CURETTAGE OF UTERUS     HAMMER TOE SURGERY     POLYPECTOMY  06/29/2018   Procedure: POLYPECTOMY;   Surgeon: Lavena Bullion, DO;  Location: [facility];  Service: Gastroenterology;;   PUBOVAGINAL SLING  '90's   for incontinence   [place] Right 2013   [place]   ROTATOR CUFF REPAIR Left 2012   [place]   SHOULDER ARTHROSCOPY WITH ROTATOR CUFF REPAIR AND SUBACROMIAL DECOMPRESSION Left 03/25/2022   Procedure: LEFT SHOULDER ARTHROSCOPY, SUBACROMIAL DECOMPRESSION, DEBRIDEMENT, MINI OPEN ROTATOR CUFF TEAR REPAIR;  Surgeon: Meredith Pel, MD;  Location: [place];  Service: Orthopedics;  Laterality: Left;   TONSILLECTOMY     x2   TUBAL LIGATION     [place]   WRIST SURGERY     Patient Active Problem List   Diagnosis Date Noted   Complete tear of left rotator cuff 03/29/2022   Biceps tendinitis on left 03/29/2022   Degenerative superior labral 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 (SLAP) tear of left shoulder 03/29/2022   Synovitis of left shoulder 03/29/2022   Family history of heart disease 01/07/2022   Allergic rhinitis 09/12/2020   Allergic rhinitis due to animal (cat) (dog) hair and dander 09/12/2020   Mild intermittent asthma 09/12/2020   Vasomotor rhinitis 09/12/2020   Hyperlipidemia, mild  05/17/2020   Impingement syndrome of left shoulder 04/03/2020   Urge incontinence 11/05/2018   Diverticulosis of colon without hemorrhage    Primary osteoarthritis of first carpometacarpal joint of left hand 06/22/2018   Chronic pain of left thumb 06/22/2018   History of retinal tear 10/20/2016   Cough 05/02/2016   Low back pain 09/28/2015   Insomnia 08/18/2013   OSA (obstructive sleep apnea) 07/31/2013   Thyroid nodule, cold 07/15/2012   Anxiety and depression 10/05/2007   Allergic rhinitis due to pollen 10/05/2007   Asthma 10/05/2007    PCP: [name], PA   REFERRING PROVIDER: Meredith Pel, MD  REFERRING DIAG: [phone] (ICD-10-CM) - Tear of left supraspinatus tendon  THERAPY DIAG:  Left shoulder pain, unspecified  chronicity  Stiffness of left shoulder, not elsewhere classified  Muscle weakness (generalized)  Rationale for Evaluation and Treatment: Rehabilitation  ONSET DATE: DOS 03/25/2022 Days since surgery: 65   SUBJECTIVE:                                                                                                                                                                                      SUBJECTIVE STATEMENT:  Pt reports no changes in pain level at entry. "Mornings are the worst."   PERTINENT HISTORY: L shoulder mini open RCR, biceps tenodesis, and labral debridement on 03/25/22.  PROM x 3 weeks Hx of bilat shoulder arthroscopies Depression and Anxiety which is controlled  PAIN:  0/10 current, 5/10 worst  PRECAUTIONS: Other: surgical protocol; PROM currently  WEIGHT BEARING RESTRICTIONS: Yes L UE  FALLS:  Has patient fallen in last 6 months? No   OCCUPATION: Pt is retired  PLOF: Independent.  Pt was able to perform her ADLs and IADLs independently.  Pt performs water aerobics 3x/wk.  Pt enjoys quilting and embroidering.    PATIENT GOALS: to fasten bra in the back.  To return to water aerobics and be able to quilt.  Return to her normal activities including driving.     OBJECTIVE: 05/27/22   -L shoulder ROM:  PROM:  Flexion:  140 deg, Abd:  123 deg, ER:  53 deg, IR:  59 deg  AROM:  (Supine)  Flex:  137deg     TODAY'S TREATMENT:     Therex: PROM L shoulder Supine active flexion 2x10 Sidelying abduction x10 with clinician assist Supine wand abduction x5 Abduction table slides 2x10 Ball rolls up wall x5 (challenging) Scap retraction-5" x20 Bent over row x20 Bent over extension x20  Shoulder isometrics 5" x10ea- flex/abd/IR   PATIENT EDUCATION: Education details:  HEP, POC, objective findings and progress.  Person educated: Patient Education method: Explanation, Demonstration, Tactile cues,  Verbal cues Education comprehension: verbalized  understanding, returned demonstration, verbal cues required, tactile cues required, and needs further education  HOME EXERCISE PROGRAM: Access Code: [email or web address] URL: [email or web address] Prepared by: Ronny Flurry  Updated HEP: - Isometric Shoulder Flexion at Wall  - 1-2 x daily - 5 x weekly - 1 sets - 10 reps - 5 seconds hold - Isometric Shoulder Abduction at Wall  - 1-2 x daily - 5 x weekly - 1 sets - 10 reps - 5 seconds hold - Standing Isometric Shoulder Internal Rotation at Doorway  - 1-2 x daily - 5 x weekly - 1 sets - 10 reps - 5 seconds hold  ASSESSMENT:  CLINICAL IMPRESSION:  Initiated active abduction today which was challenging for pt. Provided her with AAROM abduction activities to try at home. No complaints with PROM. Pt able to complete isometric strengthening today without cuing required for form. Able to reach behind back to waistline without pain. Educated her about staying within pain limitations with HEP.  OBJECTIVE IMPAIRMENTS: decreased activity tolerance, decreased endurance, decreased ROM, decreased strength, hypomobility, impaired flexibility, impaired UE functional use, and pain.   ACTIVITY LIMITATIONS: carrying, lifting, bathing, dressing, reach over head, and hygiene/grooming  PARTICIPATION LIMITATIONS: meal prep, cleaning, laundry, driving, shopping, and community activity  PERSONAL FACTORS: 1 comorbidity: R shoulder arthroscopy  are also affecting patient's functional outcome.   REHAB POTENTIAL: Good  CLINICAL DECISION MAKING: Stable/uncomplicated  EVALUATION COMPLEXITY: Low   GOALS:  SHORT TERM GOALS: Target date: 05/14/2022   Pt will be independent and compliant with HEP for improved pain, ROM, and function.  Baseline: Goal status: MET 05/27/22 Target date:  05/21/2022  2.  Pt will demo improved L shoulder PROM to at least 140 deg in flexion and 50 deg in ER for improved shoulder mobility and stiffness.  Baseline:  Goal status: MET  05/27/22 Target date:  05/07/2022  3.  Pt will tolerate AAROM as allowed by MD without adverse effects for improved shoulder mobility.  Baseline:  Goal status: MET 05/27/22 Target date:  05/14/2022  4.  Pt will wean out of sling without adverse effects.  Baseline:  Goal status: MET 05/27/22 Target date:  05/14/2022  5.  Pt will demo L shoulder AAROM in supine to be at least 120 deg in flexion and 50 deg in ER for improved shoulder mobility and stiffness.  Baseline:  Goal status: IN PROGRESS Target date:  05/21/2022  6.  Pt will be able to actively elevate L shoulder at least 90 deg without significant shoulder hike.  Baseline:  Goal status: IN PROGRESS Target date:  06/11/2022  7.  Pt will report she is able to perform her self care activities with no > than minimal difficulty.  Baseline:  Goal status: IN PROGRESS Target date:  06/18/2022    LONG TERM GOALS: Target date: 08/06/2022   Pt will demo L shoulder AROM to be [facility] t/o for performance of ADLs and IADLs.   Baseline:  Goal status: IN PROGRESS  2.  Pt will be able to perform her ADLs and IADLs without significant difficulty and pain.  Baseline:  Goal status: IN PROGRESS  3.  Pt will be able to perform her normal reaching and overhead activities without significant pain and limitation.  Baseline:  Goal status: IN PROGRESS  4.  Pt will be able to perform quilting without adverse effects.  Baseline:  Goal status: INITIAL  5.  Pt will demo improved L shoulder strength to at least 4+/5 MMT t/o  for functional carrying/lifting and performance of household chores.  Baseline:  Goal status: INITIAL    PLAN:  PT FREQUENCY:  2x/wk   PT DURATION: other: 6-10 weeks  PLANNED INTERVENTIONS: Therapeutic exercises, Therapeutic activity, Neuromuscular re-education, Patient/Family education, Self Care, Joint mobilization, Aquatic Therapy, Dry Needling, Electrical stimulation, Spinal mobilization, Cryotherapy, Moist heat, scar  mobilization, Taping, Ultrasound, Manual therapy, and Re-evaluation  PLAN FOR NEXT SESSION:  PROM, AAROM, AROM, strengthening per protocol   Sherlynn Carbon, PTA  05/29/22 4:24 PM

## 2022-06-02 ENCOUNTER — Encounter (HOSPITAL_BASED_OUTPATIENT_CLINIC_OR_DEPARTMENT_OTHER): Payer: Medicare Other

## 2022-06-03 ENCOUNTER — Ambulatory Visit
Admission: RE | Admit: 2022-06-03 | Discharge: 2022-06-03 | Disposition: A | Discharge status: Home / Self Care | Payer: Medicare Other | Source: Ambulatory Visit | Attending: Surgical | Admitting: Surgical

## 2022-06-03 DIAGNOSIS — G8929 Other chronic pain: Secondary | ICD-10-CM

## 2022-06-05 ENCOUNTER — Ambulatory Visit (HOSPITAL_BASED_OUTPATIENT_CLINIC_OR_DEPARTMENT_OTHER): Payer: Medicare Other

## 2022-06-05 ENCOUNTER — Encounter (HOSPITAL_BASED_OUTPATIENT_CLINIC_OR_DEPARTMENT_OTHER): Payer: Self-pay

## 2022-06-05 DIAGNOSIS — M25512 Pain in left shoulder: Secondary | ICD-10-CM | POA: Diagnosis not present

## 2022-06-05 DIAGNOSIS — M6281 Muscle weakness (generalized): Secondary | ICD-10-CM

## 2022-06-05 DIAGNOSIS — M25612 Stiffness of left shoulder, not elsewhere classified: Secondary | ICD-10-CM

## 2022-06-05 NOTE — Therapy (Signed)
OUTPATIENT PHYSICAL THERAPY SHOULDER TREATMENT   Progress Note Reporting Period 04/16/2022 to 05/27/2022  See note below for Objective Data and Assessment of Progress/Goals.       Patient Name: Chelsey Jackson MRN: [id number] DOB:Apr 23, 1950, 72 y.o., female Today's Date: 06/05/2022  END OF SESSION:  PT End of Session - 06/05/22 1008     Visit Number 12    Number of Visits 28    Date for PT Re-Evaluation 07/10/22    Authorization Type MCR A and B    Progress Note Due on Visit 10    PT Start Time 0932    PT Stop Time 1014    PT Time Calculation (min) 42 min    Activity Tolerance Patient tolerated treatment well    Behavior During Therapy WFL for tasks assessed/performed                        Past Medical History:  Diagnosis Date   AC (acromioclavicular) joint bone spurs    left shoulder with chronic pain, w/limitation ROM   Allergy    weekly allergy shots   Anemia    hx of   Anxiety    Arthritis    Asthma    Depression    Fibrocystic breast disease    GERD (gastroesophageal reflux disease)    OSA (obstructive sleep apnea) 07/31/2013   Peptic ulcer disease    TMJ locking    Past Surgical History:  Procedure Laterality Date   ABDOMINAL HYSTERECTOMY  2003   due to fibroids   ADENOIDECTOMY     x3   BICEPT TENODESIS Left 03/25/2022   Procedure: BICEPS TENODESIS;  Surgeon: Meredith Pel, MD;  Location: [place];  Service: Orthopedics;  Laterality: Left;   BIOPSY  06/29/2018   Procedure: BIOPSY;  Surgeon: Lavena Bullion, DO;  Location: [facility];  Service: Gastroenterology;;   BREAST CYST ASPIRATION     COLONOSCOPY  03/10/2007   results-normal   COLONOSCOPY WITH PROPOFOL N/A 06/29/2018   Procedure: COLONOSCOPY WITH PROPOFOL;  Surgeon: Lavena Bullion, DO;  Location: [facility];  Service: Gastroenterology;  Laterality: N/A;   DILATION AND CURETTAGE OF UTERUS     HAMMER TOE SURGERY     POLYPECTOMY  06/29/2018   Procedure:  POLYPECTOMY;  Surgeon: Lavena Bullion, DO;  Location: [facility];  Service: Gastroenterology;;   PUBOVAGINAL SLING  '90's   for incontinence   [place] Right 2013   [place]   ROTATOR CUFF REPAIR Left 2012   [place]   SHOULDER ARTHROSCOPY WITH ROTATOR CUFF REPAIR AND SUBACROMIAL DECOMPRESSION Left 03/25/2022   Procedure: LEFT SHOULDER ARTHROSCOPY, SUBACROMIAL DECOMPRESSION, DEBRIDEMENT, MINI OPEN ROTATOR CUFF TEAR REPAIR;  Surgeon: Meredith Pel, MD;  Location: [place];  Service: Orthopedics;  Laterality: Left;   TONSILLECTOMY     x2   TUBAL LIGATION     [place]   WRIST SURGERY     Patient Active Problem List   Diagnosis Date Noted   Complete tear of left rotator cuff 03/29/2022   Biceps tendinitis on left 03/29/2022   Degenerative superior labral 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 (SLAP) tear of left shoulder 03/29/2022   Synovitis of left shoulder 03/29/2022   Family history of heart disease 01/07/2022   Allergic rhinitis 09/12/2020   Allergic rhinitis due to animal (cat) (dog) hair and dander 09/12/2020   Mild intermittent asthma 09/12/2020   Vasomotor rhinitis 09/12/2020   Hyperlipidemia,  mild 05/17/2020   Impingement syndrome of left shoulder 04/03/2020   Urge incontinence 11/05/2018   Diverticulosis of colon without hemorrhage    Primary osteoarthritis of first carpometacarpal joint of left hand 06/22/2018   Chronic pain of left thumb 06/22/2018   History of retinal tear 10/20/2016   Cough 05/02/2016   Low back pain 09/28/2015   Insomnia 08/18/2013   OSA (obstructive sleep apnea) 07/31/2013   Thyroid nodule, cold 07/15/2012   Anxiety and depression 10/05/2007   Allergic rhinitis due to pollen 10/05/2007   Asthma 10/05/2007    PCP: [name], PA   REFERRING PROVIDER: Meredith Pel, MD  REFERRING DIAG: [phone] (ICD-10-CM) - Tear of left supraspinatus tendon  THERAPY DIAG:  Left shoulder pain,  unspecified chronicity  Stiffness of left shoulder, not elsewhere classified  Muscle weakness (generalized)  Rationale for Evaluation and Treatment: Rehabilitation  ONSET DATE: DOS 03/25/2022 Days since surgery: 72   SUBJECTIVE:                                                                                                                                                                                      SUBJECTIVE STATEMENT:  Pt reports continued pain in upper arm, not so much in shoulder joint. 1/10 pain at entry.    PERTINENT HISTORY: L shoulder mini open RCR, biceps tenodesis, and labral debridement on 03/25/22.  PROM x 3 weeks Hx of bilat shoulder arthroscopies Depression and Anxiety which is controlled  PAIN:  1/10 current, 5/10 worst  PRECAUTIONS: Other: surgical protocol; PROM currently  WEIGHT BEARING RESTRICTIONS: Yes L UE  FALLS:  Has patient fallen in last 6 months? No   OCCUPATION: Pt is retired  PLOF: Independent.  Pt was able to perform her ADLs and IADLs independently.  Pt performs water aerobics 3x/wk.  Pt enjoys quilting and embroidering.    PATIENT GOALS: to fasten bra in the back.  To return to water aerobics and be able to quilt.  Return to her normal activities including driving.     OBJECTIVE: 05/27/22   -L shoulder ROM:  PROM:  Flexion:  140 deg, Abd:  123 deg, ER:  53 deg, IR:  59 deg  AROM:  (Supine)  Flex:  137deg     TODAY'S TREATMENT:     Therex: PROM L shoulder Pulleys- 2' flexion and abduction each Ball rolls at table- flexion and abduction x15ea Ball rolls up wall x5  Ball stabilization rolls at wall 4 way (up/down, side to side, cw/ccw) x10 ea Scap retraction-5" x20 Bicep curls/hammer curls- 2# x10 each  PATIENT EDUCATION: Education details:  HEP, POC, objective findings and progress.  Person educated:  Patient Education method: Explanation, Demonstration, Tactile cues, Verbal cues Education comprehension: verbalized  understanding, returned demonstration, verbal cues required, tactile cues required, and needs further education  HOME EXERCISE PROGRAM: Access Code: [email or web address] URL: [email or web address] Prepared by: Ronny Flurry  Updated HEP: - Isometric Shoulder Flexion at Wall  - 1-2 x daily - 5 x weekly - 1 sets - 10 reps - 5 seconds hold - Isometric Shoulder Abduction at Wall  - 1-2 x daily - 5 x weekly - 1 sets - 10 reps - 5 seconds hold - Standing Isometric Shoulder Internal Rotation at Doorway  - 1-2 x daily - 5 x weekly - 1 sets - 10 reps - 5 seconds hold  ASSESSMENT:  CLINICAL IMPRESSION:  While laying supine during PROM, pt experienced episode of sudden dizziness and sat up on EOB. This subsided after a couple minutes of sitting. Pt reports this is not typical, but has happened before. After this, focused more on seated and standing strengthening to avoid recurrence of this. PT lacks strength and endurance in L UE so continued to focus on this. Pt becomes sore and fatigued in biceps anterior deltoid. Provided updated HEP to add ball rolls at wall. No other episodes of dizziness throughout session, but did instruct pt to monitor this and notify PCP if it continues.   OBJECTIVE IMPAIRMENTS: decreased activity tolerance, decreased endurance, decreased ROM, decreased strength, hypomobility, impaired flexibility, impaired UE functional use, and pain.   ACTIVITY LIMITATIONS: carrying, lifting, bathing, dressing, reach over head, and hygiene/grooming  PARTICIPATION LIMITATIONS: meal prep, cleaning, laundry, driving, shopping, and community activity  PERSONAL FACTORS: 1 comorbidity: R shoulder arthroscopy  are also affecting patient's functional outcome.   REHAB POTENTIAL: Good  CLINICAL DECISION MAKING: Stable/uncomplicated  EVALUATION COMPLEXITY: Low   GOALS:  SHORT TERM GOALS: Target date: 05/14/2022   Pt will be independent and compliant with HEP for improved pain, ROM, and  function.  Baseline: Goal status: MET 05/27/22 Target date:  05/21/2022  2.  Pt will demo improved L shoulder PROM to at least 140 deg in flexion and 50 deg in ER for improved shoulder mobility and stiffness.  Baseline:  Goal status: MET 05/27/22 Target date:  05/07/2022  3.  Pt will tolerate AAROM as allowed by MD without adverse effects for improved shoulder mobility.  Baseline:  Goal status: MET 05/27/22 Target date:  05/14/2022  4.  Pt will wean out of sling without adverse effects.  Baseline:  Goal status: MET 05/27/22 Target date:  05/14/2022  5.  Pt will demo L shoulder AAROM in supine to be at least 120 deg in flexion and 50 deg in ER for improved shoulder mobility and stiffness.  Baseline:  Goal status: IN PROGRESS Target date:  05/21/2022  6.  Pt will be able to actively elevate L shoulder at least 90 deg without significant shoulder hike.  Baseline:  Goal status: IN PROGRESS Target date:  06/11/2022  7.  Pt will report she is able to perform her self care activities with no > than minimal difficulty.  Baseline:  Goal status: IN PROGRESS Target date:  06/18/2022    LONG TERM GOALS: Target date: 08/06/2022   Pt will demo L shoulder AROM to be [facility] t/o for performance of ADLs and IADLs.   Baseline:  Goal status: IN PROGRESS  2.  Pt will be able to perform her ADLs and IADLs without significant difficulty and pain.  Baseline:  Goal status: IN PROGRESS  3.  Pt will be able  to perform her normal reaching and overhead activities without significant pain and limitation.  Baseline:  Goal status: IN PROGRESS  4.  Pt will be able to perform quilting without adverse effects.  Baseline:  Goal status: INITIAL  5.  Pt will demo improved L shoulder strength to at least 4+/5 MMT t/o for functional carrying/lifting and performance of household chores.  Baseline:  Goal status: INITIAL    PLAN:  PT FREQUENCY:  2x/wk   PT DURATION: other: 6-10 weeks  PLANNED INTERVENTIONS:  Therapeutic exercises, Therapeutic activity, Neuromuscular re-education, Patient/Family education, Self Care, Joint mobilization, Aquatic Therapy, Dry Needling, Electrical stimulation, Spinal mobilization, Cryotherapy, Moist heat, scar mobilization, Taping, Ultrasound, Manual therapy, and Re-evaluation  PLAN FOR NEXT SESSION:  PROM, AAROM, AROM, strengthening per protocol   Sherlynn Carbon, PTA  06/05/22 10:30 AM

## 2022-06-08 NOTE — Therapy (Signed)
OUTPATIENT PHYSICAL THERAPY SHOULDER TREATMENT        Patient Name: Chelsey Jackson MRN: [id number] DOB:11/10/1950, 72 y.o., female Today's Date: 06/09/2022  END OF SESSION:  PT End of Session - 06/09/22 0929     Visit Number 13    Number of Visits 28    Date for PT Re-Evaluation 07/10/22    Authorization Type MCR A and B    PT Start Time 0849    PT Stop Time 0931    PT Time Calculation (min) 42 min    Activity Tolerance Patient tolerated treatment well    Behavior During Therapy [facility] for tasks assessed/performed                         Past Medical History:  Diagnosis Date   AC (acromioclavicular) joint bone spurs    left shoulder with chronic pain, w/limitation ROM   Allergy    weekly allergy shots   Anemia    hx of   Anxiety    Arthritis    Asthma    Depression    Fibrocystic breast disease    GERD (gastroesophageal reflux disease)    OSA (obstructive sleep apnea) 07/31/2013   Peptic ulcer disease    TMJ locking    Past Surgical History:  Procedure Laterality Date   ABDOMINAL HYSTERECTOMY  2003   due to fibroids   ADENOIDECTOMY     x3   BICEPT TENODESIS Left 03/25/2022   Procedure: BICEPS TENODESIS;  Surgeon: Meredith Pel, MD;  Location: [place];  Service: Orthopedics;  Laterality: Left;   BIOPSY  06/29/2018   Procedure: BIOPSY;  Surgeon: Lavena Bullion, DO;  Location: [facility];  Service: Gastroenterology;;   BREAST CYST ASPIRATION     COLONOSCOPY  03/10/2007   results-normal   COLONOSCOPY WITH PROPOFOL N/A 06/29/2018   Procedure: COLONOSCOPY WITH PROPOFOL;  Surgeon: Lavena Bullion, DO;  Location: [facility];  Service: Gastroenterology;  Laterality: N/A;   DILATION AND CURETTAGE OF UTERUS     HAMMER TOE SURGERY     POLYPECTOMY  06/29/2018   Procedure: POLYPECTOMY;  Surgeon: Lavena Bullion, DO;  Location: [facility];  Service: Gastroenterology;;   PUBOVAGINAL SLING  '90's   for incontinence   [place] Right 2013   [place]   ROTATOR CUFF REPAIR Left 2012   [place]   SHOULDER ARTHROSCOPY WITH ROTATOR CUFF REPAIR AND SUBACROMIAL DECOMPRESSION Left 03/25/2022   Procedure: LEFT SHOULDER ARTHROSCOPY, SUBACROMIAL DECOMPRESSION, DEBRIDEMENT, MINI OPEN ROTATOR CUFF TEAR REPAIR;  Surgeon: Meredith Pel, MD;  Location: [place];  Service: Orthopedics;  Laterality: Left;   TONSILLECTOMY     x2   TUBAL LIGATION     [place]   WRIST SURGERY     Patient Active Problem List   Diagnosis Date Noted   Complete tear of left rotator cuff 03/29/2022   Biceps tendinitis on left 03/29/2022   Degenerative superior labral 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 (SLAP) tear of left shoulder 03/29/2022   Synovitis of left shoulder 03/29/2022   Family history of heart disease 01/07/2022   Allergic rhinitis 09/12/2020   Allergic rhinitis due to animal (cat) (dog) hair and dander 09/12/2020   Mild intermittent asthma 09/12/2020   Vasomotor rhinitis 09/12/2020   Hyperlipidemia, mild 05/17/2020   Impingement syndrome of left shoulder 04/03/2020   Urge incontinence 11/05/2018   Diverticulosis of colon without hemorrhage    Primary osteoarthritis  of first carpometacarpal joint of left hand 06/22/2018   Chronic pain of left thumb 06/22/2018   History of retinal tear 10/20/2016   Cough 05/02/2016   Low back pain 09/28/2015   Insomnia 08/18/2013   OSA (obstructive sleep apnea) 07/31/2013   Thyroid nodule, cold 07/15/2012   Anxiety and depression 10/05/2007   Allergic rhinitis due to pollen 10/05/2007   Asthma 10/05/2007    PCP: [name], PA   REFERRING PROVIDER: Meredith Pel, MD  REFERRING DIAG: [phone] (ICD-10-CM) - Tear of left supraspinatus tendon  THERAPY DIAG:  Left shoulder pain, unspecified chronicity  Stiffness of left shoulder, not elsewhere classified  Muscle weakness (generalized)  Rationale for Evaluation and Treatment: Rehabilitation  ONSET  DATE: DOS 03/25/2022 Days since surgery: 76   SUBJECTIVE:                                                                                                                                                                                      SUBJECTIVE STATEMENT:  Pt is 10 weeks and 6 days s/p  L shoulder RCR, biceps tenodesis, and labral debridement.  Pt states she has increased pain, soreness, and fatigue in the mornings.  Pt denies any adverse effects after prior Rx.  She used ice after Rx which she is typical for her.  Pt has been working out with a trainer for the past 2-3 weeks.  She reports compliance with HEP.  Pt denies any dizziness over the last weekend.           PERTINENT HISTORY: L shoulder mini open RCR, biceps tenodesis, and labral debridement on 03/25/22.  PROM x 3 weeks Hx of bilat shoulder arthroscopies Depression and Anxiety which is controlled  PAIN:  2/10 current, 5/10 worst  PRECAUTIONS: Other: surgical protocol; PROM currently  WEIGHT BEARING RESTRICTIONS: Yes L UE  FALLS:  Has patient fallen in last 6 months? No   OCCUPATION: Pt is retired  PLOF: Independent.  Pt was able to perform her ADLs and IADLs independently.  Pt performs water aerobics 3x/wk.  Pt enjoys quilting and embroidering.    PATIENT GOALS: to fasten bra in the back.  To return to water aerobics and be able to quilt.  Return to her normal activities including driving.     OBJECTIVE:      TODAY'S TREATMENT:      Therex: Pt received L shoulder PROM in flexion, abd, ER, and IR per protocol ranges w/n pt and tissue tolerance.  Supine serratus punches 1 x 5, 2x10 Supine shoulder ABC x 1 rep with multiple rest breaks due to fatigue Supine flexion AROM 2x4 reps and 1x2 reps Supine  rhythmic stab's with gentle tapping proximally 2x30 sec S/L abduction with PT assist 2 x 5 Bent over row 2x10 Standing wall walks in flexion x 2 reps   PATIENT EDUCATION: Education details:  HEP, POC, exercise  form, and exercise rationale Person educated: Patient Education method: Explanation, Demonstration, Tactile cues, Verbal cues Education comprehension: verbalized understanding, returned demonstration, verbal cues required, tactile cues required, and needs further education  HOME EXERCISE PROGRAM: Access Code: [email or web address] URL: [email or web address] Prepared by: Ronny Flurry    ASSESSMENT:  CLINICAL IMPRESSION:  Pt is progressing with R shoulder ROM.  She tolerated PROM well.  Pt does have some tightness and limitations with PROM, which is worse with abduction.  Pt's R shoulder fatigues quickly with AROM exercises having to take rest breaks during sets.  PT had pt perform standing wall walks in flexion at the end of Rx and pt only able to perform 2 repetitions due to fatigue.  Pt has weakness t/o R shoulder and scapula.  Pt reports having no pain after Rx, though states her shoulder feels uncomfortable.  She states she can't wait to ice her shoulder after Rx and is going to ice when she returns home.  Pt should benefit from cont skilled PT services to address impairments and goals and to assist in restoring desired level of function.   OBJECTIVE IMPAIRMENTS: decreased activity tolerance, decreased endurance, decreased ROM, decreased strength, hypomobility, impaired flexibility, impaired UE functional use, and pain.   ACTIVITY LIMITATIONS: carrying, lifting, bathing, dressing, reach over head, and hygiene/grooming  PARTICIPATION LIMITATIONS: meal prep, cleaning, laundry, driving, shopping, and community activity  PERSONAL FACTORS: 1 comorbidity: R shoulder arthroscopy  are also affecting patient's functional outcome.   REHAB POTENTIAL: Good  CLINICAL DECISION MAKING: Stable/uncomplicated  EVALUATION COMPLEXITY: Low   GOALS:  SHORT TERM GOALS: Target date: 05/14/2022   Pt will be independent and compliant with HEP for improved pain, ROM, and function.  Baseline: Goal  status: MET 05/27/22 Target date:  05/21/2022  2.  Pt will demo improved L shoulder PROM to at least 140 deg in flexion and 50 deg in ER for improved shoulder mobility and stiffness.  Baseline:  Goal status: MET 05/27/22 Target date:  05/07/2022  3.  Pt will tolerate AAROM as allowed by MD without adverse effects for improved shoulder mobility.  Baseline:  Goal status: MET 05/27/22 Target date:  05/14/2022  4.  Pt will wean out of sling without adverse effects.  Baseline:  Goal status: MET 05/27/22 Target date:  05/14/2022  5.  Pt will demo L shoulder AAROM in supine to be at least 120 deg in flexion and 50 deg in ER for improved shoulder mobility and stiffness.  Baseline:  Goal status: IN PROGRESS Target date:  05/21/2022  6.  Pt will be able to actively elevate L shoulder at least 90 deg without significant shoulder hike.  Baseline:  Goal status: IN PROGRESS Target date:  06/11/2022  7.  Pt will report she is able to perform her self care activities with no > than minimal difficulty.  Baseline:  Goal status: IN PROGRESS Target date:  06/18/2022    LONG TERM GOALS: Target date: 08/06/2022   Pt will demo L shoulder AROM to be [facility] t/o for performance of ADLs and IADLs.   Baseline:  Goal status: IN PROGRESS  2.  Pt will be able to perform her ADLs and IADLs without significant difficulty and pain.  Baseline:  Goal status: IN PROGRESS  3.  Pt will  be able to perform her normal reaching and overhead activities without significant pain and limitation.  Baseline:  Goal status: IN PROGRESS  4.  Pt will be able to perform quilting without adverse effects.  Baseline:  Goal status: INITIAL  5.  Pt will demo improved L shoulder strength to at least 4+/5 MMT t/o for functional carrying/lifting and performance of household chores.  Baseline:  Goal status: INITIAL    PLAN:  PT FREQUENCY:  2x/wk   PT DURATION: other: 6-10 weeks  PLANNED INTERVENTIONS: Therapeutic exercises,  Therapeutic activity, Neuromuscular re-education, Patient/Family education, Self Care, Joint mobilization, Aquatic Therapy, Dry Needling, Electrical stimulation, Spinal mobilization, Cryotherapy, Moist heat, scar mobilization, Taping, Ultrasound, Manual therapy, and Re-evaluation  PLAN FOR NEXT SESSION:  PROM, AAROM, AROM, strengthening per protocol   Selinda Michaels III PT, DPT 06/09/22 9:59 AM

## 2022-06-09 ENCOUNTER — Encounter (HOSPITAL_BASED_OUTPATIENT_CLINIC_OR_DEPARTMENT_OTHER): Payer: Self-pay | Admitting: Physical Therapy

## 2022-06-09 ENCOUNTER — Ambulatory Visit (HOSPITAL_BASED_OUTPATIENT_CLINIC_OR_DEPARTMENT_OTHER): Payer: Medicare Other | Admitting: Physical Therapy

## 2022-06-09 DIAGNOSIS — M25512 Pain in left shoulder: Secondary | ICD-10-CM

## 2022-06-09 DIAGNOSIS — M6281 Muscle weakness (generalized): Secondary | ICD-10-CM

## 2022-06-09 DIAGNOSIS — M25612 Stiffness of left shoulder, not elsewhere classified: Secondary | ICD-10-CM

## 2022-06-11 ENCOUNTER — Ambulatory Visit (HOSPITAL_BASED_OUTPATIENT_CLINIC_OR_DEPARTMENT_OTHER): Payer: Medicare Other | Admitting: Physical Therapy

## 2022-06-11 DIAGNOSIS — M6281 Muscle weakness (generalized): Secondary | ICD-10-CM

## 2022-06-11 DIAGNOSIS — M25512 Pain in left shoulder: Secondary | ICD-10-CM

## 2022-06-11 DIAGNOSIS — M25612 Stiffness of left shoulder, not elsewhere classified: Secondary | ICD-10-CM

## 2022-06-11 NOTE — Therapy (Signed)
OUTPATIENT PHYSICAL THERAPY SHOULDER TREATMENT        Patient Name: Chelsey Jackson MRN: [id number] DOB:09-15-1950, 72 y.o., female Today's Date: 06/12/2022  END OF SESSION:  PT End of Session - 06/11/22 0856     Visit Number 14    Number of Visits 28    Date for PT Re-Evaluation 07/10/22    Authorization Type MCR A and B    PT Start Time 0854    PT Stop Time 0935    PT Time Calculation (min) 41 min    Activity Tolerance Patient tolerated treatment well    Behavior During Therapy [facility] for tasks assessed/performed                         Past Medical History:  Diagnosis Date   AC (acromioclavicular) joint bone spurs    left shoulder with chronic pain, w/limitation ROM   Allergy    weekly allergy shots   Anemia    hx of   Anxiety    Arthritis    Asthma    Depression    Fibrocystic breast disease    GERD (gastroesophageal reflux disease)    OSA (obstructive sleep apnea) 07/31/2013   Peptic ulcer disease    TMJ locking    Past Surgical History:  Procedure Laterality Date   ABDOMINAL HYSTERECTOMY  2003   due to fibroids   ADENOIDECTOMY     x3   BICEPT TENODESIS Left 03/25/2022   Procedure: BICEPS TENODESIS;  Surgeon: Meredith Pel, MD;  Location: [place];  Service: Orthopedics;  Laterality: Left;   BIOPSY  06/29/2018   Procedure: BIOPSY;  Surgeon: Lavena Bullion, DO;  Location: [facility];  Service: Gastroenterology;;   BREAST CYST ASPIRATION     COLONOSCOPY  03/10/2007   results-normal   COLONOSCOPY WITH PROPOFOL N/A 06/29/2018   Procedure: COLONOSCOPY WITH PROPOFOL;  Surgeon: Lavena Bullion, DO;  Location: [facility];  Service: Gastroenterology;  Laterality: N/A;   DILATION AND CURETTAGE OF UTERUS     HAMMER TOE SURGERY     POLYPECTOMY  06/29/2018   Procedure: POLYPECTOMY;  Surgeon: Lavena Bullion, DO;  Location: [facility];  Service: Gastroenterology;;   PUBOVAGINAL SLING  '90's   for incontinence   [place] Right 2013   [place]   ROTATOR CUFF REPAIR Left 2012   [place]   SHOULDER ARTHROSCOPY WITH ROTATOR CUFF REPAIR AND SUBACROMIAL DECOMPRESSION Left 03/25/2022   Procedure: LEFT SHOULDER ARTHROSCOPY, SUBACROMIAL DECOMPRESSION, DEBRIDEMENT, MINI OPEN ROTATOR CUFF TEAR REPAIR;  Surgeon: Meredith Pel, MD;  Location: [place];  Service: Orthopedics;  Laterality: Left;   TONSILLECTOMY     x2   TUBAL LIGATION     [place]   WRIST SURGERY     Patient Active Problem List   Diagnosis Date Noted   Complete tear of left rotator cuff 03/29/2022   Biceps tendinitis on left 03/29/2022   Degenerative superior labral 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 (SLAP) tear of left shoulder 03/29/2022   Synovitis of left shoulder 03/29/2022   Family history of heart disease 01/07/2022   Allergic rhinitis 09/12/2020   Allergic rhinitis due to animal (cat) (dog) hair and dander 09/12/2020   Mild intermittent asthma 09/12/2020   Vasomotor rhinitis 09/12/2020   Hyperlipidemia, mild 05/17/2020   Impingement syndrome of left shoulder 04/03/2020   Urge incontinence 11/05/2018   Diverticulosis of colon without hemorrhage    Primary osteoarthritis  of first carpometacarpal joint of left hand 06/22/2018   Chronic pain of left thumb 06/22/2018   History of retinal tear 10/20/2016   Cough 05/02/2016   Low back pain 09/28/2015   Insomnia 08/18/2013   OSA (obstructive sleep apnea) 07/31/2013   Thyroid nodule, cold 07/15/2012   Anxiety and depression 10/05/2007   Allergic rhinitis due to pollen 10/05/2007   Asthma 10/05/2007    PCP: [name], PA   REFERRING PROVIDER: Meredith Pel, MD  REFERRING DIAG: [phone] (ICD-10-CM) - Tear of left supraspinatus tendon  THERAPY DIAG:  Left shoulder pain, unspecified chronicity  Stiffness of left shoulder, not elsewhere classified  Muscle weakness (generalized)  Rationale for Evaluation and Treatment: Rehabilitation  ONSET  DATE: DOS 03/25/2022 Days since surgery: 78   SUBJECTIVE:                                                                                                                                                                                      SUBJECTIVE STATEMENT:  Pt is 11 weeks and 1 day s/p  L shoulder RCR, biceps tenodesis, and labral debridement.  Pt iced her shoulder after prior Rx and had no adverse effects.  Pt was hosting/playing Mahjong and didn't realize she was leaning on her L elbow putting pressure thru L LE multiple times.  She had increased pain when realizing she was putting pressure thru L UE.  Pt used Aleve last night. Pt states she has increased pain, soreness, and fatigue in the mornings.  Pt has been working out with a trainer for the past 2-3 weeks.  She reports compliance with HEP.  Pt denies any dizziness over the last weekend.           PERTINENT HISTORY: L shoulder mini open RCR, biceps tenodesis, and labral debridement on 03/25/22.  PROM x 3 weeks Hx of bilat shoulder arthroscopies Depression and Anxiety which is controlled  PAIN:  6/10 current, 5/10 worst  PRECAUTIONS: Other: surgical protocol; PROM currently  WEIGHT BEARING RESTRICTIONS: Yes L UE  FALLS:  Has patient fallen in last 6 months? No   OCCUPATION: Pt is retired  PLOF: Independent.  Pt was able to perform her ADLs and IADLs independently.  Pt performs water aerobics 3x/wk.  Pt enjoys quilting and embroidering.    PATIENT GOALS: to fasten bra in the back.  To return to water aerobics and be able to quilt.  Return to her normal activities including driving.     OBJECTIVE:      TODAY'S TREATMENT:      Therex: Pt received gentle shoulder oscillations f/b L shoulder PROM in flexion, abd, ER, and IR per  protocol ranges w/n pt and tissue tolerance.  Supine serratus punches 2x10 Supine shoulder ABC x 1 rep with 1 rest break due to fatigue Supine flexion AROM 2x10 reps Supine rhythmic stab's with  gentle tapping proximally 2x30 sec S/L abduction with PT assist 1 x 10 Bent over row 2x10 Standing wall slides with towel in flexion approx 8 reps   PATIENT EDUCATION: Education details:  HEP, POC, exercise form, and exercise rationale Person educated: Patient Education method: Explanation, Demonstration, Tactile cues, Verbal cues Education comprehension: verbalized understanding, returned demonstration, verbal cues required, tactile cues required, and needs further education  HOME EXERCISE PROGRAM: Access Code: [email or web address] URL: [email or web address] Prepared by: Ronny Flurry  Updated HEP: - Supine Single Arm Shoulder Protraction  - 1 x daily - 7 x weekly - 2 sets - 10 reps - Supine Shoulder Alphabet  - 1 x daily - 7 x weekly - 1 reps    ASSESSMENT:  CLINICAL IMPRESSION:  Pt presents to Rx today reporting increased shoulder pain due to leaning on L UE while playing Mahjong for an extended amount of time at home.  Pt demonstrates much improved tolerance with exercises today being able to perform increased reps with decreased rest.  Pt performed exercises well with cuing and instruction for correct form.  She continued to have fatigue with exercises though had better tolerance.  Pt has weakness t/o R shoulder and scapula.  PT updated HEP and Pt demonstrates good understanding.  Pt responded well to Rx having no increased pain after Rx.  She states she will use ice on her shoulder when she returns home.  Pt should benefit from cont skilled PT services to address impairments and goals and to assist in restoring desired level of function.     OBJECTIVE IMPAIRMENTS: decreased activity tolerance, decreased endurance, decreased ROM, decreased strength, hypomobility, impaired flexibility, impaired UE functional use, and pain.   ACTIVITY LIMITATIONS: carrying, lifting, bathing, dressing, reach over head, and hygiene/grooming  PARTICIPATION LIMITATIONS: meal prep, cleaning,  laundry, driving, shopping, and community activity  PERSONAL FACTORS: 1 comorbidity: R shoulder arthroscopy  are also affecting patient's functional outcome.   REHAB POTENTIAL: Good  CLINICAL DECISION MAKING: Stable/uncomplicated  EVALUATION COMPLEXITY: Low   GOALS:  SHORT TERM GOALS: Target date: 05/14/2022   Pt will be independent and compliant with HEP for improved pain, ROM, and function.  Baseline: Goal status: MET 05/27/22 Target date:  05/21/2022  2.  Pt will demo improved L shoulder PROM to at least 140 deg in flexion and 50 deg in ER for improved shoulder mobility and stiffness.  Baseline:  Goal status: MET 05/27/22 Target date:  05/07/2022  3.  Pt will tolerate AAROM as allowed by MD without adverse effects for improved shoulder mobility.  Baseline:  Goal status: MET 05/27/22 Target date:  05/14/2022  4.  Pt will wean out of sling without adverse effects.  Baseline:  Goal status: MET 05/27/22 Target date:  05/14/2022  5.  Pt will demo L shoulder AAROM in supine to be at least 120 deg in flexion and 50 deg in ER for improved shoulder mobility and stiffness.  Baseline:  Goal status: IN PROGRESS Target date:  05/21/2022  6.  Pt will be able to actively elevate L shoulder at least 90 deg without significant shoulder hike.  Baseline:  Goal status: IN PROGRESS Target date:  06/11/2022  7.  Pt will report she is able to perform her self care activities with no > than minimal difficulty.  Baseline:  Goal status: IN PROGRESS Target date:  06/18/2022    LONG TERM GOALS: Target date: 08/06/2022   Pt will demo L shoulder AROM to be [facility] t/o for performance of ADLs and IADLs.   Baseline:  Goal status: IN PROGRESS  2.  Pt will be able to perform her ADLs and IADLs without significant difficulty and pain.  Baseline:  Goal status: IN PROGRESS  3.  Pt will be able to perform her normal reaching and overhead activities without significant pain and limitation.  Baseline:  Goal  status: IN PROGRESS  4.  Pt will be able to perform quilting without adverse effects.  Baseline:  Goal status: INITIAL  5.  Pt will demo improved L shoulder strength to at least 4+/5 MMT t/o for functional carrying/lifting and performance of household chores.  Baseline:  Goal status: INITIAL    PLAN:  PT FREQUENCY:  2x/wk   PT DURATION: other: 6-10 weeks  PLANNED INTERVENTIONS: Therapeutic exercises, Therapeutic activity, Neuromuscular re-education, Patient/Family education, Self Care, Joint mobilization, Aquatic Therapy, Dry Needling, Electrical stimulation, Spinal mobilization, Cryotherapy, Moist heat, scar mobilization, Taping, Ultrasound, Manual therapy, and Re-evaluation  PLAN FOR NEXT SESSION:  PROM, AAROM, AROM, strengthening per protocol   Selinda Michaels III PT, DPT 06/12/22 10:15 AM

## 2022-06-12 ENCOUNTER — Encounter (HOSPITAL_BASED_OUTPATIENT_CLINIC_OR_DEPARTMENT_OTHER): Payer: Self-pay | Admitting: Physical Therapy

## 2022-06-17 ENCOUNTER — Ambulatory Visit (HOSPITAL_BASED_OUTPATIENT_CLINIC_OR_DEPARTMENT_OTHER): Payer: Medicare Other

## 2022-06-17 ENCOUNTER — Encounter (HOSPITAL_BASED_OUTPATIENT_CLINIC_OR_DEPARTMENT_OTHER): Payer: Self-pay

## 2022-06-17 DIAGNOSIS — M25512 Pain in left shoulder: Secondary | ICD-10-CM

## 2022-06-17 DIAGNOSIS — M6281 Muscle weakness (generalized): Secondary | ICD-10-CM

## 2022-06-17 DIAGNOSIS — M25612 Stiffness of left shoulder, not elsewhere classified: Secondary | ICD-10-CM

## 2022-06-17 NOTE — Therapy (Signed)
OUTPATIENT PHYSICAL THERAPY SHOULDER TREATMENT        Patient Name: Chelsey Jackson MRN: [id number] DOB:1950-05-07, 72 y.o., female Today's Date: 06/17/2022  END OF SESSION:  PT End of Session - 06/17/22 1017     Visit Number 15    Number of Visits 28    Date for PT Re-Evaluation 07/10/22    Authorization Type MCR A and B    Progress Note Due on Visit 20    PT Start Time 0933    PT Stop Time 1013    PT Time Calculation (min) 40 min    Activity Tolerance Patient tolerated treatment well    Behavior During Therapy WFL for tasks assessed/performed                          Past Medical History:  Diagnosis Date   AC (acromioclavicular) joint bone spurs    left shoulder with chronic pain, w/limitation ROM   Allergy    weekly allergy shots   Anemia    hx of   Anxiety    Arthritis    Asthma    Depression    Fibrocystic breast disease    GERD (gastroesophageal reflux disease)    OSA (obstructive sleep apnea) 07/31/2013   Peptic ulcer disease    TMJ locking    Past Surgical History:  Procedure Laterality Date   ABDOMINAL HYSTERECTOMY  2003   due to fibroids   ADENOIDECTOMY     x3   BICEPT TENODESIS Left 03/25/2022   Procedure: BICEPS TENODESIS;  Surgeon: Meredith Pel, MD;  Location: [place];  Service: Orthopedics;  Laterality: Left;   BIOPSY  06/29/2018   Procedure: BIOPSY;  Surgeon: Lavena Bullion, DO;  [facility];  Service: Gastroenterology;;   BREAST CYST ASPIRATION     COLONOSCOPY  03/10/2007   results-normal   COLONOSCOPY WITH PROPOFOL N/A 06/29/2018   Procedure: COLONOSCOPY WITH PROPOFOL;  Surgeon: Lavena Bullion, DO;  [facility];  Service: Gastroenterology;  Laterality: N/A;   DILATION AND CURETTAGE OF UTERUS     HAMMER TOE SURGERY     POLYPECTOMY  06/29/2018   Procedure: POLYPECTOMY;  Surgeon: Lavena Bullion, DO;  [facility];  Service: Gastroenterology;;   PUBOVAGINAL SLING  '90's    for incontinence   [place] Right 2013   [place]   ROTATOR CUFF REPAIR Left 2012   [place]   SHOULDER ARTHROSCOPY WITH ROTATOR CUFF REPAIR AND SUBACROMIAL DECOMPRESSION Left 03/25/2022   Procedure: LEFT SHOULDER ARTHROSCOPY, SUBACROMIAL DECOMPRESSION, DEBRIDEMENT, MINI OPEN ROTATOR CUFF TEAR REPAIR;  Surgeon: Meredith Pel, MD;  Location: [place];  Service: Orthopedics;  Laterality: Left;   TONSILLECTOMY     x2   TUBAL LIGATION     [place]   WRIST SURGERY     Patient Active Problem List   Diagnosis Date Noted   Complete tear of left rotator cuff 03/29/2022   Biceps tendinitis on left 03/29/2022   Degenerative superior labral 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 (SLAP) tear of left shoulder 03/29/2022   Synovitis of left shoulder 03/29/2022   Family history of heart disease 01/07/2022   Allergic rhinitis 09/12/2020   Allergic rhinitis due to animal (cat) (dog) hair and dander 09/12/2020   Mild intermittent asthma 09/12/2020   Vasomotor rhinitis 09/12/2020   Hyperlipidemia, mild 05/17/2020   Impingement syndrome of left shoulder 04/03/2020   Urge incontinence 11/05/2018  Diverticulosis of colon without hemorrhage    Primary osteoarthritis of first carpometacarpal joint of left hand 06/22/2018   Chronic pain of left thumb 06/22/2018   History of retinal tear 10/20/2016   Cough 05/02/2016   Low back pain 09/28/2015   Insomnia 08/18/2013   OSA (obstructive sleep apnea) 07/31/2013   Thyroid nodule, cold 07/15/2012   Anxiety and depression 10/05/2007   Allergic rhinitis due to pollen 10/05/2007   Asthma 10/05/2007    PCP: [name], PA   REFERRING PROVIDER: Meredith Pel, MD  REFERRING DIAG: [phone] (ICD-10-CM) - Tear of left supraspinatus tendon  THERAPY DIAG:  Stiffness of left shoulder, not elsewhere classified  Muscle weakness (generalized)  Left shoulder pain, unspecified chronicity  Rationale for Evaluation  and Treatment: Rehabilitation  ONSET DATE: DOS 03/25/2022 Days since surgery: 84   SUBJECTIVE:                                                                                                                                                                                      SUBJECTIVE STATEMENT:  Pt reports she played in a [place] tournament over the weekend. Increased stiffness/pain today. "It always hurts in the mornings. I can't move it on it's own at first."  Has been going to water aerobics 3x/week and does get sore afterwards.    PERTINENT HISTORY: L shoulder mini open RCR, biceps tenodesis, and labral debridement on 03/25/22.  PROM x 3 weeks Hx of bilat shoulder arthroscopies Depression and Anxiety which is controlled  PAIN:  6/10 current, 5/10 worst  PRECAUTIONS: Other: surgical protocol; PROM currently  WEIGHT BEARING RESTRICTIONS: Yes L UE  FALLS:  Has patient fallen in last 6 months? No   OCCUPATION: Pt is retired  PLOF: Independent.  Pt was able to perform her ADLs and IADLs independently.  Pt performs water aerobics 3x/wk.  Pt enjoys quilting and embroidering.    PATIENT GOALS: to fasten bra in the back.  To return to water aerobics and be able to quilt.  Return to her normal activities including driving.     OBJECTIVE:      TODAY'S TREATMENT:      Therex: Pt received gentle shoulder oscillations f/b L shoulder PROM in flexion, abd, ER, and IR per protocol ranges w/n pt and tissue tolerance.  Supine serratus punches 2x10 Supine shoulder ABC x 1 rep with 1 rest break due to fatigue/pain Supine flexion AROM 2x10 reps Supine rhythmic stab's with gentle tapping proximally 2x30 sec S/L abduction 2x10 S/L ER 2x10 TB Row-RTB 2x10 Pulleys- 2'ea flexion/abduction    PATIENT EDUCATION: Education details:  HEP, POC, exercise form, and exercise rationale Person educated:  Patient Education method: Explanation, Demonstration, Tactile cues, Verbal cues Education  comprehension: verbalized understanding, returned demonstration, verbal cues required, tactile cues required, and needs further education  HOME EXERCISE PROGRAM: Access Code: [email or web address] URL: [email or web address] Prepared by: Ronny Flurry  Updated HEP: - Supine Single Arm Shoulder Protraction  - 1 x daily - 7 x weekly - 2 sets - 10 reps - Supine Shoulder Alphabet  - 1 x daily - 7 x weekly - 1 reps    ASSESSMENT:  CLINICAL IMPRESSION:  Pt demonstrates increased L shoulder discomfort and stiffness today. Painful at end ranges each plane passively. She is very tender to palpation of L lats. Pt continues to experience audible "clicking" in L shoulder with elevation. Sees MD for f/u tomorrow. Discussed with pt avoiding strenuous movements in water aerobics. Educated pt about ways to "warm up" her shoulder in the mornings with gentle exercises.    OBJECTIVE IMPAIRMENTS: decreased activity tolerance, decreased endurance, decreased ROM, decreased strength, hypomobility, impaired flexibility, impaired UE functional use, and pain.   ACTIVITY LIMITATIONS: carrying, lifting, bathing, dressing, reach over head, and hygiene/grooming  PARTICIPATION LIMITATIONS: meal prep, cleaning, laundry, driving, shopping, and community activity  PERSONAL FACTORS: 1 comorbidity: R shoulder arthroscopy  are also affecting patient's functional outcome.   REHAB POTENTIAL: Good  CLINICAL DECISION MAKING: Stable/uncomplicated  EVALUATION COMPLEXITY: Low   GOALS:  SHORT TERM GOALS: Target date: 05/14/2022   Pt will be independent and compliant with HEP for improved pain, ROM, and function.  Baseline: Goal status: MET 05/27/22 Target date:  05/21/2022  2.  Pt will demo improved L shoulder PROM to at least 140 deg in flexion and 50 deg in ER for improved shoulder mobility and stiffness.  Baseline:  Goal status: MET 05/27/22 Target date:  05/07/2022  3.  Pt will tolerate AAROM as allowed by MD  without adverse effects for improved shoulder mobility.  Baseline:  Goal status: MET 05/27/22 Target date:  05/14/2022  4.  Pt will wean out of sling without adverse effects.  Baseline:  Goal status: MET 05/27/22 Target date:  05/14/2022  5.  Pt will demo L shoulder AAROM in supine to be at least 120 deg in flexion and 50 deg in ER for improved shoulder mobility and stiffness.  Baseline:  Goal status: IN PROGRESS Target date:  05/21/2022  6.  Pt will be able to actively elevate L shoulder at least 90 deg without significant shoulder hike.  Baseline:  Goal status: IN PROGRESS Target date:  06/11/2022  7.  Pt will report she is able to perform her self care activities with no > than minimal difficulty.  Baseline:  Goal status: IN PROGRESS Target date:  06/18/2022    LONG TERM GOALS: Target date: 08/06/2022   Pt will demo L shoulder AROM to be [facility] t/o for performance of ADLs and IADLs.   Baseline:  Goal status: IN PROGRESS  2.  Pt will be able to perform her ADLs and IADLs without significant difficulty and pain.  Baseline:  Goal status: IN PROGRESS  3.  Pt will be able to perform her normal reaching and overhead activities without significant pain and limitation.  Baseline:  Goal status: IN PROGRESS  4.  Pt will be able to perform quilting without adverse effects.  Baseline:  Goal status: INITIAL  5.  Pt will demo improved L shoulder strength to at least 4+/5 MMT t/o for functional carrying/lifting and performance of household chores.  Baseline:  Goal status: INITIAL  PLAN:  PT FREQUENCY:  2x/wk   PT DURATION: other: 6-10 weeks  PLANNED INTERVENTIONS: Therapeutic exercises, Therapeutic activity, Neuromuscular re-education, Patient/Family education, Self Care, Joint mobilization, Aquatic Therapy, Dry Needling, Electrical stimulation, Spinal mobilization, Cryotherapy, Moist heat, scar mobilization, Taping, Ultrasound, Manual therapy, and Re-evaluation  PLAN FOR NEXT  SESSION:  PROM, AAROM, AROM, strengthening per protocol   Sherlynn Carbon, PTA  06/17/22 11:55 AM

## 2022-06-18 ENCOUNTER — Ambulatory Visit (INDEPENDENT_AMBULATORY_CARE_PROVIDER_SITE_OTHER): Payer: Medicare Other | Admitting: Orthopedic Surgery

## 2022-06-18 DIAGNOSIS — M5412 Radiculopathy, cervical region: Secondary | ICD-10-CM

## 2022-06-19 ENCOUNTER — Encounter (HOSPITAL_BASED_OUTPATIENT_CLINIC_OR_DEPARTMENT_OTHER): Payer: Self-pay | Admitting: Physical Therapy

## 2022-06-19 ENCOUNTER — Ambulatory Visit (HOSPITAL_BASED_OUTPATIENT_CLINIC_OR_DEPARTMENT_OTHER): Payer: Medicare Other | Admitting: Physical Therapy

## 2022-06-19 ENCOUNTER — Ambulatory Visit
Admission: RE | Admit: 2022-06-19 | Discharge: 2022-06-19 | Disposition: A | Discharge status: Home / Self Care | Payer: Medicare Other | Source: Ambulatory Visit | Attending: Physician Assistant | Admitting: Physician Assistant

## 2022-06-19 ENCOUNTER — Encounter: Payer: Self-pay | Admitting: Physician Assistant

## 2022-06-19 DIAGNOSIS — M25612 Stiffness of left shoulder, not elsewhere classified: Secondary | ICD-10-CM

## 2022-06-19 DIAGNOSIS — M6281 Muscle weakness (generalized): Secondary | ICD-10-CM

## 2022-06-19 DIAGNOSIS — Z1231 Encounter for screening mammogram for malignant neoplasm of breast: Secondary | ICD-10-CM

## 2022-06-19 DIAGNOSIS — M25512 Pain in left shoulder: Secondary | ICD-10-CM

## 2022-06-19 NOTE — Therapy (Signed)
OUTPATIENT PHYSICAL THERAPY SHOULDER TREATMENT        Patient Name: Chelsey Jackson MRN: [id number] DOB:04/05/1951, 72 y.o., female Today's Date: 06/19/2022  END OF SESSION:  PT End of Session - 06/19/22 1447     Visit Number 16    Number of Visits 16    Authorization Type MCR A and B    PT Start Time 1440    PT Stop Time 1525    PT Time Calculation (min) 45 min    Activity Tolerance Patient tolerated treatment well    Behavior During Therapy WFL for tasks assessed/performed                          Past Medical History:  Diagnosis Date   AC (acromioclavicular) joint bone spurs    left shoulder with chronic pain, w/limitation ROM   Allergy    weekly allergy shots   Anemia    hx of   Anxiety    Arthritis    Asthma    Depression    Fibrocystic breast disease    GERD (gastroesophageal reflux disease)    OSA (obstructive sleep apnea) 07/31/2013   Peptic ulcer disease    TMJ locking    Past Surgical History:  Procedure Laterality Date   ABDOMINAL HYSTERECTOMY  2003   due to fibroids   ADENOIDECTOMY     x3   BICEPT TENODESIS Left 03/25/2022   Procedure: BICEPS TENODESIS;  Surgeon: Meredith Pel, MD;  Location: [place];  Service: Orthopedics;  Laterality: Left;   BIOPSY  06/29/2018   Procedure: BIOPSY;  Surgeon: Lavena Bullion, DO;  Location: [facility];  Service: Gastroenterology;;   BREAST CYST ASPIRATION     COLONOSCOPY  03/10/2007   results-normal   COLONOSCOPY WITH PROPOFOL N/A 06/29/2018   Procedure: COLONOSCOPY WITH PROPOFOL;  Surgeon: Lavena Bullion, DO;  Location: [facility];  Service: Gastroenterology;  Laterality: N/A;   DILATION AND CURETTAGE OF UTERUS     HAMMER TOE SURGERY     POLYPECTOMY  06/29/2018   Procedure: POLYPECTOMY;  Surgeon: Lavena Bullion, DO;  Location: [facility];  Service: Gastroenterology;;   PUBOVAGINAL SLING  '90's   for incontinence   [place] Right 2013    [place]   ROTATOR CUFF REPAIR Left 2012   [place]   SHOULDER ARTHROSCOPY WITH ROTATOR CUFF REPAIR AND SUBACROMIAL DECOMPRESSION Left 03/25/2022   Procedure: LEFT SHOULDER ARTHROSCOPY, SUBACROMIAL DECOMPRESSION, DEBRIDEMENT, MINI OPEN ROTATOR CUFF TEAR REPAIR;  Surgeon: Meredith Pel, MD;  Location: [place];  Service: Orthopedics;  Laterality: Left;   TONSILLECTOMY     x2   TUBAL LIGATION     [place]   WRIST SURGERY     Patient Active Problem List   Diagnosis Date Noted   Complete tear of left rotator cuff 03/29/2022   Biceps tendinitis on left 03/29/2022   Degenerative superior labral 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 (SLAP) tear of left shoulder 03/29/2022   Synovitis of left shoulder 03/29/2022   Family history of heart disease 01/07/2022   Allergic rhinitis 09/12/2020   Allergic rhinitis due to animal (cat) (dog) hair and dander 09/12/2020   Mild intermittent asthma 09/12/2020   Vasomotor rhinitis 09/12/2020   Hyperlipidemia, mild 05/17/2020   Impingement syndrome of left shoulder 04/03/2020   Urge incontinence 11/05/2018   Diverticulosis of colon without hemorrhage    Primary osteoarthritis of first carpometacarpal joint of left hand  06/22/2018   Chronic pain of left thumb 06/22/2018   History of retinal tear 10/20/2016   Cough 05/02/2016   Low back pain 09/28/2015   Insomnia 08/18/2013   OSA (obstructive sleep apnea) 07/31/2013   Thyroid nodule, cold 07/15/2012   Anxiety and depression 10/05/2007   Allergic rhinitis due to pollen 10/05/2007   Asthma 10/05/2007    PCP: [name], PA   REFERRING PROVIDER: Meredith Pel, MD  REFERRING DIAG: [phone] (ICD-10-CM) - Tear of left supraspinatus tendon  THERAPY DIAG:  Stiffness of left shoulder, not elsewhere classified  Muscle weakness (generalized)  Left shoulder pain, unspecified chronicity  Rationale for Evaluation and Treatment: Rehabilitation  ONSET DATE: DOS 03/25/2022 Days since  surgery: 86   SUBJECTIVE:                                                                                                                                                                                      SUBJECTIVE STATEMENT:  Pt is 12 weeks and 2 days s/p L shoulder RCR, biceps tenodesis, and labral debridement.  Pt denies any adverse effects after prior Rx.  Pt used ice after prior Rx.  Pt saw MD yesterday and he informed she doesn't need anymore PT.  He informed her to come to 1 more PT appt and then be finished with PT.  Pt states MD is very pleased with her ROM though she needs to work on strengthening.  Pt reports he released her to perform resistance in the pool.  Pt informed MD about the clicking in her shoulder.  Pt has been doing a little quilting without significant pain.  Pt states she still can't fasten her bra.  Pt is able to cook though is limited with cutting melon.      PERTINENT HISTORY: L shoulder mini open RCR, biceps tenodesis, and labral debridement on 03/25/22.  PROM x 3 weeks Hx of bilat shoulder arthroscopies Depression and Anxiety which is controlled  PAIN:  1/10 current  PRECAUTIONS: Other: surgical protocol; PROM currently  WEIGHT BEARING RESTRICTIONS: Yes L UE  FALLS:  Has patient fallen in last 6 months? No   OCCUPATION: Pt is retired  PLOF: Independent.  Pt was able to perform her ADLs and IADLs independently.  Pt performs water aerobics 3x/wk.  Pt enjoys quilting and embroidering.    PATIENT GOALS: to fasten bra in the back.  To return to water aerobics and be able to quilt.  Return to her normal activities including driving.     OBJECTIVE:      TODAY'S TREATMENT:      Therex:  Supine serratus punches 2x10 with 1# Supine shoulder ABC  x 1 rep with 1# with 1 rest break at W Supine flexion AROM x10 reps S/L ER 2x10 TB Row-RTB 2x10 Standing shoulder extension with retraction x 10 reps though stopped on 2nd set due to anterior  shoulder/biceps pain. Attempted jobe's flexion though stopped due to having a 5/10 pain Standing ER/IR with arm at side with YTB 3x10  PT reviewed and updated HEP.  Pt received a HEP handout and was educated in correct form and appropriate frequency.  PT instructed pt in appropriate frequency of ROM exercises vs resistance exercises.     FOTO:  15 prior / 75 Currently.  Goal:  40    PATIENT EDUCATION: Education details:  HEP, POC, exercise form, and exercise rationale Person educated: Patient Education method: Explanation, Demonstration, Tactile cues, Verbal cues Education comprehension: verbalized understanding, returned demonstration, verbal cues required, tactile cues required, and needs further education  HOME EXERCISE PROGRAM: Access Code: [email or web address] URL: [email or web address] Prepared by: Ronny Flurry  Updated HEP: - Shoulder External Rotation with Anchored Resistance  - 1 x daily - 3 x weekly - 2-3 sets - 10 reps - Shoulder Internal Rotation with Resistance  - 1 x daily - 3 x weekly - 2-3 sets - 10 reps - Standing Shoulder Row with Anchored Resistance  - 1 x daily - 3-4 x weekly - 2-3 sets - 10 reps - Sidelying Shoulder External Rotation  - 1 x daily - 5 x weekly - 2-3 sets - 10 reps    ASSESSMENT:  CLINICAL IMPRESSION:  Pt returns to PT after seeing MD and states MD has discharged her from PT.  Pt states MD was pleased with her ROM though needs to work on strengthening.  PT worked on progressing her HEP today including light strengthening exercises per protocol.  PT updated HEP and gave pt a HEP handout.  Pt demonstrates good understanding of HEP.  PT did not include exercises in HEP that caused pain.  Pt has made progress in PT with shoulder ROM and performance of functional activities though continues to have limitations with reaching and performing ADLs and IADLs.  Pt has been doing a little quilting without significant pain.  Pt responded well to Rx.  She  will be discharged from PT due to MD discharge.    OBJECTIVE IMPAIRMENTS: decreased activity tolerance, decreased endurance, decreased ROM, decreased strength, hypomobility, impaired flexibility, impaired UE functional use, and pain.   ACTIVITY LIMITATIONS: carrying, lifting, bathing, dressing, reach over head, and hygiene/grooming  PARTICIPATION LIMITATIONS: meal prep, cleaning, laundry, driving, shopping, and community activity  PERSONAL FACTORS: 1 comorbidity: R shoulder arthroscopy  are also affecting patient's functional outcome.   REHAB POTENTIAL: Good  CLINICAL DECISION MAKING: Stable/uncomplicated  EVALUATION COMPLEXITY: Low   GOALS:  SHORT TERM GOALS: Target date: 05/14/2022   Pt will be independent and compliant with HEP for improved pain, ROM, and function.  Baseline: Goal status: MET 05/27/22 Target date:  05/21/2022  2.  Pt will demo improved L shoulder PROM to at least 140 deg in flexion and 50 deg in ER for improved shoulder mobility and stiffness.  Baseline:  Goal status: MET 05/27/22 Target date:  05/07/2022  3.  Pt will tolerate AAROM as allowed by MD without adverse effects for improved shoulder mobility.  Baseline:  Goal status: MET 05/27/22 Target date:  05/14/2022  4.  Pt will wean out of sling without adverse effects.  Baseline:  Goal status: MET 05/27/22 Target date:  05/14/2022  5.  Pt will demo L  shoulder AAROM in supine to be at least 120 deg in flexion and 50 deg in ER for improved shoulder mobility and stiffness.  Baseline:  Goal status: IN PROGRESS Target date:  05/21/2022  6.  Pt will be able to actively elevate L shoulder at least 90 deg without significant shoulder hike.  Baseline:  Goal status: IN PROGRESS Target date:  06/11/2022  7.  Pt will report she is able to perform her self care activities with no > than minimal difficulty.  Baseline:  Goal status: IN PROGRESS Target date:  06/18/2022    LONG TERM GOALS: Target date:  08/06/2022   Pt will demo L shoulder AROM to be [facility] t/o for performance of ADLs and IADLs.   Baseline:  Goal status: IN PROGRESS  2.  Pt will be able to perform her ADLs and IADLs without significant difficulty and pain.  Baseline:  Goal status: IN PROGRESS  3.  Pt will be able to perform her normal reaching and overhead activities without significant pain and limitation.  Baseline:  Goal status: IN PROGRESS  4.  Pt will be able to perform quilting without adverse effects.  Baseline:  Goal status: progressing  5.  Pt will demo improved L shoulder strength to at least 4+/5 MMT t/o for functional carrying/lifting and performance of household chores.  Baseline:  Goal status: INITIAL    PLAN:  PT FREQUENCY:  2x/wk   PT DURATION: other: 6-10 weeks  PLANNED INTERVENTIONS: Therapeutic exercises, Therapeutic activity, Neuromuscular re-education, Patient/Family education, Self Care, Joint mobilization, Aquatic Therapy, Dry Needling, Electrical stimulation, Spinal mobilization, Cryotherapy, Moist heat, scar mobilization, Taping, Ultrasound, Manual therapy, and Re-evaluation  PLAN FOR NEXT SESSION:  Pt to be discharged from skilled PT services due to MD discharge.  Pt is agreeable with discharge and will cont with HEP.  PT informed pt to contact PT with any questions that may arise concerning her HEP.     PHYSICAL THERAPY DISCHARGE SUMMARY  Visits from Start of Care: 16  Current functional level related to goals / functional outcomes: See above   Remaining deficits: See above   Education / Equipment: HEP   Selinda Michaels III PT, DPT 06/19/22 10:20 PM

## 2022-06-20 ENCOUNTER — Encounter: Payer: Self-pay | Admitting: Orthopedic Surgery

## 2022-06-20 ENCOUNTER — Other Ambulatory Visit: Payer: Self-pay | Admitting: Physician Assistant

## 2022-06-20 DIAGNOSIS — E041 Nontoxic single thyroid nodule: Secondary | ICD-10-CM

## 2022-06-20 NOTE — Progress Notes (Signed)
Post-Op Visit Note   Patient: Chelsey Jackson           Date of Birth: 06-11-1950           MRN: [id number] Visit Date: 06/18/2022 PCP: [name], PA   Assessment & Plan:  Chief Complaint:  Chief Complaint  Patient presents with   Left Shoulder - Follow-up    03/25/22 (12w 1d)Left Shoulder Arthroscopy, Subacromial Decompression, Debridement, Mini Open Rotator Cuff Tear Repair  Biceps Tenodesis      Visit Diagnoses:  1. Radiculopathy, cervical region     Plan: [name] is a 72 year old patient who underwent left shoulder arthroscopy mini open rotator cuff tear repair 04/04/2022.  Reports some constant aching and popping daily.  Not taking any medication for the problem.  She also is having some neck pain and has a known C5-6 herniated disc on the right-hand side.  On examination patient has passive range of motion of 30/90/150.  Slight amount of crepitus is present but it feels more like postsurgical man recurrent rotator cuff tear.  I think her bigger problem may be this neck pain.  Plan for referral to Dr. Ernestina Patches for cervical spine ESI.  Could reimage the shoulder in several months if her symptoms continue but even if she does have a small recurrent tear she would not really want to consider any type of repeat surgical intervention nor that necessarily be indicated in this case.  Follow-Up Instructions: No follow-ups on file.   Orders:  Orders Placed This Encounter  Procedures   Ambulatory referral to Physical Medicine Rehab   No orders of the defined types were placed in this encounter.   Imaging: No results found.  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 (SLAP) tear of left shoulder 03/29/2022   Synovitis of left shoulder 03/29/2022   Family history of heart disease 01/07/2022   Allergic  rhinitis 09/12/2020   Allergic rhinitis due to animal (cat) (dog) hair and dander 09/12/2020   Mild intermittent asthma 09/12/2020   Vasomotor rhinitis 09/12/2020   Hyperlipidemia, mild 05/17/2020   Impingement syndrome of left shoulder 04/03/2020   Urge incontinence 11/05/2018   Diverticulosis of colon without hemorrhage    Primary osteoarthritis of first carpometacarpal joint of left hand 06/22/2018   Chronic pain of left thumb 06/22/2018   History of retinal tear 10/20/2016   Cough 05/02/2016   Low back pain 09/28/2015   Insomnia 08/18/2013   OSA (obstructive sleep apnea) 07/31/2013   Thyroid nodule, cold 07/15/2012   Anxiety and depression 10/05/2007   Allergic rhinitis due to pollen 10/05/2007   Asthma 10/05/2007   Past Medical History:  Diagnosis Date   AC (acromioclavicular) joint bone spurs    left shoulder with chronic pain, w/limitation ROM   Allergy    weekly allergy shots   Anemia    hx of   Anxiety    Arthritis    Asthma    Depression    Fibrocystic breast disease    GERD (gastroesophageal reflux disease)    OSA (obstructive sleep apnea) 07/31/2013   Peptic ulcer disease    TMJ locking     Family History  Problem Relation Age of Onset   Osteoporosis Mother    Fibroids Mother    Other Mother        aortic valve replacement 1994, abdominal  aortic aneurysm   Lung cancer Mother 42   Heart disease Father    Testicular cancer Father 94   Prostate cancer Father 22   Leukemia Father        CLL   Diabetes Maternal Grandmother    Heart disease Maternal Grandfather    Heart disease Paternal Grandmother    Diabetes Paternal Grandfather    Stomach cancer Paternal Grandfather    Breast cancer Paternal Aunt    Breast cancer Cousin    Colon cancer Neg Hx    Throat cancer Neg Hx    Pancreatic cancer Neg Hx     Past Surgical History:  Procedure Laterality Date   ABDOMINAL HYSTERECTOMY  2003   due to fibroids   ADENOIDECTOMY     x3   BICEPT TENODESIS Left  03/25/2022   Procedure: BICEPS TENODESIS;  Surgeon: Meredith Pel, MD;  Location: [place];  Service: Orthopedics;  Laterality: Left;   BIOPSY  06/29/2018   Procedure: BIOPSY;  Surgeon: Lavena Bullion, DO;  Location: [facility];  Service: Gastroenterology;;   BREAST CYST ASPIRATION     COLONOSCOPY  03/10/2007   results-normal   COLONOSCOPY WITH PROPOFOL N/A 06/29/2018   Procedure: COLONOSCOPY WITH PROPOFOL;  Surgeon: Lavena Bullion, DO;  Location: [facility];  Service: Gastroenterology;  Laterality: N/A;   DILATION AND CURETTAGE OF UTERUS     HAMMER TOE SURGERY     POLYPECTOMY  06/29/2018   Procedure: POLYPECTOMY;  Surgeon: Lavena Bullion, DO;  Location: [facility];  Service: Gastroenterology;;   PUBOVAGINAL SLING  '90's   for incontinence   [place] Right 2013   [place]   ROTATOR CUFF REPAIR Left 2012   [place]   SHOULDER ARTHROSCOPY WITH ROTATOR CUFF REPAIR AND SUBACROMIAL DECOMPRESSION Left 03/25/2022   Procedure: LEFT SHOULDER ARTHROSCOPY, SUBACROMIAL DECOMPRESSION, DEBRIDEMENT, MINI OPEN ROTATOR CUFF TEAR REPAIR;  Surgeon: Meredith Pel, MD;  Location: [place];  Service: Orthopedics;  Laterality: Left;   TONSILLECTOMY     x2   TUBAL LIGATION     TURBINATE REDUCTION  1997   WRIST SURGERY     Social History   Occupational History   Not on file  Tobacco Use   Smoking status: Former    Packs/day: 2.00    Years: 15.00    Total pack years: 30.00    Types: Cigarettes    Quit date: 11/19/1989    Years since quitting: 32.6   Smokeless tobacco: Never  Vaping Use   Vaping Use: Never used  Substance and Sexual Activity   Alcohol use: Yes    Comment: maybe a glass of wine on the weekend   Drug use: No   Sexual activity: Not on file

## 2022-07-02 ENCOUNTER — Ambulatory Visit: Payer: Medicare Other

## 2022-07-02 ENCOUNTER — Ambulatory Visit (INDEPENDENT_AMBULATORY_CARE_PROVIDER_SITE_OTHER): Payer: Medicare Other | Admitting: Physical Medicine and Rehabilitation

## 2022-07-02 VITALS — BP 131/82 | HR 67

## 2022-07-02 DIAGNOSIS — M5412 Radiculopathy, cervical region: Secondary | ICD-10-CM | POA: Diagnosis not present

## 2022-07-02 MED ORDER — METHYLPREDNISOLONE ACETATE 80 MG/ML IJ SUSP
80.0000 mg | Freq: Once | INTRAMUSCULAR | Status: AC
  Administered 2022-07-02: 80 mg

## 2022-07-02 NOTE — Progress Notes (Signed)
Functional Pain Scale - descriptive words and definitions  Uncomfortable (3)  Pain is present but can complete all ADL's/sleep is slightly affected and passive distraction only gives marginal relief. Mild range order  Average Pain 2   +Driver, -BT, -Dye Allergies.  Neck pain in the middle

## 2022-07-02 NOTE — Patient Instructions (Signed)

## 2022-07-04 ENCOUNTER — Ambulatory Visit (HOSPITAL_COMMUNITY)
Admission: RE | Admit: 2022-07-04 | Discharge: 2022-07-04 | Disposition: A | Discharge status: Home / Self Care | Payer: Medicare Other | Source: Ambulatory Visit | Attending: Physician Assistant | Admitting: Physician Assistant

## 2022-07-04 DIAGNOSIS — E041 Nontoxic single thyroid nodule: Secondary | ICD-10-CM | POA: Insufficient documentation

## 2022-07-09 NOTE — Procedures (Signed)
Cervical Epidural Steroid Injection - Interlaminar Approach with Fluoroscopic Guidance  Patient: Chelsey Jackson      Date of Birth: 1950-08-08 MRN: [id number] PCP: [name], PA      Visit Date: 07/02/2022   Universal Protocol:    Date/Time: 03/20/241:26 PM  Consent Given By: the patient  Position: PRONE  Additional Comments: Vital signs were monitored before and after the procedure. Patient was prepped and draped in the usual sterile fashion. The correct patient, procedure, and site was verified.   Injection Procedure Details:   Procedure diagnoses: Cervical radiculopathy [M54.12]    Meds Administered:  Meds ordered this encounter  Medications   methylPREDNISolone acetate (DEPO-MEDROL) injection 80 mg     Laterality: Left  Location/Site: C7-T1  Needle: 3.5 in., 20 ga. Tuohy  Needle Placement: Paramedian epidural space  Findings:  -Comments: Excellent flow of contrast into the epidural space.  Procedure Details: Using a paramedian approach from the side mentioned above, the region overlying the inferior lamina was localized under fluoroscopic visualization and the soft tissues overlying this structure were infiltrated with 4 ml. of 1% Lidocaine without Epinephrine. A # 20 gauge, Tuohy needle was inserted into the epidural space using a paramedian approach.  The epidural space was localized using loss of resistance along with contralateral oblique bi-planar fluoroscopic views.  After negative aspirate for air, blood, and CSF, a 2 ml. volume of Isovue-250 was injected into the epidural space and the flow of contrast was observed. Radiographs were obtained for documentation purposes.   The injectate was administered into the level noted above.  Additional Comments:  No complications occurred Dressing: 2 x 2 sterile gauze and Band-Aid    Post-procedure details: Patient was observed during the procedure. Post-procedure instructions were reviewed.  Patient  left the clinic in stable condition.

## 2022-07-09 NOTE — Progress Notes (Signed)
Chelsey Jackson - 72 y.o. female MRN [id number]  Date of birth: 03-Sep-1950  Office Visit Note: Visit Date: 07/02/2022 PCP: [name], PA Referred by: [name], [place]  Subjective: Chief Complaint  Patient presents with   Neck - Pain   HPI:  Chelsey Jackson is a 72 y.o. female who comes in today at the request of Dr. Anderson Malta for planned Left C7-T1 Cervical Interlaminar epidural steroid injection with fluoroscopic guidance.  The patient has failed conservative care including home exercise, medications, time and activity modification.  This injection will be diagnostic and hopefully therapeutic.  Please see requesting physician notes for further details and justification.   ROS Otherwise per HPI.  Assessment & Plan: Visit Diagnoses:    ICD-10-CM   1. Cervical radiculopathy  M54.12 XR C-ARM NO REPORT    Epidural Steroid injection    methylPREDNISolone acetate (DEPO-MEDROL) injection 80 mg      Plan: No additional findings.   Meds & Orders:  Meds ordered this encounter  Medications   methylPREDNISolone acetate (DEPO-MEDROL) injection 80 mg    Orders Placed This Encounter  Procedures   XR C-ARM NO REPORT   Epidural Steroid injection    Follow-up: Return for visit to requesting provider as needed.   Procedures: No procedures performed  Cervical Epidural Steroid Injection - Interlaminar Approach with Fluoroscopic Guidance  Patient: Chelsey Jackson      Date of Birth: Jun 06, 1950 MRN: [id number] PCP: [name], PA      Visit Date: 07/02/2022   Universal Protocol:    Date/Time: 03/20/241:26 PM  Consent Given By: the patient  Position: PRONE  Additional Comments: Vital signs were monitored before and after the procedure. Patient was prepped and draped in the usual sterile fashion. The correct patient, procedure, and site was verified.   Injection Procedure Details:   Procedure diagnoses: Cervical radiculopathy [M54.12]    Meds  Administered:  Meds ordered this encounter  Medications   methylPREDNISolone acetate (DEPO-MEDROL) injection 80 mg     Laterality: Left  Location/Site: C7-T1  Needle: 3.5 in., 20 ga. Tuohy  Needle Placement: Paramedian epidural space  Findings:  -Comments: Excellent flow of contrast into the epidural space.  Procedure Details: Using a paramedian approach from the side mentioned above, the region overlying the inferior lamina was localized under fluoroscopic visualization and the soft tissues overlying this structure were infiltrated with 4 ml. of 1% Lidocaine without Epinephrine. A # 20 gauge, Tuohy needle was inserted into the epidural space using a paramedian approach.  The epidural space was localized using loss of resistance along with contralateral oblique bi-planar fluoroscopic views.  After negative aspirate for air, blood, and CSF, a 2 ml. volume of Isovue-250 was injected into the epidural space and the flow of contrast was observed. Radiographs were obtained for documentation purposes.   The injectate was administered into the level noted above.  Additional Comments:  No complications occurred Dressing: 2 x 2 sterile gauze and Band-Aid    Post-procedure details: Patient was observed during the procedure. Post-procedure instructions were reviewed.  Patient left the clinic in stable condition.   Clinical History: MRI CERVICAL SPINE WITHOUT CONTRAST   TECHNIQUE: Multiplanar, multisequence MR imaging of the cervical spine was performed. No intravenous contrast was administered.   COMPARISON:  Cervical spine radiographs 04/23/2022 without report.   FINDINGS: Alignment: Slight retrolisthesis of C5 on C6.   Vertebrae: No fracture, evidence of discitis, or bone lesion.   Cord: Normal cord signal.  Posterior Fossa, vertebral arteries, paraspinal tissues: Approximately 2.6 cm right thyroid nodule.   Disc levels:   C2-C3: Right facet and uncovertebral  hypertrophy with mild right foraminal stenosis. No significant canal or left foraminal stenosis.   C3-C4: Posterior disc osteophyte complex with bilateral facet uncovertebral hypertrophy. Resulting mild right foraminal stenosis. Patent canal.   C4-C5: Right greater than left facet and uncovertebral hypertrophy. Patent canal and foramina.   C5-C6: Right eccentric posterior disc osteophyte complex with mild canal stenosis and severe right foraminal stenosis. Patent left foramen with a.   C6-C7: Posterior disc osteophyte complex with bilateral facet uncovertebral hypertrophy. No significant stenosis.   C7-T1: No significant disc protrusion, foraminal stenosis, or canal stenosis.   IMPRESSION: 1. At C5-C6, severe right foraminal stenosis with mild canal stenosis. Milder multilevel degenerative changes detailed above. 2. Approximately 2.6 cm right thyroid nodule. Recommend thyroid US (ref: J Am Coll Radiol. 2015 Feb;12(2): 143-50).     Electronically Signed   By: Margaretha Sheffield M.D.   On: 06/04/2022 09:25     Objective:  VS:  HT:    WT:   BMI:     BP:131/82  HR:67bpm  TEMP: ( )  RESP:  Physical Exam Vitals and nursing note reviewed.  Constitutional:      General: She is not in acute distress.    Appearance: Normal appearance. She is not ill-appearing.  HENT:     Head: Normocephalic and atraumatic.     Right Ear: External ear normal.     Left Ear: External ear normal.  Eyes:     Extraocular Movements: Extraocular movements intact.  Cardiovascular:     Rate and Rhythm: Normal rate.     Pulses: Normal pulses.  Musculoskeletal:     Cervical back: Tenderness present. No rigidity.     Right lower leg: No edema.     Left lower leg: No edema.     Comments: Patient has good strength in the upper extremities including 5 out of 5 strength in wrist extension long finger flexion and APB.  There is no atrophy of the hands intrinsically.  There is a negative Hoffmann's  test.   Lymphadenopathy:     Cervical: No cervical adenopathy.  Skin:    Findings: No erythema, lesion or rash.  Neurological:     General: No focal deficit present.     Mental Status: She is alert and oriented to person, place, and time.     Sensory: No sensory deficit.     Motor: No weakness or abnormal muscle tone.     Coordination: Coordination normal.  Psychiatric:        Mood and Affect: Mood normal.        Behavior: Behavior normal.      Imaging: No results found.

## 2022-07-12 ENCOUNTER — Other Ambulatory Visit: Payer: Self-pay | Admitting: Physician Assistant

## 2022-07-15 ENCOUNTER — Telehealth: Payer: Self-pay | Admitting: Physician Assistant

## 2022-07-15 NOTE — Telephone Encounter (Signed)
Contacted Chelsey Jackson to schedule their annual wellness visit. Appointment made for 07/21/2022.  [place] Direct Dial [phone]

## 2022-07-17 ENCOUNTER — Encounter: Payer: Self-pay | Admitting: Orthopedic Surgery

## 2022-07-17 NOTE — Telephone Encounter (Signed)
Does she have f/u?

## 2022-07-17 NOTE — Telephone Encounter (Signed)
Yes Tuesday may need to go see moore but can start wiyh [place]

## 2022-07-21 NOTE — Telephone Encounter (Signed)
Appt scheduled with patient

## 2022-07-22 ENCOUNTER — Encounter: Payer: Self-pay | Admitting: Orthopedic Surgery

## 2022-07-22 ENCOUNTER — Ambulatory Visit (INDEPENDENT_AMBULATORY_CARE_PROVIDER_SITE_OTHER): Payer: Medicare Other | Admitting: Orthopedic Surgery

## 2022-07-22 DIAGNOSIS — G8929 Other chronic pain: Secondary | ICD-10-CM

## 2022-07-22 DIAGNOSIS — M25512 Pain in left shoulder: Secondary | ICD-10-CM | POA: Diagnosis not present

## 2022-07-22 NOTE — Progress Notes (Signed)
Office Visit Note   Patient: Chelsey Jackson           Date of Birth: 1950-11-04           MRN: [id number] Visit Date: 07/22/2022 Requested by: [name], [address] [place],  [place] [address] PCP: [name], [place]  Subjective: Chief Complaint  Patient presents with   Follow-up    HPI: Chelsey Jackson is a 72 y.o. female who presents to the office reporting continued left posterior arm pain.  Since she was last seen she had a cervical spine ESI with Dr. Ernestina Patches on 07/02/2022 with no relief.  Reports continued arm pain which is very focal around the posterior deltoid attachment.  Pain wakes her from sleep at night.  No numbness and tingling in the scapular pain.  No right-sided symptoms.  Doing reasonably well with her rotator cuff repair on that left-hand side.  She is able to fasten her bra..                ROS: All systems reviewed are negative as they relate to the chief complaint within the history of present illness.  Patient denies fevers or chills.  Assessment & Plan: Visit Diagnoses:  1. Chronic left shoulder pain     Plan: Impression is focal posterior arm pain.  Prior radiographs and MRI scan may or may not include that area of maximal tenderness in the field.  Based on duration of symptoms and failure of conservative management MRI scanning of the proximal humerus is indicated with a BB placed at the point of maximal tenderness.  Could be a structural problem with the posterior deltoid triceps or potentially the radial nerve.  Follow-up after that study.  Follow-Up Instructions: No follow-ups on file.   Orders:  Orders Placed This Encounter  Procedures   MR HUMERUS LEFT WO CONTRAST   No orders of the defined types were placed in this encounter.     Procedures: No procedures performed   Clinical Data: No additional findings.  Objective: Vital Signs: LMP  (LMP Unknown)   Physical Exam:  Constitutional: Patient appears  well-developed HEENT:  Head: Normocephalic Eyes:EOM are normal Neck: Normal range of motion Cardiovascular: Normal rate Pulmonary/chest: Effort normal Neurologic: Patient is alert Skin: Skin is warm Psychiatric: Patient has normal mood and affect  Ortho Exam: Ortho exam demonstrates good range of motion of that left shoulder to 50/90/150.  Cuff strength also is good with minimal crepitus with internal/external Tatian of the shoulder.  Motor or sensory function of the hand is intact.  Radial pulse intact.  Patient has focal pain about 2 handbreadth below the acromion posteriorly.  No masses palpable in this area.  Is tender to palpation.  Specialty Comments:  MRI CERVICAL SPINE WITHOUT CONTRAST   TECHNIQUE: Multiplanar, multisequence MR imaging of the cervical spine was performed. No intravenous contrast was administered.   COMPARISON:  Cervical spine radiographs 04/23/2022 without report.   FINDINGS: Alignment: Slight retrolisthesis of C5 on C6.   Vertebrae: No fracture, evidence of discitis, or bone lesion.   Cord: Normal cord signal.   Posterior Fossa, vertebral arteries, paraspinal tissues: Approximately 2.6 cm right thyroid nodule.   Disc levels:   C2-C3: Right facet and uncovertebral hypertrophy with mild right foraminal stenosis. No significant canal or left foraminal stenosis.   C3-C4: Posterior disc osteophyte complex with bilateral facet uncovertebral hypertrophy. Resulting mild right foraminal stenosis. Patent canal.   C4-C5: Right greater than left facet and  uncovertebral hypertrophy. Patent canal and foramina.   C5-C6: Right eccentric posterior disc osteophyte complex with mild canal stenosis and severe right foraminal stenosis. Patent left foramen with a.   C6-C7: Posterior disc osteophyte complex with bilateral facet uncovertebral hypertrophy. No significant stenosis.   C7-T1: No significant disc protrusion, foraminal stenosis, or canal stenosis.    IMPRESSION: 1. At C5-C6, severe right foraminal stenosis with mild canal stenosis. Milder multilevel degenerative changes detailed above. 2. Approximately 2.6 cm right thyroid nodule. Recommend thyroid US (ref: J Am Coll Radiol. 2015 Feb;12(2): 143-50).     Electronically Signed   By: Margaretha Sheffield M.D.   On: 06/04/2022 09:25  Imaging: No results found.   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 (SLAP) tear of left shoulder 03/29/2022   Synovitis of left shoulder 03/29/2022   Family history of heart disease 01/07/2022   Allergic rhinitis 09/12/2020   Allergic rhinitis due to animal (cat) (dog) hair and dander 09/12/2020   Mild intermittent asthma 09/12/2020   Vasomotor rhinitis 09/12/2020   Hyperlipidemia, mild 05/17/2020   Impingement syndrome of left shoulder 04/03/2020   Urge incontinence 11/05/2018   Diverticulosis of colon without hemorrhage    Primary osteoarthritis of first carpometacarpal joint of left hand 06/22/2018   Chronic pain of left thumb 06/22/2018   History of retinal tear 10/20/2016   Cough 05/02/2016   Low back pain 09/28/2015   Insomnia 08/18/2013   OSA (obstructive sleep apnea) 07/31/2013   Thyroid nodule, cold 07/15/2012   Anxiety and depression 10/05/2007   Allergic rhinitis due to pollen 10/05/2007   Asthma 10/05/2007   Past Medical History:  Diagnosis Date   AC (acromioclavicular) joint bone spurs    left shoulder with chronic pain, w/limitation ROM   Allergy    weekly allergy shots   Anemia    hx of   Anxiety    Arthritis    Asthma    Depression    Fibrocystic breast disease    GERD (gastroesophageal reflux disease)    OSA (obstructive sleep apnea) 07/31/2013   Peptic ulcer disease    TMJ locking     Family History  Problem Relation Age of Onset   Osteoporosis  Mother    Fibroids Mother    Other Mother        aortic valve replacement 1994, abdominal aortic aneurysm   Lung cancer Mother 33   Heart disease Father    Testicular cancer Father 62   Prostate cancer Father 36   Leukemia Father        CLL   Diabetes Maternal Grandmother    Heart disease Maternal Grandfather    Heart disease Paternal Grandmother    Diabetes Paternal Grandfather    Stomach cancer Paternal Grandfather    Breast cancer Paternal Aunt    Breast cancer Cousin    Colon cancer Neg Hx    Throat cancer Neg Hx    Pancreatic cancer Neg Hx     Past Surgical History:  Procedure Laterality Date   ABDOMINAL HYSTERECTOMY  2003   due to fibroids   ADENOIDECTOMY     x3   BICEPT TENODESIS Left 03/25/2022   Procedure: BICEPS TENODESIS;  Surgeon: Meredith Pel, MD;  Location: [facility];  Service: Orthopedics;  Laterality: Left;   BIOPSY  06/29/2018   Procedure: BIOPSY;  Surgeon:  Cirigliano, Vito V, DO;  Location: [facility];  Service: Gastroenterology;;   BREAST CYST ASPIRATION     COLONOSCOPY  03/10/2007   results-normal   COLONOSCOPY WITH PROPOFOL N/A 06/29/2018   Procedure: COLONOSCOPY WITH PROPOFOL;  Surgeon: Lavena Bullion, DO;  Location: [facility];  Service: Gastroenterology;  Laterality: N/A;   DILATION AND CURETTAGE OF UTERUS     HAMMER TOE SURGERY     POLYPECTOMY  06/29/2018   Procedure: POLYPECTOMY;  Surgeon: Lavena Bullion, DO;  Location: [facility];  Service: Gastroenterology;;   PUBOVAGINAL SLING  '90's   for incontinence   [place] Right 2013   [facility]   ROTATOR CUFF REPAIR Left 2012   [facility]   SHOULDER ARTHROSCOPY WITH ROTATOR CUFF REPAIR AND SUBACROMIAL DECOMPRESSION Left 03/25/2022   Procedure: LEFT SHOULDER ARTHROSCOPY, SUBACROMIAL DECOMPRESSION, DEBRIDEMENT, MINI OPEN ROTATOR CUFF TEAR REPAIR;  Surgeon: Meredith Pel, MD;  Location: [facility];  Service: Orthopedics;  Laterality: Left;   TONSILLECTOMY      x2   TUBAL LIGATION     TURBINATE REDUCTION  1997   WRIST SURGERY     Social History   Occupational History   Not on file  Tobacco Use   Smoking status: Former    Packs/day: 2.00    Years: 15.00    Additional pack years: 0.00    Total pack years: 30.00    Types: Cigarettes    Quit date: 11/19/1989    Years since quitting: 32.6   Smokeless tobacco: Never  Vaping Use   Vaping Use: Never used  Substance and Sexual Activity   Alcohol use: Yes    Comment: maybe a glass of wine on the weekend   Drug use: No   Sexual activity: Not on file

## 2022-07-29 ENCOUNTER — Ambulatory Visit (INDEPENDENT_AMBULATORY_CARE_PROVIDER_SITE_OTHER): Payer: Medicare Other

## 2022-07-29 VITALS — Wt 178.0 lb

## 2022-07-29 DIAGNOSIS — Z0000 Encounter for general adult medical examination without abnormal findings: Secondary | ICD-10-CM

## 2022-07-29 NOTE — Progress Notes (Signed)
I connected with  Chelsey Jackson on 07/29/22 by a audio enabled telemedicine application and verified that I am speaking with the correct person using two identifiers.  Patient Location: Home  Provider Location: Office/Clinic  I discussed the limitations of evaluation and management by telemedicine. The patient expressed understanding and agreed to proceed.   Subjective:   Chelsey Jackson is a 72 y.o. female who presents for Medicare Annual (Subsequent) preventive examination.  Review of Systems     Cardiac Risk Factors include: advanced age (>1755men, 50>65 women);dyslipidemia     Objective:    Today's Vitals   07/29/22 0902  Weight: 178 lb (80.7 kg)   Body mass index is 27.06 kg/m.     07/29/2022    9:08 AM 04/16/2022   12:14 PM 03/07/2022   10:16 AM 07/18/2021   10:08 AM 05/23/2020    9:54 PM 06/29/2018   10:50 AM 04/28/2018    1:59 PM  Advanced Directives  Does Patient Have a Medical Advance Directive? Yes Yes Yes Yes No Yes No  Type of Estate agentAdvance Directive Healthcare Power of [place]orney;Living will Healthcare Power of [place];Living will Healthcare Power of [place]orney;Living will Healthcare Power of [place] In[place]orney  Healthcare Power of [place];Living will   Does patient want to make changes to medical advance directive? No - Patient declined  No - Patient declined      Copy of Healthcare Power of Attorney in Chart? Yes - validated most recent copy scanned in chart (See row information)  Yes - validated most recent copy scanned in chart (See row information) Yes - validated most recent copy scanned in chart (See row information)  No - copy requested   Would patient like information on creating a medical advance directive?     No - Patient declined  No - Patient declined    Current Medications (verified) Outpatient Encounter Medications as of 07/29/2022  Medication Sig   albuterol (VENTOLIN HFA) 108 (90 Base) MCG/ACT inhaler Inhale 1-2 puffs into the lungs every 6 (six) hours as needed  for wheezing or shortness of breath.   buPROPion (WELLBUTRIN XL) 300 MG 24 hr tablet TAKE 1 TABLET BY MOUTH DAILY   EPINEPHrine 0.3 mg/0.3 mL IJ SOAJ injection Inject 0.3 mg into the muscle as needed for anaphylaxis.   fluticasone (FLONASE) 50 MCG/ACT nasal spray Place 2 sprays into both nostrils at bedtime.   hydroxypropyl methylcellulose / hypromellose (ISOPTO TEARS / GONIOVISC) 2.5 % ophthalmic solution Place 1 drop into both eyes as needed for dry eyes.   ipratropium (ATROVENT) 0.03 % nasal spray Place 2 sprays into both nostrils daily.   Iron-Vitamin C 65-125 MG TABS Take 1 tablet by mouth every evening.   levocetirizine (XYZAL) 5 MG tablet Take 5 mg by mouth every evening.   Multiple Vitamin (MULTIVITAMIN) tablet Take 1 tablet by mouth daily.   rosuvastatin (CRESTOR) 5 MG tablet TAKE 1 TABLET BY MOUTH DAILY   temazepam (RESTORIL) 30 MG capsule Take 1 capsule (30 mg total) by mouth at bedtime.   [DISCONTINUED] COVID-19 mRNA vaccine 2023-2024 (COMIRNATY) syringe Inject into the muscle.   Facility-Administered Encounter Medications as of 07/29/2022  Medication   0.9 %  sodium chloride infusion    Allergies (verified) Penicillins   History: Past Medical History:  Diagnosis Date   AC (acromioclavicular) joint bone spurs    left shoulder with chronic pain, w/limitation ROM   Allergy    weekly allergy shots   Anemia    hx of   Anxiety  Arthritis    Asthma    Depression    Fibrocystic breast disease    GERD (gastroesophageal reflux disease)    OSA (obstructive sleep apnea) 07/31/2013   Peptic ulcer disease    TMJ locking    Past Surgical History:  Procedure Laterality Date   ABDOMINAL HYSTERECTOMY  2003   due to fibroids   ADENOIDECTOMY     x3   BICEPT TENODESIS Left 03/25/2022   Procedure: BICEPS TENODESIS;  Surgeon: [name], MD;  Location: [facility] OR;  Service: Orthopedics;  Laterality: Left;   BIOPSY  06/29/2018   Procedure: BIOPSY;  Surgeon: Shellia Cleverly, DO;  Location: [facility];  Service: Gastroenterology;;   BREAST CYST ASPIRATION     COLONOSCOPY  03/10/2007   results-normal   COLONOSCOPY WITH PROPOFOL N/A 06/29/2018   Procedure: COLONOSCOPY WITH PROPOFOL;  Surgeon: Shellia Cleverly, DO;  Location: [facility];  Service: Gastroenterology;  Laterality: N/A;   DILATION AND CURETTAGE OF UTERUS     HAMMER TOE SURGERY     POLYPECTOMY  06/29/2018   Procedure: POLYPECTOMY;  Surgeon: Shellia Cleverly, DO;  Location: [facility];  Service: Gastroenterology;;   PUBOVAGINAL SLING  '90's   for incontinence   RHINOPLASTY  1984   ROTATOR CUFF REPAIR Right 2013   [place]   ROTATOR CUFF REPAIR Left 2012   [place]   SHOULDER ARTHROSCOPY WITH ROTATOR CUFF REPAIR AND SUBACROMIAL DECOMPRESSION Left 03/25/2022   Procedure: LEFT SHOULDER ARTHROSCOPY, SUBACROMIAL DECOMPRESSION, DEBRIDEMENT, MINI OPEN ROTATOR CUFF TEAR REPAIR;  Surgeon: [name], MD;  Location: [facility] OR;  Service: Orthopedics;  Laterality: Left;   TONSILLECTOMY     x2   TUBAL LIGATION     TURBINATE REDUCTION  1997   WRIST SURGERY     Family History  Problem Relation Age of Onset   Osteoporosis Mother    Fibroids Mother    Other Mother        aortic valve replacement 1994, abdominal aortic aneurysm   Lung cancer Mother 68   Heart disease Father    Testicular cancer Father 70   Prostate cancer Father 53   Leukemia Father        CLL   Diabetes Maternal Grandmother    Heart disease Maternal Grandfather    Heart disease Paternal Grandmother    Diabetes Paternal Grandfather    Stomach cancer Paternal Grandfather    Breast cancer Paternal Aunt    Breast cancer Cousin    Colon cancer Neg Hx    Throat cancer Neg Hx    Pancreatic cancer Neg Hx    Social History   Socioeconomic History   Marital status: Married    Spouse name: Not on file   Number of children: 2   Years of education: Not on file   Highest education level: Not on file  Occupational  History   Not on file  Tobacco Use   Smoking status: Former    Packs/day: 2.00    Years: 15.00    Additional pack years: 0.00    Total pack years: 30.00    Types: Cigarettes    Quit date: 11/19/1989    Years since quitting: 32.7   Smokeless tobacco: Never  Vaping Use   Vaping Use: Never used  Substance and Sexual Activity   Alcohol use: Yes    Comment: maybe a glass of wine on the weekend   Drug use: No   Sexual activity: Not on file  Other Topics  Concern   Not on file  Social History Narrative   [employer] 2 years   Marries '822 daughters '19 '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    Social Determinants of Corporate investment banker Strain: Low Risk  (07/29/2022)   Overall Financial Resource Strain (CARDIA)    Difficulty of Paying Living Expenses: Not hard at all  Food Insecurity: No Food Insecurity (07/29/2022)   Hunger Vital Sign    Worried About Running Out of Food in the Last Year: Never true    Ran Out of Food in the Last Year: Never true  Transportation Needs: No Transportation Needs (07/29/2022)   PRAPARE - Administrator, Civil Service (Medical): No    Lack of Transportation (Non-Medical): No  Physical Activity: Unknown (07/29/2022)   Exercise Vital Sign    Days of Exercise per Week: 4 days    Minutes of Exercise per Session: Not on file  Stress: Stress Concern Present (07/29/2022)   [employer] of Occupational Health - Occupational Stress Questionnaire    Feeling of Stress : To some extent  Social Connections: Socially Integrated (07/29/2022)   Social Connection and Isolation Panel [NHANES]    Frequency of Communication with Friends and Family: More than three times a week    Frequency of Social Gatherings with Friends and Family: More than three times a week    Attends Religious Services: 1 to 4 times per year    Active Member of [employer] or Organizations: Yes    Attends Banker Meetings: 1 to 4 times per year    Marital  Status: Married    Tobacco Counseling Counseling given: Not Answered   Clinical Intake:  Pre-visit preparation completed: Yes  Pain : No/denies pain     BMI - recorded: 27.06 Nutritional Status: BMI 25 -29 Overweight Nutritional Risks: None Diabetes: No  How often do you need to have someone help you when you read instructions, pamphlets, or other written materials from your doctor or pharmacy?: 1 - Never  Diabetic?no  Interpreter Needed?: No  Information entered by :: Lanier Ensign, LPN   Activities of Daily Living    07/29/2022    9:09 AM 07/17/2022   12:46 PM  In your present state of health, do you have any difficulty performing the following activities:  Hearing? 1 0  Comment slight hearing ;loss   Vision? 0 0  Difficulty concentrating or making decisions? 0 0  Walking or climbing stairs? 0 0  Dressing or bathing? 0 0  Doing errands, shopping? 0 0  Preparing Food and eating ? N N  Using the Toilet? N N  In the past six months, have you accidently leaked urine? N Y  Do you have problems with loss of bowel control? N N  Managing your Medications? N N  Managing your Finances? N N  Housekeeping or managing your Housekeeping? N N    Patient Care Team: Jarold Motto, [place] as PCP - General (Physician Assistant) Jodelle Red, MD as PCP - Cardiology (Cardiology) Shea Evans, MD as Attending Physician (Obstetrics and Gynecology) Valeria Batman, MD (Inactive) (Orthopedic Surgery) Irena Cords, Enzo Montgomery, MD (Allergy and Immunology) Archer Asa, MD (Psychiatry)  Indicate any recent Medical Services you may have received from other than [facility] providers in the past year (date may be approximate).     Assessment:   This is a routine wellness examination for Jayah.  Hearing/Vision screen Hearing Screening - Comments:: Pt stated slight hearing loss  Vision Screening - Comments:: Pt follows up with Dr Sinda Du for annual eye exams    Dietary issues and exercise activities discussed: Current Exercise Habits: Home exercise routine, Type of exercise: Other - see comments (water aerobics and 1 day line dancing), Time (Minutes): 60, Frequency (Times/Week): 4, Weekly Exercise (Minutes/Week): 240   Goals Addressed             This Visit's Progress    Patient Stated       None at this time        Depression Screen    07/29/2022    9:05 AM 07/18/2021   10:06 AM 05/21/2021   11:09 AM 05/17/2020   12:44 PM 02/09/2019   11:06 AM 11/05/2018    1:21 PM 04/23/2017    9:21 AM  PHQ 2/9 Scores  PHQ - 2 Score 3 0 4 3 0 1 1  PHQ- 9 Score 5  7 13 3 6 5     Fall Risk    07/29/2022    9:08 AM 07/17/2022   12:46 PM 07/18/2021   10:08 AM 05/21/2021   11:09 AM 05/17/2020    8:02 AM  Fall Risk   Falls in the past year? 0 0 0 0 0  Number falls in past yr: 0 0 0 0   Injury with Fall? 0 0 0 0   Risk for fall due to : Impaired vision;Impaired mobility  Impaired vision  No Fall Risks  Risk for fall due to: Comment related to shoulder      Follow up Falls prevention discussed  Falls prevention discussed Falls evaluation completed     FALL RISK PREVENTION PERTAINING TO THE HOME:  Any stairs in or around the home? Yes  If so, are there any without handrails? No  Home free of loose throw rugs in walkways, pet beds, electrical cords, etc? Yes  Adequate lighting in your home to reduce risk of falls? Yes   ASSISTIVE DEVICES UTILIZED TO PREVENT FALLS:  Life alert? No  Use of a cane, walker or w/c? No  Grab bars in the bathroom? Yes  Shower chair or bench in shower? No  Elevated toilet seat or a handicapped toilet? No   TIMED UP AND GO:  Was the test performed? No .   Cognitive Function:        07/29/2022    9:11 AM 07/18/2021   10:10 AM  6CIT Screen  What Year? 0 points 0 points  What month? 0 points 0 points  What time? 0 points 0 points  Count back from 20 0 points 0 points  Months in reverse 0 points 0 points  Repeat  phrase 0 points 0 points  Total Score 0 points 0 points    Immunizations Immunization History  Administered Date(s) Administered   COVID-19, mRNA, vaccine(Comirnaty)12 years and older 05/15/2022   Fluad Quad(high Dose 65+) 01/11/2020, 01/29/2021, 11/26/2021   Influenza Whole 01/20/2008, 02/02/2009, 01/10/2010   Influenza, High Dose Seasonal PF 02/28/2016, 02/18/2017, 02/24/2018, 12/24/2018, 06/20/2019, 06/19/2020   Influenza,inj,Quad PF,6+ Mos 01/06/2015, 01/08/2016, 01/19/2017   Influenza,inj,quad, With Preservative 01/06/2015   Influenza-Unspecified 01/20/2012, 01/06/2013, 01/16/2014, 01/01/2018, 12/24/2018   PFIZER(Purple Top)SARS-COV-2 Vaccination 05/12/2019, 06/02/2019, 12/13/2019, 08/08/2020   Pfizer Covid-19 Vaccine Bivalent Booster 22yrs & up 04/02/2021, 11/26/2021   Pneumococcal Conjugate-13 11/05/2018   Pneumococcal Polysaccharide-23 07/29/2013, 02/28/2016, 02/18/2017, 02/24/2018, 06/20/2019, 01/11/2020, 06/19/2020   Respiratory Syncytial Virus Vaccine,Recomb Aduvanted(Arexvy) 01/09/2022   Td 03/26/2009   Tdap 07/25/2016   Zoster Recombinat (Shingrix) 12/04/2016,  03/16/2017   Zoster, Live 08/11/2008    TDAP status: Up to date  Flu Vaccine status: Up to date  Pneumococcal vaccine status: Up to date  Covid-19 vaccine status: Completed vaccines  Qualifies for Shingles Vaccine? Yes   Zostavax completed Yes   Shingrix Completed?: Yes  Screening Tests Health Maintenance  Topic Date Due   COVID-19 Vaccine (8 - 2023-24 season) 07/10/2022   INFLUENZA VACCINE  11/20/2022   Medicare Annual Wellness (AWV)  07/29/2023   MAMMOGRAM  06/18/2024   DTaP/Tdap/Td (3 - Td or Tdap) 07/26/2026   COLONOSCOPY (Pts 45-66yrs Insurance coverage will need to be confirmed)  06/28/2028   Pneumonia Vaccine 52+ Years old  Completed   DEXA SCAN  Completed   Hepatitis C Screening  Completed   Zoster Vaccines- Shingrix  Completed   HPV VACCINES  Aged Out    Health Maintenance  Health  Maintenance Due  Topic Date Due   COVID-19 Vaccine (8 - 2023-24 season) 07/10/2022    Colorectal cancer screening: Type of screening: Colonoscopy. Completed 06/29/18. Repeat every 10 years  Mammogram status: Completed 06/19/22. Repeat every year  Bone Density status: Completed 01/28/19. Results reflect: Bone density results: OSTEOPENIA. Repeat every 2 years.   Additional Screening:  Hepatitis C Screening:  Completed 10/24/16  Vision Screening: Recommended annual ophthalmology exams for early detection of glaucoma and other disorders of the eye. Is the patient up to date with their annual eye exam?  Yes  Who is the provider or what is the name of the office in which the patient attends annual eye exams? Dr Sinda Du  If pt is not established with a provider, would they like to be referred to a provider to establish care? No .   Dental Screening: Recommended annual dental exams for proper oral hygiene  Community Resource Referral / Chronic Care Management: CRR required this visit?  No   CCM required this visit?  No      Plan:     I have personally reviewed and noted the following in the patient's chart:   Medical and social history Use of alcohol, tobacco or illicit drugs  Current medications and supplements including opioid prescriptions. Patient is not currently taking opioid prescriptions. Functional ability and status Nutritional status Physical activity Advanced directives List of other physicians Hospitalizations, surgeries, and ER visits in previous 12 months Vitals Screenings to include cognitive, depression, and falls Referrals and appointments  In addition, I have reviewed and discussed with patient certain preventive protocols, quality metrics, and best practice recommendations. A written personalized care plan for preventive services as well as general preventive health recommendations were provided to patient.     Marzella Schlein, LPN   08/24/7012    Nurse Notes: none

## 2022-07-29 NOTE — Patient Instructions (Signed)
Chelsey Jackson , Thank you for taking time to come for your Medicare Wellness Visit. I appreciate your ongoing commitment to your health goals. Please review the following plan we discussed and let me know if I can assist you in the future.   These are the goals we discussed:  Goals      Patient Stated     None at this time      Patient Stated     None at this time         This is a list of the screening recommended for you and due dates:  Health Maintenance  Topic Date Due   COVID-19 Vaccine (8 - 2023-24 season) 07/10/2022   Flu Shot  11/20/2022   Medicare Annual Wellness Visit  07/29/2023   Mammogram  06/18/2024   DTaP/Tdap/Td vaccine (3 - Td or Tdap) 07/26/2026   Colon Cancer Screening  06/28/2028   Pneumonia Vaccine  Completed   DEXA scan (bone density measurement)  Completed   Hepatitis C Screening: USPSTF Recommendation to screen - Ages 19-79 yo.  Completed   Zoster (Shingles) Vaccine  Completed   HPV Vaccine  Aged Out    Advanced directives: copies in chart   Conditions/risks identified: none at this time   Next appointment: Follow up in one year for your annual wellness visit    Preventive Care 65 Years and Older, Female Preventive care refers to lifestyle choices and visits with your health care provider that can promote health and wellness. What does preventive care include? A yearly physical exam. This is also called an annual well check. Dental exams once or twice a year. Routine eye exams. Ask your health care provider how often you should have your eyes checked. Personal lifestyle choices, including: Daily care of your teeth and gums. Regular physical activity. Eating a healthy diet. Avoiding tobacco and drug use. Limiting alcohol use. Practicing safe sex. Taking low-dose aspirin every day. Taking vitamin and mineral supplements as recommended by your health care provider. What happens during an annual well check? The services and screenings done by  your health care provider during your annual well check will depend on your age, overall health, lifestyle risk factors, and family history of disease. Counseling  Your health care provider may ask you questions about your: Alcohol use. Tobacco use. Drug use. Emotional well-being. Home and relationship well-being. Sexual activity. Eating habits. History of falls. Memory and ability to understand (cognition). Work and work Astronomer. Reproductive health. Screening  You may have the following tests or measurements: Height, weight, and BMI. Blood pressure. Lipid and cholesterol levels. These may be checked every 5 years, or more frequently if you are over 51 years old. Skin check. Lung cancer screening. You may have this screening every year starting at age 11 if you have a 30-pack-year history of smoking and currently smoke or have quit within the past 15 years. Fecal occult blood test (FOBT) of the stool. You may have this test every year starting at age 46. Flexible sigmoidoscopy or colonoscopy. You may have a sigmoidoscopy every 5 years or a colonoscopy every 10 years starting at age 51. Hepatitis C blood test. Hepatitis B blood test. Sexually transmitted disease (STD) testing. Diabetes screening. This is done by checking your blood sugar (glucose) after you have not eaten for a while (fasting). You may have this done every 1-3 years. Bone density scan. This is done to screen for osteoporosis. You may have this done starting at age 35. Mammogram.  This may be done every 1-2 years. Talk to your health care provider about how often you should have regular mammograms. Talk with your health care provider about your test results, treatment options, and if necessary, the need for more tests. Vaccines  Your health care provider may recommend certain vaccines, such as: Influenza vaccine. This is recommended every year. Tetanus, diphtheria, and acellular pertussis (Tdap, Td) vaccine. You  may need a Td booster every 10 years. Zoster vaccine. You may need this after age 52. Pneumococcal 13-valent conjugate (PCV13) vaccine. One dose is recommended after age 9. Pneumococcal polysaccharide (PPSV23) vaccine. One dose is recommended after age 25. Talk to your health care provider about which screenings and vaccines you need and how often you need them. This information is not intended to replace advice given to you by your health care provider. Make sure you discuss any questions you have with your health care provider. Document Released: 05/04/2015 Document Revised: 12/26/2015 Document Reviewed: 02/06/2015 Elsevier Interactive Patient Education  2017 [place] Prevention in the Home Falls can cause injuries. They can happen to people of all ages. There are many things you can do to make your home safe and to help prevent falls. What can I do on the outside of my home? Regularly fix the edges of walkways and driveways and fix any cracks. Remove anything that might make you trip as you walk through a door, such as a raised step or threshold. Trim any bushes or trees on the path to your home. Use bright outdoor lighting. Clear any walking paths of anything that might make someone trip, such as rocks or tools. Regularly check to see if handrails are loose or broken. Make sure that both sides of any steps have handrails. Any raised decks and porches should have guardrails on the edges. Have any leaves, snow, or ice cleared regularly. Use sand or salt on walking paths during winter. Clean up any spills in your garage right away. This includes oil or grease spills. What can I do in the bathroom? Use night lights. Install grab bars by the toilet and in the tub and shower. Do not use towel bars as grab bars. Use non-skid mats or decals in the tub or shower. If you need to sit down in the shower, use a plastic, non-slip stool. Keep the floor dry. Clean up any water that spills  on the floor as soon as it happens. Remove soap buildup in the tub or shower regularly. Attach bath mats securely with double-sided non-slip rug tape. Do not have throw rugs and other things on the floor that can make you trip. What can I do in the bedroom? Use night lights. Make sure that you have a light by your bed that is easy to reach. Do not use any sheets or blankets that are too big for your bed. They should not hang down onto the floor. Have a firm chair that has side arms. You can use this for support while you get dressed. Do not have throw rugs and other things on the floor that can make you trip. What can I do in the kitchen? Clean up any spills right away. Avoid walking on wet floors. Keep items that you use a lot in easy-to-reach places. If you need to reach something above you, use a strong step stool that has a grab bar. Keep electrical cords out of the way. Do not use floor polish or wax that makes floors slippery. If you  must use wax, use non-skid floor wax. Do not have throw rugs and other things on the floor that can make you trip. What can I do with my stairs? Do not leave any items on the stairs. Make sure that there are handrails on both sides of the stairs and use them. Fix handrails that are broken or loose. Make sure that handrails are as long as the stairways. Check any carpeting to make sure that it is firmly attached to the stairs. Fix any carpet that is loose or worn. Avoid having throw rugs at the top or bottom of the stairs. If you do have throw rugs, attach them to the floor with carpet tape. Make sure that you have a light switch at the top of the stairs and the bottom of the stairs. If you do not have them, ask someone to add them for you. What else can I do to help prevent falls? Wear shoes that: Do not have high heels. Have rubber bottoms. Are comfortable and fit you well. Are closed at the toe. Do not wear sandals. If you use a stepladder: Make  sure that it is fully opened. Do not climb a closed stepladder. Make sure that both sides of the stepladder are locked into place. Ask someone to hold it for you, if possible. Clearly mark and make sure that you can see: Any grab bars or handrails. First and last steps. Where the edge of each step is. Use tools that help you move around (mobility aids) if they are needed. These include: Canes. Walkers. Scooters. Crutches. Turn on the lights when you go into a dark area. Replace any light bulbs as soon as they burn out. Set up your furniture so you have a clear path. Avoid moving your furniture around. If any of your floors are uneven, fix them. If there are any pets around you, be aware of where they are. Review your medicines with your doctor. Some medicines can make you feel dizzy. This can increase your chance of falling. Ask your doctor what other things that you can do to help prevent falls. This information is not intended to replace advice given to you by your health care provider. Make sure you discuss any questions you have with your health care provider. Document Released: 02/01/2009 Document Revised: 09/13/2015 Document Reviewed: 05/12/2014 Elsevier Interactive Patient Education  2017 [employer].

## 2022-08-14 ENCOUNTER — Ambulatory Visit
Admission: RE | Admit: 2022-08-14 | Discharge: 2022-08-14 | Disposition: A | Discharge status: Home / Self Care | Payer: Medicare Other | Source: Ambulatory Visit | Attending: Orthopedic Surgery | Admitting: Orthopedic Surgery

## 2022-08-14 DIAGNOSIS — G8929 Other chronic pain: Secondary | ICD-10-CM

## 2022-08-19 ENCOUNTER — Encounter: Payer: Self-pay | Admitting: Orthopedic Surgery

## 2022-08-19 ENCOUNTER — Other Ambulatory Visit: Payer: Self-pay | Admitting: Physician Assistant

## 2022-08-21 NOTE — Telephone Encounter (Signed)
Patient called for an update. States she is set to go out of town tomorrow and is fully out.

## 2022-08-21 NOTE — Telephone Encounter (Signed)
Pt sent message again.

## 2022-08-21 NOTE — Telephone Encounter (Signed)
Pt requesting refill for Temazepam 30 mg. Last OV 02/20/22.

## 2022-08-26 ENCOUNTER — Ambulatory Visit (INDEPENDENT_AMBULATORY_CARE_PROVIDER_SITE_OTHER): Payer: Medicare Other | Admitting: Physician Assistant

## 2022-08-26 ENCOUNTER — Encounter: Payer: Self-pay | Admitting: Physician Assistant

## 2022-08-26 VITALS — BP 138/80 | HR 73 | Temp 97.8°F | Ht 68.0 in | Wt 185.5 lb

## 2022-08-26 DIAGNOSIS — F32A Depression, unspecified: Secondary | ICD-10-CM | POA: Diagnosis not present

## 2022-08-26 DIAGNOSIS — R300 Dysuria: Secondary | ICD-10-CM | POA: Diagnosis not present

## 2022-08-26 DIAGNOSIS — F419 Anxiety disorder, unspecified: Secondary | ICD-10-CM | POA: Diagnosis not present

## 2022-08-26 LAB — POCT URINALYSIS DIPSTICK
Bilirubin, UA: NEGATIVE
Blood, UA: NEGATIVE
Glucose, UA: NEGATIVE
Ketones, UA: NEGATIVE
Leukocytes, UA: NEGATIVE
Nitrite, UA: NEGATIVE
Protein, UA: NEGATIVE
Spec Grav, UA: 1.01 (ref 1.010–1.025)
Urobilinogen, UA: 0.2 E.U./dL
pH, UA: 6 (ref 5.0–8.0)

## 2022-08-26 NOTE — Progress Notes (Signed)
Chelsey Jackson is a 72 y.o. female here for a follow up of a pre-existing problem.  History of Present Illness:   Chief Complaint  Patient presents with   Dysuria    Pt c/o burning with urination on Saturday, took Azo 1 dose Sat & Sun, burning has subsided but is having urgency. Denies back pain, no fever or chills.     Depression and Anxiety Felt like her mood dipped about 5 weeks ago Has a lot of family stress Lost interest with hobbies She has been in touch with providers who did her TMSprocedure last year and she is planning to do this again Denies SI/HI Continues on Wellbutrin 300 mg daily  Dysuria Random shooting pain up near her urethra started over the weekend while traveling Took Azo x 2 and had a lot of water Symptom(s) are currently gone Denies: fevers, chills, vaginal discharge, back pain, nausea and vomiting   Past Medical History:  Diagnosis Date   AC (acromioclavicular) joint bone spurs    left shoulder with chronic pain, w/limitation ROM   Allergy    weekly allergy shots   Anemia    hx of   Anxiety    Arthritis    Asthma    Depression    Fibrocystic breast disease    GERD (gastroesophageal reflux disease)    OSA (obstructive sleep apnea) 07/31/2013   Peptic ulcer disease    TMJ locking      Social History   Tobacco Use   Smoking status: Former    Packs/day: 2.00    Years: 15.00    Additional pack years: 0.00    Total pack years: 30.00    Types: Cigarettes    Quit date: 11/19/1989    Years since quitting: 32.7   Smokeless tobacco: Never  Vaping Use   Vaping Use: Never used  Substance Use Topics   Alcohol use: Yes    Comment: maybe a glass of wine on the weekend   Drug use: No    Past Surgical History:  Procedure Laterality Date   ABDOMINAL HYSTERECTOMY  2003   due to fibroids   ADENOIDECTOMY     x3   BICEPT TENODESIS Left 03/25/2022   Procedure: BICEPS TENODESIS;  Surgeon: [name], MD;  Location: [facility];  Service:  Orthopedics;  Laterality: Left;   BIOPSY  06/29/2018   Procedure: BIOPSY;  Surgeon: Shellia Cleverly, DO;  Location: [facility];  Service: Gastroenterology;;   BREAST CYST ASPIRATION     COLONOSCOPY  03/10/2007   results-normal   COLONOSCOPY WITH PROPOFOL N/A 06/29/2018   Procedure: COLONOSCOPY WITH PROPOFOL;  Surgeon: Shellia Cleverly, DO;  Location: [facility];  Service: Gastroenterology;  Laterality: N/A;   DILATION AND CURETTAGE OF UTERUS     HAMMER TOE SURGERY     POLYPECTOMY  06/29/2018   Procedure: POLYPECTOMY;  Surgeon: Shellia Cleverly, DO;  Location: [facility];  Service: Gastroenterology;;   PUBOVAGINAL SLING  '90's   for incontinence   RHINOPLASTY  1984   ROTATOR CUFF REPAIR Right 2013   [place]   ROTATOR CUFF REPAIR Left 2012   [place]   SHOULDER ARTHROSCOPY WITH ROTATOR CUFF REPAIR AND SUBACROMIAL DECOMPRESSION Left 03/25/2022   Procedure: LEFT SHOULDER ARTHROSCOPY, SUBACROMIAL DECOMPRESSION, DEBRIDEMENT, MINI OPEN ROTATOR CUFF TEAR REPAIR;  Surgeon: [name], MD;  Location: [facility];  Service: Orthopedics;  Laterality: Left;   TONSILLECTOMY     x2   TUBAL LIGATION  TURBINATE REDUCTION  1997   WRIST SURGERY      Family History  Problem Relation Age of Onset   Osteoporosis Mother    Fibroids Mother    Other Mother        aortic valve replacement 1994, abdominal aortic aneurysm   Lung cancer Mother 65   Heart disease Father    Testicular cancer Father 51   Prostate cancer Father 58   Leukemia Father        CLL   Diabetes Maternal Grandmother    Heart disease Maternal Grandfather    Heart disease Paternal Grandmother    Diabetes Paternal Grandfather    Stomach cancer Paternal Grandfather    Breast cancer Paternal Aunt    Breast cancer Cousin    Colon cancer Neg Hx    Throat cancer Neg Hx    Pancreatic cancer Neg Hx     Allergies  Allergen Reactions   Penicillins Anaphylaxis    REACTION: stop breathing    Current  Medications:   Current Outpatient Medications:    albuterol (VENTOLIN HFA) 108 (90 Base) MCG/ACT inhaler, Inhale 1-2 puffs into the lungs every 6 (six) hours as needed for wheezing or shortness of breath., Disp: 18 g, Rfl: 2   buPROPion (WELLBUTRIN XL) 300 MG 24 hr tablet, TAKE 1 TABLET BY MOUTH DAILY, Disp: 90 tablet, Rfl: 0   EPINEPHrine 0.3 mg/0.3 mL IJ SOAJ injection, Inject 0.3 mg into the muscle as needed for anaphylaxis., Disp: , Rfl:    fluticasone (FLONASE) 50 MCG/ACT nasal spray, Place 2 sprays into both nostrils at bedtime., Disp: , Rfl:    hydroxypropyl methylcellulose / hypromellose (ISOPTO TEARS / GONIOVISC) 2.5 % ophthalmic solution, Place 1 drop into both eyes as needed for dry eyes., Disp: , Rfl:    ipratropium (ATROVENT) 0.03 % nasal spray, Place 2 sprays into both nostrils daily., Disp: , Rfl:    Iron-Vitamin C 65-125 MG TABS, Take 1 tablet by mouth every evening., Disp: , Rfl:    levocetirizine (XYZAL) 5 MG tablet, Take 5 mg by mouth every evening., Disp: , Rfl:    Multiple Vitamin (MULTIVITAMIN) tablet, Take 1 tablet by mouth daily., Disp: , Rfl:    rosuvastatin (CRESTOR) 5 MG tablet, TAKE 1 TABLET BY MOUTH DAILY, Disp: 90 tablet, Rfl: 1   temazepam (RESTORIL) 30 MG capsule, TAKE 1 CAPSULE BY MOUTH AT BEDTIME, Disp: 90 capsule, Rfl: 0  Current Facility-Administered Medications:    0.9 %  sodium chloride infusion, 500 mL, Intravenous, Once, Tressia Danas, MD   Review of Systems:   Review of Systems  Genitourinary:  Positive for dysuria.   Negative unless otherwise specified per HPI.  Vitals:   Vitals:   08/26/22 1134  BP: 138/80  Pulse: 73  Temp: 97.8 F (36.6 C)  TempSrc: Temporal  SpO2: 96%  Weight: 185 lb 8 oz (84.1 kg)  Height: 5\' 8"  (1.727 m)     Body mass index is 28.21 kg/m.  Physical Exam:   Physical Exam Vitals and nursing note reviewed.  Constitutional:      General: She is not in acute distress.    Appearance: She is well-developed.  She is not ill-appearing or toxic-appearing.  Cardiovascular:     Rate and Rhythm: Normal rate and regular rhythm.     Pulses: Normal pulses.     Heart sounds: Normal heart sounds, S1 normal and S2 normal.  Pulmonary:     Effort: Pulmonary effort is normal.  Breath sounds: Normal breath sounds.  Abdominal:     Tenderness: There is no right CVA tenderness or left CVA tenderness.  Skin:    General: Skin is warm and dry.  Neurological:     Mental Status: She is alert.     GCS: GCS eye subscore is 4. GCS verbal subscore is 5. GCS motor subscore is 6.  Psychiatric:        Speech: Speech normal.        Behavior: Behavior normal. Behavior is cooperative.    Results for orders placed or performed in visit on 08/26/22  POCT urinalysis dipstick  Result Value Ref Range   Color, UA yellow    Clarity, UA clear    Glucose, UA Negative Negative   Bilirubin, UA Negative    Ketones, UA Negative    Spec Grav, UA 1.010 1.010 - 1.025   Blood, UA Negative    pH, UA 6.0 5.0 - 8.0   Protein, UA Negative Negative   Urobilinogen, UA 0.2 0.2 or 1.0 E.U./dL   Nitrite, UA Negative    Leukocytes, UA Negative Negative   Appearance     Odor      Assessment and Plan:   Dysuria No red flags Symptom(s) resolved and UA WNL Recommend continued hydration, stop azo and order urine culture If new/worsening symptom(s), recommend close follow-up  Anxiety and depression Overall uncontrolled but improving from 5 weeks ago Proceeding with TMStherapy Continue Wellbutrin 300 mg daily I discussed with patient that if they develop any SI, to tell someone immediately and seek medical attention. Follow-up with [place] after TMStherapy, sooner if concerns  Jarold Motto, PA-C

## 2022-08-26 NOTE — Patient Instructions (Signed)
It was great to see you!  I will be in touch with your urine results If symptoms change - let me know in the meantime       Take care,  Jarold Motto PA-C

## 2022-08-27 LAB — URINE CULTURE
MICRO NUMBER:: 14923995
SPECIMEN QUALITY:: ADEQUATE

## 2022-09-03 ENCOUNTER — Ambulatory Visit (INDEPENDENT_AMBULATORY_CARE_PROVIDER_SITE_OTHER): Payer: Medicare Other | Admitting: Orthopedic Surgery

## 2022-09-03 ENCOUNTER — Other Ambulatory Visit: Payer: Self-pay

## 2022-09-03 ENCOUNTER — Encounter: Payer: Self-pay | Admitting: Orthopedic Surgery

## 2022-09-03 DIAGNOSIS — G8929 Other chronic pain: Secondary | ICD-10-CM

## 2022-09-03 DIAGNOSIS — M25412 Effusion, left shoulder: Secondary | ICD-10-CM

## 2022-09-03 DIAGNOSIS — M25512 Pain in left shoulder: Secondary | ICD-10-CM

## 2022-09-03 MED ORDER — GABAPENTIN 100 MG PO CAPS: ORAL_CAPSULE | ORAL | 0 refills | Status: DC

## 2022-09-03 MED ORDER — METHYLPREDNISOLONE ACETATE 40 MG/ML IJ SUSP
40.0000 mg | INTRAMUSCULAR | Status: AC | PRN
  Administered 2022-09-03: 40 mg via INTRA_ARTICULAR

## 2022-09-03 MED ORDER — LIDOCAINE HCL 1 % IJ SOLN
5.0000 mL | INTRAMUSCULAR | Status: AC | PRN
  Administered 2022-09-03: 5 mL

## 2022-09-03 MED ORDER — BUPIVACAINE HCL 0.5 % IJ SOLN
9.0000 mL | INTRAMUSCULAR | Status: AC | PRN
  Administered 2022-09-03: 9 mL via INTRA_ARTICULAR

## 2022-09-03 NOTE — Progress Notes (Signed)
Office Visit Note   Patient: Chelsey Jackson           Date of Birth: 1950-07-16           MRN: [id number] Visit Date: 09/03/2022 Requested by: Jarold Motto, [place] [address] [place],  [place] [address] PCP: Jarold Motto, [place]  Subjective: Chief Complaint  Patient presents with   Other     Scan review    HPI: Chelsey Jackson is a 72 y.o. female who presents to the office reporting continued posterior left proximal humerus pain.  Since she was last seen she had an MRI scan of that humerus.  No real findings localizing to her area of maximal tenderness around the posterior aspect of the deltoid.  That would be a distal attachment site.  Does not really have any pain sleeping but has a lot of pain when she is awake.  Has some shoulder pain localizing to the deltoid region but most of her pain is focal and in that distal deltoid attachment area.  Overall her symptoms are staying the same.  Started after surgery when she was doing physical therapy.  The shoulder pain appears distinct and different from the posterior deltoid pain.  Still hard for her to go overhead.  Both areas are painful with water aerobics with resistance.  MRI scan shows partial-thickness tearing of the rotator cuff repair along with shoulder joint effusion.  This is not a dedicated shoulder scan so it is really hard to base too much on a somewhat fuzzy view of the rotator cuff..                ROS: All systems reviewed are negative as they relate to the chief complaint within the history of present illness.  Patient denies fevers or chills.  Assessment & Plan: Visit Diagnoses:  1. Chronic left shoulder pain     Plan: Impression is left deltoid pain.  She has had cervical spine injection which did not help at all.  MRI scan shows no real structural problem in her area of maximal tenderness.  I think a true and hard finding of the study is the presence of some effusion in the joint.  She is not having any fevers  or chills.  I think a diagnostic and therapeutic injection into the glenohumeral joint could be helpful.  Also will try Neurontin for 7 days.  3-week return for clinical recheck and assessment of further options.  May consider nerve conduction study or repeat cervical spine injection.  I do not think this partial-thickness rotator cuff tear is really accounting for her constant relatively unrelenting pain in that posterior distal deltoid region.  Follow-Up Instructions: No follow-ups on file.   Orders:  Orders Placed This Encounter  Procedures   US Guided Needle Placement - No Linked Charges   Meds ordered this encounter  Medications   gabapentin (NEURONTIN) 100 MG capsule    Sig: 1 po bid x 7 days    Dispense:  30 capsule    Refill:  0      Procedures: Large Joint Inj: L glenohumeral on 09/03/2022 7:34 PM Indications: diagnostic evaluation and pain Details: 18 G 1.5 in needle, posterior approach  Arthrogram: No  Medications: 9 mL bupivacaine 0.5 %; 40 mg methylPREDNISolone acetate 40 MG/ML; 5 mL lidocaine 1 % Outcome: tolerated well, no immediate complications Procedure, treatment alternatives, risks and benefits explained, specific risks discussed. Consent was given by the patient. Immediately prior to procedure a  time out was called to verify the correct patient, procedure, equipment, support staff and site/side marked as required. Patient was prepped and draped in the usual sterile fashion.       Clinical Data: No additional findings.  Objective: Vital Signs: LMP  (LMP Unknown)   Physical Exam:  Constitutional: Patient appears well-developed HEENT:  Head: Normocephalic Eyes:EOM are normal Neck: Normal range of motion Cardiovascular: Normal rate Pulmonary/chest: Effort normal Neurologic: Patient is alert Skin: Skin is warm Psychiatric: Patient has normal mood and affect  Ortho Exam: Ortho exam demonstrates good cervical spine range of motion.  She has pretty  reasonable strength infraspinatus supraspinatus and subscap muscle testing.  Slightly weaker on the left compared to the right.  Motor or sensory function of the hand is intact.  No definite paresthesias C5-T1.  Specialty Comments:  MRI CERVICAL SPINE WITHOUT CONTRAST   TECHNIQUE: Multiplanar, multisequence MR imaging of the cervical spine was performed. No intravenous contrast was administered.   COMPARISON:  Cervical spine radiographs 04/23/2022 without report.   FINDINGS: Alignment: Slight retrolisthesis of C5 on C6.   Vertebrae: No fracture, evidence of discitis, or bone lesion.   Cord: Normal cord signal.   Posterior Fossa, vertebral arteries, paraspinal tissues: Approximately 2.6 cm right thyroid nodule.   Disc levels:   C2-C3: Right facet and uncovertebral hypertrophy with mild right foraminal stenosis. No significant canal or left foraminal stenosis.   C3-C4: Posterior disc osteophyte complex with bilateral facet uncovertebral hypertrophy. Resulting mild right foraminal stenosis. Patent canal.   C4-C5: Right greater than left facet and uncovertebral hypertrophy. Patent canal and foramina.   C5-C6: Right eccentric posterior disc osteophyte complex with mild canal stenosis and severe right foraminal stenosis. Patent left foramen with a.   C6-C7: Posterior disc osteophyte complex with bilateral facet uncovertebral hypertrophy. No significant stenosis.   C7-T1: No significant disc protrusion, foraminal stenosis, or canal stenosis.   IMPRESSION: 1. At C5-C6, severe right foraminal stenosis with mild canal stenosis. Milder multilevel degenerative changes detailed above. 2. Approximately 2.6 cm right thyroid nodule. Recommend thyroid US (ref: J Am Coll Radiol. 2015 Feb;12(2): 143-50).     Electronically Signed   By: Feliberto Harts M.D.   On: 06/04/2022 09:25  Imaging: No results found.   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 03/29/2022   Synovitis of left shoulder 03/29/2022   Family history of heart disease 01/07/2022   Allergic rhinitis 09/12/2020   Allergic rhinitis due to animal (cat) (dog) hair and dander 09/12/2020   Mild intermittent asthma 09/12/2020   Vasomotor rhinitis 09/12/2020   Hyperlipidemia, mild 05/17/2020   Impingement syndrome of left shoulder 04/03/2020   Urge incontinence 11/05/2018   Diverticulosis of colon without hemorrhage    Primary osteoarthritis of first carpometacarpal joint of left hand 06/22/2018   Chronic pain of left thumb 06/22/2018   History of retinal tear 10/20/2016   Cough 05/02/2016   Low back pain 09/28/2015   Insomnia 08/18/2013   OSA (obstructive sleep apnea) 07/31/2013   Thyroid nodule, cold 07/15/2012   Anxiety and depression 10/05/2007   Allergic rhinitis due to pollen 10/05/2007   Asthma 10/05/2007   Past Medical History:  Diagnosis Date   AC (acromioclavicular) joint bone spurs    left shoulder with chronic pain, w/limitation ROM   Allergy  weekly allergy shots   Anemia    hx of   Anxiety    Arthritis    Asthma    Depression    Fibrocystic breast disease    GERD (gastroesophageal reflux disease)    OSA (obstructive sleep apnea) 07/31/2013   Peptic ulcer disease    TMJ locking     Family History  Problem Relation Age of Onset   Osteoporosis Mother    Fibroids Mother    Other Mother        aortic valve replacement 1994, abdominal aortic aneurysm   Lung cancer Mother 74   Heart disease Father    Testicular cancer Father 66   Prostate cancer Father 61   Leukemia Father        CLL   Diabetes Maternal Grandmother    Heart disease Maternal Grandfather    Heart disease Paternal Grandmother    Diabetes Paternal Grandfather    Stomach cancer Paternal Grandfather    Breast  cancer Paternal Aunt    Breast cancer Cousin    Colon cancer Neg Hx    Throat cancer Neg Hx    Pancreatic cancer Neg Hx     Past Surgical History:  Procedure Laterality Date   ABDOMINAL HYSTERECTOMY  2003   due to fibroids   ADENOIDECTOMY     x3   BICEPT TENODESIS Left 03/25/2022   Procedure: BICEPS TENODESIS;  Surgeon: [name], MD;  Location: [facility] OR;  Service: Orthopedics;  Laterality: Left;   BIOPSY  06/29/2018   Procedure: BIOPSY;  Surgeon: Shellia Cleverly, DO;  Location: [facility];  Service: Gastroenterology;;   BREAST CYST ASPIRATION     COLONOSCOPY  03/10/2007   results-normal   COLONOSCOPY WITH PROPOFOL N/A 06/29/2018   Procedure: COLONOSCOPY WITH PROPOFOL;  Surgeon: Shellia Cleverly, DO;  Location: [facility];  Service: Gastroenterology;  Laterality: N/A;   DILATION AND CURETTAGE OF UTERUS     HAMMER TOE SURGERY     POLYPECTOMY  06/29/2018   Procedure: POLYPECTOMY;  Surgeon: Shellia Cleverly, DO;  Location: [facility];  Service: Gastroenterology;;   PUBOVAGINAL SLING  '90's   for incontinence   RHINOPLASTY  1984   ROTATOR CUFF REPAIR Right 2013   [facility]   ROTATOR CUFF REPAIR Left 2012   [facility]   SHOULDER ARTHROSCOPY WITH ROTATOR CUFF REPAIR AND SUBACROMIAL DECOMPRESSION Left 03/25/2022   Procedure: LEFT SHOULDER ARTHROSCOPY, SUBACROMIAL DECOMPRESSION, DEBRIDEMENT, MINI OPEN ROTATOR CUFF TEAR REPAIR;  Surgeon: [name], MD;  Location: [facility];  Service: Orthopedics;  Laterality: Left;   TONSILLECTOMY     x2   TUBAL LIGATION     TURBINATE REDUCTION  1997   WRIST SURGERY     Social History   Occupational History   Not on file  Tobacco Use   Smoking status: Former    Packs/day: 2.00    Years: 15.00    Additional pack years: 0.00    Total pack years: 30.00    Types: Cigarettes    Quit date: 11/19/1989    Years since quitting: 32.8   Smokeless tobacco: Never  Vaping Use   Vaping Use: Never used  Substance and Sexual  Activity   Alcohol use: Yes    Comment: maybe a glass of wine on the weekend   Drug use: No   Sexual activity: Not on file

## 2022-09-14 ENCOUNTER — Other Ambulatory Visit: Payer: Self-pay | Admitting: Orthopedic Surgery

## 2022-10-08 ENCOUNTER — Telehealth (INDEPENDENT_AMBULATORY_CARE_PROVIDER_SITE_OTHER): Payer: Medicare Other | Admitting: Family

## 2022-10-08 DIAGNOSIS — U071 COVID-19: Secondary | ICD-10-CM

## 2022-10-08 MED ORDER — NIRMATRELVIR/RITONAVIR (PAXLOVID)TABLET: 3.0000 | ORAL_TABLET | Freq: Two times a day (BID) | ORAL | 0 refills | Status: AC

## 2022-10-08 NOTE — Progress Notes (Signed)
MyChart Video Visit    Virtual Visit via Video Note   This format is felt to be most appropriate for this patient at this time. Physical exam was limited by quality of the video and audio technology used for the visit. CMA was able to get the patient set up on a video visit.  Patient location: Home. Patient and provider in visit Provider location: Office  I discussed the limitations of evaluation and management by telemedicine and the availability of in person appointments. The patient expressed understanding and agreed to proceed.  Visit Date: 10/08/2022  Today's healthcare provider: Dulce Sellar, NP     Subjective:   Patient ID: Chelsey Jackson, female    DOB: 02-05-1951, 72 y.o.   MRN: [id number]  Chief Complaint  Patient presents with   Covid Positive    sx for 1d    HPI Covid positive: Pt tested positive for covid this morning. Sx started yesterday, Sx include Productive cough with yellow/green mucus, Nasal congestion and headaches. Has been drinking a lot of fluids. Reports having covid 3x before, 2 times previously she was advised to take Molnupiravir d/t drug interactions w/Paxlovid.    Assessment & Plan:  COVID-19 - Sending Paxlovid, pt advised of FDA label approval for use, how to take, & SE. Advised to hold her Crestor for 5 days while taking Paxlovid and 2 days after. Advised of CDC guidelines for masking if out in public. OK to continue taking OTC sinus or pain meds. Encouraged to monitor & notify office of any worsening symptoms: increased shortness of breath, weakness, and signs of dehydration. Instructed to rest and hydrate well.   -     nirmatrelvir/ritonavir; Take 3 tablets by mouth 2 (two) times daily for 5 days. (Take nirmatrelvir 150 mg two tablets twice daily for 5 days and ritonavir 100 mg one tablet twice daily for 5 days)  Dispense: 30 tablet; Refill: 0   Past Medical History:  Diagnosis Date   AC (acromioclavicular) joint bone spurs     left shoulder with chronic pain, w/limitation ROM   Allergy    weekly allergy shots   Anemia    hx of   Anxiety    Arthritis    Asthma    Depression    Fibrocystic breast disease    GERD (gastroesophageal reflux disease)    OSA (obstructive sleep apnea) 07/31/2013   Peptic ulcer disease    TMJ locking     Past Surgical History:  Procedure Laterality Date   ABDOMINAL HYSTERECTOMY  2003   due to fibroids   ADENOIDECTOMY     x3   BICEPT TENODESIS Left 03/25/2022   Procedure: BICEPS TENODESIS;  Surgeon: [name], MD;  Location: [facility] OR;  Service: Orthopedics;  Laterality: Left;   BIOPSY  06/29/2018   Procedure: BIOPSY;  Surgeon: Shellia Cleverly, DO;  Location: [facility];  Service: Gastroenterology;;   BREAST CYST ASPIRATION     COLONOSCOPY  03/10/2007   results-normal   COLONOSCOPY WITH PROPOFOL N/A 06/29/2018   Procedure: COLONOSCOPY WITH PROPOFOL;  Surgeon: Shellia Cleverly, DO;  Location: [facility];  Service: Gastroenterology;  Laterality: N/A;   DILATION AND CURETTAGE OF UTERUS     HAMMER TOE SURGERY     POLYPECTOMY  06/29/2018   Procedure: POLYPECTOMY;  Surgeon: Shellia Cleverly, DO;  Location: [facility];  Service: Gastroenterology;;   PUBOVAGINAL SLING  '90's   for incontinence   RHINOPLASTY  1984   ROTATOR CUFF  REPAIR Right 2013   [facility]   ROTATOR CUFF REPAIR Left 2012   [facility]   SHOULDER ARTHROSCOPY WITH ROTATOR CUFF REPAIR AND SUBACROMIAL DECOMPRESSION Left 03/25/2022   Procedure: LEFT SHOULDER ARTHROSCOPY, SUBACROMIAL DECOMPRESSION, DEBRIDEMENT, MINI OPEN ROTATOR CUFF TEAR REPAIR;  Surgeon: [name], MD;  Location: [facility] OR;  Service: Orthopedics;  Laterality: Left;   TONSILLECTOMY     x2   TUBAL LIGATION     TURBINATE REDUCTION  1997   WRIST SURGERY      Outpatient Medications Prior to Visit  Medication Sig Dispense Refill   albuterol (VENTOLIN HFA) 108 (90 Base) MCG/ACT inhaler Inhale 1-2 puffs into the lungs every 6  (six) hours as needed for wheezing or shortness of breath. 18 g 2   buPROPion (WELLBUTRIN XL) 300 MG 24 hr tablet TAKE 1 TABLET BY MOUTH DAILY 90 tablet 0   EPINEPHrine 0.3 mg/0.3 mL IJ SOAJ injection Inject 0.3 mg into the muscle as needed for anaphylaxis.     fluticasone (FLONASE) 50 MCG/ACT nasal spray Place 2 sprays into both nostrils at bedtime.     hydroxypropyl methylcellulose / hypromellose (ISOPTO TEARS / GONIOVISC) 2.5 % ophthalmic solution Place 1 drop into both eyes as needed for dry eyes.     ipratropium (ATROVENT) 0.03 % nasal spray Place 2 sprays into both nostrils daily.     Iron-Vitamin C 65-125 MG TABS Take 1 tablet by mouth every evening.     levocetirizine (XYZAL) 5 MG tablet Take 5 mg by mouth every evening.     Multiple Vitamin (MULTIVITAMIN) tablet Take 1 tablet by mouth daily.     rosuvastatin (CRESTOR) 5 MG tablet TAKE 1 TABLET BY MOUTH DAILY 90 tablet 1   temazepam (RESTORIL) 30 MG capsule TAKE 1 CAPSULE BY MOUTH AT BEDTIME 90 capsule 0   gabapentin (NEURONTIN) 100 MG capsule TAKE 1 CAPSULE BY MOUTH TWICE A DAY FOR 7 DAYS 30 capsule 0   Facility-Administered Medications Prior to Visit  Medication Dose Route Frequency Provider Last Rate Last Admin   0.9 %  sodium chloride infusion  500 mL Intravenous Once Tressia Danas, MD        Allergies  Allergen Reactions   Penicillins Anaphylaxis    REACTION: stop breathing       Objective:   Physical Exam Vitals and nursing note reviewed.  Constitutional:      General: Pt is not in acute distress.    Appearance: Normal appearance.  HENT:     Head: Normocephalic.  Pulmonary:     Effort: No respiratory distress.  Musculoskeletal:     Cervical back: Normal range of motion.  Skin:    General: Skin is dry.     Coloration: Skin is not pale.  Neurological:     Mental Status: Pt is alert and oriented to person, place, and time.  Psychiatric:        Mood and Affect: Mood normal.   LMP  (LMP Unknown)   Wt  Readings from Last 3 Encounters:  08/26/22 185 lb 8 oz (84.1 kg)  07/29/22 178 lb (80.7 kg)  03/25/22 185 lb (83.9 kg)        I discussed the assessment and treatment plan with the patient. The patient was provided an opportunity to ask questions and all were answered. The patient agreed with the plan and demonstrated an understanding of the instructions.   The patient was advised to call back or seek an in-person evaluation if the symptoms worsen or  if the condition fails to improve as anticipated.  Dulce Sellar, NP [facility] [place] [phone] (phone) [phone] (fax)  [facility]

## 2022-10-09 ENCOUNTER — Other Ambulatory Visit: Payer: Self-pay | Admitting: Physician Assistant

## 2022-10-10 ENCOUNTER — Other Ambulatory Visit (HOSPITAL_BASED_OUTPATIENT_CLINIC_OR_DEPARTMENT_OTHER): Payer: Self-pay | Admitting: Cardiology

## 2022-10-10 NOTE — Telephone Encounter (Signed)
Rx(s) sent to pharmacy electronically.  

## 2022-10-15 ENCOUNTER — Ambulatory Visit: Payer: Medicare Other | Admitting: Orthopedic Surgery

## 2022-10-22 ENCOUNTER — Ambulatory Visit: Payer: Medicare Other | Admitting: Orthopedic Surgery

## 2022-11-03 ENCOUNTER — Other Ambulatory Visit: Payer: Self-pay | Admitting: Physician Assistant

## 2022-11-03 MED ORDER — BUPROPION HCL ER (XL) 300 MG PO TB24: 300.0000 mg | ORAL_TABLET | Freq: Every day | ORAL | 0 refills | Status: DC

## 2022-11-06 ENCOUNTER — Ambulatory Visit: Payer: Medicare Other | Admitting: Orthopedic Surgery

## 2022-11-12 ENCOUNTER — Ambulatory Visit (INDEPENDENT_AMBULATORY_CARE_PROVIDER_SITE_OTHER): Payer: Medicare Other | Admitting: Orthopedic Surgery

## 2022-11-12 DIAGNOSIS — M5412 Radiculopathy, cervical region: Secondary | ICD-10-CM

## 2022-11-12 DIAGNOSIS — G8929 Other chronic pain: Secondary | ICD-10-CM

## 2022-11-12 DIAGNOSIS — M25512 Pain in left shoulder: Secondary | ICD-10-CM | POA: Diagnosis not present

## 2022-11-14 ENCOUNTER — Encounter: Payer: Self-pay | Admitting: Orthopedic Surgery

## 2022-11-14 NOTE — Progress Notes (Signed)
Office Visit Note   Patient: Chelsey Jackson           Date of Birth: June 13, 1950           MRN: [id number] Visit Date: 11/12/2022 Requested by: Jarold Motto, [place] [address] [place],  [place] [address] PCP: Jarold Motto, [place]  Subjective: Chief Complaint  Patient presents with   Left Shoulder - Follow-up    HPI: Chelsey Jackson is a 72 y.o. female who presents to the office reporting .  Left shoulder pain.  Patient had glenohumeral injection 09/03/2022 which did not really give her much relief.  The pain radiates into the biceps region.  She underwent shoulder arthroscopy with rotator cuff tear repair about 7 months ago.  Ice is the only thing that really gives her any relief.  The pain does wake her from sleep at night.  She is right-hand dominant.  Denies any numbness and tingling and denies any scapular pain on the left-hand side.  Pain started around February or March.  Initially the pain was in the middle of the humerus.  Subsequent MRI scan of the mid humeral region was unremarkable.  There is a suggestion of at least partial retear of the rotator cuff.  The glenohumeral injection on 09/03/2022 was a particularly painful injection for her.              ROS: All systems reviewed are negative as they relate to the chief complaint within the history of present illness.  Patient denies fevers or chills.  Assessment & Plan: Visit Diagnoses: No diagnosis found.  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  I think she may have a partial retear of her rotator cuff.  Other possibilities that this could be coming from her neck.  Plan at this time is recheck her pain trend in about 2-1/2 months.  I think her options moving forward would potentially be observation versus dedicated MRI scanning of the shoulder if she is considering redo rotator cuff tear repair versus eventual reverse shoulder replacement if she does have  large recurrent tearing of the rotator cuff.  Her strength at this time is pretty reasonable but her pain has not really improved over the past several months.  Follow-Up Instructions: No follow-ups on file.   Orders:  No orders of the defined types were placed in this encounter.  No orders of the defined types were placed in this encounter.     Procedures: No procedures performed   Clinical Data: No additional findings.  Objective: Vital Signs: LMP  (LMP Unknown)   Physical Exam:  Constitutional: Patient appears well-developed HEENT:  Head: Normocephalic Eyes:EOM are normal Neck: Normal range of motion Cardiovascular: Normal rate Pulmonary/chest: Effort normal Neurologic: Patient is alert Skin: Skin is warm Psychiatric: Patient has normal mood and affect  Ortho Exam: Ortho exam demonstrates mild crepitus with internal/external rotation of the left arm.  Pretty reasonable strength in the internal rotation and external rotation on the left.  No warmth to the shoulder.  Incisions intact.  No masses lymphadenopathy or skin changes noted in that shoulder girdle region.  Neck range of motion intact.  Motor or sensory function of the arm is intact.  Her mid humeral pain has dissipated but now the pain is moved more proximally.  Specialty Comments:  MRI CERVICAL SPINE WITHOUT CONTRAST   TECHNIQUE: Multiplanar, multisequence MR imaging of the cervical spine was performed. No intravenous contrast was  administered.   COMPARISON:  Cervical spine radiographs 04/23/2022 without report.   FINDINGS: Alignment: Slight retrolisthesis of C5 on C6.   Vertebrae: No fracture, evidence of discitis, or bone lesion.   Cord: Normal cord signal.   Posterior Fossa, vertebral arteries, paraspinal tissues: Approximately 2.6 cm right thyroid nodule.   Disc levels:   C2-C3: Right facet and uncovertebral hypertrophy with mild right foraminal stenosis. No significant canal or left foraminal  stenosis.   C3-C4: Posterior disc osteophyte complex with bilateral facet uncovertebral hypertrophy. Resulting mild right foraminal stenosis. Patent canal.   C4-C5: Right greater than left facet and uncovertebral hypertrophy. Patent canal and foramina.   C5-C6: Right eccentric posterior disc osteophyte complex with mild canal stenosis and severe right foraminal stenosis. Patent left foramen with a.   C6-C7: Posterior disc osteophyte complex with bilateral facet uncovertebral hypertrophy. No significant stenosis.   C7-T1: No significant disc protrusion, foraminal stenosis, or canal stenosis.   IMPRESSION: 1. At C5-C6, severe right foraminal stenosis with mild canal stenosis. Milder multilevel degenerative changes detailed above. 2. Approximately 2.6 cm right thyroid nodule. Recommend thyroid US (ref: J Am Coll Radiol. 2015 Feb;12(2): 143-50).     Electronically Signed   By: Feliberto Harts M.D.   On: 06/04/2022 09:25  Imaging: No results found.   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 (SLAP) tear of left shoulder 03/29/2022   Synovitis of left shoulder 03/29/2022   Family history of heart disease 01/07/2022   Allergic rhinitis 09/12/2020   Allergic rhinitis due to animal (cat) (dog) hair and dander 09/12/2020   Mild intermittent asthma 09/12/2020   Vasomotor rhinitis 09/12/2020   Hyperlipidemia, mild 05/17/2020   Impingement syndrome of left shoulder 04/03/2020   Urge incontinence 11/05/2018   Diverticulosis of colon without hemorrhage    Primary osteoarthritis of first carpometacarpal joint of left hand 06/22/2018   Chronic pain of left thumb 06/22/2018   History of retinal tear 10/20/2016   Cough 05/02/2016   Low back pain 09/28/2015   Insomnia 08/18/2013   OSA (obstructive sleep apnea)  07/31/2013   Thyroid nodule, cold 07/15/2012   Anxiety and depression 10/05/2007   Allergic rhinitis due to pollen 10/05/2007   Asthma 10/05/2007   Past Medical History:  Diagnosis Date   AC (acromioclavicular) joint bone spurs    left shoulder with chronic pain, w/limitation ROM   Allergy    weekly allergy shots   Anemia    hx of   Anxiety    Arthritis    Asthma    Depression    Fibrocystic breast disease    GERD (gastroesophageal reflux disease)    OSA (obstructive sleep apnea) 07/31/2013   Peptic ulcer disease    TMJ locking     Family History  Problem Relation Age of Onset   Osteoporosis Mother    Fibroids Mother    Other Mother        aortic valve replacement 1994, abdominal aortic aneurysm   Lung cancer Mother 37   Heart disease Father    Testicular cancer Father 35   Prostate cancer Father 50   Leukemia Father        CLL   Diabetes Maternal Grandmother    Heart disease Maternal Grandfather    Heart disease Paternal Grandmother    Diabetes Paternal Grandfather    Stomach cancer  Paternal Grandfather    Breast cancer Paternal Aunt    Breast cancer Cousin    Colon cancer Neg Hx    Throat cancer Neg Hx    Pancreatic cancer Neg Hx     Past Surgical History:  Procedure Laterality Date   ABDOMINAL HYSTERECTOMY  2003   due to fibroids   ADENOIDECTOMY     x3   BICEPT TENODESIS Left 03/25/2022   Procedure: BICEPS TENODESIS;  Surgeon: [name], MD;  Location: [facility] OR;  Service: Orthopedics;  Laterality: Left;   BIOPSY  06/29/2018   Procedure: BIOPSY;  Surgeon: Shellia Cleverly, DO;  Location: [facility];  Service: Gastroenterology;;   BREAST CYST ASPIRATION     COLONOSCOPY  03/10/2007   results-normal   COLONOSCOPY WITH PROPOFOL N/A 06/29/2018   Procedure: COLONOSCOPY WITH PROPOFOL;  Surgeon: Shellia Cleverly, DO;  Location: [facility];  Service: Gastroenterology;  Laterality: N/A;   DILATION AND CURETTAGE OF UTERUS     HAMMER TOE SURGERY      POLYPECTOMY  06/29/2018   Procedure: POLYPECTOMY;  Surgeon: Shellia Cleverly, DO;  Location: [facility];  Service: Gastroenterology;;   PUBOVAGINAL SLING  '90's   for incontinence   RHINOPLASTY  1984   ROTATOR CUFF REPAIR Right 2013   [place]   ROTATOR CUFF REPAIR Left 2012   [place]   SHOULDER ARTHROSCOPY WITH ROTATOR CUFF REPAIR AND SUBACROMIAL DECOMPRESSION Left 03/25/2022   Procedure: LEFT SHOULDER ARTHROSCOPY, SUBACROMIAL DECOMPRESSION, DEBRIDEMENT, MINI OPEN ROTATOR CUFF TEAR REPAIR;  Surgeon: [name], MD;  Location: [facility] OR;  Service: Orthopedics;  Laterality: Left;   TONSILLECTOMY     x2   TUBAL LIGATION     TURBINATE REDUCTION  1997   WRIST SURGERY     Social History   Occupational History   Not on file  Tobacco Use   Smoking status: Former    Current packs/day: 0.00    Average packs/day: 2.0 packs/day for 15.0 years (30.0 ttl pk-yrs)    Types: Cigarettes    Start date: 11/20/1974    Quit date: 11/19/1989    Years since quitting: 33.0   Smokeless tobacco: Never  Vaping Use   Vaping status: Never Used  Substance and Sexual Activity   Alcohol use: Yes    Comment: maybe a glass of wine on the weekend   Drug use: No   Sexual activity: Not on file

## 2022-11-21 ENCOUNTER — Other Ambulatory Visit: Payer: Self-pay | Admitting: Physician Assistant

## 2022-11-24 NOTE — Telephone Encounter (Signed)
Pt requesting refill for Temazepam 30 mg. Last OV 08/26/2022.

## 2022-12-10 ENCOUNTER — Encounter: Payer: Self-pay | Admitting: Physician Assistant

## 2022-12-12 ENCOUNTER — Ambulatory Visit (HOSPITAL_BASED_OUTPATIENT_CLINIC_OR_DEPARTMENT_OTHER): Payer: Medicare Other | Admitting: Cardiology

## 2022-12-15 ENCOUNTER — Ambulatory Visit (INDEPENDENT_AMBULATORY_CARE_PROVIDER_SITE_OTHER): Payer: Medicare Other | Admitting: Cardiology

## 2022-12-15 ENCOUNTER — Encounter (HOSPITAL_BASED_OUTPATIENT_CLINIC_OR_DEPARTMENT_OTHER): Payer: Self-pay | Admitting: Cardiology

## 2022-12-15 VITALS — BP 122/76 | HR 74 | Ht 68.0 in | Wt 185.6 lb

## 2022-12-15 DIAGNOSIS — G4733 Obstructive sleep apnea (adult) (pediatric): Secondary | ICD-10-CM | POA: Diagnosis not present

## 2022-12-15 DIAGNOSIS — E785 Hyperlipidemia, unspecified: Secondary | ICD-10-CM

## 2022-12-15 DIAGNOSIS — Z7189 Other specified counseling: Secondary | ICD-10-CM | POA: Diagnosis not present

## 2022-12-15 DIAGNOSIS — Z8249 Family history of ischemic heart disease and other diseases of the circulatory system: Secondary | ICD-10-CM | POA: Diagnosis not present

## 2022-12-15 NOTE — Patient Instructions (Addendum)
Medication Instructions:  Your physician recommends that you continue on your current medications as directed. Please refer to the Current Medication list given to you today.  *If you need a refill on your cardiac medications before your next appointment, please call your pharmacy*  Lab Work: LIPID IN COUPLE OF WEEKS  Testing/Procedures: NONE  Follow-Up: At [facility], you and your health needs are our priority.  As part of our continuing mission to provide you with exceptional heart care, we have created designated Provider Care Teams.  These Care Teams include your primary Cardiologist (physician) and Advanced Practice Providers (APPs -  Physician Assistants and Nurse Practitioners) who all work together to provide you with the care you need, when you need it.  We recommend signing up for the patient portal called "MyChart".  Sign up information is provided on this After Visit Summary.  MyChart is used to connect with patients for Virtual Visits (Telemedicine).  Patients are able to view lab/test results, encounter notes, upcoming appointments, etc.  Non-urgent messages can be sent to your provider as well.   To learn more about what you can do with MyChart, go to [email or web address].    Your next appointment:   12 month(s)  The format for your next appointment:   In Person  Provider:   Jodelle Red, MD OR Ronn Melena NP

## 2022-12-15 NOTE — Progress Notes (Signed)
Cardiology Office Note:  .    Date:  12/15/2022  ID:  Eloy End, DOB 1950-10-03, MRN [id number] PCP: Jarold Motto, [place]  [facility] Providers Cardiologist:  Jodelle Red, MD     History of Present Illness: .    Chelsey Jackson is a 72 y.o. female with a hx of hyperlipidemia, OSA on CPAP, who is seen for follow up. She was initially seen as a new consult at the request of Jarold Motto, [place] for the evaluation and management of family history of cardiovascular disease.   Cardiovascular risk factors: Prior clinical ASCVD: none Comorbid conditions, including hypertension, hyperlipidemia, diabetes, chronic kidney disease: borderline lipids Metabolic syndrome/Obesity: highest adult weight 200 lbs Chronic inflammatory conditions: none Tobacco use history: former, quit 12/11/1989 (on her birthday) Family history: father had MI, first age late 60s/early 67s. He had survived metastatic testicular cancer in his 15s. Had two more stents put in age late 70s/early 31s, diagnosed with afib at that time. Deceased age 37. Mother had a fever when she was young, told she had MVP but had AVR in her early-mid 29s, passed away from lung cancer age 64. 2 siblings, sister and brother. Brother is 9 years younger, had nuclear stress test that was normal, started on a statin. Prior cardiac testing and/or incidental findings on other testing (ie coronary calcium): none Exercise level: deep water aerobics 3x/week for the last several months, walks with her husband on occasion (heat limits her) Current diet: "everything". Chicken, fish, fruits/veg. Fried food rarely (special occasions).   Today, she appears well overall with no new cardiovascular concerns. However, she has been struggling with her left shoulder, constant pain of 3/10 severity and sometimes worse. She underwent unsuccessful shoulder surgery in 03/2022. Reports a new tear per MRI in 07/2022.   She is able to climb upstairs  or walk uphill without significant issues. Needed her inhaler once while hiking several months ago. She continues to exercise with deep water aerobics at the [facility], three times a week. Unable to use resistance exercises due to her shoulder.   Continues to tolerate oral appliance use at night.  She denies any palpitations, chest pain, shortness of breath, peripheral edema, lightheadedness, headaches, syncope, orthopnea, or PND.  ROS:  Please see the history of present illness. ROS otherwise negative except as noted.  (+) Left shoulder pain  Studies Reviewed: Marland Kitchen    EKG Interpretation Date/Time:  Monday December 15 2022 09:11:36 EDT Ventricular Rate:  74 PR Interval:  150 QRS Duration:  88 QT Interval:  396 QTC Calculation: 439 R Axis:   -30  Text Interpretation: Sinus rhythm with occasional Premature ventricular complexes Left axis deviation Possible Anterior infarct , age undetermined Confirmed by Jodelle Red [phone]) on 12/15/2022 9:29:11 AM    Physical Exam:    VS:  BP 122/76 (BP Location: Left Arm, Patient Position: Sitting, Cuff Size: Normal)   Pulse 74   Ht 5\' 8"  (1.727 m)   Wt 185 lb 9.6 oz (84.2 kg)   LMP  (LMP Unknown)   BMI 28.22 kg/m    Wt Readings from Last 3 Encounters:  12/15/22 185 lb 9.6 oz (84.2 kg)  08/26/22 185 lb 8 oz (84.1 kg)  07/29/22 178 lb (80.7 kg)    GEN: Well nourished, well developed in no acute distress HEENT: Normal, moist mucous membranes NECK: No JVD CARDIAC: regular rhythm, normal S1 and S2, no rubs or gallops. No murmur. VASCULAR: Radial and DP pulses 2+ bilaterally. No carotid  bruits RESPIRATORY:  Clear to auscultation without rales, wheezing or rhonchi  ABDOMEN: Soft, non-tender, non-distended MUSCULOSKELETAL:  Ambulates independently SKIN: Warm and dry, no edema NEUROLOGIC:  Alert and oriented x 3. No focal neuro deficits noted. PSYCHIATRIC:  Normal affect   ASSESSMENT AND PLAN: .    Family history of heart disease  Mild  dyslipidemia Review of CV risk -changed from pravastatin to rosuvastatin since last visit. Tolerating. Due for recheck lipids, ordered today  OSA: on oral appliance   Cardiac risk counseling and prevention recommendations: -recommend heart healthy/Mediterranean diet, with whole grains, fruits, vegetable, fish, lean meats, nuts, and olive oil. Limit salt. -recommend moderate walking, 3-5 times/week for 30-50 minutes each session. Aim for at least 150 minutes.week. Goal should be pace of 3 miles/hours, or walking 1.5 miles in 30 minutes -recommend avoidance of tobacco products. Avoid excess alcohol. -ASCVD risk score: The 10-year ASCVD risk score (Arnett DK, et al., 2019) is: 8.4%   Values used to calculate the score:     Age: 27 years     Sex: Female     Is Non-Hispanic African American: No     Diabetic: No     Tobacco smoker: No     Systolic Blood Pressure: 122 mmHg     Is BP treated: No     HDL Cholesterol: 40 mg/dL     Total Cholesterol: 148 mg/dL   Dispo: Follow-up in 1 year, or sooner as needed.  I,Mathew Stumpf,acting as a Neurosurgeon for [employer], MD.,have documented all relevant documentation on the behalf of Jodelle Red, MD,as directed by  Jodelle Red, MD while in the presence of Jodelle Red, MD.  I, Jodelle Red, MD, have reviewed all documentation for this visit. The documentation on 12/15/22 for the exam, diagnosis, procedures, and orders are all accurate and complete.   Signed, Jodelle Red, MD

## 2022-12-17 ENCOUNTER — Ambulatory Visit: Payer: Medicare Other | Admitting: Primary Care

## 2023-01-06 ENCOUNTER — Other Ambulatory Visit (HOSPITAL_BASED_OUTPATIENT_CLINIC_OR_DEPARTMENT_OTHER): Payer: Self-pay | Admitting: Cardiology

## 2023-01-07 ENCOUNTER — Encounter: Payer: Self-pay | Admitting: Physician Assistant

## 2023-01-20 ENCOUNTER — Other Ambulatory Visit (HOSPITAL_BASED_OUTPATIENT_CLINIC_OR_DEPARTMENT_OTHER): Payer: Self-pay

## 2023-01-20 MED ORDER — COVID-19 MRNA VAC-TRIS(PFIZER) 30 MCG/0.3ML IM SUSY
0.3000 mL | PREFILLED_SYRINGE | Freq: Once | INTRAMUSCULAR | 0 refills | Status: AC
  Filled 2023-01-20: qty 0.3, 1d supply, fill #0

## 2023-01-20 MED ORDER — INFLUENZA VAC A&B SURF ANT ADJ 0.5 ML IM SUSY
0.5000 mL | PREFILLED_SYRINGE | Freq: Once | INTRAMUSCULAR | 0 refills | Status: AC
  Filled 2023-01-20: qty 0.5, 1d supply, fill #0

## 2023-01-21 LAB — LIPID PANEL
Chol/HDL Ratio: 4.4 {ratio} (ref 0.0–4.4)
Cholesterol, Total: 148 mg/dL (ref 100–199)
HDL: 34 mg/dL — ABNORMAL LOW (ref >39)
LDL Chol Calc (NIH): 88 mg/dL (ref 0–99)
Triglycerides: 144 mg/dL (ref 0–149)
VLDL Cholesterol Cal: 26 mg/dL (ref 5–40)

## 2023-01-29 ENCOUNTER — Other Ambulatory Visit: Payer: Self-pay | Admitting: Physician Assistant

## 2023-02-04 ENCOUNTER — Ambulatory Visit: Payer: Medicare Other | Admitting: Orthopedic Surgery

## 2023-04-19 ENCOUNTER — Other Ambulatory Visit: Payer: Self-pay | Admitting: Physician Assistant

## 2023-04-27 ENCOUNTER — Ambulatory Visit (INDEPENDENT_AMBULATORY_CARE_PROVIDER_SITE_OTHER): Payer: Medicare Other | Admitting: Physician Assistant

## 2023-04-27 ENCOUNTER — Encounter: Payer: Self-pay | Admitting: Physician Assistant

## 2023-04-27 VITALS — BP 140/88 | HR 60 | Temp 97.0°F

## 2023-04-27 DIAGNOSIS — R0981 Nasal congestion: Secondary | ICD-10-CM

## 2023-04-27 MED ORDER — DOXYCYCLINE HYCLATE 100 MG PO TABS: 100.0000 mg | ORAL_TABLET | Freq: Two times a day (BID) | ORAL | 0 refills | Status: DC

## 2023-04-27 NOTE — Patient Instructions (Signed)
 It was great to see you!  Continue allergy scripts as recommended by Fleeta Smock  Lets trial new antibiotic(s) - doxycycline   If needed, we may need to add on a course of oral prednisone  -- message me if this is the case  If still no improvement, we may need to consider referral to ENT vs CT scan of sinuses  Take care,  Lucie Buttner PA-C

## 2023-04-27 NOTE — Progress Notes (Signed)
 Chelsey Jackson is a 73 y.o. female here for a follow up of a pre-existing problem.  History of Present Illness:   Chief Complaint  Patient presents with   Cough    Productive cough, sinus drainage    Cough/Bronchitis  She reports being diagnosed with bronchitis at [facility].  She was treated with depomedrol injection, antibiotics, and a nebulizer treatments  She states that her symptoms have improved but continue to linger. She denies experiencing any new symptoms at this time.  She was prescribed Breyna 80-4.5 mcg/ act inhaler per her allergist.  However, she has never used her new inhaler before as she has not been experiencing any wheezing but her symptoms seem more congestion like.   She is currently experiencing sinus pressure, nasal congestion along with sputum production.  She has been taking Xyzal 5 mg every day.   She also reports experiencing ear pain (ear pop) every now and then but denies any sore throat, recent travel, or  Previous Covid test was negative.    Past Medical History:  Diagnosis Date   AC (acromioclavicular) joint bone spurs    left shoulder with chronic pain, w/limitation ROM   Allergy    weekly allergy shots   Anemia    hx of   Anxiety    Arthritis    Asthma    Depression    Fibrocystic breast disease    GERD (gastroesophageal reflux disease)    OSA (obstructive sleep apnea) 07/31/2013   Peptic ulcer disease    TMJ locking      Social History   Tobacco Use   Smoking status: Former    Current packs/day: 0.00    Average packs/day: 2.0 packs/day for 15.0 years (30.0 ttl pk-yrs)    Types: Cigarettes    Start date: 11/20/1974    Quit date: 11/19/1989    Years since quitting: 33.4   Smokeless tobacco: Never  Vaping Use   Vaping status: Never Used  Substance Use Topics   Alcohol use: Yes    Comment: maybe a glass of wine on the weekend   Drug use: No    Past Surgical History:  Procedure Laterality Date   ABDOMINAL HYSTERECTOMY  2003    due to fibroids   ADENOIDECTOMY     x3   BICEPT TENODESIS Left 03/25/2022   Procedure: BICEPS TENODESIS;  Surgeon: Addie Cordella Hamilton, MD;  Location: [facility];  Service: Orthopedics;  Laterality: Left;   BIOPSY  06/29/2018   Procedure: BIOPSY;  Surgeon: San Sandor GAILS, DO;  Location: [facility];  Service: Gastroenterology;;   BREAST CYST ASPIRATION     COLONOSCOPY  03/10/2007   results-normal   COLONOSCOPY WITH PROPOFOL  N/A 06/29/2018   Procedure: COLONOSCOPY WITH PROPOFOL ;  Surgeon: San Sandor GAILS, DO;  Location: [facility];  Service: Gastroenterology;  Laterality: N/A;   DILATION AND CURETTAGE OF UTERUS     HAMMER TOE SURGERY     POLYPECTOMY  06/29/2018   Procedure: POLYPECTOMY;  Surgeon: San Sandor GAILS, DO;  Location: [facility];  Service: Gastroenterology;;   PUBOVAGINAL SLING  '90's   for incontinence   RHINOPLASTY  1984   ROTATOR CUFF REPAIR Right 2013   [facility]   ROTATOR CUFF REPAIR Left 2012   [facility]   SHOULDER ARTHROSCOPY WITH ROTATOR CUFF REPAIR AND SUBACROMIAL DECOMPRESSION Left 03/25/2022   Procedure: LEFT SHOULDER ARTHROSCOPY, SUBACROMIAL DECOMPRESSION, DEBRIDEMENT, MINI OPEN ROTATOR CUFF TEAR REPAIR;  Surgeon: Addie Cordella Hamilton, MD;  Location: [facility];  Service: Orthopedics;  Laterality: Left;   TONSILLECTOMY     x2   TUBAL LIGATION     TURBINATE REDUCTION  1997   WRIST SURGERY      Family History  Problem Relation Age of Onset   Osteoporosis Mother    Fibroids Mother    Other Mother        aortic valve replacement 1994, abdominal aortic aneurysm   Lung cancer Mother 39   Heart disease Father    Testicular cancer Father 49   Prostate cancer Father 22   Leukemia Father        CLL   Diabetes Maternal Grandmother    Heart disease Maternal Grandfather    Heart disease Paternal Grandmother    Diabetes Paternal Grandfather    Stomach cancer Paternal Grandfather    Breast cancer Paternal Aunt    Breast cancer Cousin    Colon cancer Neg Hx     Throat cancer Neg Hx    Pancreatic cancer Neg Hx     Allergies  Allergen Reactions   Penicillins Anaphylaxis    REACTION: stop breathing    Current Medications:   Current Outpatient Medications:    BREYNA 80-4.5 MCG/ACT inhaler, Inhale 2 puffs into the lungs as needed., Disp: , Rfl:    buPROPion  (WELLBUTRIN  XL) 300 MG 24 hr tablet, TAKE 1 TABLET BY MOUTH DAILY, Disp: 90 tablet, Rfl: 0   doxycycline  (VIBRA -TABS) 100 MG tablet, Take 1 tablet (100 mg total) by mouth 2 (two) times daily., Disp: 14 tablet, Rfl: 0   EPINEPHrine  0.3 mg/0.3 mL IJ SOAJ injection, Inject 0.3 mg into the muscle as needed for anaphylaxis., Disp: , Rfl:    fluticasone (FLONASE) 50 MCG/ACT nasal spray, Place 2 sprays into both nostrils at bedtime., Disp: , Rfl:    hydroxypropyl methylcellulose / hypromellose (ISOPTO TEARS / GONIOVISC) 2.5 % ophthalmic solution, Place 1 drop into both eyes as needed for dry eyes., Disp: , Rfl:    ipratropium (ATROVENT) 0.03 % nasal spray, Place 2 sprays into both nostrils daily., Disp: , Rfl:    Iron-Vitamin C 65-125 MG TABS, Take 1 tablet by mouth every evening., Disp: , Rfl:    levocetirizine (XYZAL) 5 MG tablet, Take 5 mg by mouth every evening., Disp: , Rfl:    Multiple Vitamin (MULTIVITAMIN) tablet, Take 1 tablet by mouth daily., Disp: , Rfl:    rosuvastatin  (CRESTOR ) 5 MG tablet, TAKE 1 TABLET BY MOUTH DAILY, Disp: 90 tablet, Rfl: 3   temazepam  (RESTORIL ) 30 MG capsule, TAKE 1 CAPSULE BY MOUTH AT BEDTIME, Disp: 90 capsule, Rfl: 1   Review of Systems:   Review of Systems  HENT:  Positive for congestion, ear pain and sinus pain. Negative for sore throat.   Respiratory:  Positive for cough and sputum production. Negative for wheezing.     Vitals:   Vitals:   04/27/23 1037  BP: (!) 140/88  Pulse: 60  Temp: (!) 97 F (36.1 C)  SpO2: 95%     There is no height or weight on file to calculate BMI.  Physical Exam:   Physical Exam Vitals and nursing note reviewed.   Constitutional:      General: She is not in acute distress.    Appearance: She is well-developed. She is not ill-appearing or toxic-appearing.  HENT:     Head: Normocephalic and atraumatic.     Right Ear: Tympanic membrane, ear canal and external ear normal. Tympanic membrane is not erythematous, retracted or bulging.  Left Ear: Tympanic membrane, ear canal and external ear normal. Tympanic membrane is not erythematous, retracted or bulging.     Nose:     Right Sinus: Maxillary sinus tenderness present. No frontal sinus tenderness.     Left Sinus: Maxillary sinus tenderness present. No frontal sinus tenderness.     Mouth/Throat:     Pharynx: Uvula midline. No posterior oropharyngeal erythema.  Eyes:     General: Lids are normal.     Conjunctiva/sclera: Conjunctivae normal.  Neck:     Trachea: Trachea normal.  Cardiovascular:     Rate and Rhythm: Normal rate and regular rhythm.     Heart sounds: Normal heart sounds, S1 normal and S2 normal.  Pulmonary:     Effort: Pulmonary effort is normal.     Breath sounds: Normal breath sounds. No decreased breath sounds, wheezing, rhonchi or rales.  Lymphadenopathy:     Cervical: No cervical adenopathy.  Skin:    General: Skin is warm and dry.  Neurological:     Mental Status: She is alert.  Psychiatric:        Speech: Speech normal.        Behavior: Behavior normal. Behavior is cooperative.     Assessment and Plan:   Sinus congestion No red flags on exam.   Will initiate doxycycline  per orders.  Continue allergy medications If still no improvement, recommend that she reach out and we will trial oral prednisone  -- if still no further improvement, will consider CT scan of sinuses vs ENT referral  Discussed taking medications as prescribed.  Reviewed return precautions including new or worsening fever, SOB, new or worsening cough or other concerns.  Push fluids and rest.  I recommend that patient follow-up if symptoms worsen or  persist despite treatment x 7-10 days, sooner if needed.    I,Safa M Kadhim,acting as a scribe for [employer], PA.,have documented all relevant documentation on the behalf of Lucie Buttner, PA,as directed by  Lucie Buttner, PA while in the presence of Lucie Buttner, [place].   I, Lucie Buttner, [place], have reviewed all documentation for this visit. The documentation on 04/27/23 for the exam, diagnosis, procedures, and orders are all accurate and complete.    Lucie Buttner, PA-C

## 2023-05-04 ENCOUNTER — Encounter: Payer: Self-pay | Admitting: Physician Assistant

## 2023-05-05 ENCOUNTER — Other Ambulatory Visit (HOSPITAL_BASED_OUTPATIENT_CLINIC_OR_DEPARTMENT_OTHER): Payer: Self-pay

## 2023-05-05 ENCOUNTER — Other Ambulatory Visit: Payer: Self-pay | Admitting: Physician Assistant

## 2023-05-05 MED ORDER — MONTELUKAST SODIUM 10 MG PO TABS: 10.0000 mg | ORAL_TABLET | Freq: Every day | ORAL | 3 refills | Status: DC

## 2023-05-19 ENCOUNTER — Other Ambulatory Visit: Payer: Self-pay | Admitting: Physician Assistant

## 2023-05-20 NOTE — Telephone Encounter (Signed)
Pt requesting refill for Temazepam 30 mg capsule. Last OV 04/27/2023.

## 2023-05-26 ENCOUNTER — Other Ambulatory Visit: Payer: Self-pay | Admitting: Physician Assistant

## 2023-05-26 DIAGNOSIS — Z1231 Encounter for screening mammogram for malignant neoplasm of breast: Secondary | ICD-10-CM

## 2023-05-28 ENCOUNTER — Other Ambulatory Visit (HOSPITAL_BASED_OUTPATIENT_CLINIC_OR_DEPARTMENT_OTHER): Payer: Self-pay

## 2023-05-28 MED ORDER — CAPVAXIVE 0.5 ML IM SOSY
0.5000 mL | PREFILLED_SYRINGE | Freq: Once | INTRAMUSCULAR | 0 refills | Status: AC
  Filled 2023-05-28: qty 0.5, 1d supply, fill #0

## 2023-06-03 ENCOUNTER — Other Ambulatory Visit: Payer: Self-pay | Admitting: *Deleted

## 2023-06-03 MED ORDER — MONTELUKAST SODIUM 10 MG PO TABS: 10.0000 mg | ORAL_TABLET | Freq: Every day | ORAL | 1 refills | Status: DC

## 2023-06-11 ENCOUNTER — Telehealth: Payer: Self-pay | Admitting: Physician Assistant

## 2023-06-11 NOTE — Telephone Encounter (Signed)
 Received faxed document Surgical Clearance, to be filled out by provider. Patient requested to send it back via Fax Document is located in providers tray at front office.Please advise .

## 2023-06-11 NOTE — Telephone Encounter (Signed)
 Contacted pt and scheduled.

## 2023-06-11 NOTE — Telephone Encounter (Signed)
Pt will need to be seen for Surgical Clearance, she overdue for physical and labs. Please schedule and appointment.

## 2023-06-15 ENCOUNTER — Encounter: Payer: Self-pay | Admitting: Physician Assistant

## 2023-06-15 ENCOUNTER — Ambulatory Visit (INDEPENDENT_AMBULATORY_CARE_PROVIDER_SITE_OTHER): Payer: Medicare Other | Admitting: Physician Assistant

## 2023-06-15 VITALS — BP 122/80 | HR 66 | Temp 97.8°F | Ht 68.0 in | Wt 179.2 lb

## 2023-06-15 DIAGNOSIS — F419 Anxiety disorder, unspecified: Secondary | ICD-10-CM | POA: Diagnosis not present

## 2023-06-15 DIAGNOSIS — M25512 Pain in left shoulder: Secondary | ICD-10-CM | POA: Diagnosis not present

## 2023-06-15 DIAGNOSIS — F32A Depression, unspecified: Secondary | ICD-10-CM

## 2023-06-15 DIAGNOSIS — G8929 Other chronic pain: Secondary | ICD-10-CM

## 2023-06-15 DIAGNOSIS — E785 Hyperlipidemia, unspecified: Secondary | ICD-10-CM

## 2023-06-15 DIAGNOSIS — J4520 Mild intermittent asthma, uncomplicated: Secondary | ICD-10-CM

## 2023-06-15 DIAGNOSIS — Z01818 Encounter for other preprocedural examination: Secondary | ICD-10-CM

## 2023-06-15 DIAGNOSIS — F5101 Primary insomnia: Secondary | ICD-10-CM

## 2023-06-15 LAB — COMPREHENSIVE METABOLIC PANEL
ALT: 29 U/L (ref 0–35)
AST: 24 U/L (ref 0–37)
Albumin: 4.5 g/dL (ref 3.5–5.2)
Alkaline Phosphatase: 47 U/L (ref 39–117)
BUN: 21 mg/dL (ref 6–23)
CO2: 26 meq/L (ref 19–32)
Calcium: 9.4 mg/dL (ref 8.4–10.5)
Chloride: 106 meq/L (ref 96–112)
Creatinine, Ser: 0.86 mg/dL (ref 0.40–1.20)
GFR: 68.41 mL/min (ref >60.00)
Glucose, Bld: 96 mg/dL (ref 70–99)
Potassium: 3.9 meq/L (ref 3.5–5.1)
Sodium: 140 meq/L (ref 135–145)
Total Bilirubin: 0.4 mg/dL (ref 0.2–1.2)
Total Protein: 7.2 g/dL (ref 6.0–8.3)

## 2023-06-15 LAB — LIPID PANEL
Cholesterol: 149 mg/dL (ref 0–200)
HDL: 42.1 mg/dL (ref >39.00)
LDL Cholesterol: 86 mg/dL (ref 0–99)
NonHDL: 107.12
Total CHOL/HDL Ratio: 4
Triglycerides: 107 mg/dL (ref 0.0–149.0)
VLDL: 21.4 mg/dL (ref 0.0–40.0)

## 2023-06-15 LAB — CBC WITH DIFFERENTIAL/PLATELET
Basophils Absolute: 0 10*3/uL (ref 0.0–0.1)
Basophils Relative: 0.9 % (ref 0.0–3.0)
Eosinophils Absolute: 0.1 10*3/uL (ref 0.0–0.7)
Eosinophils Relative: 2.7 % (ref 0.0–5.0)
HCT: 36.1 % (ref 36.0–46.0)
Hemoglobin: 12.2 g/dL (ref 12.0–15.0)
Lymphocytes Relative: 30.3 % (ref 12.0–46.0)
Lymphs Abs: 1.4 10*3/uL (ref 0.7–4.0)
MCHC: 33.8 g/dL (ref 30.0–36.0)
MCV: 85.3 fL (ref 78.0–100.0)
Monocytes Absolute: 0.4 10*3/uL (ref 0.1–1.0)
Monocytes Relative: 7.9 % (ref 3.0–12.0)
Neutro Abs: 2.7 10*3/uL (ref 1.4–7.7)
Neutrophils Relative %: 58.2 % (ref 43.0–77.0)
Platelets: 239 10*3/uL (ref 150.0–400.0)
RBC: 4.23 Mil/uL (ref 3.87–5.11)
RDW: 15 % (ref 11.5–15.5)
WBC: 4.6 10*3/uL (ref 4.0–10.5)

## 2023-06-15 NOTE — Progress Notes (Signed)
 Chelsey Jackson is a 73 y.o. female here for a follow up of a pre-existing problem.  History of Present Illness:   Chief Complaint  Patient presents with   Surgical Clearance   Medical Management of Chronic Issues    Anxiety/depression, Asthma, OSA    HPI  Anxiety/Depression/Insomnia  Does report increased symptoms due to her husband's health complains and having to cancel there trips as a result.  Overall, managed with Wellbutrin XL 300 mg daily. Tolerating this well.  She is taking temazepam 30 mg nightly.  Asthma  Reports compliance and good tolerance of  Singular 10 mg but states that she can't identify any difference while on it. Condition continues to be followed by pulmonology.   Reports undergoing a sleep apnea test. Complaint with Restoril 30 mg daily.   Shoulder Pain  She reports chronic shoulder pain that has persisted for the past 5-6 years. She has tried numerous recommendations and remedies without any relief. She's interested in undergoing a repair surgery to relief her pain. Here for surgical clearance today. She denies any SOB, chest pain, wheezing, leg swelling or other cardiac symptoms.  No further concerns.   HLD Currently taking crestor 5 mg daily Tolerating well and denies significant issues with this medication  Past Medical History:  Diagnosis Date   AC (acromioclavicular) joint bone spurs    left shoulder with chronic pain, w/limitation ROM   Allergy    weekly allergy shots   Anemia    hx of   Anxiety    Arthritis    Asthma    Depression    Fibrocystic breast disease    GERD (gastroesophageal reflux disease)    OSA (obstructive sleep apnea) 07/31/2013   Peptic ulcer disease    TMJ locking      Social History   Tobacco Use   Smoking status: Former    Current packs/day: 0.00    Average packs/day: 2.0 packs/day for 15.0 years (30.0 ttl pk-yrs)    Types: Cigarettes    Start date: 11/20/1974    Quit date: 11/19/1989    Years since  quitting: 33.5   Smokeless tobacco: Never  Vaping Use   Vaping status: Never Used  Substance Use Topics   Alcohol use: Yes    Comment: maybe a glass of wine on the weekend   Drug use: No    Past Surgical History:  Procedure Laterality Date   ABDOMINAL HYSTERECTOMY  2003   due to fibroids   ADENOIDECTOMY     x3   BICEPT TENODESIS Left 03/25/2022   Procedure: BICEPS TENODESIS;  Surgeon: [name], MD;  Location: [facility];  Service: Orthopedics;  Laterality: Left;   BIOPSY  06/29/2018   Procedure: BIOPSY;  Surgeon: Shellia Cleverly, DO;  Location: [facility];  Service: Gastroenterology;;   BREAST CYST ASPIRATION     COLONOSCOPY  03/10/2007   results-normal   COLONOSCOPY WITH PROPOFOL N/A 06/29/2018   Procedure: COLONOSCOPY WITH PROPOFOL;  Surgeon: Shellia Cleverly, DO;  Location: [facility];  Service: Gastroenterology;  Laterality: N/A;   DILATION AND CURETTAGE OF UTERUS     HAMMER TOE SURGERY     POLYPECTOMY  06/29/2018   Procedure: POLYPECTOMY;  Surgeon: Shellia Cleverly, DO;  Location: [facility];  Service: Gastroenterology;;   PUBOVAGINAL SLING  '90's   for incontinence   RHINOPLASTY  1984   ROTATOR CUFF REPAIR Right 2013   [facility]   ROTATOR CUFF REPAIR Left 2012   [facility]  SHOULDER ARTHROSCOPY WITH ROTATOR CUFF REPAIR AND SUBACROMIAL DECOMPRESSION Left 03/25/2022   Procedure: LEFT SHOULDER ARTHROSCOPY, SUBACROMIAL DECOMPRESSION, DEBRIDEMENT, MINI OPEN ROTATOR CUFF TEAR REPAIR;  Surgeon: [name], MD;  Location: [facility];  Service: Orthopedics;  Laterality: Left;   TONSILLECTOMY     x2   TUBAL LIGATION     TURBINATE REDUCTION  1997   WRIST SURGERY      Family History  Problem Relation Age of Onset   Osteoporosis Mother    Fibroids Mother    Other Mother        aortic valve replacement 1994, abdominal aortic aneurysm   Lung cancer Mother 12   Heart disease Father    Testicular cancer Father 33   Prostate cancer Father 64   Leukemia  Father        CLL   Diabetes Maternal Grandmother    Heart disease Maternal Grandfather    Heart disease Paternal Grandmother    Diabetes Paternal Grandfather    Stomach cancer Paternal Grandfather    Breast cancer Paternal Aunt    Breast cancer Cousin    Colon cancer Neg Hx    Throat cancer Neg Hx    Pancreatic cancer Neg Hx     Allergies  Allergen Reactions   Penicillins Anaphylaxis    REACTION: stop breathing    Current Medications:   Current Outpatient Medications:    buPROPion (WELLBUTRIN XL) 300 MG 24 hr tablet, TAKE 1 TABLET BY MOUTH DAILY, Disp: 90 tablet, Rfl: 0   EPINEPHrine 0.3 mg/0.3 mL IJ SOAJ injection, Inject 0.3 mg into the muscle as needed for anaphylaxis., Disp: , Rfl:    fluticasone (FLONASE) 50 MCG/ACT nasal spray, Place 2 sprays into both nostrils at bedtime., Disp: , Rfl:    hydroxypropyl methylcellulose / hypromellose (ISOPTO TEARS / GONIOVISC) 2.5 % ophthalmic solution, Place 1 drop into both eyes as needed for dry eyes., Disp: , Rfl:    ipratropium (ATROVENT) 0.03 % nasal spray, Place 2 sprays into both nostrils daily., Disp: , Rfl:    Iron-Vitamin C 65-125 MG TABS, Take 1 tablet by mouth every evening., Disp: , Rfl:    levocetirizine (XYZAL) 5 MG tablet, Take 5 mg by mouth every evening., Disp: , Rfl:    montelukast (SINGULAIR) 10 MG tablet, Take 1 tablet (10 mg total) by mouth at bedtime., Disp: 90 tablet, Rfl: 1   Multiple Vitamin (MULTIVITAMIN) tablet, Take 1 tablet by mouth daily., Disp: , Rfl:    rosuvastatin (CRESTOR) 5 MG tablet, TAKE 1 TABLET BY MOUTH DAILY, Disp: 90 tablet, Rfl: 3   temazepam (RESTORIL) 30 MG capsule, TAKE 1 CAPSULE BY MOUTH AT BEDTIME, Disp: 90 capsule, Rfl: 0   BREYNA 80-4.5 MCG/ACT inhaler, Inhale 2 puffs into the lungs as needed. (Patient not taking: Reported on 06/15/2023), Disp: , Rfl:    Review of Systems:   Review of Systems  Respiratory:  Negative for shortness of breath and wheezing.   Cardiovascular:  Negative for  chest pain and leg swelling.  Psychiatric/Behavioral:  The patient is nervous/anxious.   Negative unless otherwise specified per HPI.  Vitals:   Vitals:   06/15/23 0821  BP: 122/80  Pulse: 66  Temp: 97.8 F (36.6 C)  TempSrc: Temporal  SpO2: 97%  Weight: 179 lb 4 oz (81.3 kg)  Height: 5\' 8"  (1.727 m)     Body mass index is 27.25 kg/m.  Physical Exam:   Physical Exam Vitals and nursing note reviewed.  Constitutional:  General: She is not in acute distress.    Appearance: She is well-developed. She is not ill-appearing or toxic-appearing.  Cardiovascular:     Rate and Rhythm: Normal rate and regular rhythm.     Pulses: Normal pulses.     Heart sounds: Normal heart sounds, S1 normal and S2 normal.  Pulmonary:     Effort: Pulmonary effort is normal.     Breath sounds: Normal breath sounds.  Skin:    General: Skin is warm and dry.  Neurological:     Mental Status: She is alert.     GCS: GCS eye subscore is 4. GCS verbal subscore is 5. GCS motor subscore is 6.  Psychiatric:        Speech: Speech normal.        Behavior: Behavior normal. Behavior is cooperative.     Assessment and Plan:   Anxiety and depression Ongoing issues with depression/anxiety Seeing therapist and taking Wellbutrin 300 mg xl daily  Follow-up in 6 month(s), sooner if concerns  Hyperlipidemia, mild Update lipid panel and adjust crestor 5 mg daily Follow-up yearly with Dr Cristal Deer  Mild intermittent asthma without complication Overall controlled, per patient Continue to follow-up closely with asthma specialist regarding medications  Chronic left shoulder pain; Preoperative clearance Completing preoperative risk assessment today She is overall a low risk surgical candidate She sees cardiology yearly for her cardiac health and remains compliant with recommendations Last EKG in Aug 2024 reviewed Will complete paperwork today  Primary insomnia Well controlled per patient Continue  temazepam 30 mg daily Follow-up in 6 month(s), sooner if concerns   Jarold Motto, PA-C  I,Safa M Kadhim,acting as a scribe for Jarold Motto, PA.,have documented all relevant documentation on the behalf of Jarold Motto, PA,as directed by  Jarold Motto, PA while in the presence of Jarold Motto, [place].   I, Jarold Motto, [place], have reviewed all documentation for this visit. The documentation on 06/15/23 for the exam, diagnosis, procedures, and orders are all accurate and complete.

## 2023-06-16 ENCOUNTER — Encounter: Payer: Self-pay | Admitting: Physician Assistant

## 2023-06-24 ENCOUNTER — Ambulatory Visit
Admission: RE | Admit: 2023-06-24 | Discharge: 2023-06-24 | Disposition: A | Discharge status: Home / Self Care | Payer: Medicare Other | Source: Ambulatory Visit

## 2023-06-24 DIAGNOSIS — Z1231 Encounter for screening mammogram for malignant neoplasm of breast: Secondary | ICD-10-CM

## 2023-06-25 ENCOUNTER — Encounter: Payer: Self-pay | Admitting: Physician Assistant

## 2023-07-20 ENCOUNTER — Other Ambulatory Visit: Payer: Self-pay | Admitting: Physician Assistant

## 2023-08-12 ENCOUNTER — Other Ambulatory Visit: Payer: Self-pay | Admitting: Physician Assistant

## 2023-08-13 NOTE — Telephone Encounter (Signed)
 Pt requesting Temazepam  30 mg capsule. Last OV 06/15/2023.

## 2023-08-19 ENCOUNTER — Ambulatory Visit: Payer: Medicare Other

## 2023-08-19 VITALS — Ht 67.25 in | Wt 179.0 lb

## 2023-08-19 DIAGNOSIS — Z0000 Encounter for general adult medical examination without abnormal findings: Secondary | ICD-10-CM

## 2023-08-19 NOTE — Patient Instructions (Signed)
 Chelsey Jackson , Thank you for taking time to come for your Medicare Wellness Visit. I appreciate your ongoing commitment to your health goals. Please review the following plan we discussed and let me know if I can assist you in the future.   Referrals/Orders/Follow-Ups/Clinician Recommendations: Each day, aim for 6 glasses of water, plenty of protein in your diet and try to get up and walk/ stretch every hour for 5-10 minutes at a time.  Get back into gym after surgery   This is a list of the screening recommended for you and due dates:  Health Maintenance  Topic Date Due   COVID-19 Vaccine (10 - Pfizer risk 2024-25 season) 06/14/2024*   Flu Shot  11/20/2023   Medicare Annual Wellness Visit  08/18/2024   Mammogram  06/23/2025   DTaP/Tdap/Td vaccine (3 - Td or Tdap) 07/26/2026   Colon Cancer Screening  06/28/2028   Pneumonia Vaccine  Completed   DEXA scan (bone density measurement)  Completed   Hepatitis C Screening  Completed   Zoster (Shingles) Vaccine  Completed   HPV Vaccine  Aged Out   Meningitis B Vaccine  Aged Out  *Topic was postponed. The date shown is not the original due date.    Advanced directives: (In Chart) A copy of your advanced directives are scanned into your chart should your provider ever need it.  Next Medicare Annual Wellness Visit scheduled for next year: Yes

## 2023-08-19 NOTE — Progress Notes (Signed)
 Subjective:   Chelsey Jackson is a 73 y.o. who presents for a Medicare Wellness preventive visit.  Visit Complete: Virtual I connected with  Chelsey Jackson on 08/19/23 by a audio enabled telemedicine application and verified that I am speaking with the correct person using two identifiers.  Patient Location: Home  Provider Location: Home Office  I discussed the limitations of evaluation and management by telemedicine. The patient expressed understanding and agreed to proceed.  Vital Signs: Because this visit was a virtual/telehealth visit, some criteria may be missing or patient reported. Any vitals not documented were not able to be obtained and vitals that have been documented are patient reported.  VideoDeclined- This patient declined Librarian, academic. Therefore the visit was completed with audio only.  Persons Participating in Visit: Patient.  AWV Questionnaire: Yes: Patient Medicare AWV questionnaire was completed by the patient on 08/12/23; I have confirmed that all information answered by patient is correct and no changes since this date.  Cardiac Risk Factors include: advanced age (>64men, >14 women);dyslipidemia     Objective:    Today's Vitals   08/12/23 0856 08/19/23 1001  Weight:  179 lb (81.2 kg)  Height:  5' 7.25" (1.708 m)  PainSc: 7     Body mass index is 27.83 kg/m.     08/19/2023   10:10 AM 07/29/2022    9:08 AM 04/16/2022   12:14 PM 03/07/2022   10:16 AM 07/18/2021   10:08 AM 05/23/2020    9:54 PM 06/29/2018   10:50 AM  Advanced Directives  Does Patient Have a Medical Advance Directive? Yes Yes Yes Yes Yes No Yes  Type of Estate agent of [place];Living will Healthcare Power of [place];Living will Healthcare Power of [employer];Living will Healthcare Power of [place];Living will Healthcare Power of [employer] of [place];Living will  Does patient want to make changes to medical advance  directive? No - Patient declined No - Patient declined  No - Patient declined     Copy of Healthcare Power of Attorney in Chart? Yes - validated most recent copy scanned in chart (See row information) Yes - validated most recent copy scanned in chart (See row information)  Yes - validated most recent copy scanned in chart (See row information) Yes - validated most recent copy scanned in chart (See row information)  No - copy requested  Would patient like information on creating a medical advance directive?      No - Patient declined     Current Medications (verified) Outpatient Encounter Medications as of 08/19/2023  Medication Sig   buPROPion  (WELLBUTRIN  XL) 300 MG 24 hr tablet TAKE 1 TABLET BY MOUTH DAILY   fluticasone (FLONASE) 50 MCG/ACT nasal spray Place 2 sprays into both nostrils at bedtime.   hydroxypropyl methylcellulose / hypromellose (ISOPTO TEARS / GONIOVISC) 2.5 % ophthalmic solution Place 1 drop into both eyes as needed for dry eyes.   ipratropium (ATROVENT) 0.03 % nasal spray Place 2 sprays into both nostrils daily.   Iron-Vitamin C 65-125 MG TABS Take 1 tablet by mouth every evening.   levocetirizine (XYZAL) 5 MG tablet Take 5 mg by mouth every evening.   montelukast  (SINGULAIR ) 10 MG tablet Take 1 tablet (10 mg total) by mouth at bedtime.   Multiple Vitamin (MULTIVITAMIN) tablet Take 1 tablet by mouth daily.   rosuvastatin  (CRESTOR ) 5 MG tablet TAKE 1 TABLET BY MOUTH DAILY   temazepam  (RESTORIL ) 30 MG capsule TAKE 1 CAPSULE BY MOUTH  AT BEDTIME   BREYNA 80-4.5 MCG/ACT inhaler Inhale 2 puffs into the lungs as needed. (Patient not taking: Reported on 08/19/2023)   EPINEPHrine  0.3 mg/0.3 mL IJ SOAJ injection Inject 0.3 mg into the muscle as needed for anaphylaxis. On hand   oxyCODONE  (OXY IR/ROXICODONE ) 5 MG immediate release tablet Take by mouth. (Patient not taking: Reported on 08/19/2023)   tiZANidine (ZANAFLEX) 4 MG tablet Take 4 mg by mouth 3 (three) times daily. (Patient not  taking: Reported on 08/19/2023)   No facility-administered encounter medications on file as of 08/19/2023.    Allergies (verified) Penicillins   History: Past Medical History:  Diagnosis Date   AC (acromioclavicular) joint bone spurs    left shoulder with chronic pain, w/limitation ROM   Allergy    weekly allergy shots   Anemia    hx of   Anxiety    Arthritis    Asthma    Depression    Fibrocystic breast disease    GERD (gastroesophageal reflux disease)    OSA (obstructive sleep apnea) 07/31/2013   Peptic ulcer disease    TMJ locking    Past Surgical History:  Procedure Laterality Date   ABDOMINAL HYSTERECTOMY  2003   due to fibroids   ADENOIDECTOMY     x3   BICEPT TENODESIS Left 03/25/2022   Procedure: BICEPS TENODESIS;  Surgeon: Jasmine Mesi, MD;  Location: [facility] OR;  Service: Orthopedics;  Laterality: Left;   BIOPSY  06/29/2018   Procedure: BIOPSY;  Surgeon: Annis Kinder, DO;  Location: [facility];  Service: Gastroenterology;;   BREAST CYST ASPIRATION     COLONOSCOPY  03/10/2007   results-normal   COLONOSCOPY WITH PROPOFOL  N/A 06/29/2018   Procedure: COLONOSCOPY WITH PROPOFOL ;  Surgeon: Annis Kinder, DO;  Location: [facility];  Service: Gastroenterology;  Laterality: N/A;   DILATION AND CURETTAGE OF UTERUS     HAMMER TOE SURGERY     POLYPECTOMY  06/29/2018   Procedure: POLYPECTOMY;  Surgeon: Annis Kinder, DO;  Location: [facility];  Service: Gastroenterology;;   PUBOVAGINAL SLING  '90's   for incontinence   RHINOPLASTY  1984   ROTATOR CUFF REPAIR Right 2013   [place]   ROTATOR CUFF REPAIR Left 2012   [place]   SHOULDER ARTHROSCOPY WITH ROTATOR CUFF REPAIR AND SUBACROMIAL DECOMPRESSION Left 03/25/2022   Procedure: LEFT SHOULDER ARTHROSCOPY, SUBACROMIAL DECOMPRESSION, DEBRIDEMENT, MINI OPEN ROTATOR CUFF TEAR REPAIR;  Surgeon: Jasmine Mesi, MD;  Location: [facility] OR;  Service: Orthopedics;  Laterality: Left;   TONSILLECTOMY     x2    TUBAL LIGATION     TURBINATE REDUCTION  1997   WRIST SURGERY     Family History  Problem Relation Age of Onset   Osteoporosis Mother    Fibroids Mother    Other Mother        aortic valve replacement 1994, abdominal aortic aneurysm   Lung cancer Mother 68   Heart disease Father    Testicular cancer Father 66   Prostate cancer Father 74   Leukemia Father        CLL   Diabetes Maternal Grandmother    Heart disease Maternal Grandfather    Heart disease Paternal Grandmother    Diabetes Paternal Grandfather    Stomach cancer Paternal Grandfather    Breast cancer Paternal Aunt    Breast cancer Cousin    Colon cancer Neg Hx    Throat cancer Neg Hx    Pancreatic cancer Neg Hx  Social History   Socioeconomic History   Marital status: Married    Spouse name: Not on file   Number of children: 2   Years of education: Not on file   Highest education level: Some college, no degree  Occupational History   Not on file  Tobacco Use   Smoking status: Former    Current packs/day: 0.00    Average packs/day: 2.0 packs/day for 15.0 years (30.0 ttl pk-yrs)    Types: Cigarettes    Start date: 11/20/1974    Quit date: 11/19/1989    Years since quitting: 33.7   Smokeless tobacco: Never  Vaping Use   Vaping status: Never Used  Substance and Sexual Activity   Alcohol use: Yes    Comment: maybe a glass of wine on the weekend   Drug use: No   Sexual activity: Not on file  Other Topics Concern   Not on file  Social History Narrative   [employer] 2 years   Marries '822 daughters '47 '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    Social Drivers of Corporate investment banker Strain: Low Risk  (08/19/2023)   Overall Financial Resource Strain (CARDIA)    Difficulty of Paying Living Expenses: Not hard at all  Food Insecurity: No Food Insecurity (08/19/2023)   Hunger Vital Sign    Worried About Running Out of Food in the Last Year: Never true    Ran Out of Food in the Last Year:  Never true  Transportation Needs: No Transportation Needs (08/19/2023)   PRAPARE - Administrator, Civil Service (Medical): No    Lack of Transportation (Non-Medical): No  Physical Activity: Inactive (08/19/2023)   Exercise Vital Sign    Days of Exercise per Week: 0 days    Minutes of Exercise per Session: 0 min  Stress: Stress Concern Present (08/19/2023)   [employer] of Occupational Health - Occupational Stress Questionnaire    Feeling of Stress : To some extent  Social Connections: Socially Integrated (08/19/2023)   Social Connection and Isolation Panel [NHANES]    Frequency of Communication with Friends and Family: Three times a week    Frequency of Social Gatherings with Friends and Family: Three times a week    Attends Religious Services: More than 4 times per year    Active Member of Clubs or Organizations: Yes    Attends Banker Meetings: 1 to 4 times per year    Marital Status: Married    Tobacco Counseling Counseling given: Not Answered    Clinical Intake:  Pre-visit preparation completed: Yes  Pain : 0-10 Pain Score: 7  Pain Type: Chronic pain Pain Location: Shoulder Pain Orientation: Left Pain Descriptors / Indicators: Aching Pain Onset: More than a month ago Pain Frequency: Intermittent     BMI - recorded: 27.83 Nutritional Status: BMI 25 -29 Overweight Diabetes: No  Lab Results  Component Value Date   HGBA1C 5.3 02/20/2022   HGBA1C 5.3 04/23/2017     How often do you need to have someone help you when you read instructions, pamphlets, or other written materials from your doctor or pharmacy?: 1 - Never  Interpreter Needed?: No  Information entered by :: Lamont Pilsner, LPN   Activities of Daily Living      08/12/2023    8:56 AM  In your present state of health, do you have any difficulty performing the following activities:  Hearing? 0  Vision? 0  Difficulty concentrating or making decisions? 0  Walking or  climbing stairs? 0  Dressing or bathing? 0  Doing errands, shopping? 0  Preparing Food and eating ? N  Using the Toilet? N  In the past six months, have you accidently leaked urine? Y  Comment wears pads  Do you have problems with loss of bowel control? N  Managing your Medications? N  Managing your Finances? N  Housekeeping or managing your Housekeeping? N    Patient Care Team: Alexander Iba, [place] as PCP - General (Physician Assistant) Sheryle Donning, MD as PCP - Cardiology (Cardiology) Terri Fester, MD as Attending Physician (Obstetrics and Gynecology) Shirlee Dotter, MD (Inactive) (Orthopedic Surgery) Seabron Cypress, Timmothy Foots, MD (Allergy and Immunology) Alba Ally, MD (Psychiatry)  Indicate any recent Medical Services you may have received from other than [facility] providers in the past year (date may be approximate).     Assessment:   This is a routine wellness examination for Salice.  Hearing/Vision screen Hearing Screening - Comments:: Pt denies any hearing issues  Vision Screening - Comments:: Wears rx glasses - up to date with routine eye exams with Dr Cindra Cree    Goals Addressed             This Visit's Progress    Patient Stated       Get back to the gym after surgery        Depression Screen     08/19/2023   10:08 AM 06/15/2023    8:28 AM 08/26/2022   11:39 AM 07/29/2022    9:05 AM 07/18/2021   10:06 AM 05/21/2021   11:09 AM 05/17/2020   12:44 PM  PHQ 2/9 Scores  PHQ - 2 Score 2 2 3 3  0 4 3  PHQ- 9 Score 4 4 9 5  7 13     Fall Risk     08/12/2023    8:56 AM 07/29/2022    9:08 AM 07/17/2022   12:46 PM 07/18/2021   10:08 AM 05/21/2021   11:09 AM  Fall Risk   Falls in the past year? 0 0 0 0 0  Number falls in past yr: 0 0 0 0 0  Injury with Fall? 0 0 0 0 0  Risk for fall due to : No Fall Risks Impaired vision;Impaired mobility  Impaired vision   Risk for fall due to: Comment  related to shoulder     Follow up Falls prevention  discussed Falls prevention discussed  Falls prevention discussed Falls evaluation completed    MEDICARE RISK AT HOME:  Medicare Risk at Home Any stairs in or around the home?: (Patient-Rptd) Yes If so, are there any without handrails?: (Patient-Rptd) No Home free of loose throw rugs in walkways, pet beds, electrical cords, etc?: (Patient-Rptd) Yes Adequate lighting in your home to reduce risk of falls?: (Patient-Rptd) Yes Life alert?: (Patient-Rptd) No Use of a cane, walker or w/c?: (Patient-Rptd) No Grab bars in the bathroom?: (Patient-Rptd) Yes Shower chair or bench in shower?: (Patient-Rptd) Yes Elevated toilet seat or a handicapped toilet?: (Patient-Rptd) Yes  TIMED UP AND GO:  Was the test performed?  No  Cognitive Function: 6CIT completed        08/19/2023   10:11 AM 07/29/2022    9:11 AM 07/18/2021   10:10 AM  6CIT Screen  What Year? 0 points 0 points 0 points  What month? 0 points 0 points 0 points  What time? 0 points 0 points 0 points  Count back from 20 0 points 0 points  0 points  Months in reverse 0 points 0 points 0 points  Repeat phrase 0 points 0 points 0 points  Total Score 0 points 0 points 0 points    Immunizations Immunization History  Administered Date(s) Administered   Fluad Quad(high Dose 65+) 01/11/2020, 01/29/2021, 11/26/2021   Fluad Trivalent(High Dose 65+) 01/20/2023   Influenza Whole 01/20/2008, 02/02/2009, 01/10/2010   Influenza, High Dose Seasonal PF 02/28/2016, 02/18/2017, 02/24/2018, 12/24/2018, 06/20/2019, 06/19/2020   Influenza,inj,Quad PF,6+ Mos 01/06/2015, 01/08/2016, 01/19/2017, 02/18/2023   Influenza,inj,quad, With Preservative 01/06/2015   Influenza-Unspecified 01/20/2012, 01/06/2013, 01/16/2014, 01/01/2018, 12/24/2018   PFIZER(Purple Top)SARS-COV-2 Vaccination 05/12/2019, 06/02/2019, 12/13/2019, 06/19/2020, 08/08/2020   PNEUMOCOCCAL CONJUGATE-20 05/28/2023   Pfizer Covid-19 Vaccine Bivalent Booster 30yrs & up 04/02/2021, 11/26/2021    Pfizer(Comirnaty )Fall Seasonal Vaccine 12 years and older 05/15/2022, 01/20/2023   Pneumococcal Conjugate Pcv21, Polysaccharide Crm197 Conjugaf 05/28/2023   Pneumococcal Conjugate-13 11/05/2018   Pneumococcal Polysaccharide-23 07/29/2013, 02/28/2016, 02/18/2017, 02/24/2018, 06/20/2019, 01/11/2020, 06/19/2020, 02/18/2023   Respiratory Syncytial Virus Vaccine,Recomb Aduvanted(Arexvy) 01/09/2022   Td 03/26/2009   Tdap 07/25/2016   Zoster Recombinant(Shingrix) 12/04/2016, 03/16/2017   Zoster, Live 08/11/2008    Screening Tests Health Maintenance  Topic Date Due   COVID-19 Vaccine (10 - Pfizer risk 2024-25 season) 06/14/2024 (Originally 07/21/2023)   INFLUENZA VACCINE  11/20/2023   Medicare Annual Wellness (AWV)  08/18/2024   MAMMOGRAM  06/23/2025   DTaP/Tdap/Td (3 - Td or Tdap) 07/26/2026   Colonoscopy  06/28/2028   Pneumonia Vaccine 88+ Years old  Completed   DEXA SCAN  Completed   Hepatitis C Screening  Completed   Zoster Vaccines- Shingrix  Completed   HPV VACCINES  Aged Out   Meningococcal B Vaccine  Aged Out    Health Maintenance  There are no preventive care reminders to display for this patient.  Health Maintenance Items Addressed: See Nurse Notes  Additional Screening:  Vision Screening: Recommended annual ophthalmology exams for early detection of glaucoma and other disorders of the eye.  Dental Screening: Recommended annual dental exams for proper oral hygiene  Community Resource Referral / Chronic Care Management: CRR required this visit?  No   CCM required this visit?  No     Plan:     I have personally reviewed and noted the following in the patient's chart:   Medical and social history Use of alcohol, tobacco or illicit drugs  Current medications and supplements including opioid prescriptions. Patient is not currently taking opioid prescriptions. Functional ability and status Nutritional status Physical activity Advanced directives List of other  physicians Hospitalizations, surgeries, and ER visits in previous 12 months Vitals Screenings to include cognitive, depression, and falls Referrals and appointments  In addition, I have reviewed and discussed with patient certain preventive protocols, quality metrics, and best practice recommendations. A written personalized care plan for preventive services as well as general preventive health recommendations were provided to patient.     Bruno Capri, LPN   0/98/1191   After Visit Summary: (MyChart) Due to this being a telephonic visit, the after visit summary with patients personalized plan was offered to patient via MyChart   Notes: Nothing significant to report at this time.

## 2023-08-20 ENCOUNTER — Encounter: Payer: Self-pay | Admitting: Physician Assistant

## 2023-10-08 ENCOUNTER — Other Ambulatory Visit: Payer: Self-pay

## 2023-10-08 ENCOUNTER — Telehealth: Payer: Self-pay | Admitting: Gastroenterology

## 2023-10-08 DIAGNOSIS — Z1211 Encounter for screening for malignant neoplasm of colon: Secondary | ICD-10-CM

## 2023-10-08 NOTE — Telephone Encounter (Signed)
 Patient called and stated that she needs to schedule a colonoscopy and it needs to be schedule at the hospital due to her having a small mouth and not having good breathing equipment here at our office. Patient is requesting a call back. Please advise.

## 2023-10-08 NOTE — Telephone Encounter (Signed)
 Reviewed chart - patient is overdue for screening colon. No GI changes or concerns at this time. Scheduled colon with patient for 12/14/23 at 8:15 am at [facility] with Dr. Karene Oto. PV is scheduled by phone for 11/18/23 at 10:00 am. Denies any blood thinners/diabetic medications. Pt had no further questions.

## 2023-10-15 ENCOUNTER — Other Ambulatory Visit: Payer: Self-pay | Admitting: Physician Assistant

## 2023-11-13 ENCOUNTER — Other Ambulatory Visit: Payer: Self-pay | Admitting: Physician Assistant

## 2023-11-13 NOTE — Telephone Encounter (Signed)
 Pt requesting refill for Temazepam  30 mg capsule. Last OV 06/15/2023.

## 2023-11-15 ENCOUNTER — Other Ambulatory Visit: Payer: Self-pay | Admitting: Medical Genetics

## 2023-11-18 ENCOUNTER — Encounter

## 2023-11-18 ENCOUNTER — Ambulatory Visit (AMBULATORY_SURGERY_CENTER)

## 2023-11-18 VITALS — Ht 67.25 in | Wt 185.0 lb

## 2023-11-18 DIAGNOSIS — Z860100 Personal history of colon polyps, unspecified: Secondary | ICD-10-CM

## 2023-11-18 MED ORDER — NA SULFATE-K SULFATE-MG SULF 17.5-3.13-1.6 GM/177ML PO SOLN: 1.0000 | Freq: Once | ORAL | 0 refills | Status: AC

## 2023-11-18 NOTE — Progress Notes (Signed)

## 2023-11-19 ENCOUNTER — Other Ambulatory Visit

## 2023-11-19 DIAGNOSIS — Z006 Encounter for examination for normal comparison and control in clinical research program: Secondary | ICD-10-CM

## 2023-12-02 ENCOUNTER — Other Ambulatory Visit: Payer: Self-pay

## 2023-12-02 ENCOUNTER — Encounter (HOSPITAL_COMMUNITY): Payer: Self-pay | Admitting: Gastroenterology

## 2023-12-02 NOTE — Progress Notes (Signed)
 Patient states she has been told she has a small mouth/opening and is a risk for difficult intubation. She has not had any problems in the past.  COVID Vaccine Completed:  Date of COVID positive in last 90 days:  PCP - Lucie Buttner, PA Cardiologist -  Shelda Bruckner, MD Pulmonologist- Carolynne Allan, MD LOV 2022  Chest x-ray - n/a EKG - 12/15/22 Epic- PVCs Stress Test - n/a ECHO - n/a Cardiac Cath - n/a Pacemaker/ICD device last checked: n/a Spinal Cord Stimulator: n/a  Bowel Prep - has instructions, miralax, clears days before  Sleep Study - yes CPAP - no, uses oral device  Fasting Blood Sugar - n/a Checks Blood Sugar _____ times a day  Last dose of GLP1 agonist-  N/A GLP1 instructions:  Do not take after     Last dose of SGLT-2 inhibitors-  N/A SGLT-2 instructions:  Do not take after     Blood Thinner Instructions:  Last dose: n/a  Time: Aspirin Instructions: Last Dose:  Activity level: Can go up a flight of stairs and perform activities of daily living without stopping and without symptoms of chest pain or shortness of breath.  Anesthesia review:   Patient denies shortness of breath, fever, cough and chest pain at PAT appointment  Patient verbalized understanding of instructions that were given to them at the PAT appointment. Patient was also instructed that they will need to review over the PAT instructions again at home before surgery.

## 2023-12-04 LAB — GENECONNECT MOLECULAR SCREEN: Genetic Analysis Overall Interpretation: NEGATIVE

## 2023-12-07 ENCOUNTER — Telehealth: Payer: Self-pay | Admitting: Gastroenterology

## 2023-12-07 NOTE — Telephone Encounter (Signed)
 Patient called and stated that she is aware that she has a colonoscopy procedure schedule for August the 25 th at the [facility]. Patient is also aware of her arrival time being at 6:45 am. Patient does not have any concerns at this time. Patient does have her prep medication and her husband is her care partner and her driver for that day. Please advise.

## 2023-12-07 NOTE — Telephone Encounter (Addendum)
 Procedure:Colonoscopy Procedure date: 12/14/23 Procedure location: [facility] Arrival Time: 6:45 am Spoke with the patient Y/N:   No, I left a detailed message on [phone] on 12/07/23 @ 9:54 am for the patient to return call    Any prep concerns? ___  Has the patient obtained the prep from the pharmacy ? ___ Do you have a care partner and transportation: ___ Any additional concerns? ___

## 2023-12-07 NOTE — Telephone Encounter (Signed)
 Noted! Thank you

## 2023-12-14 ENCOUNTER — Ambulatory Visit (HOSPITAL_BASED_OUTPATIENT_CLINIC_OR_DEPARTMENT_OTHER): Payer: Self-pay | Admitting: Medical

## 2023-12-14 ENCOUNTER — Other Ambulatory Visit: Payer: Self-pay

## 2023-12-14 ENCOUNTER — Encounter (HOSPITAL_COMMUNITY): Payer: Self-pay | Admitting: Gastroenterology

## 2023-12-14 ENCOUNTER — Ambulatory Visit (HOSPITAL_COMMUNITY): Payer: Self-pay | Admitting: Medical

## 2023-12-14 ENCOUNTER — Ambulatory Visit (HOSPITAL_COMMUNITY)
Admission: RE | Admit: 2023-12-14 | Discharge: 2023-12-14 | Disposition: A | Discharge status: Home / Self Care | Attending: Gastroenterology | Admitting: Gastroenterology

## 2023-12-14 ENCOUNTER — Encounter (HOSPITAL_COMMUNITY)
Admission: RE | Disposition: A | Discharge status: Home / Self Care | Payer: Self-pay | Source: Home / Self Care | Attending: Gastroenterology

## 2023-12-14 DIAGNOSIS — K621 Rectal polyp: Secondary | ICD-10-CM | POA: Diagnosis not present

## 2023-12-14 DIAGNOSIS — F418 Other specified anxiety disorders: Secondary | ICD-10-CM | POA: Insufficient documentation

## 2023-12-14 DIAGNOSIS — J45909 Unspecified asthma, uncomplicated: Secondary | ICD-10-CM | POA: Diagnosis not present

## 2023-12-14 DIAGNOSIS — D123 Benign neoplasm of transverse colon: Secondary | ICD-10-CM

## 2023-12-14 DIAGNOSIS — Z860100 Personal history of colon polyps, unspecified: Secondary | ICD-10-CM

## 2023-12-14 DIAGNOSIS — K644 Residual hemorrhoidal skin tags: Secondary | ICD-10-CM | POA: Diagnosis not present

## 2023-12-14 DIAGNOSIS — Z8711 Personal history of peptic ulcer disease: Secondary | ICD-10-CM | POA: Diagnosis not present

## 2023-12-14 DIAGNOSIS — M19012 Primary osteoarthritis, left shoulder: Secondary | ICD-10-CM | POA: Insufficient documentation

## 2023-12-14 DIAGNOSIS — K635 Polyp of colon: Secondary | ICD-10-CM | POA: Insufficient documentation

## 2023-12-14 DIAGNOSIS — G4733 Obstructive sleep apnea (adult) (pediatric): Secondary | ICD-10-CM | POA: Insufficient documentation

## 2023-12-14 DIAGNOSIS — Z860101 Personal history of adenomatous and serrated colon polyps: Secondary | ICD-10-CM | POA: Diagnosis present

## 2023-12-14 DIAGNOSIS — Z1211 Encounter for screening for malignant neoplasm of colon: Secondary | ICD-10-CM

## 2023-12-14 DIAGNOSIS — K5730 Diverticulosis of large intestine without perforation or abscess without bleeding: Secondary | ICD-10-CM | POA: Diagnosis not present

## 2023-12-14 DIAGNOSIS — D175 Benign lipomatous neoplasm of intra-abdominal organs: Secondary | ICD-10-CM | POA: Diagnosis not present

## 2023-12-14 DIAGNOSIS — K219 Gastro-esophageal reflux disease without esophagitis: Secondary | ICD-10-CM | POA: Diagnosis not present

## 2023-12-14 DIAGNOSIS — Z87891 Personal history of nicotine dependence: Secondary | ICD-10-CM | POA: Insufficient documentation

## 2023-12-14 HISTORY — PX: COLONOSCOPY: SHX5424

## 2023-12-14 SURGERY — COLONOSCOPY
Anesthesia: Monitor Anesthesia Care

## 2023-12-14 MED ORDER — PROPOFOL 1000 MG/100ML IV EMUL
INTRAVENOUS | Status: AC
  Filled 2023-12-14: qty 100

## 2023-12-14 MED ORDER — SODIUM CHLORIDE 0.9 % IV SOLN: INTRAVENOUS | Status: DC

## 2023-12-14 MED ORDER — LIDOCAINE 2% (20 MG/ML) 5 ML SYRINGE
INTRAMUSCULAR | Status: DC | PRN
  Administered 2023-12-14: 50 mg via INTRAVENOUS

## 2023-12-14 MED ORDER — SODIUM CHLORIDE 0.9 % IV SOLN
INTRAVENOUS | Status: AC | PRN
  Administered 2023-12-14: 500 mL via INTRAMUSCULAR

## 2023-12-14 MED ORDER — PROPOFOL 500 MG/50ML IV EMUL
INTRAVENOUS | Status: AC
  Filled 2023-12-14: qty 50

## 2023-12-14 MED ORDER — PROPOFOL 10 MG/ML IV BOLUS
INTRAVENOUS | Status: DC | PRN
  Administered 2023-12-14: 30 mg via INTRAVENOUS

## 2023-12-14 MED ORDER — PROPOFOL 500 MG/50ML IV EMUL
INTRAVENOUS | Status: DC | PRN
  Administered 2023-12-14: 150 ug/kg/min via INTRAVENOUS

## 2023-12-14 NOTE — Anesthesia Procedure Notes (Signed)
 Procedure Name: MAC Date/Time: 12/14/2023 7:41 AM  Performed by: Judythe Tanda Aran, CRNAPre-anesthesia Checklist: Patient identified, Emergency Drugs available, Suction available and Patient being monitored Patient Re-evaluated:Patient Re-evaluated prior to induction Oxygen Delivery Method: Simple face mask

## 2023-12-14 NOTE — Transfer of Care (Signed)
 Immediate Anesthesia Transfer of Care Note  Patient: Chelsey Jackson  Procedure(s) Performed: COLONOSCOPY  Patient Location: PACU  Anesthesia Type:MAC  Level of Consciousness: awake and alert   Airway & Oxygen Therapy: Patient Spontanous Breathing and Patient connected to nasal cannula oxygen  Post-op Assessment: Report given to RN  Post vital signs: Reviewed and stable  Last Vitals:  Vitals Value Taken Time  BP 105/53 12/14/23 08:10  Temp    Pulse 63 12/14/23 08:11  Resp 16 12/14/23 08:11  SpO2 99 % 12/14/23 08:11  Vitals shown include unfiled device data.  Last Pain:  Vitals:   12/14/23 0649  TempSrc: Temporal  PainSc: 0-No pain         Complications: No notable events documented.

## 2023-12-14 NOTE — Anesthesia Preprocedure Evaluation (Signed)
 Anesthesia Evaluation  Patient identified by MRN, date of birth, ID band Patient awake    Reviewed: Allergy & Precautions, NPO status , Patient's Chart, lab work & pertinent test results  History of Anesthesia Complications (+) DIFFICULT AIRWAY and history of anesthetic complications (EZ mask ventilation but difficutly with glidescope intubation)  Airway Mallampati: III  TM Distance: <3 FB Neck ROM: Full  Mouth opening: Limited Mouth Opening  Dental no notable dental hx.    Pulmonary asthma , sleep apnea , former smoker   Pulmonary exam normal        Cardiovascular negative cardio ROS Normal cardiovascular exam     Neuro/Psych   Anxiety Depression       GI/Hepatic Neg liver ROS, PUD,GERD  Controlled,,  Endo/Other  negative endocrine ROS    Renal/GU negative Renal ROS  negative genitourinary   Musculoskeletal  (+) Arthritis ,  left shoulder biceps tendinitis, rotator cuff tear   Abdominal   Peds  Hematology negative hematology ROS (+)   Anesthesia Other Findings Day of surgery medications reviewed with patient.  Reproductive/Obstetrics negative OB ROS                              Anesthesia Physical Anesthesia Plan  ASA: 2  Anesthesia Plan: MAC   Post-op Pain Management:    Induction:   PONV Risk Score and Plan: 3 and Propofol  infusion  Airway Management Planned: Natural Airway and Simple Face Mask  Additional Equipment: None  Intra-op Plan:   Post-operative Plan:   Informed Consent: I have reviewed the patients History and Physical, chart, labs and discussed the procedure including the risks, benefits and alternatives for the proposed anesthesia with the patient or authorized representative who has indicated his/her understanding and acceptance.     Dental advisory given  Plan Discussed with: CRNA  Anesthesia Plan Comments: (PAT note written by Allison Zelenak, PA-C.  Reported limited mouth opening. No difficult intubation in 2003.   )        Anesthesia Quick Evaluation

## 2023-12-14 NOTE — H&P (Signed)
 GASTROENTEROLOGY PROCEDURE H&P NOTE   Primary Care Physician: Job Lukes, PA    Reason for Procedure:  Colon polyp surveillance  Plan:    Colonoscopy  Patient is appropriate for endoscopic procedure(s) in the hospital endoscopy unit.  The nature of the procedure, as well as the risks, benefits, and alternatives were carefully and thoroughly reviewed with the patient. Ample time for discussion and questions allowed. The patient understood, was satisfied, and agreed to proceed.     HPI: Chelsey Jackson is a 73 y.o. female who presents for colonoscopy for ongoing colon polyp surveillance and colon cancer screening.  No active GI symptoms.  No known family history of colon cancer or related malignancy.  Patient is otherwise without complaints or active issues today.  Last colonoscopy was 06/2018 and notable for 6 subcentimeter adenomas and sessile serrated polyps, 2 small rectal hyperplastic polyps, single sigmoid diverticulum, and recommended repeat in 3 years.  Procedure schedule at [facility] Endoscopy unit due to small mouth and potential airway issue.  Past Medical History:  Diagnosis Date   AC (acromioclavicular) joint bone spurs    left shoulder with chronic pain, w/limitation ROM   Allergy    weekly allergy shots   Anemia    hx of   Anxiety    Arthritis    Asthma    Depression    Fibrocystic breast disease    GERD (gastroesophageal reflux disease)    OSA (obstructive sleep apnea) 07/31/2013   Peptic ulcer disease    TMJ locking     Past Surgical History:  Procedure Laterality Date   ABDOMINAL HYSTERECTOMY  2003   due to fibroids   ADENOIDECTOMY     x3   BICEPT TENODESIS Left 03/25/2022   Procedure: BICEPS TENODESIS;  Surgeon: Addie Cordella Hamilton, MD;  Location: [facility] OR;  Service: Orthopedics;  Laterality: Left;   BIOPSY  06/29/2018   Procedure: BIOPSY;  Surgeon: San Sandor GAILS, DO;  Location: [facility] ENDOSCOPY;  Service: Gastroenterology;;   BREAST  CYST ASPIRATION     COLONOSCOPY  03/10/2007   results-normal   COLONOSCOPY WITH PROPOFOL  N/A 06/29/2018   Procedure: COLONOSCOPY WITH PROPOFOL ;  Surgeon: San Sandor GAILS, DO;  Location: [facility] ENDOSCOPY;  Service: Gastroenterology;  Laterality: N/A;   DILATION AND CURETTAGE OF UTERUS     HAMMER TOE SURGERY     POLYPECTOMY  06/29/2018   Procedure: POLYPECTOMY;  Surgeon: San Sandor GAILS, DO;  Location: [facility] ENDOSCOPY;  Service: Gastroenterology;;   PUBOVAGINAL SLING  '90's   for incontinence   RHINOPLASTY  1984   ROTATOR CUFF REPAIR Right 2013   [place]   ROTATOR CUFF REPAIR Left 2012   [place]   SHOULDER ARTHROSCOPY WITH ROTATOR CUFF REPAIR AND SUBACROMIAL DECOMPRESSION Left 03/25/2022   Procedure: LEFT SHOULDER ARTHROSCOPY, SUBACROMIAL DECOMPRESSION, DEBRIDEMENT, MINI OPEN ROTATOR CUFF TEAR REPAIR;  Surgeon: Addie Cordella Hamilton, MD;  Location: [facility] OR;  Service: Orthopedics;  Laterality: Left;   TONSILLECTOMY     x2   TUBAL LIGATION     TURBINATE REDUCTION  1997   WRIST SURGERY      Prior to Admission medications   Medication Sig Start Date End Date Taking? Authorizing Provider  buPROPion  (WELLBUTRIN  XL) 300 MG 24 hr tablet TAKE 1 TABLET BY MOUTH DAILY 10/15/23  Yes Worley, Samantha, PA  fluticasone (FLONASE) 50 MCG/ACT nasal spray Place 2 sprays into both nostrils at bedtime. 06/12/14  Yes [provider]  hydroxypropyl methylcellulose / hypromellose (ISOPTO TEARS /  GONIOVISC) 2.5 % ophthalmic solution Place 1 drop into both eyes as needed for dry eyes.   Yes [provider]  ipratropium (ATROVENT) 0.03 % nasal spray Place 2 sprays into both nostrils daily.   Yes [provider]  Iron-Vitamin C 65-125 MG TABS Take 1 tablet by mouth every evening. 04/21/12  Yes [provider]  levocetirizine (XYZAL) 5 MG tablet Take 5 mg by mouth every evening.   Yes [provider]  montelukast  (SINGULAIR ) 10 MG tablet Take 1 tablet (10 mg total) by mouth  at bedtime. 06/03/23  Yes Job Lukes, PA  Multiple Vitamin (MULTIVITAMIN) tablet Take 1 tablet by mouth daily.   Yes [provider]  Allie Gutta Calcium  500 MG TABS Take 1 tablet by mouth daily. 02/24/18  Yes [provider]  rosuvastatin  (CRESTOR ) 5 MG tablet TAKE 1 TABLET BY MOUTH DAILY 01/06/23  Yes Lonni Slain, MD  temazepam  (RESTORIL ) 30 MG capsule TAKE 1 CAPSULE BY MOUTH AT BEDTIME 11/13/23  Yes Webb, Padonda B, FNP  BREYNA 80-4.5 MCG/ACT inhaler Inhale 2 puffs into the lungs as needed. 10/12/22   [provider]  EPINEPHrine  0.3 mg/0.3 mL IJ SOAJ injection Inject 0.3 mg into the muscle as needed for anaphylaxis. On hand    [provider]  SPRAVATO, 84 MG DOSE, 28 MG/DEVICE SOPK Place 1 spray into the nose 2 (two) times a week. 11/13/23   [provider]    Current Facility-Administered Medications  Medication Dose Route Frequency Provider Last Rate Last Admin   0.9 %  sodium chloride  infusion   Intravenous Continuous Tylor Courtwright V, DO       0.9 %  sodium chloride  infusion    Continuous PRN Jonathan Kirkendoll V, DO 10 mL/hr at 12/14/23 0653 500 mL at 12/14/23 0653    Allergies as of 10/08/2023 - Review Complete 08/19/2023  Allergen Reaction Noted   Penicillins Anaphylaxis 04/06/2016    Family History  Problem Relation Age of Onset   Osteoporosis Mother    Fibroids Mother    Other Mother        aortic valve replacement 1994, abdominal aortic aneurysm   Lung cancer Mother 98   Heart disease Father    Testicular cancer Father 56   Prostate cancer Father 26   Leukemia Father        CLL   Breast cancer Paternal Aunt    Diabetes Maternal Grandmother    Heart disease Maternal Grandfather    Heart disease Paternal Grandmother    Diabetes Paternal Grandfather    Stomach cancer Paternal Grandfather    Breast cancer Cousin    Colon cancer Neg Hx    Throat cancer Neg Hx    Pancreatic cancer Neg Hx    Rectal cancer Neg  Hx     Social History   Socioeconomic History   Marital status: Married    Spouse name: Not on file   Number of children: 2   Years of education: Not on file   Highest education level: Some college, no degree  Occupational History   Not on file  Tobacco Use   Smoking status: Former    Current packs/day: 0.00    Average packs/day: 2.0 packs/day for 15.0 years (30.0 ttl pk-yrs)    Types: Cigarettes    Start date: 11/20/1974    Quit date: 11/19/1989    Years since quitting: 34.0   Smokeless tobacco: Never  Vaping Use   Vaping status: Never Used  Substance  and Sexual Activity   Alcohol use: Yes    Comment: maybe a glass of wine on the weekend   Drug use: No   Sexual activity: Not on file  Other Topics Concern   Not on file  Social History Narrative   [employer] 2 years   Marries '822 daughters '24 '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    Social Drivers of Corporate investment banker Strain: Low Risk  (08/19/2023)   Overall Financial Resource Strain (CARDIA)    Difficulty of Paying Living Expenses: Not hard at all  Food Insecurity: No Food Insecurity (08/19/2023)   Hunger Vital Sign    Worried About Running Out of Food in the Last Year: Never true    Ran Out of Food in the Last Year: Never true  Transportation Needs: No Transportation Needs (08/19/2023)   PRAPARE - Administrator, Civil Service (Medical): No    Lack of Transportation (Non-Medical): No  Physical Activity: Inactive (08/19/2023)   Exercise Vital Sign    Days of Exercise per Week: 0 days    Minutes of Exercise per Session: 0 min  Stress: Stress Concern Present (08/19/2023)   [employer] of Occupational Health - Occupational Stress Questionnaire    Feeling of Stress : To some extent  Social Connections: Socially Integrated (08/19/2023)   Social Connection and Isolation Panel    Frequency of Communication with Friends and Family: Three times a week    Frequency of Social Gatherings  with Friends and Family: Three times a week    Attends Religious Services: More than 4 times per year    Active Member of Clubs or Organizations: Yes    Attends Banker Meetings: 1 to 4 times per year    Marital Status: Married  Catering manager Violence: Not At Risk (08/19/2023)   Humiliation, Afraid, Rape, and Kick questionnaire    Fear of Current or Ex-Partner: No    Emotionally Abused: No    Physically Abused: No    Sexually Abused: No    Physical Exam: Vital signs in last 24 hours: @BP  (!) 161/76   Pulse 67   Temp 98.3 F (36.8 C) (Temporal)   Resp 16   Ht 5' 8 (1.727 m)   Wt 83.9 kg   LMP  (LMP Unknown)   SpO2 97%   BMI 28.12 kg/m  GEN: NAD EYE: Sclerae anicteric ENT: MMM CV: Non-tachycardic Pulm: CTA b/l GI: Soft, NT/ND NEURO:  Alert & Oriented x 3   Sandor Flatter, DO [facility]   12/14/2023 7:33 AM

## 2023-12-14 NOTE — Anesthesia Postprocedure Evaluation (Signed)
 Anesthesia Post Note  Patient: Chelsey Jackson  Procedure(s) Performed: COLONOSCOPY     Patient location during evaluation: PACU Anesthesia Type: MAC Level of consciousness: awake and alert Pain management: pain level controlled Vital Signs Assessment: post-procedure vital signs reviewed and stable Respiratory status: spontaneous breathing, nonlabored ventilation, respiratory function stable and patient connected to nasal cannula oxygen Cardiovascular status: stable and blood pressure returned to baseline Postop Assessment: no apparent nausea or vomiting Anesthetic complications: no   No notable events documented.  Last Vitals:  Vitals:   12/14/23 0820 12/14/23 0830  BP: 115/64 (!) 149/78  Pulse: 63 60  Resp: (!) 21 18  Temp:    SpO2: 98% 97%    Last Pain:  Vitals:   12/14/23 0830  TempSrc:   PainSc: 0-No pain                 Epifanio Lamar BRAVO

## 2023-12-14 NOTE — Interval H&P Note (Signed)
 History and Physical Interval Note:  12/14/2023 7:36 AM  Chelsey Jackson  has presented today for surgery, with the diagnosis of Screening for colon cancer.  The various methods of treatment have been discussed with the patient and family. After consideration of risks, benefits and other options for treatment, the patient has consented to  Procedure(s): COLONOSCOPY (N/A) as a surgical intervention.  The patient's history has been reviewed, patient examined, no change in status, stable for surgery.  I have reviewed the patient's chart and labs.  Questions were answered to the patient's satisfaction.     Sandor GAILS [name]

## 2023-12-14 NOTE — Discharge Instructions (Signed)

## 2023-12-14 NOTE — Op Note (Signed)
 [facility] ([facility]) Patient Name: Chelsey Jackson Procedure Date: 12/14/2023 MRN: [id number] Attending MD: Sandor Flatter , MD, [id number] Date of Birth: April 25, 1950 CSN: [id number] Age: 73 Admit Type: Outpatient Procedure:                Colonoscopy Indications:              Surveillance: Personal history of adenomatous                            polyps on last colonoscopy 5 years ago                           Last colonoscopy was 06/2018 and notable for 6                            subcentimeter adenomas and sessile serrated polyps,                            2 small rectal hyperplastic polyps, single sigmoid                            diverticulum. Providers:                Sandor Flatter, MD, Almarie Masters, RN, Felice Sar, Technician Referring MD:              Medicines:                Monitored Anesthesia Care Complications:            No immediate complications. Estimated Blood Loss:     Estimated blood loss was minimal. Procedure:                Pre-Anesthesia Assessment:                           - Prior to the procedure, a History and Physical                            was performed, and patient medications and                            allergies were reviewed. The patient's tolerance of                            previous anesthesia was also reviewed. The risks                            and benefits of the procedure and the sedation                            options and risks were discussed with the patient.                            All questions were answered, and informed  consent                            was obtained. Prior Anticoagulants: The patient has                            taken no anticoagulant or antiplatelet agents. ASA                            Grade Assessment: II - A patient with mild systemic                            disease. After reviewing the risks and benefits,                            the patient was  deemed in satisfactory condition to                            undergo the procedure.                           After obtaining informed consent, the colonoscope                            was passed under direct vision. Throughout the                            procedure, the patient's blood pressure, pulse, and                            oxygen saturations were monitored continuously. The                            CF-HQ190L ([id number]) Olympus colonoscope was                            introduced through the anus and advanced to the the                            terminal ileum. The colonoscopy was performed                            without difficulty. The patient tolerated the                            procedure well. The quality of the bowel                            preparation was good. The terminal ileum, ileocecal                            valve, appendiceal orifice, and rectum were  photographed. Scope In: 7:45:44 AM Scope Out: 8:04:43 AM Scope Withdrawal Time: 0 hours 14 minutes 39 seconds  Total Procedure Duration: 0 hours 18 minutes 59 seconds  Findings:      Skin tags were found on perianal exam.      Two sessile polyps were found in the distal transverse colon. The polyps       were 3 to 4 mm in size. These polyps were removed with a cold snare.       Resection and retrieval were complete. Estimated blood loss was minimal.      A 2 mm polyp was found in the rectum. The polyp was sessile. The polyp       was removed with a cold snare. Resection and retrieval were complete.       Estimated blood loss was minimal.      A few small-mouthed diverticula were found in the sigmoid colon and       ascending colon.      The retroflexed view of the distal rectum and anal verge was normal and       showed no anal or rectal abnormalities.      The terminal ileum appeared normal. Impression:               - Perianal skin tags found on perianal exam.                            - Two 3 to 4 mm polyps in the distal transverse                            colon, removed with a cold snare. Resected and                            retrieved.                           - One 2 mm polyp in the rectum, removed with a cold                            snare. Resected and retrieved.                           - Diverticulosis in the sigmoid colon and in the                            ascending colon.                           - The distal rectum and anal verge are normal on                            retroflexion view.                           - The examined portion of the ileum was normal. Moderate Sedation:      Not Applicable - Patient had care per Anesthesia. Recommendation:           - Patient has a contact number available for  emergencies. The signs and symptoms of potential                            delayed complications were discussed with the                            patient. Return to normal activities tomorrow.                            Written discharge instructions were provided to the                            patient.                           - Resume previous diet.                           - Continue present medications.                           - Await pathology results.                           - Repeat colonoscopy for surveillance based on                            pathology results.                           - Return to GI office PRN. Procedure Code(s):        --- Professional ---                           [phone], Colonoscopy, flexible; with removal of                            tumor(s), polyp(s), or other lesion(s) by snare                            technique Diagnosis Code(s):        --- Professional ---                           Z86.010, Personal history of colonic polyps                           D12.3, Benign neoplasm of transverse colon (hepatic                            flexure or splenic  flexure)                           D12.8, Benign neoplasm of rectum                           K64.4, Residual hemorrhoidal skin tags  K57.30, Diverticulosis of large intestine without                            perforation or abscess without bleeding CPT copyright 2022 American Medical Association. All rights reserved. The codes documented in this report are preliminary and upon coder review may  be revised to meet current compliance requirements. Sandor Flatter, MD 12/14/2023 8:15:35 AM Number of Addenda: 0

## 2023-12-15 ENCOUNTER — Encounter (HOSPITAL_COMMUNITY): Payer: Self-pay | Admitting: Gastroenterology

## 2023-12-15 ENCOUNTER — Ambulatory Visit: Payer: Self-pay | Admitting: Gastroenterology

## 2023-12-15 LAB — SURGICAL PATHOLOGY

## 2023-12-22 ENCOUNTER — Encounter: Payer: Self-pay | Admitting: Physician Assistant

## 2023-12-28 ENCOUNTER — Other Ambulatory Visit (HOSPITAL_BASED_OUTPATIENT_CLINIC_OR_DEPARTMENT_OTHER): Payer: Self-pay

## 2023-12-28 MED ORDER — COVID-19 MRNA VACC (MODERNA) 50 MCG/0.5ML IM SUSP
0.5000 mL | Freq: Once | INTRAMUSCULAR | 0 refills | Status: AC
  Filled 2023-12-28: qty 0.5, 1d supply, fill #0

## 2023-12-29 ENCOUNTER — Other Ambulatory Visit (HOSPITAL_BASED_OUTPATIENT_CLINIC_OR_DEPARTMENT_OTHER): Payer: Self-pay | Admitting: Cardiology

## 2024-01-07 ENCOUNTER — Other Ambulatory Visit: Payer: Self-pay | Admitting: Physician Assistant

## 2024-01-08 ENCOUNTER — Other Ambulatory Visit (HOSPITAL_BASED_OUTPATIENT_CLINIC_OR_DEPARTMENT_OTHER): Payer: Self-pay

## 2024-01-08 MED ORDER — FLUZONE HIGH-DOSE 0.5 ML IM SUSY
0.5000 mL | PREFILLED_SYRINGE | Freq: Once | INTRAMUSCULAR | 0 refills | Status: AC
  Filled 2024-01-08: qty 0.5, 1d supply, fill #0

## 2024-01-08 MED ORDER — COMIRNATY 30 MCG/0.3ML IM SUSY
0.3000 mL | PREFILLED_SYRINGE | Freq: Once | INTRAMUSCULAR | 0 refills | Status: AC
Start: 2024-01-08 — End: 2024-01-09
  Filled 2024-01-08: qty 0.3, 1d supply, fill #0

## 2024-02-15 ENCOUNTER — Encounter: Payer: Self-pay | Admitting: Family Medicine

## 2024-02-15 ENCOUNTER — Ambulatory Visit (INDEPENDENT_AMBULATORY_CARE_PROVIDER_SITE_OTHER): Admitting: Family Medicine

## 2024-02-15 VITALS — BP 138/80 | HR 61 | Temp 98.1°F | Ht 68.0 in | Wt 185.0 lb

## 2024-02-15 DIAGNOSIS — J309 Allergic rhinitis, unspecified: Secondary | ICD-10-CM | POA: Diagnosis not present

## 2024-02-15 DIAGNOSIS — J069 Acute upper respiratory infection, unspecified: Secondary | ICD-10-CM | POA: Diagnosis not present

## 2024-02-15 DIAGNOSIS — J4520 Mild intermittent asthma, uncomplicated: Secondary | ICD-10-CM | POA: Diagnosis not present

## 2024-02-15 MED ORDER — AZITHROMYCIN 250 MG PO TABS: ORAL_TABLET | ORAL | 0 refills | Status: DC

## 2024-02-15 NOTE — Patient Instructions (Signed)
 Please follow up if symptoms do not improve or as needed.     VISIT SUMMARY: During today's visit, we discussed your persistent post-nasal drip and cough following a recent cold. We also reviewed your asthma status and current medications. You reported no significant asthma symptoms or exacerbations, and we discussed your sleep disturbances and mild fatigue.  YOUR PLAN: -ACUTE UPPER RESPIRATORY INFECTION: You have symptoms of a prolonged viral upper respiratory infection, which may be developing into a sinus infection. This is causing your post-nasal drip, cough, and mild fatigue. We will start you on antibiotics to address the potential sinus infection. Continue using your nasal sprays (Flonase and the other prescribed spray) and add a decongestant like Mucinex D or plain Sudafed to help reduce the post-nasal drip. You can also try zinc or lemon remedies for additional relief. Please reassess your symptoms by the end of the week.  -ASTHMA: Your asthma is currently stable with no signs of exacerbation such as wheezing, chest tightness, or shortness of breath. Continue with your current asthma management plan and medications.  INSTRUCTIONS: Please start the prescribed antibiotics and continue using your nasal sprays and decongestants as discussed. Reassess your symptoms by the end of the week and contact us  if there is no improvement or if your symptoms worsen.                      Contains text generated by Abridge.                                 Contains text generated by Abridge.

## 2024-02-15 NOTE — Progress Notes (Signed)
 Subjective  CC:  Chief Complaint  Patient presents with   Cough    Cough along with some drainage for the week.   Same day acute visit; PCP not available. New pt to me. Chart reviewed.   HPI: Chelsey Jackson is a 73 y.o. female who presents to the office today to address the problems listed above in the chief complaint. Discussed the use of AI scribe software for clinical note transcription with the patient, who gave verbal consent to proceed.  History of Present Illness Chelsey Jackson is a 73 year old female with asthma who presents with persistent post-nasal drip and cough following a recent cold.  Upper respiratory symptoms - Persistent post-nasal drip with drainage down the back of the throat, present for approximately one week following a recent cold while traveling in [place]  and [place]  - Drainage is clear or green in color - Cough is present and attributed to post-nasal drainage - No significant facial pain, fever, or body aches - No epistaxis; episode feels different from prior sinus infections, which are usually drier and associated with nosebleeds - No chest pain  Asthma and respiratory status - No current asthma exacerbation - No wheezing, shortness of breath, or chest tightness - No need for additional asthma medications at this time  Sleep disturbance - Cough does not usually keep her awake at night - Occasionally wakes up to use the bathroom and then starts coughing, making it difficult to fall back asleep  Fatigue - Mild fatigue present  Symptomatic management and medication use/allergies - Uses Mucinex, which helps thin mucus but does not dry it up - Uses Flonase and another unspecified nasal spray - Takes Xyzal nightly and does not typically require additional decongestants - Able to take decongestants if needed; no history of hypertension  Immunization status - Received vaccinations for RSV, COVID, and influenza   Assessment  1.  Upper respiratory tract infection, unspecified type   2. Chronic allergic rhinitis   3. Mild intermittent asthma without complication      Plan  Assessment and Plan Assessment & Plan Acute upper respiratory infection Symptoms consistent with a prolonged viral upper respiratory infection, possibly complicated by early sinus infection. Symptoms include post-nasal drip with clear or green discharge, cough, and mild fatigue. No fever, facial pain, or chest pain. Asthma not exacerbated. Differential includes viral infection and early sinus infection. Decision to treat with antibiotics due to travel history, persistent symptoms, and sinus infections. - Prescribe antibiotics to cover potential sinus infection.(Zpak) - Continue using nasal sprays (Flonase and another prescribed spray). - Add a decongestant such as Mucinex D or plain Sudafed to reduce post-nasal drip. - Consider using zinc or vit C remedies for symptomatic relief. - Reassess symptoms by the end of the week.  Asthma Asthma is not currently exacerbated. No wheezing, chest tightness, or shortness of breath reported.    Follow up: prn No orders of the defined types were placed in this encounter.  Meds ordered this encounter  Medications   azithromycin  (ZITHROMAX ) 250 MG tablet    Sig: Take 2 tabs today, then 1 tab daily for 4 days    Dispense:  1 each    Refill:  0     I reviewed the patients updated PMH, FH, and SocHx.  Patient Active Problem List   Diagnosis Date Noted   Adenomatous polyp of transverse colon 12/14/2023   History of colonic polyps 12/14/2023   Rectal polyp 12/14/2023   Complete tear  of left rotator cuff 03/29/2022   Biceps tendinitis on left 03/29/2022   Degenerative superior labral 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 (SLAP) tear of left shoulder 03/29/2022   Synovitis of left shoulder 03/29/2022   Family history of heart disease 01/07/2022   Allergic rhinitis 09/12/2020   Allergic rhinitis due to animal (cat)  (dog) hair and dander 09/12/2020   Mild intermittent asthma 09/12/2020   Vasomotor rhinitis 09/12/2020   Hyperlipidemia, mild 05/17/2020   Impingement syndrome of left shoulder 04/03/2020   Urge incontinence 11/05/2018   Diverticulosis of colon without hemorrhage    Primary osteoarthritis of first carpometacarpal joint of left hand 06/22/2018   Chronic pain of left thumb 06/22/2018   History of retinal tear 10/20/2016   Cough 05/02/2016   Low back pain 09/28/2015   Insomnia 08/18/2013   OSA (obstructive sleep apnea) 07/31/2013   Thyroid  nodule, cold 07/15/2012   Anxiety and depression 10/05/2007   Allergic rhinitis due to pollen 10/05/2007   Asthma 10/05/2007   Current Meds  Medication Sig   azithromycin  (ZITHROMAX ) 250 MG tablet Take 2 tabs today, then 1 tab daily for 4 days   BREYNA 80-4.5 MCG/ACT inhaler Inhale 2 puffs into the lungs as needed.   buPROPion  (WELLBUTRIN  XL) 300 MG 24 hr tablet TAKE 1 TABLET BY MOUTH DAILY   EPINEPHrine  0.3 mg/0.3 mL IJ SOAJ injection Inject 0.3 mg into the muscle as needed for anaphylaxis. On hand   fluticasone (FLONASE) 50 MCG/ACT nasal spray Place 2 sprays into both nostrils at bedtime.   hydroxypropyl methylcellulose / hypromellose (ISOPTO TEARS / GONIOVISC) 2.5 % ophthalmic solution Place 1 drop into both eyes as needed for dry eyes.   ipratropium (ATROVENT) 0.03 % nasal spray Place 2 sprays into both nostrils daily.   Iron-Vitamin C 65-125 MG TABS Take 1 tablet by mouth every evening.   levocetirizine (XYZAL) 5 MG tablet Take 5 mg by mouth every evening.   montelukast  (SINGULAIR ) 10 MG tablet Take 1 tablet (10 mg total) by mouth at bedtime.   Multiple Vitamin (MULTIVITAMIN) tablet Take 1 tablet by mouth daily.   Oyster Shell Calcium  500 MG TABS Take 1 tablet by mouth daily.   rosuvastatin  (CRESTOR ) 5 MG tablet TAKE 1 TABLET BY MOUTH DAILY   SPRAVATO, 84 MG DOSE, 28 MG/DEVICE SOPK Place 1 spray into the nose 2 (two) times a week.   temazepam   (RESTORIL ) 30 MG capsule TAKE 1 CAPSULE BY MOUTH AT BEDTIME   Allergies: Patient is allergic to penicillins. Family History: Patient family history includes Breast cancer in her cousin and paternal aunt; Diabetes in her maternal grandmother and paternal grandfather; Fibroids in her mother; Heart disease in her father, maternal grandfather, and paternal grandmother; Leukemia in her father; Lung cancer (age of onset: 25) in her mother; Osteoporosis in her mother; Other in her mother; Prostate cancer (age of onset: 50) in her father; Stomach cancer in her paternal grandfather; Testicular cancer (age of onset: 27) in her father. Social History:  Patient  reports that she quit smoking about 34 years ago. Her smoking use included cigarettes. She started smoking about 49 years ago. She has a 30 pack-year smoking history. She has never used smokeless tobacco. She reports current alcohol use. She reports that she does not use drugs.  Review of Systems: Constitutional: Negative for fever malaise or anorexia Cardiovascular: negative for chest pain Respiratory: negative for SOB or persistent cough Gastrointestinal: negative for abdominal pain  Objective  Vitals: BP 138/80   Pulse 61   Temp  98.1 F (36.7 C)   Ht 5' 8 (1.727 m)   Wt 185 lb (83.9 kg) Comment: pt reported  LMP  (LMP Unknown)   SpO2 97%   BMI 28.13 kg/m  General: no acute distress , A&Ox3, nontoxic HEENT: PEERL, conjunctiva normal, neck is supple, Tms clear, mild sinus ttp bilaterally, no cervical LAD Cardiovascular:  RRR without murmur or gallop.  Respiratory:  Good breath sounds bilaterally, CTAB with normal respiratory effort, no wheezing Skin:  Warm, no rashes Commons side effects, risks, benefits, and alternatives for medications and treatment plan prescribed today were discussed, and the patient expressed understanding of the given instructions. Patient is instructed to call or message via MyChart if he/she has any questions or  concerns regarding our treatment plan. No barriers to understanding were identified. We discussed Red Flag symptoms and signs in detail. Patient expressed understanding regarding what to do in case of urgent or emergency type symptoms.  Medication list was reconciled, printed and provided to the patient in AVS. Patient instructions and summary information was reviewed with the patient as documented in the AVS. This note was prepared with assistance of Dragon voice recognition software. Occasional wrong-word or sound-a-like substitutions may have occurred due to the inherent limitations of voice recognition software

## 2024-02-18 ENCOUNTER — Telehealth: Payer: Self-pay | Admitting: *Deleted

## 2024-02-18 ENCOUNTER — Encounter: Payer: Self-pay | Admitting: Physician Assistant

## 2024-02-18 ENCOUNTER — Other Ambulatory Visit: Payer: Self-pay | Admitting: Physician Assistant

## 2024-02-18 MED ORDER — TEMAZEPAM 30 MG PO CAPS: 30.0000 mg | ORAL_CAPSULE | Freq: Every day | ORAL | 0 refills | Status: DC

## 2024-02-18 NOTE — Telephone Encounter (Signed)
 Received fax from [facility] requesting refill for Temazepam  30 mg capsule. Last OV 02/15/2024 with Dr. Jodie, saw you last 06/15/2023.

## 2024-02-19 NOTE — Telephone Encounter (Signed)
 Spoke to pt told her [facility] sent in a 1 month supply of Temazepam  and needs to schedule an appt. Pt verbalized understanding and saw that and she scheduled. Told her okay.

## 2024-02-25 ENCOUNTER — Other Ambulatory Visit

## 2024-02-25 ENCOUNTER — Encounter: Payer: Self-pay | Admitting: Physician Assistant

## 2024-02-25 ENCOUNTER — Ambulatory Visit (INDEPENDENT_AMBULATORY_CARE_PROVIDER_SITE_OTHER): Admitting: Physician Assistant

## 2024-02-25 VITALS — BP 122/80 | HR 73 | Temp 98.1°F | Ht 68.0 in | Wt 176.2 lb

## 2024-02-25 DIAGNOSIS — F32A Depression, unspecified: Secondary | ICD-10-CM

## 2024-02-25 DIAGNOSIS — F5101 Primary insomnia: Secondary | ICD-10-CM

## 2024-02-25 DIAGNOSIS — E785 Hyperlipidemia, unspecified: Secondary | ICD-10-CM | POA: Diagnosis not present

## 2024-02-25 DIAGNOSIS — J309 Allergic rhinitis, unspecified: Secondary | ICD-10-CM

## 2024-02-25 DIAGNOSIS — G4733 Obstructive sleep apnea (adult) (pediatric): Secondary | ICD-10-CM

## 2024-02-25 DIAGNOSIS — F419 Anxiety disorder, unspecified: Secondary | ICD-10-CM

## 2024-02-25 MED ORDER — TEMAZEPAM 30 MG PO CAPS: 30.0000 mg | ORAL_CAPSULE | Freq: Every day | ORAL | 1 refills | Status: AC

## 2024-02-25 NOTE — Progress Notes (Signed)
 Chelsey Jackson is a 73 y.o. female here for a follow up of a pre-existing problem.  History of Present Illness:   Chief Complaint  Patient presents with   Medical Management of Chronic Issues    Pt here for f/u Insomnia, currently taking Temazepam  30 mg.    Discussed the use of AI scribe software for clinical note transcription with the patient, who gave verbal consent to proceed.  History of Present Illness   Chelsey Jackson is a 73 year old female who presents for medication management and follow-up on her shoulder surgery and depression treatment.  She underwent shoulder surgery in May 2025 and has improved mobility, though discomfort persists. She is working on range of motion with home exercises and monthly physical therapy. Her goal is to fasten her bra from the back, which she has not yet achieved.  She has treatment-resistant depression and has been on Spravato since June 2025, now reduced to every other week. She experiences improved mood and activity levels.  She takes temazepam  for sleep but has frequent nocturnal awakenings, often due to bladder issues, and rarely achieves a continuous six to seven-hour sleep block. She previously discontinued Ambien due to ineffectiveness.  She is on Wellbutrin  and Singulair , though she finds it difficult to assess Singulair 's effectiveness. She has an upcoming appointment with a pulmonologist to reestablish care. She transitioned from a CPAP machine to an oral device for sleep apnea, which she finds more comfortable and effective, with a twice-yearly maintenance schedule.       Past Medical History:  Diagnosis Date   AC (acromioclavicular) joint bone spurs    left shoulder with chronic pain, w/limitation ROM   Allergy    weekly allergy shots   Anemia    hx of   Anxiety    Arthritis    Asthma    Cataract Already had both eyes done   Depression    Fibrocystic breast disease    GERD (gastroesophageal reflux disease)     OSA (obstructive sleep apnea) 07/31/2013   Peptic ulcer disease    Sleep apnea Use CPAP   Currently use new approved mouthguard   TMJ locking      Social History   Tobacco Use   Smoking status: Former    Current packs/day: 0.00    Average packs/day: 2.0 packs/day for 15.0 years (30.0 ttl pk-yrs)    Types: Cigarettes    Start date: 11/20/1974    Quit date: 11/19/1989    Years since quitting: 34.2   Smokeless tobacco: Never  Vaping Use   Vaping status: Never Used  Substance Use Topics   Alcohol use: Yes    Comment: maybe a glass of wine on the weekend   Drug use: No    Past Surgical History:  Procedure Laterality Date   ABDOMINAL HYSTERECTOMY  2003   due to fibroids   ADENOIDECTOMY     x3   BICEPT TENODESIS Left 03/25/2022   Procedure: BICEPS TENODESIS;  Surgeon: Addie Cordella Hamilton, MD;  Location: [facility];  Service: Orthopedics;  Laterality: Left;   BIOPSY  06/29/2018   Procedure: BIOPSY;  Surgeon: San Sandor GAILS, DO;  Location: [facility];  Service: Gastroenterology;;   BREAST CYST ASPIRATION     COLONOSCOPY  03/10/2007   results-normal   COLONOSCOPY N/A 12/14/2023   Procedure: COLONOSCOPY;  Surgeon: San Sandor GAILS, DO;  Location: [facility];  Service: Gastroenterology;  Laterality: N/A;   COLONOSCOPY WITH PROPOFOL  N/A 06/29/2018  Procedure: COLONOSCOPY WITH PROPOFOL ;  Surgeon: San Sandor GAILS, DO;  Location: [facility];  Service: Gastroenterology;  Laterality: N/A;   DILATION AND CURETTAGE OF UTERUS     EYE SURGERY  Cataracts and retinal tear   HAMMER TOE SURGERY     POLYPECTOMY  06/29/2018   Procedure: POLYPECTOMY;  Surgeon: San Sandor GAILS, DO;  Location: [facility];  Service: Gastroenterology;;   PUBOVAGINAL SLING  '90's   for incontinence   RHINOPLASTY  1984   ROTATOR CUFF REPAIR Right 2013   [facility]   ROTATOR CUFF REPAIR Left 2012   [facility]   SHOULDER ARTHROSCOPY WITH ROTATOR CUFF REPAIR AND SUBACROMIAL DECOMPRESSION Left 03/25/2022    Procedure: LEFT SHOULDER ARTHROSCOPY, SUBACROMIAL DECOMPRESSION, DEBRIDEMENT, MINI OPEN ROTATOR CUFF TEAR REPAIR;  Surgeon: Addie Cordella Hamilton, MD;  Location: [facility];  Service: Orthopedics;  Laterality: Left;   TONSILLECTOMY     x2   TUBAL LIGATION     TURBINATE REDUCTION  1997   WRIST SURGERY      Family History  Problem Relation Age of Onset   Osteoporosis Mother    Fibroids Mother    Other Mother        aortic valve replacement 1994, abdominal aortic aneurysm   Lung cancer Mother 59   Cancer Mother    Heart disease Father    Testicular cancer Father 31   Prostate cancer Father 72   Leukemia Father        CLL   Depression Father    Hearing loss Father    Kidney disease Father    Breast cancer Paternal Aunt    Diabetes Maternal Grandmother    Heart disease Maternal Grandfather    Heart disease Paternal Grandmother    Diabetes Paternal Grandfather    Stomach cancer Paternal Grandfather    Breast cancer Cousin    Colon cancer Neg Hx    Throat cancer Neg Hx    Pancreatic cancer Neg Hx    Rectal cancer Neg Hx     Allergies  Allergen Reactions   Penicillins Anaphylaxis    REACTION: stop breathing    Current Medications:   Current Outpatient Medications:    BREYNA 80-4.5 MCG/ACT inhaler, Inhale 2 puffs into the lungs as needed., Disp: , Rfl:    buPROPion  (WELLBUTRIN  XL) 300 MG 24 hr tablet, TAKE 1 TABLET BY MOUTH DAILY, Disp: 90 tablet, Rfl: 0   EPINEPHrine  0.3 mg/0.3 mL IJ SOAJ injection, Inject 0.3 mg into the muscle as needed for anaphylaxis. On hand, Disp: , Rfl:    fluticasone (FLONASE) 50 MCG/ACT nasal spray, Place 2 sprays into both nostrils at bedtime., Disp: , Rfl:    hydroxypropyl methylcellulose / hypromellose (ISOPTO TEARS / GONIOVISC) 2.5 % ophthalmic solution, Place 1 drop into both eyes as needed for dry eyes., Disp: , Rfl:    ipratropium (ATROVENT) 0.03 % nasal spray, Place 2 sprays into both nostrils daily., Disp: , Rfl:    Iron-Vitamin C 65-125 MG  TABS, Take 1 tablet by mouth every evening., Disp: , Rfl:    levocetirizine (XYZAL) 5 MG tablet, Take 5 mg by mouth every evening., Disp: , Rfl:    montelukast  (SINGULAIR ) 10 MG tablet, TAKE 1 TABLET BY MOUTH AT BEDTIME, Disp: 90 tablet, Rfl: 1   Multiple Vitamin (MULTIVITAMIN) tablet, Take 1 tablet by mouth daily., Disp: , Rfl:    Oyster Shell Calcium  500 MG TABS, Take 1 tablet by mouth daily., Disp: , Rfl:    rosuvastatin  (CRESTOR ) 5 MG  tablet, TAKE 1 TABLET BY MOUTH DAILY, Disp: 90 tablet, Rfl: 1   SPRAVATO, 84 MG DOSE, 28 MG/DEVICE SOPK, Place 1 spray into the nose 2 (two) times a week. (Patient taking differently: Place 2 sprays into the nose every 14 (fourteen) days.), Disp: , Rfl:    temazepam  (RESTORIL ) 30 MG capsule, Take 1 capsule (30 mg total) by mouth at bedtime., Disp: 30 capsule, Rfl: 0   Review of Systems:   Negative unless otherwise specified per HPI.  Vitals:   Vitals:   02/25/24 1104  BP: 122/80  Pulse: 73  Temp: 98.1 F (36.7 C)  TempSrc: Temporal  SpO2: 97%  Weight: 176 lb 4 oz (79.9 kg)  Height: 5' 8 (1.727 m)     Body mass index is 26.8 kg/m.  Physical Exam:   Physical Exam Vitals and nursing note reviewed.  Constitutional:      General: She is not in acute distress.    Appearance: She is well-developed. She is not ill-appearing or toxic-appearing.  Cardiovascular:     Rate and Rhythm: Normal rate and regular rhythm.     Pulses: Normal pulses.     Heart sounds: Normal heart sounds, S1 normal and S2 normal.  Pulmonary:     Effort: Pulmonary effort is normal.     Breath sounds: Normal breath sounds.  Skin:    General: Skin is warm and dry.  Neurological:     Mental Status: She is alert.     GCS: GCS eye subscore is 4. GCS verbal subscore is 5. GCS motor subscore is 6.  Psychiatric:        Speech: Speech normal.        Behavior: Behavior normal. Behavior is cooperative.     Assessment and Plan:   Assessment and Plan    Anxiety and  Depression Managed with Spravato since June, showing improvement in symptoms and daily activities. - Continue Spravato every other week per specialists - Continue Wellbutrin  300 mg xl daily  Primary insomnia Managed with temazepam  (Restoril ). Reports difficulty maintaining sleep, waking up once or more per night, and achieving only 3-4 hours of sleep per night. Temazepam  provides a 6-7 hour sleep block twice a month, which is considered a miracle. - Continue temazepam  (Restoril ) as prescribed. - PDMP reviewed during this encounter.   Hyperlipidemia Requires blood work for cholesterol monitoring. Has not fasted for the current visit. - Ordered blood work for cholesterol monitoring. - Instructed her to schedule a fasting blood work appointment.  Obstructive sleep apnea Managed with an oral device, which she reports is more effective than CPAP. Has not seen a pulmonologist in years and plans to reestablish care. - Continue using the oral device for sleep apnea management. - Attend pulmonology appointment in December to reestablish care.    Lucie Buttner, PA-C

## 2024-02-25 NOTE — Patient Instructions (Signed)
 It was great to see you!  We will refill medications and update blood work  I hope your year continues to improve!  Let's follow-up in 6 months, sooner if you have concerns.  Take care,  Lucie Buttner PA-C

## 2024-03-01 ENCOUNTER — Other Ambulatory Visit

## 2024-03-01 DIAGNOSIS — F419 Anxiety disorder, unspecified: Secondary | ICD-10-CM | POA: Diagnosis not present

## 2024-03-01 DIAGNOSIS — E785 Hyperlipidemia, unspecified: Secondary | ICD-10-CM

## 2024-03-01 DIAGNOSIS — F32A Depression, unspecified: Secondary | ICD-10-CM | POA: Diagnosis not present

## 2024-03-01 LAB — CBC WITH DIFFERENTIAL/PLATELET
Basophils Absolute: 0 K/uL (ref 0.0–0.1)
Basophils Relative: 0.8 % (ref 0.0–3.0)
Eosinophils Absolute: 0.2 K/uL (ref 0.0–0.7)
Eosinophils Relative: 3.3 % (ref 0.0–5.0)
HCT: 37.5 % (ref 36.0–46.0)
Hemoglobin: 12.9 g/dL (ref 12.0–15.0)
Lymphocytes Relative: 26.9 % (ref 12.0–46.0)
Lymphs Abs: 1.3 K/uL (ref 0.7–4.0)
MCHC: 34.2 g/dL (ref 30.0–36.0)
MCV: 83.5 fl (ref 78.0–100.0)
Monocytes Absolute: 0.3 K/uL (ref 0.1–1.0)
Monocytes Relative: 6.7 % (ref 3.0–12.0)
Neutro Abs: 2.9 K/uL (ref 1.4–7.7)
Neutrophils Relative %: 62.3 % (ref 43.0–77.0)
Platelets: 235 K/uL (ref 150.0–400.0)
RBC: 4.5 Mil/uL (ref 3.87–5.11)
RDW: 14.5 % (ref 11.5–15.5)
WBC: 4.7 K/uL (ref 4.0–10.5)

## 2024-03-01 LAB — COMPREHENSIVE METABOLIC PANEL WITH GFR
ALT: 47 U/L — ABNORMAL HIGH (ref 0–35)
AST: 27 U/L (ref 0–37)
Albumin: 4.6 g/dL (ref 3.5–5.2)
Alkaline Phosphatase: 66 U/L (ref 39–117)
BUN: 18 mg/dL (ref 6–23)
CO2: 29 meq/L (ref 19–32)
Calcium: 9.4 mg/dL (ref 8.4–10.5)
Chloride: 104 meq/L (ref 96–112)
Creatinine, Ser: 0.85 mg/dL (ref 0.40–1.20)
GFR: 69.03 mL/min (ref >60.00)
Glucose, Bld: 99 mg/dL (ref 70–99)
Potassium: 4.4 meq/L (ref 3.5–5.1)
Sodium: 140 meq/L (ref 135–145)
Total Bilirubin: 0.7 mg/dL (ref 0.2–1.2)
Total Protein: 7.2 g/dL (ref 6.0–8.3)

## 2024-03-01 LAB — LIPID PANEL
Cholesterol: 150 mg/dL (ref 0–200)
HDL: 39.1 mg/dL (ref >39.00)
LDL Cholesterol: 89 mg/dL (ref 0–99)
NonHDL: 110.42
Total CHOL/HDL Ratio: 4
Triglycerides: 106 mg/dL (ref 0.0–149.0)
VLDL: 21.2 mg/dL (ref 0.0–40.0)

## 2024-03-02 ENCOUNTER — Ambulatory Visit: Payer: Self-pay | Admitting: Physician Assistant

## 2024-03-02 DIAGNOSIS — R7989 Other specified abnormal findings of blood chemistry: Secondary | ICD-10-CM

## 2024-03-14 ENCOUNTER — Ambulatory Visit: Payer: Self-pay | Admitting: Physician Assistant

## 2024-03-14 ENCOUNTER — Other Ambulatory Visit

## 2024-03-14 DIAGNOSIS — R7989 Other specified abnormal findings of blood chemistry: Secondary | ICD-10-CM

## 2024-03-14 LAB — COMPREHENSIVE METABOLIC PANEL WITH GFR
ALT: 28 U/L (ref 0–35)
AST: 23 U/L (ref 0–37)
Albumin: 4.4 g/dL (ref 3.5–5.2)
Alkaline Phosphatase: 55 U/L (ref 39–117)
BUN: 24 mg/dL — ABNORMAL HIGH (ref 6–23)
CO2: 31 meq/L (ref 19–32)
Calcium: 9.4 mg/dL (ref 8.4–10.5)
Chloride: 106 meq/L (ref 96–112)
Creatinine, Ser: 0.85 mg/dL (ref 0.40–1.20)
GFR: 69.01 mL/min (ref >60.00)
Glucose, Bld: 99 mg/dL (ref 70–99)
Potassium: 4.9 meq/L (ref 3.5–5.1)
Sodium: 140 meq/L (ref 135–145)
Total Bilirubin: 0.5 mg/dL (ref 0.2–1.2)
Total Protein: 6.4 g/dL (ref 6.0–8.3)

## 2024-03-28 ENCOUNTER — Encounter: Payer: Self-pay | Admitting: Physician Assistant

## 2024-03-28 ENCOUNTER — Telehealth: Admitting: Physician Assistant

## 2024-03-28 DIAGNOSIS — R0981 Nasal congestion: Secondary | ICD-10-CM

## 2024-03-28 DIAGNOSIS — J301 Allergic rhinitis due to pollen: Secondary | ICD-10-CM

## 2024-03-28 MED ORDER — PREDNISONE 20 MG PO TABS: 40.0000 mg | ORAL_TABLET | Freq: Every day | ORAL | 0 refills | Status: AC

## 2024-03-28 NOTE — Progress Notes (Signed)
 Virtual Visit via Video Note   I, Lucie Buttner, connected with  Chelsey Jackson  ([id number], 29-Mar-1951) on 03/28/24 at  1:20 PM EST by a video-enabled telemedicine application and verified that I am speaking with the correct person using two identifiers.  Location: Patient: Home Provider: [facility] [place] office   I discussed the limitations of evaluation and management by telemedicine and the availability of in person appointments. The patient expressed understanding and agreed to proceed.    Discussed the use of AI scribe software for clinical note transcription with the patient, who gave verbal consent to proceed.  History of Present Illness   Chelsey Jackson is a 73 year old female who presents with severe head congestion and a persistent cough.  She developed sudden severe head congestion this morning with intense sinus pressure and copious nasal discharge, described as a feeling that her head is full of fluid. The pressure is severe and makes it hard for her to hold her head up.  She has had a persistent cough since before Halloween that began as a throat tickle and has not resolved. She denies shortness of breath, and home oxygen saturation has been 97-98%. She uses Singulair , nasal sprays, and antihistamines per her allergist. She has not used her inhaler because she does not feel short of breath. She denies fever or chills and has not done a recent COVID test.       Problems:  Patient Active Problem List   Diagnosis Date Noted   Adenomatous polyp of transverse colon 12/14/2023   History of colonic polyps 12/14/2023   Rectal polyp 12/14/2023   Complete tear of left rotator cuff 03/29/2022   Biceps tendinitis on left 03/29/2022   Degenerative superior labral 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 (SLAP) tear of left shoulder 03/29/2022   Synovitis of left shoulder 03/29/2022   Family history of heart disease 01/07/2022   Allergic rhinitis 09/12/2020   Allergic  rhinitis due to animal (cat) (dog) hair and dander 09/12/2020   Mild intermittent asthma 09/12/2020   Vasomotor rhinitis 09/12/2020   Hyperlipidemia, mild 05/17/2020   Impingement syndrome of left shoulder 04/03/2020   Urge incontinence 11/05/2018   Diverticulosis of colon without hemorrhage    Primary osteoarthritis of first carpometacarpal joint of left hand 06/22/2018   Chronic pain of left thumb 06/22/2018   History of retinal tear 10/20/2016   Cough 05/02/2016   Low back pain 09/28/2015   Insomnia 08/18/2013   OSA (obstructive sleep apnea) 07/31/2013   Thyroid  nodule, cold 07/15/2012   Anxiety and depression 10/05/2007   Allergic rhinitis due to pollen 10/05/2007   Asthma 10/05/2007    Allergies:  Allergies  Allergen Reactions   Penicillins Anaphylaxis    REACTION: stop breathing   Medications:  Current Outpatient Medications:    BREYNA 80-4.5 MCG/ACT inhaler, Inhale 2 puffs into the lungs as needed., Disp: , Rfl:    buPROPion  (WELLBUTRIN  XL) 300 MG 24 hr tablet, TAKE 1 TABLET BY MOUTH DAILY, Disp: 90 tablet, Rfl: 0   EPINEPHrine  0.3 mg/0.3 mL IJ SOAJ injection, Inject 0.3 mg into the muscle as needed for anaphylaxis. On hand, Disp: , Rfl:    fluticasone (FLONASE) 50 MCG/ACT nasal spray, Place 2 sprays into both nostrils at bedtime., Disp: , Rfl:    hydroxypropyl methylcellulose / hypromellose (ISOPTO TEARS / GONIOVISC) 2.5 % ophthalmic solution, Place 1 drop into both eyes as needed for dry eyes., Disp: , Rfl:    ipratropium (ATROVENT) 0.03 % nasal spray,  Place 2 sprays into both nostrils daily., Disp: , Rfl:    Iron-Vitamin C 65-125 MG TABS, Take 1 tablet by mouth every evening., Disp: , Rfl:    levocetirizine (XYZAL) 5 MG tablet, Take 5 mg by mouth every evening., Disp: , Rfl:    montelukast  (SINGULAIR ) 10 MG tablet, TAKE 1 TABLET BY MOUTH AT BEDTIME, Disp: 90 tablet, Rfl: 1   Multiple Vitamin (MULTIVITAMIN) tablet, Take 1 tablet by mouth daily., Disp: , Rfl:    Oyster  Shell Calcium  500 MG TABS, Take 1 tablet by mouth daily., Disp: , Rfl:    rosuvastatin  (CRESTOR ) 5 MG tablet, TAKE 1 TABLET BY MOUTH DAILY, Disp: 90 tablet, Rfl: 1   SPRAVATO, 84 MG DOSE, 28 MG/DEVICE SOPK, Place 1 spray into the nose 2 (two) times a week. (Patient taking differently: Place 2 sprays into the nose every 14 (fourteen) days.), Disp: , Rfl:    temazepam  (RESTORIL ) 30 MG capsule, Take 1 capsule (30 mg total) by mouth at bedtime., Disp: 90 capsule, Rfl: 1  Observations/Objective: Patient is well-developed, well-nourished in no acute distress.  Resting comfortably  at home.  Head is normocephalic, atraumatic.  No labored breathing.  Speech is clear and coherent with logical content.  Patient is alert and oriented at baseline.   Assessment and Plan    Nasal congestion Severe head congestion with pressure, no fever, chills, or shortness of breath. Oxygen saturation 97-98%. Likely viral URI or allergic rhinitis exacerbation. COVID-19 test not indicated. - Prescribed prednisone  for 5 days. - Advised over-the-counter cough syrup if beneficial. - Continue Singulair , nasal sprays, and antihistamines. - Consider heartburn medication trial if cough persists post-congestion.  Allergic rhinitis Chronic condition managed with Singulair , nasal sprays, and antihistamines. Symptoms may be exacerbated by acute congestion. - Continue current allergy management regimen.       Follow Up Instructions: I discussed the assessment and treatment plan with the patient. The patient was provided an opportunity to ask questions and all were answered. The patient agreed with the plan and demonstrated an understanding of the instructions.  A copy of instructions were sent to the patient via MyChart unless otherwise noted below.   The patient was advised to call back or seek an in-person evaluation if the symptoms worsen or if the condition fails to improve as anticipated.  Lucie Buttner, [place]

## 2024-04-04 ENCOUNTER — Other Ambulatory Visit: Payer: Self-pay | Admitting: Physician Assistant

## 2024-04-25 NOTE — Progress Notes (Signed)
 " Cardiology Office Note:  .    Date:  04/26/2024  ID:  Chelsey Jackson, DOB 1950-05-22, MRN [id number] PCP: Job Lukes, [place]  [facility] Providers Cardiologist:  Shelda Bruckner, MD     History of Present Illness: .    Chelsey Jackson is a 74 y.o. female with a hx of hyperlipidemia, OSA on CPAP, who is seen for follow up. She was initially 09/20/20 seen as a new consult at the request of Job Lukes, [place] for the evaluation and management of family history of cardiovascular disease.   Family history: father had MI, first age late 60s/early 85s. He had survived metastatic testicular cancer in his 13s. Had two more stents put in age late 70s/early 7s, diagnosed with afib at that time. Deceased age 64. Mother had a fever when she was young, told she had MVP but had AVR in her early-mid 42s, passed away from lung cancer age 59. 2 siblings, sister and brother. Brother is 9 years younger, had nuclear stress test that was normal, started on a statin.  Today: Having stabbing pains at the site of her shoulder replacement for the last few days, has upcoming appt to review.  Reviewed her lipids, medications. Doing well with oral appliance for sleep apnea.  [facility] No limitations. Does have stress as husband has upcoming biopsy for prostate cancer.  ROS:  Denies chest pain, shortness of breath at rest or with normal exertion. No PND, orthopnea, LE edema or unexpected weight gain. No syncope or palpitations. ROS otherwise negative except as noted.   Studies Reviewed: SABRA    EKG Interpretation Date/Time:  Tuesday April 26 2024 10:08:22 EST Ventricular Rate:  71 PR Interval:  148 QRS Duration:  88 QT Interval:  384 QTC Calculation: 417 R Axis:   -24  Text Interpretation: Normal sinus rhythm Possible Anterior infarct (cited on or before 15-Dec-2022) When compared with ECG of 15-Dec-2022 09:11, Premature ventricular complexes are  no longer Present Confirmed by Bruckner Shelda [id number]) on 04/26/2024 10:26:15 AM    Physical Exam:    VS:  BP 120/72   Pulse 71   Ht 5' 8 (1.727 m)   Wt 181 lb 9.6 oz (82.4 kg)   LMP  (LMP Unknown)   SpO2 98%   BMI 27.61 kg/m    Wt Readings from Last 3 Encounters:  04/26/24 181 lb 9.6 oz (82.4 kg)  02/25/24 176 lb 4 oz (79.9 kg)  02/15/24 185 lb (83.9 kg)    GEN: Well nourished, well developed in no acute distress HEENT: Normal, moist mucous membranes NECK: No JVD CARDIAC: regular rhythm, normal S1 and S2, no rubs or gallops. No murmur. VASCULAR: Radial and DP pulses 2+ bilaterally. No carotid bruits RESPIRATORY:  Clear to auscultation without rales, wheezing or rhonchi  ABDOMEN: Soft, non-tender, non-distended MUSCULOSKELETAL:  Ambulates independently SKIN: Warm and dry, no edema NEUROLOGIC:  Alert and oriented x 3. No focal neuro deficits noted. PSYCHIATRIC:  Normal affect   ASSESSMENT AND PLAN: .    Family history of heart disease  Mild dyslipidemia, primary prevention -lipids from 03/01/24 show TG 106, HDL 39, LDL 89. LDL goal <100 given primary prevention -tolerating rosuvastatin  without issues, refilled today -reviewed lifestyle recommendations, doing well with this  OSA: on oral appliance   Cardiac risk counseling and prevention recommendations: -recommend heart healthy/Mediterranean diet, with whole grains, fruits, vegetable, fish, lean meats, nuts, and olive oil. Limit salt. -recommend moderate walking,  3-5 times/week for 30-50 minutes each session. Aim for at least 150 minutes.week. Goal should be pace of 3 miles/hours, or walking 1.5 miles in 30 minutes -recommend avoidance of tobacco products. Avoid excess alcohol. -ASCVD risk score: The 10-year ASCVD risk score (Arnett DK, et al., 2019) is: 9.1%   Values used to calculate the score:     Age: 74 years     Clinically relevant sex: Female     Is Non-Hispanic African American: No     Diabetic: No      Tobacco smoker: No     Systolic Blood Pressure: 120 mmHg     Is BP treated: No     HDL Cholesterol: 39.1 mg/dL     Total Cholesterol: 150 mg/dL   Dispo: Follow-up in 1 year, or sooner as needed.  Signed, Shelda Bruckner, MD   "

## 2024-04-26 ENCOUNTER — Ambulatory Visit (INDEPENDENT_AMBULATORY_CARE_PROVIDER_SITE_OTHER): Admitting: Cardiology

## 2024-04-26 ENCOUNTER — Encounter (HOSPITAL_BASED_OUTPATIENT_CLINIC_OR_DEPARTMENT_OTHER): Payer: Self-pay | Admitting: Cardiology

## 2024-04-26 VITALS — BP 120/72 | HR 71 | Ht 68.0 in | Wt 181.6 lb

## 2024-04-26 DIAGNOSIS — G4733 Obstructive sleep apnea (adult) (pediatric): Secondary | ICD-10-CM

## 2024-04-26 DIAGNOSIS — Z7189 Other specified counseling: Secondary | ICD-10-CM | POA: Diagnosis not present

## 2024-04-26 DIAGNOSIS — Z8249 Family history of ischemic heart disease and other diseases of the circulatory system: Secondary | ICD-10-CM | POA: Diagnosis not present

## 2024-04-26 DIAGNOSIS — E785 Hyperlipidemia, unspecified: Secondary | ICD-10-CM | POA: Diagnosis not present

## 2024-04-26 MED ORDER — ROSUVASTATIN CALCIUM 5 MG PO TABS: 5.0000 mg | ORAL_TABLET | Freq: Every day | ORAL | 3 refills | Status: AC

## 2024-04-26 NOTE — Patient Instructions (Signed)
 Medication Instructions:   Your physician recommends that you continue on your current medications as directed. Please refer to the Current Medication list given to you today.  *If you need a refill on your cardiac medications before your next appointment, please call your pharmacy*    Follow-Up: At [facility], you and your health needs are our priority.  As part of our continuing mission to provide you with exceptional heart care, our providers are all part of one team.  This team includes your primary Cardiologist (physician) and Advanced Practice Providers or APPs (Physician Assistants and Nurse Practitioners) who all work together to provide you with the care you need, when you need it.  Your next appointment:   1 year(s)  Provider:   Shelda Bruckner, MD, Rosaline Bane, NP, or Reche Finder, NP

## 2024-08-25 ENCOUNTER — Ambulatory Visit
